# Patient Record
Sex: Female | Born: 1990
Health system: Southern US, Community
[De-identification: ages and names within clinical notes are randomized; demographics above are authoritative.]

## PROBLEM LIST (undated history)

## (undated) DIAGNOSIS — J3089 Other allergic rhinitis: Secondary | ICD-10-CM

## (undated) DIAGNOSIS — G709 Myoneural disorder, unspecified: Secondary | ICD-10-CM

## (undated) DIAGNOSIS — F419 Anxiety disorder, unspecified: Secondary | ICD-10-CM

## (undated) DIAGNOSIS — T7840XA Allergy, unspecified, initial encounter: Secondary | ICD-10-CM

## (undated) DIAGNOSIS — G129 Spinal muscular atrophy, unspecified: Secondary | ICD-10-CM

## (undated) DIAGNOSIS — L219 Seborrheic dermatitis, unspecified: Secondary | ICD-10-CM

## (undated) DIAGNOSIS — J45909 Unspecified asthma, uncomplicated: Secondary | ICD-10-CM

## (undated) DIAGNOSIS — M869 Osteomyelitis, unspecified: Secondary | ICD-10-CM

## (undated) DIAGNOSIS — F321 Major depressive disorder, single episode, moderate: Secondary | ICD-10-CM

## (undated) DIAGNOSIS — K219 Gastro-esophageal reflux disease without esophagitis: Secondary | ICD-10-CM

## (undated) HISTORY — PX: SPINE SURGERY: SHX786

## (undated) HISTORY — DX: Anxiety disorder, unspecified: F41.9

## (undated) HISTORY — DX: Allergy, unspecified, initial encounter: T78.40XA

## (undated) HISTORY — DX: Other allergic rhinitis: J30.89

## (undated) HISTORY — PX: OTHER SURGICAL HISTORY: SHX169

## (undated) HISTORY — DX: Myoneural disorder, unspecified: G70.9

## (undated) HISTORY — DX: Spinal muscular atrophy, unspecified: G12.9

## (undated) HISTORY — DX: Osteomyelitis, unspecified: M86.9

## (undated) HISTORY — PX: BONE BIOPSY: SHX375

## (undated) HISTORY — PX: COSMETIC SURGERY: SHX468

## (undated) HISTORY — DX: Seborrheic dermatitis, unspecified: L21.9

## (undated) HISTORY — DX: Gastro-esophageal reflux disease without esophagitis: K21.9

## (undated) HISTORY — DX: Major depressive disorder, single episode, moderate: F32.1

## (undated) HISTORY — DX: Unspecified asthma, uncomplicated: J45.909

---
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
## 2011-01-14 ENCOUNTER — Encounter: Payer: Self-pay | Admitting: Family Medicine

## 2011-01-23 ENCOUNTER — Other Ambulatory Visit: Payer: Self-pay | Admitting: Family Medicine

## 2011-01-27 ENCOUNTER — Ambulatory Visit: Payer: Medicaid Other | Admitting: Family Medicine

## 2011-02-06 ENCOUNTER — Telehealth: Payer: Self-pay

## 2011-02-06 ENCOUNTER — Telehealth: Payer: Self-pay | Admitting: Family Medicine

## 2011-02-06 NOTE — Telephone Encounter (Signed)
Advised urgent care. She didn't want to take her there but I told her that didn't need to wait for until next week. Either urgent care or ER. Patients mother understands

## 2011-02-09 ENCOUNTER — Ambulatory Visit (INDEPENDENT_AMBULATORY_CARE_PROVIDER_SITE_OTHER): Payer: Medicaid Other | Admitting: Family Medicine

## 2011-02-09 ENCOUNTER — Encounter: Payer: Self-pay | Admitting: Family Medicine

## 2011-02-09 VITALS — BP 110/80 | HR 107 | Temp 99.4°F | Resp 16

## 2011-02-09 DIAGNOSIS — J209 Acute bronchitis, unspecified: Secondary | ICD-10-CM

## 2011-02-09 DIAGNOSIS — J019 Acute sinusitis, unspecified: Secondary | ICD-10-CM

## 2011-02-09 DIAGNOSIS — H6690 Otitis media, unspecified, unspecified ear: Secondary | ICD-10-CM | POA: Insufficient documentation

## 2011-02-09 DIAGNOSIS — J0100 Acute maxillary sinusitis, unspecified: Secondary | ICD-10-CM | POA: Insufficient documentation

## 2011-02-09 DIAGNOSIS — J029 Acute pharyngitis, unspecified: Secondary | ICD-10-CM

## 2011-02-09 DIAGNOSIS — R51 Headache: Secondary | ICD-10-CM

## 2011-02-09 LAB — POCT RAPID STREP A (OFFICE): Rapid Strep A Screen: NEGATIVE

## 2011-02-09 MED ORDER — KETOROLAC TROMETHAMINE 60 MG/2ML IM SOLN
60.0000 mg | Freq: Once | INTRAMUSCULAR | Status: AC
  Administered 2011-02-09: 60 mg via INTRAMUSCULAR

## 2011-02-09 MED ORDER — PENICILLIN V POTASSIUM 500 MG PO TABS: 500.0000 mg | ORAL_TABLET | Freq: Three times a day (TID) | ORAL | Status: AC

## 2011-02-09 MED ORDER — IBUPROFEN 600 MG PO TABS: 600.0000 mg | ORAL_TABLET | Freq: Four times a day (QID) | ORAL | Status: AC | PRN

## 2011-02-09 NOTE — Progress Notes (Signed)
  Subjective:    Patient ID: Janet Acevedo, female    DOB: 01-24-1991, 20 y.o.   MRN: [id number]  HPI 1 week h/o head and chest congestion, strep exposure at home , and multiple other family members have been ill also Right ear pain , greater than left, reports yellow nasal drainage and yellow sputum. Reports generalized body aches, headache , chills and intermittent fever.   Review of Systems Denies chest pains, palpitations,and leg swelling Denies abdominal pain, nausea, vomiting,diarrhea or constipation.  t. Denies dysuria, frequency, hesitancy or incontinence. Generalized  joint pain, sand muscle aches. 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 Denies seizure, numbness, or tingling. Denies depression, anxiety or insomnia. Denies skin break down or rash.        Objective:   Physical Exam    Patient alert and oriented and in no Cardiopulmonary distress.Ill appearing  HEENT: No facial asymmetry, EOMI, frontal and maxillary sinus tenderness, TM's erythematous on right , Oropharynx  Erythematous and moist.  Neck supple , bilateral ant cervical adenopathy.  Chest: decreased air entry, scattered crackles and few wheezes  CVS: S1, S2 no murmurs, no S3.  ABD: Soft non tender. Bowel sounds normal.  Ext: No edema  MS: decreased ROM spine, shoulders, hips and knees.  Skin: Intact, no ulcerations or rash noted.  .     Assessment & Plan:

## 2011-02-09 NOTE — Patient Instructions (Addendum)
F/U   In  6 weeks  You have antibiotics sent in for sinusitis and bronchitis. Please take the entire course, call if you have problems  Take sudafed one twice daily for the next 5 days, also  ibufrofen 600mg  one 3 times daily as needed for headache and ear pain. Toradol injection today

## 2011-02-09 NOTE — Telephone Encounter (Signed)
Work in appt this pm pls before 3:30 if possible

## 2011-02-14 NOTE — Assessment & Plan Note (Signed)
Antibiotics prescribed, and toradol ordered for headache and ear pain

## 2011-02-14 NOTE — Assessment & Plan Note (Signed)
Pt with acute ear infection, toradol for pain and antibiotic course to be taken in it's entirety, decongestants as needed

## 2011-02-14 NOTE — Assessment & Plan Note (Signed)
Antibiotics prescribed 

## 2011-02-16 ENCOUNTER — Other Ambulatory Visit: Payer: Self-pay | Admitting: Family Medicine

## 2011-03-17 ENCOUNTER — Other Ambulatory Visit: Payer: Self-pay | Admitting: Family Medicine

## 2011-03-17 ENCOUNTER — Telehealth: Payer: Self-pay | Admitting: Family Medicine

## 2011-03-17 DIAGNOSIS — M6250 Muscle wasting and atrophy, not elsewhere classified, unspecified site: Secondary | ICD-10-CM

## 2011-03-17 NOTE — Telephone Encounter (Signed)
Called back to get more info but she wasn't home. Asking for rx for AFO braces (ankle foot orthotic) for Janet Acevedo

## 2011-03-17 NOTE — Telephone Encounter (Signed)
This pt needs ortho referral for foot brace to be ordered, I do not order, pls refer to dr Romeo Apple, thanks, let her know I tried to call and tell her this

## 2011-03-20 ENCOUNTER — Telehealth: Payer: Self-pay | Admitting: Family Medicine

## 2011-03-20 NOTE — Telephone Encounter (Signed)
Script written , mother aware  And will collect

## 2011-03-23 ENCOUNTER — Other Ambulatory Visit: Payer: Self-pay | Admitting: Family Medicine

## 2011-03-25 ENCOUNTER — Ambulatory Visit: Payer: Medicaid Other | Admitting: Family Medicine

## 2011-03-27 ENCOUNTER — Encounter: Payer: Self-pay | Admitting: Family Medicine

## 2011-04-02 ENCOUNTER — Encounter: Payer: Self-pay | Admitting: Family Medicine

## 2011-04-03 ENCOUNTER — Ambulatory Visit (INDEPENDENT_AMBULATORY_CARE_PROVIDER_SITE_OTHER): Payer: Medicaid Other | Admitting: Family Medicine

## 2011-04-03 ENCOUNTER — Encounter: Payer: Self-pay | Admitting: Family Medicine

## 2011-04-03 VITALS — BP 120/80 | HR 85 | Resp 16

## 2011-04-03 DIAGNOSIS — G129 Spinal muscular atrophy, unspecified: Secondary | ICD-10-CM

## 2011-04-03 DIAGNOSIS — Z23 Encounter for immunization: Secondary | ICD-10-CM

## 2011-04-03 DIAGNOSIS — E669 Obesity, unspecified: Secondary | ICD-10-CM

## 2011-04-03 DIAGNOSIS — K219 Gastro-esophageal reflux disease without esophagitis: Secondary | ICD-10-CM

## 2011-04-03 DIAGNOSIS — G128 Other spinal muscular atrophies and related syndromes: Secondary | ICD-10-CM

## 2011-04-03 DIAGNOSIS — J45909 Unspecified asthma, uncomplicated: Secondary | ICD-10-CM

## 2011-04-03 MED ORDER — INFLUENZA VAC TYPES A & B PF IM SUSP: 0.5000 mL | Freq: Once | INTRAMUSCULAR | Status: DC

## 2011-04-03 NOTE — Assessment & Plan Note (Signed)
Unchanged, wheelchair dependent.Followed at [facility] in the neurology clinic

## 2011-04-03 NOTE — Assessment & Plan Note (Signed)
Controlled, no change in medication  

## 2011-04-03 NOTE — Progress Notes (Signed)
  Subjective:    Patient ID: Janet Acevedo, female    DOB: 1991/07/04, 20 y.o.   MRN: [id number]  HPI The PT is here for follow up and re-evaluation of chronic medical conditions, medication management and review of any available recent lab and radiology data.  Preventive health is updated, specifically  Cancer screening and Immunization.   Questions or concerns regarding consultations or procedures which the PT has had in the interim are  addressed. The PT denies any adverse reactions to current medications since the last visit.  There are no new concerns.  There are no specific complaints. She has not changed her diet and still is not eating vegetables as she has been advised       Review of Systems Denies recent fever or chills. Denies sinus pressure, nasal congestion, ear pain or sore throat. Denies chest congestion, productive cough or wheezing. Denies chest pains, palpitations and leg swelling Denies abdominal pain, nausea, vomiting,diarrhea or constipation.   Denies dysuria, frequency, hesitancy or incontinence. Denies joint pain,severe  limitation in mobility from genetic disease Denies headaches, seizures, numbness, or tingling. Denies depression, anxiety or insomnia. Denies skin break down or rash.        Objective:   Physical Exam Patient alert and oriented and in no cardiopulmonary distress.  HEENT: No facial asymmetry, EOMI, no sinus tenderness,  oropharynx pink and moist.  Neck supple no adenopathy.  Chest: Clear to auscultation bilaterally.  CVS: S1, S2 no murmurs, no S3.  ABD: Soft non tender. Bowel sounds normal.  Ext: No edema  MS: decreased  ROM spine, shoulders, hips and knees.  Skin: Intact, no ulcerations or rash noted.  Psych: Good eye contact, normal affect. Memory intact not anxious or depressed appearing.  CNS: CN 2-12 intact,sensation normal throughout.Reduced power in all extremities        Assessment & Plan:

## 2011-04-03 NOTE — Patient Instructions (Addendum)
F/U in 3.5 months   Labs as soon as possible  Flu vaccine today.  Pls eat more fruit, vegetable and drink water, remember you need to lose weight for improved health

## 2011-04-03 NOTE — Assessment & Plan Note (Signed)
Unable to weight pt, however counseled again re need to change diet to facilitate weight loss

## 2011-04-16 ENCOUNTER — Other Ambulatory Visit: Payer: Self-pay | Admitting: Family Medicine

## 2011-04-22 ENCOUNTER — Other Ambulatory Visit: Payer: Self-pay | Admitting: Family Medicine

## 2011-04-22 ENCOUNTER — Telehealth: Payer: Self-pay | Admitting: Family Medicine

## 2011-04-22 MED ORDER — AZELASTINE HCL 0.15 % NA SOLN: 2.0000 | Freq: Two times a day (BID) | NASAL | Status: DC

## 2011-04-22 NOTE — Telephone Encounter (Signed)
Her mother aware fexofenadine and astelin sent in

## 2011-04-22 NOTE — Telephone Encounter (Signed)
astelin has been prescribed, pls let pt know

## 2011-04-23 ENCOUNTER — Other Ambulatory Visit: Payer: Self-pay | Admitting: Family Medicine

## 2011-04-23 ENCOUNTER — Telehealth: Payer: Self-pay

## 2011-04-23 MED ORDER — OLOPATADINE HCL 0.6 % NA SOLN: NASAL | Status: DC

## 2011-04-23 NOTE — Telephone Encounter (Signed)
patanase is sent i please let her know. Also let her know for astelin, she needs to hold down her head and spray towrds the ear, or else she will gag

## 2011-04-24 NOTE — Telephone Encounter (Signed)
Patient aware.

## 2011-05-12 ENCOUNTER — Telehealth: Payer: Self-pay | Admitting: Family Medicine

## 2011-05-14 ENCOUNTER — Telehealth: Payer: Self-pay | Admitting: Family Medicine

## 2011-05-14 NOTE — Telephone Encounter (Signed)
Previous message on this patient regarding same thing

## 2011-05-14 NOTE — Telephone Encounter (Signed)
[facility] since she has tried the alternatives and wants to try PA again

## 2011-05-15 NOTE — Telephone Encounter (Signed)
Med approved. Mother aware

## 2011-05-19 ENCOUNTER — Telehealth: Payer: Self-pay | Admitting: Family Medicine

## 2011-05-19 NOTE — Telephone Encounter (Signed)
Please have patient schedule appt.

## 2011-05-20 ENCOUNTER — Telehealth: Payer: Self-pay | Admitting: Family Medicine

## 2011-05-20 NOTE — Telephone Encounter (Signed)
Called pt and she wants to talk with dr. Lodema Hong

## 2011-05-20 NOTE — Telephone Encounter (Signed)
4 day h/o had and chest congestion.  Pls work in pt on 10/25 somewhere, thanks, let Mother know

## 2011-05-21 ENCOUNTER — Encounter: Payer: Self-pay | Admitting: Family Medicine

## 2011-05-21 ENCOUNTER — Ambulatory Visit (INDEPENDENT_AMBULATORY_CARE_PROVIDER_SITE_OTHER): Payer: Medicaid Other | Admitting: Family Medicine

## 2011-05-21 VITALS — BP 110/80 | HR 84 | Temp 99.5°F | Resp 16

## 2011-05-21 DIAGNOSIS — J309 Allergic rhinitis, unspecified: Secondary | ICD-10-CM

## 2011-05-21 DIAGNOSIS — K219 Gastro-esophageal reflux disease without esophagitis: Secondary | ICD-10-CM

## 2011-05-21 DIAGNOSIS — J019 Acute sinusitis, unspecified: Secondary | ICD-10-CM

## 2011-05-21 DIAGNOSIS — J45909 Unspecified asthma, uncomplicated: Secondary | ICD-10-CM

## 2011-05-21 MED ORDER — SULFAMETHOXAZOLE-TRIMETHOPRIM 800-160 MG PO TABS: 1.0000 | ORAL_TABLET | Freq: Two times a day (BID) | ORAL | Status: AC

## 2011-05-21 NOTE — Patient Instructions (Signed)
F/U as before.  You have septra and tessalon perles sent to your pharmacy for sinusitis.  No other med changes

## 2011-05-21 NOTE — Progress Notes (Signed)
  Subjective:    Patient ID: Janet Acevedo, female    DOB: 02-05-1991, 20 y.o.   MRN: [id number]  HPI 4 day h/o increased nasal congestion, chills, yellow drainage, no documented fever. Denies productive cough. Allergies and GERD are currently controlled.  Review of Systems See HPI  Denies chest congestion, productive cough or wheezing. Denies chest pains, palpitations and leg swelling Denies abdominal pain, nausea, vomiting,diarrhea or constipation.   Denies dysuria, frequency, hesitancy or incontinence.  Denies headaches, seizures, numbness, or tingling. Denies depression, anxiety or insomnia. Denies skin break down or rash.        Objective:   Physical Exam Patient alert and oriented and in no cardiopulmonary distress.  HEENT: No facial asymmetry, EOMI, frontal and maxillarly sinus tenderness,  oropharynx pink and moist.  Neck supple no adenopathy.  Chest: Clear to auscultation bilaterally.decreased air entry throughout  CVS: S1, S2 no murmurs, no S3.  ABD: Soft non tender. Bowel sounds normal.  Ext: No edema  MS: decreased  ROM spine, shoulders, hips and knees.  Skin: Intact, no ulcerations or rash noted.  Psych: Good eye contact, normal affect. Memory intact not anxious or depressed appearing.  CNS: CN 2-12 intact, power, tone and sensation normal throughout.        Assessment & Plan:

## 2011-05-21 NOTE — Telephone Encounter (Signed)
Ov today.

## 2011-05-24 NOTE — Assessment & Plan Note (Signed)
Controlled, no change in medication  

## 2011-05-24 NOTE — Assessment & Plan Note (Signed)
Stable  At this time

## 2011-05-24 NOTE — Assessment & Plan Note (Signed)
Antibiotic prscribed

## 2011-07-02 ENCOUNTER — Telehealth: Payer: Self-pay | Admitting: Family Medicine

## 2011-07-03 ENCOUNTER — Other Ambulatory Visit: Payer: Self-pay | Admitting: Family Medicine

## 2011-07-03 MED ORDER — CETIRIZINE HCL 10 MG PO CHEW: 10.0000 mg | CHEWABLE_TABLET | Freq: Every day | ORAL | Status: DC

## 2011-07-03 NOTE — Telephone Encounter (Signed)
Called mother and she stated that is is the Allegra that is not covered and that she has been on both Claritin and Zyrtec in the past and they did not work.  Please advise.

## 2011-07-03 NOTE — Telephone Encounter (Signed)
Send in for singulair one daily if her mother agrees to try this and see if the ins will cover #30- refill 3

## 2011-07-03 NOTE — Telephone Encounter (Signed)
Spoke with mother and she stated that she would just by the Allegra.

## 2011-07-03 NOTE — Telephone Encounter (Signed)
If not cobering singulair , most affordable option is to pay out of pocket for prescribed loratidine (claritin) or zyrtec (certrizine) this is $4 for 30 days , send whichever she choses please each is a 10mg  tablet #30 refill 3

## 2011-07-06 ENCOUNTER — Other Ambulatory Visit: Payer: Self-pay | Admitting: Family Medicine

## 2011-07-27 ENCOUNTER — Encounter: Payer: Self-pay | Admitting: Family Medicine

## 2011-08-06 ENCOUNTER — Encounter: Payer: Self-pay | Admitting: Family Medicine

## 2011-08-10 ENCOUNTER — Encounter: Payer: Self-pay | Admitting: Family Medicine

## 2011-08-10 ENCOUNTER — Ambulatory Visit (INDEPENDENT_AMBULATORY_CARE_PROVIDER_SITE_OTHER): Payer: Medicaid Other | Admitting: Family Medicine

## 2011-08-10 VITALS — BP 112/80 | HR 83 | Resp 16

## 2011-08-10 DIAGNOSIS — E559 Vitamin D deficiency, unspecified: Secondary | ICD-10-CM

## 2011-08-10 DIAGNOSIS — J45909 Unspecified asthma, uncomplicated: Secondary | ICD-10-CM

## 2011-08-10 DIAGNOSIS — J309 Allergic rhinitis, unspecified: Secondary | ICD-10-CM

## 2011-08-10 DIAGNOSIS — R7301 Impaired fasting glucose: Secondary | ICD-10-CM

## 2011-08-10 DIAGNOSIS — K219 Gastro-esophageal reflux disease without esophagitis: Secondary | ICD-10-CM

## 2011-08-10 DIAGNOSIS — E669 Obesity, unspecified: Secondary | ICD-10-CM

## 2011-08-10 DIAGNOSIS — Z13220 Encounter for screening for lipoid disorders: Secondary | ICD-10-CM

## 2011-08-10 DIAGNOSIS — R5381 Other malaise: Secondary | ICD-10-CM

## 2011-08-10 DIAGNOSIS — J302 Other seasonal allergic rhinitis: Secondary | ICD-10-CM

## 2011-08-10 DIAGNOSIS — G128 Other spinal muscular atrophies and related syndromes: Secondary | ICD-10-CM

## 2011-08-10 NOTE — Assessment & Plan Note (Signed)
Controlled, no change in medication  

## 2011-08-10 NOTE — Patient Instructions (Signed)
F/u in 3.5 months.  Call if you need me  Labs today.  Pls work on changing foood choices   More vegetable and fruit.   No changes in your medications.  I am happy that you are doing well and that you are back in school   All the Best!!!

## 2011-08-10 NOTE — Assessment & Plan Note (Signed)
Unchanged, marked limitation in power of all 4 extremities

## 2011-08-10 NOTE — Assessment & Plan Note (Signed)
Stable and controlled , no recent flare 

## 2011-08-10 NOTE — Assessment & Plan Note (Signed)
Unchanged, counseled to reduce fast foods and unhealthy eating to help with weight control and improved health

## 2011-08-10 NOTE — Progress Notes (Signed)
  Subjective:    Patient ID: Janet Acevedo, female    DOB: 01/10/1991, 21 y.o.   MRN: [id number]  HPI The PT is here for follow up and re-evaluation of chronic medical conditions, medication management and review of any available recent lab and radiology data.  Preventive health is updated, specifically  Cancer screening and Immunization.   Questions or concerns regarding consultations or procedures which the PT has had in the interim are  addressed. The PT denies any adverse reactions to current medications since the last visit.  There are no new concerns.  There are no specific complaints .States she is doing well overall. Did not attend school last semester by choice, her aunt was very ill at the time      Review of Systems See HPI Denies recent fever or chills. Denies sinus pressure, nasal congestion, ear pain or sore throat. Denies chest congestion, productive cough or wheezing. Denies chest pains, palpitations and leg swelling Denies abdominal pain, nausea, vomiting,diarrhea or constipation.   Denies dysuria, frequency, hesitancy or incontinence. Denies headaches, seizures, numbness, or tingling. Denies depression, anxiety or insomnia. Denies skin break down or rash.        Objective:   Physical Exam Patient alert and oriented and in no cardiopulmonary distress.Wheelchair restricted  HEENT: No facial asymmetry, EOMI, no sinus tenderness,  oropharynx pink and moist.  Neck decreased ROM, no adenopathy.  Chest: Clear to auscultation bilaterally.  CVS: S1, S2 no murmurs, no S3.  ABD: Soft non tender. Bowel sounds normal.  Ext: No edema  MS: decreased  ROM spine, shoulders, hips and knees.  Skin: Intact, no ulcerations or rash noted.  Psych: Good eye contact, normal affect. Memory intact not anxious or depressed appearing.  CNS: CN 2-12 intact, Reduced power in upper and lower extremities due to muscular atrophy      Assessment & Plan:

## 2011-08-11 LAB — CBC WITH DIFFERENTIAL/PLATELET
Basophils Relative: 0 % (ref 0–1)
Eosinophils Absolute: 0.3 10*3/uL (ref 0.0–0.7)
Eosinophils Relative: 3 % (ref 0–5)
MCH: 24.6 pg — ABNORMAL LOW (ref 26.0–34.0)
MCHC: 29.9 g/dL — ABNORMAL LOW (ref 30.0–36.0)
MCV: 82.2 fL (ref 78.0–100.0)
Monocytes Relative: 6 % (ref 3–12)
Neutrophils Relative %: 60 % (ref 43–77)
Platelets: 306 10*3/uL (ref 150–400)

## 2011-08-11 LAB — BASIC METABOLIC PANEL
BUN: 9 mg/dL (ref 6–23)
Calcium: 9.2 mg/dL (ref 8.4–10.5)
Glucose, Bld: 80 mg/dL (ref 70–99)
Potassium: 4 mEq/L (ref 3.5–5.3)

## 2011-08-11 LAB — LIPID PANEL
HDL: 34 mg/dL — ABNORMAL LOW (ref >39)
LDL Cholesterol: 127 mg/dL — ABNORMAL HIGH (ref 0–99)
VLDL: 12 mg/dL (ref 0–40)

## 2011-08-11 LAB — TSH: TSH: 1.108 u[IU]/mL (ref 0.350–4.500)

## 2011-08-11 LAB — IRON: Iron: 27 ug/dL — ABNORMAL LOW (ref 42–145)

## 2011-08-12 ENCOUNTER — Other Ambulatory Visit: Payer: Self-pay | Admitting: Family Medicine

## 2011-08-12 ENCOUNTER — Telehealth: Payer: Self-pay | Admitting: Family Medicine

## 2011-08-12 NOTE — Telephone Encounter (Signed)
Unsure what this is ask her to have the pharmacy send for a refill , so I can get clarification, I will refill

## 2011-08-12 NOTE — Telephone Encounter (Signed)
Called and spoke with Dorathy Kinsman and she stated that she would have pharmacy to send refill request

## 2011-08-12 NOTE — Telephone Encounter (Signed)
Mother called and wants to know if you will refill Bepreve for Janet Acevedo.  The med was prescribed by Dr. Willa Rough but the mother wants to know if you will refill.

## 2011-08-24 ENCOUNTER — Other Ambulatory Visit: Payer: Self-pay | Admitting: Family Medicine

## 2011-08-26 ENCOUNTER — Telehealth: Payer: Self-pay

## 2011-08-26 ENCOUNTER — Other Ambulatory Visit: Payer: Self-pay | Admitting: Family Medicine

## 2011-08-26 MED ORDER — AZITHROMYCIN 250 MG PO TABS: ORAL_TABLET | ORAL | Status: AC

## 2011-08-26 MED ORDER — BENZONATATE 100 MG PO CAPS: 100.0000 mg | ORAL_CAPSULE | Freq: Four times a day (QID) | ORAL | Status: DC | PRN

## 2011-08-26 NOTE — Telephone Encounter (Signed)
Tessalon perles and z pack sent to pharmacy, Mom aware

## 2011-08-26 NOTE — Telephone Encounter (Signed)
Now Helena is sick. Started feeling bad Saturday, nasal congestion, cough, itchy eyes and throat, no fever, some wheezig at night and some yellow phlegm production. Aemilia Dedrick was just there

## 2011-08-29 ENCOUNTER — Telehealth: Payer: Self-pay | Admitting: Family Medicine

## 2011-08-29 MED ORDER — PREDNISONE 20 MG PO TABS: ORAL_TABLET | ORAL | Status: DC

## 2011-08-29 NOTE — Telephone Encounter (Signed)
I spoke with Ms. Janet Acevedo's mother Seward Grater. Janet Acevedo is being treated for URI and asthma, started on Z pak 2 days ago. She has more wheezing and cough. Her breathing is okay. She asked for prednisone. As she was recently treated, no red flags per mother, she is otherwise stable. Prednisone 40mg  x 5 days called into pharmacy.

## 2011-09-03 ENCOUNTER — Telehealth: Payer: Self-pay | Admitting: Family Medicine

## 2011-09-03 MED ORDER — PREDNISONE (PAK) 5 MG PO TABS: 5.0000 mg | ORAL_TABLET | ORAL | Status: DC

## 2011-09-03 NOTE — Telephone Encounter (Signed)
Still reports a lot of ear pressure and nasal drainage, no color or fever or chills will send in a dose [pack, she is aware

## 2011-09-08 ENCOUNTER — Other Ambulatory Visit: Payer: Self-pay | Admitting: Family Medicine

## 2011-09-25 ENCOUNTER — Ambulatory Visit (INDEPENDENT_AMBULATORY_CARE_PROVIDER_SITE_OTHER): Payer: Medicaid Other | Admitting: Family Medicine

## 2011-09-25 ENCOUNTER — Other Ambulatory Visit: Payer: Self-pay | Admitting: Family Medicine

## 2011-09-25 ENCOUNTER — Encounter: Payer: Self-pay | Admitting: Family Medicine

## 2011-09-25 VITALS — BP 114/80 | HR 113 | Temp 101.2°F | Resp 16

## 2011-09-25 DIAGNOSIS — J45909 Unspecified asthma, uncomplicated: Secondary | ICD-10-CM

## 2011-09-25 DIAGNOSIS — Z3049 Encounter for surveillance of other contraceptives: Secondary | ICD-10-CM

## 2011-09-25 DIAGNOSIS — J209 Acute bronchitis, unspecified: Secondary | ICD-10-CM

## 2011-09-25 DIAGNOSIS — J111 Influenza due to unidentified influenza virus with other respiratory manifestations: Secondary | ICD-10-CM

## 2011-09-25 MED ORDER — CEFTRIAXONE SODIUM 500 MG IJ SOLR
500.0000 mg | Freq: Once | INTRAMUSCULAR | Status: AC
  Administered 2011-09-25: 500 mg via INTRAMUSCULAR

## 2011-09-25 MED ORDER — OSELTAMIVIR PHOSPHATE 75 MG PO CAPS: 75.0000 mg | ORAL_CAPSULE | Freq: Two times a day (BID) | ORAL | Status: AC

## 2011-09-25 MED ORDER — AZITHROMYCIN 250 MG PO TABS: ORAL_TABLET | ORAL | Status: AC

## 2011-09-25 MED ORDER — METHYLPREDNISOLONE ACETATE PF 80 MG/ML IJ SUSP
80.0000 mg | Freq: Once | INTRAMUSCULAR | Status: AC
  Administered 2011-09-25: 80 mg via INTRAMUSCULAR

## 2011-09-25 MED ORDER — PREDNISONE (PAK) 5 MG PO TABS: 5.0000 mg | ORAL_TABLET | ORAL | Status: DC

## 2011-09-25 NOTE — Progress Notes (Signed)
Addended by: Abner Greenspan on: 09/25/2011 01:04 PM   Modules accepted: Orders

## 2011-09-25 NOTE — Progress Notes (Signed)
  Subjective:    Patient ID: Janet Acevedo, female    DOB: June 22, 1991, 21 y.o.   MRN: [id number]  HPI 2 day h/o acute fever, chills, body aches , sore throat and increased  Chest congestion with wheezing and cough.Mother had to do a breathing treatment yesterday twice Increased nasal drainage and sinus pressure, drainage is clear   Review of Systems See HPI Denies chest pains, palpitations and leg swelling Denies abdominal pain, nausea, vomiting,diarrhea or constipation.   Denies dysuria, frequency, hesitancy or incontinence. Denies joint pain, swelling and limitation in mobility. Denies headaches, seizures, numbness, or tingling. Denies depression, anxiety or insomnia. Denies skin break down or rash.        Objective:   Physical Exam Patient alert and oriented and in no cardiopulmonary distress.Ill appearing  HEENT: No facial asymmetry, EOMI, no sinus tenderness,  oropharynx pink and moist.  Neck supple no adenopathy.  Chest: Decreased air entry, scattered crackles and wheezes  CVS: S1, S2 no murmurs, no S3.  ABD: Soft non tender. Bowel sounds normal.  Ext: No edema  MS: decreased  ROM spine, shoulders, hips and knees.  Skin: Intact, no ulcerations or rash noted.  Psych: Good eye contact, normal affect. Memory intact not anxious or depressed appearing.  CNS: CN 2-12 intact,         Assessment & Plan:

## 2011-09-25 NOTE — Patient Instructions (Addendum)
F/u as before.  You are beiong treated for acute bronchitis and influenza.  You will get Rocephin 500mg  and depo medrol 80mg  IM in the office. Tamiflu, a prednisone dose pack and a zpack are sent to your pharmacy.  You will be given handouts on influenza and acute bronchitis.  I hope that you feel better soon, call if not

## 2011-09-25 NOTE — Assessment & Plan Note (Signed)
Rocephin administered and z pack prescribed

## 2011-09-25 NOTE — Assessment & Plan Note (Signed)
Acute influenza, tamiflu presscribed

## 2011-09-25 NOTE — Assessment & Plan Note (Signed)
Increased symptoms, depo medrol in office and prednisone dose pack

## 2011-09-30 ENCOUNTER — Other Ambulatory Visit: Payer: Self-pay | Admitting: Family Medicine

## 2011-09-30 ENCOUNTER — Other Ambulatory Visit: Payer: Self-pay

## 2011-10-01 ENCOUNTER — Other Ambulatory Visit: Payer: Self-pay | Admitting: Family Medicine

## 2011-10-01 ENCOUNTER — Telehealth: Payer: Self-pay | Admitting: Family Medicine

## 2011-10-01 DIAGNOSIS — D72829 Elevated white blood cell count, unspecified: Secondary | ICD-10-CM

## 2011-10-01 DIAGNOSIS — J40 Bronchitis, not specified as acute or chronic: Secondary | ICD-10-CM

## 2011-10-01 DIAGNOSIS — J209 Acute bronchitis, unspecified: Secondary | ICD-10-CM

## 2011-10-01 DIAGNOSIS — R5381 Other malaise: Secondary | ICD-10-CM

## 2011-10-01 MED ORDER — PENICILLIN V POTASSIUM 500 MG PO TABS: 500.0000 mg | ORAL_TABLET | Freq: Three times a day (TID) | ORAL | Status: AC

## 2011-10-01 NOTE — Telephone Encounter (Signed)
Advise penicillin sent in CXR ordered, needs to get today at [facility], also please order cbc and diff and chem 7 , needs this today also if possible and needs to submit sputum for c/s,

## 2011-10-01 NOTE — Telephone Encounter (Signed)
Called patient and left message for them to return call at the office   

## 2011-10-01 NOTE — Telephone Encounter (Signed)
Anything else you want her to do?

## 2011-10-05 NOTE — Telephone Encounter (Signed)
Spoke with mother and Patient on Thursday. Advised of everything. Never came to collect lab or specimen order

## 2011-10-10 ENCOUNTER — Other Ambulatory Visit: Payer: Self-pay | Admitting: Family Medicine

## 2011-10-20 ENCOUNTER — Encounter: Payer: Self-pay | Admitting: Family Medicine

## 2011-10-20 ENCOUNTER — Other Ambulatory Visit: Payer: Self-pay | Admitting: Family Medicine

## 2011-10-20 ENCOUNTER — Ambulatory Visit (HOSPITAL_COMMUNITY)
Admission: RE | Admit: 2011-10-20 | Discharge: 2011-10-20 | Disposition: A | Discharge status: Home / Self Care | Payer: Medicaid Other | Source: Ambulatory Visit | Attending: Family Medicine | Admitting: Family Medicine

## 2011-10-20 ENCOUNTER — Ambulatory Visit (INDEPENDENT_AMBULATORY_CARE_PROVIDER_SITE_OTHER): Payer: Medicaid Other | Admitting: Family Medicine

## 2011-10-20 VITALS — BP 120/82 | HR 126 | Temp 99.6°F | Resp 16

## 2011-10-20 DIAGNOSIS — J40 Bronchitis, not specified as acute or chronic: Secondary | ICD-10-CM

## 2011-10-20 DIAGNOSIS — R0989 Other specified symptoms and signs involving the circulatory and respiratory systems: Secondary | ICD-10-CM | POA: Insufficient documentation

## 2011-10-20 DIAGNOSIS — J45909 Unspecified asthma, uncomplicated: Secondary | ICD-10-CM

## 2011-10-20 DIAGNOSIS — Z981 Arthrodesis status: Secondary | ICD-10-CM | POA: Insufficient documentation

## 2011-10-20 DIAGNOSIS — J209 Acute bronchitis, unspecified: Secondary | ICD-10-CM

## 2011-10-20 DIAGNOSIS — J189 Pneumonia, unspecified organism: Secondary | ICD-10-CM

## 2011-10-20 DIAGNOSIS — M4000 Postural kyphosis, site unspecified: Secondary | ICD-10-CM | POA: Insufficient documentation

## 2011-10-20 MED ORDER — LEVOFLOXACIN 750 MG PO TABS: 750.0000 mg | ORAL_TABLET | Freq: Every day | ORAL | Status: AC

## 2011-10-20 MED ORDER — HYDROCOD POLST-CHLORPHEN POLST 10-8 MG/5ML PO LQCR: 5.0000 mL | Freq: Two times a day (BID) | ORAL | Status: DC

## 2011-10-20 MED ORDER — ONDANSETRON HCL 4 MG PO TABS: 4.0000 mg | ORAL_TABLET | Freq: Three times a day (TID) | ORAL | Status: DC | PRN

## 2011-10-20 NOTE — Patient Instructions (Signed)
I am treating for pneumonia Start the new antibiotics- Levaquin I will send your records to your lung doctor Zofran as needed for nausea Push the fluids Continue your nebs I will call about the chest x-ray

## 2011-10-20 NOTE — Assessment & Plan Note (Addendum)
I am concerned for community-acquired pneumonia with her prolonged symptoms despite antibiotics. She will have blood work obtained. Sputum cultures sent. She had a chest x-ray done earlier today which I will followup on this evening. I will increase her to Levaquin. She has completed course of Tamiflu for influenza. She does not look very toxic-appearing therefore I believe outpatient workup can be done. I will fax a note to her pulmonologist to see if anything needs to be changed.  I did recommend probiotics because of her multiple courses of antibiotics   I spoke with pt mother given x-ray results, I think she should still take the course of antibiotics and we will await the sputum  Culture and labs. She agreed and voiced understanding

## 2011-10-20 NOTE — Assessment & Plan Note (Signed)
She is s/p steroids from March 1st, this does not appear to be asthma exacerbation at this time

## 2011-10-20 NOTE — Progress Notes (Signed)
  Subjective:    Patient ID: Janet Acevedo, female    DOB: Jan 17, 1991, 21 y.o.   MRN: [id number]  HPI Patient presents with cough with production, nasal congestion fever and wheezing. She states that all these symptoms started approximately 3 1/2weeks ago when she was seen by her PCP and prescribe Tamiflu, Rocephin and azithromycin to cover bronchitis and influenza. She did improve however all of her symptoms reoccurred about a week later. She was being given penicillin however this made her nauseous. Her fever was 10 73F this morning. She is able to cough up the sputum however with her muscular atrophy disorder she does have difficulty clearing her secretions. She does have a pulmonologist at [facility] Dr.Carlson. Positive sick contact with her mother who is also had an upper respiratory infection. She has been using her normal pulmonary toilet which is albuterol nebs and Advair along with Singulair.   Review of Systems  GEN- + fatigue, +fever, weight loss,weakness, recent illness HEENT- denies eye drainage, change in vision, nasal discharge, CVS- denies chest pain, palpitations RESP- denies SOB,+ cough, +wheeze ABD- + N/ V, change in stools, abd pain GU- denies dysuria, hematuria, dribbling, incontinence MSK- denies joint pain, +muscle aches, injury Neuro- denies headache, dizziness, syncope, seizure activity       Objective:   Physical Exam GEN- NAD, alert and oriented x3, sitting in wheelchair, short stature, non toxic appearing, afebrile HEENT- PERRL, EOMI, non injected sclera, pink conjunctiva, MMM, oropharynx clear, TM clear bilat , no maxillary sinus pressure Neck- Supple, mild shotty LAD CVS- Tachycardic, no murmur RESP-course Rhonchi in bilat lobes, normal WOB, no wheeze heard, speaking in full sentences, no retractions ABD-NABS,soft, NT,ND EXT- No edema Pulses- Radial, DP- 2+ Neuro-decreased muscular tone, legs in braces with contractures       Assessment &  Plan:

## 2011-10-21 LAB — CBC WITH DIFFERENTIAL/PLATELET
Eosinophils Absolute: 0 10*3/uL (ref 0.0–0.7)
Hemoglobin: 11.6 g/dL — ABNORMAL LOW (ref 12.0–15.0)
Lymphocytes Relative: 2 % — ABNORMAL LOW (ref 12–46)
Lymphs Abs: 0.3 10*3/uL — ABNORMAL LOW (ref 0.7–4.0)
MCH: 25.6 pg — ABNORMAL LOW (ref 26.0–34.0)
MCV: 82.2 fL (ref 78.0–100.0)
Monocytes Relative: 7 % (ref 3–12)
Neutrophils Relative %: 91 % — ABNORMAL HIGH (ref 43–77)
RBC: 4.54 MIL/uL (ref 3.87–5.11)
WBC: 13.9 10*3/uL — ABNORMAL HIGH (ref 4.0–10.5)

## 2011-10-21 LAB — BASIC METABOLIC PANEL
CO2: 18 mEq/L — ABNORMAL LOW (ref 19–32)
Calcium: 9.4 mg/dL (ref 8.4–10.5)
Chloride: 102 mEq/L (ref 96–112)
Glucose, Bld: 96 mg/dL (ref 70–99)
Sodium: 141 mEq/L (ref 135–145)

## 2011-10-24 LAB — RESPIRATORY CULTURE OR RESPIRATORY AND SPUTUM CULTURE

## 2011-10-30 NOTE — Telephone Encounter (Signed)
Addended by: Kandis Fantasia B on: 10/30/2011 03:06 PM   Modules accepted: Orders

## 2011-11-03 ENCOUNTER — Telehealth: Payer: Self-pay | Admitting: Family Medicine

## 2011-11-03 LAB — CBC WITH DIFFERENTIAL/PLATELET
Basophils Relative: 0 % (ref 0–1)
Eosinophils Absolute: 0.6 10*3/uL (ref 0.0–0.7)
HCT: 38.6 % (ref 36.0–46.0)
Hemoglobin: 11.9 g/dL — ABNORMAL LOW (ref 12.0–15.0)
Lymphs Abs: 3.7 10*3/uL (ref 0.7–4.0)
MCH: 25.6 pg — ABNORMAL LOW (ref 26.0–34.0)
MCHC: 30.8 g/dL (ref 30.0–36.0)
Monocytes Absolute: 1.6 10*3/uL — ABNORMAL HIGH (ref 0.1–1.0)
Monocytes Relative: 13 % — ABNORMAL HIGH (ref 3–12)
Neutrophils Relative %: 53 % (ref 43–77)
RBC: 4.65 MIL/uL (ref 3.87–5.11)

## 2011-11-03 NOTE — Telephone Encounter (Signed)
Spoke with mother and she is aware that she should contact the pulmonary specialist for an appointment and that she needs to continue with the followup labs.

## 2011-11-03 NOTE — Telephone Encounter (Signed)
Addended by: Kandis Fantasia B on: 11/03/2011 03:51 PM   Modules accepted: Orders

## 2011-11-05 ENCOUNTER — Telehealth: Payer: Self-pay | Admitting: Family Medicine

## 2011-11-05 MED ORDER — FLUTICASONE-SALMETEROL 115-21 MCG/ACT IN AERO: 2.0000 | INHALATION_SPRAY | Freq: Two times a day (BID) | RESPIRATORY_TRACT | Status: DC

## 2011-11-05 MED ORDER — LEVOFLOXACIN 750 MG PO TABS: 750.0000 mg | ORAL_TABLET | Freq: Every day | ORAL | Status: AC

## 2011-11-05 NOTE — Telephone Encounter (Signed)
I spoke with patient's pulmonologist at [facility] Dr. Lisette Grinder. He advised that we change her inhaled corticosteroid to Advair 115/21 mg MDI with AeroChamber and she's had multiple infections. They do have a followup appointment already scheduled in May  I spoke with patient's mother. She continues to have congestion with production and some sinus drainage. Her fever has now resolved. She does remember when she was this bad before she was on antibiotics for 3 weeks straight for chronic sinusitis. I informedHer of conversation with her pulmonologist. I will switch her to Advair she'll also be given another course of the Levaquin.

## 2011-11-12 ENCOUNTER — Telehealth: Payer: Self-pay | Admitting: Family Medicine

## 2011-11-12 NOTE — Telephone Encounter (Signed)
I spoke with pt Mother, appt rescheduled to April 24th at 11:30am with Dr. Avel Sensor We may need to provide a note for her school for that day

## 2011-11-25 ENCOUNTER — Other Ambulatory Visit: Payer: Self-pay | Admitting: Family Medicine

## 2011-12-03 ENCOUNTER — Telehealth: Payer: Self-pay | Admitting: Family Medicine

## 2011-12-04 NOTE — Telephone Encounter (Signed)
pls find out exactly what she wants therapy for and document, I will refer when I know

## 2011-12-07 ENCOUNTER — Other Ambulatory Visit: Payer: Self-pay | Admitting: Family Medicine

## 2011-12-07 DIAGNOSIS — G129 Spinal muscular atrophy, unspecified: Secondary | ICD-10-CM

## 2011-12-07 MED ORDER — LEVOCETIRIZINE DIHYDROCHLORIDE 5 MG PO TABS: 5.0000 mg | ORAL_TABLET | Freq: Every evening | ORAL | Status: DC

## 2011-12-07 NOTE — Telephone Encounter (Signed)
pls fax xyzal, and f/u with pA

## 2011-12-07 NOTE — Telephone Encounter (Signed)
Faxed in and will do PA when it comes

## 2011-12-07 NOTE — Telephone Encounter (Signed)
She is needing her seating evaluated again by PT Santina Evans ) She is the one who always does this for [place] ALSO- her allergy meds are not helping and she wants to go to xyzal if possible [facility]

## 2011-12-07 NOTE — Telephone Encounter (Signed)
Called patient and left message for them to return call at the office   

## 2011-12-07 NOTE — Telephone Encounter (Signed)
pls refer to PT at [facility], specifically request Janet Acevedo per pt request, thank you

## 2011-12-08 NOTE — Telephone Encounter (Signed)
Form was faxed to [facility] hospital

## 2011-12-09 ENCOUNTER — Ambulatory Visit (INDEPENDENT_AMBULATORY_CARE_PROVIDER_SITE_OTHER): Payer: Medicare Other | Admitting: Family Medicine

## 2011-12-09 VITALS — BP 126/72 | HR 102 | Resp 18

## 2011-12-09 DIAGNOSIS — E669 Obesity, unspecified: Secondary | ICD-10-CM | POA: Diagnosis not present

## 2011-12-09 DIAGNOSIS — G128 Other spinal muscular atrophies and related syndromes: Secondary | ICD-10-CM

## 2011-12-09 DIAGNOSIS — Z13220 Encounter for screening for lipoid disorders: Secondary | ICD-10-CM | POA: Diagnosis not present

## 2011-12-09 DIAGNOSIS — R7301 Impaired fasting glucose: Secondary | ICD-10-CM | POA: Diagnosis not present

## 2011-12-09 DIAGNOSIS — J45909 Unspecified asthma, uncomplicated: Secondary | ICD-10-CM

## 2011-12-09 DIAGNOSIS — B369 Superficial mycosis, unspecified: Secondary | ICD-10-CM

## 2011-12-09 DIAGNOSIS — J309 Allergic rhinitis, unspecified: Secondary | ICD-10-CM

## 2011-12-09 DIAGNOSIS — K219 Gastro-esophageal reflux disease without esophagitis: Secondary | ICD-10-CM

## 2011-12-09 NOTE — Progress Notes (Signed)
  Subjective:    Patient ID: Janet Acevedo, female    DOB: [birth year], 21 y.o.   MRN: [id number]  HPI The PT is here for follow up and re-evaluation of chronic medical conditions, medication management and review of any available recent lab and radiology data.  Preventive health is updated, specifically  Cancer screening and Immunization.   Questions or concerns regarding consultations or procedures which the PT has had in the interim are  addressed. The PT denies any adverse reactions to current medications since the last visit.  There are no new concerns.  There are no specific complaints      Review of Systems See HPI Denies recent fever or chills. Denies sinus pressure, nasal congestion, ear pain or sore throat. Denies chest congestion, productive cough or wheezing. Denies chest pains, palpitations and leg swelling Denies abdominal pain, nausea, vomiting,diarrhea or constipation.   Denies dysuria, frequency, hesitancy or incontinence. Denies joint pain, chronic limitation  in mobility. Denies headaches, seizures, numbness, or tingling. Denies depression, anxiety or insomnia. Denies skin break down or rash.        Objective:   Physical Exam  Patient alert and oriented and in no cardiopulmonary distress.  HEENT: No facial asymmetry, EOMI, no sinus tenderness,  oropharynx pink and moist.  Neck decreased ROM, secondary to muscle weakness no adenopathy.  Chest: Clear to auscultation bilaterally.Decreased breath sounds, though adequate, no crackles  CVS: S1, S2 no murmurs, no S3.  ABD: Soft non tender. Bowel sounds normal.Obese  Ext: No edema  MS: decreased ROM spine, shoulders, hips and knees.  Skin: Intact, no ulcerations or rash noted.  Psych: Good eye contact, normal affect. Memory intact not anxious or depressed appearing.  CNS: CN 2-12 intact reduced  Power throughout, tone reduced  Throughout.Sensation normal       Assessment & Plan:

## 2011-12-09 NOTE — Patient Instructions (Addendum)
F/u in August.  Please call if you need me before.   Fasting lipid , chem 7 ,HBA1c  Please maks Make  changes in eating so you improve your health

## 2011-12-10 ENCOUNTER — Encounter: Payer: Self-pay | Admitting: Family Medicine

## 2011-12-10 NOTE — Assessment & Plan Note (Signed)
No current flare at this time reportedly

## 2011-12-10 NOTE — Assessment & Plan Note (Signed)
Controlled with current med requires xyzal

## 2011-12-10 NOTE — Assessment & Plan Note (Signed)
Deteriorated. Patient re-educated about  the importance of commitment to a  minimum of 150 minutes of exercise per week. The importance of healthy food choices with portion control discussed. Encouraged to start a food diary, count calories and to consider  joining a support group. Sample diet sheets offered. Goals set by the patient for the next several months.    

## 2011-12-10 NOTE — Assessment & Plan Note (Signed)
Unchanged , has upcoming PT eval to adjust seat on her mobility equipment

## 2011-12-10 NOTE — Assessment & Plan Note (Addendum)
Currently stable only using advair at this time, recent pulmonary eval at [facility] reportedly revealed improved lung function

## 2011-12-10 NOTE — Assessment & Plan Note (Signed)
Severe has been on daily prevacid for years, controlled on this med

## 2011-12-15 DIAGNOSIS — G129 Spinal muscular atrophy, unspecified: Secondary | ICD-10-CM | POA: Diagnosis not present

## 2011-12-15 DIAGNOSIS — IMO0001 Reserved for inherently not codable concepts without codable children: Secondary | ICD-10-CM | POA: Diagnosis not present

## 2011-12-22 ENCOUNTER — Other Ambulatory Visit: Payer: Self-pay | Admitting: Family Medicine

## 2012-01-04 ENCOUNTER — Telehealth: Payer: Self-pay | Admitting: Family Medicine

## 2012-01-04 NOTE — Telephone Encounter (Signed)
Pt has tried all alternatives listed per mother so send for appeal form pls, she is aware

## 2012-01-04 NOTE — Telephone Encounter (Signed)
We have already done the PA and it was denied. Said that alternatives were: flonase, nasonex, qnasl and triamcinolone acetonide . Patient has called about this several times. What do you want to do? I also have an appeal form if you want to appeal the decision.

## 2012-01-05 NOTE — Telephone Encounter (Signed)
Appeal form filled out for Dr to sign.

## 2012-01-07 ENCOUNTER — Other Ambulatory Visit: Payer: Self-pay | Admitting: Family Medicine

## 2012-02-04 ENCOUNTER — Other Ambulatory Visit: Payer: Self-pay | Admitting: Family Medicine

## 2012-02-10 ENCOUNTER — Telehealth: Payer: Self-pay | Admitting: Family Medicine

## 2012-02-10 NOTE — Telephone Encounter (Signed)
This is one of the least sedating, I advise use every other day and if extra drippy then take 1 sudafed, as needed, this is OTC, otherwise , she may try claritin, which also is OTC. also she needs to make sure she has good sleep habits, going to bed at a set time and getting enough rest, or she will feel tired. No new med to prescribe at this time

## 2012-02-12 ENCOUNTER — Other Ambulatory Visit: Payer: Self-pay

## 2012-02-12 ENCOUNTER — Telehealth: Payer: Self-pay | Admitting: *Deleted

## 2012-02-12 MED ORDER — FLUTICASONE-SALMETEROL 115-21 MCG/ACT IN AERO: 2.0000 | INHALATION_SPRAY | Freq: Two times a day (BID) | RESPIRATORY_TRACT | Status: DC

## 2012-02-12 NOTE — Telephone Encounter (Signed)
Pt aware of response in previous telephone message.

## 2012-02-12 NOTE — Telephone Encounter (Signed)
Pt aware.

## 2012-02-12 NOTE — Telephone Encounter (Signed)
Janet Acevedo would like for the nurse to give her a call at [phone]. She has a question about her allery medication charged, it makes her sleepy.

## 2012-02-15 ENCOUNTER — Telehealth: Payer: Self-pay | Admitting: Family Medicine

## 2012-02-16 ENCOUNTER — Other Ambulatory Visit: Payer: Self-pay | Admitting: Family Medicine

## 2012-02-17 ENCOUNTER — Other Ambulatory Visit: Payer: Self-pay | Admitting: Family Medicine

## 2012-02-17 MED ORDER — PREDNISONE (PAK) 5 MG PO TABS: ORAL_TABLET | ORAL | Status: DC

## 2012-02-17 MED ORDER — LEVALBUTEROL HCL 1.25 MG/3ML IN NEBU: 1.0000 | INHALATION_SOLUTION | Freq: Four times a day (QID) | RESPIRATORY_TRACT | Status: DC | PRN

## 2012-02-17 NOTE — Telephone Encounter (Signed)
No fever or weakness. Will try prednisone for now

## 2012-02-17 NOTE — Telephone Encounter (Signed)
States she has been sick since the weekend. A little wheezing at night but mainly just heaviness in her chest and cough with colored phlegm production. Still want to use the prednisone or send in something different?

## 2012-02-17 NOTE — Telephone Encounter (Signed)
She can start with the prednisone and needs to schedule appt to be seen with colored phlegm if she also has fever and malaise. Sen the pred entered please

## 2012-02-17 NOTE — Telephone Encounter (Signed)
pls document from patient duration of symptoms if sputum production, I will send in a pred dose pack if she is only wheezing (entered historically)

## 2012-03-02 ENCOUNTER — Other Ambulatory Visit: Payer: Self-pay | Admitting: Family Medicine

## 2012-03-03 ENCOUNTER — Telehealth: Payer: Self-pay | Admitting: Family Medicine

## 2012-03-03 NOTE — Telephone Encounter (Signed)
Pharmacy aware we have not been able to get in touch with medicaid

## 2012-03-07 ENCOUNTER — Encounter: Payer: Self-pay | Admitting: Family Medicine

## 2012-03-07 ENCOUNTER — Other Ambulatory Visit: Payer: Self-pay | Admitting: Family Medicine

## 2012-03-07 ENCOUNTER — Ambulatory Visit (INDEPENDENT_AMBULATORY_CARE_PROVIDER_SITE_OTHER): Payer: Medicare Other | Admitting: Family Medicine

## 2012-03-07 VITALS — BP 124/80 | HR 76 | Resp 16

## 2012-03-07 DIAGNOSIS — R7301 Impaired fasting glucose: Secondary | ICD-10-CM | POA: Diagnosis not present

## 2012-03-07 DIAGNOSIS — K219 Gastro-esophageal reflux disease without esophagitis: Secondary | ICD-10-CM | POA: Diagnosis not present

## 2012-03-07 DIAGNOSIS — D72829 Elevated white blood cell count, unspecified: Secondary | ICD-10-CM | POA: Diagnosis not present

## 2012-03-07 DIAGNOSIS — E669 Obesity, unspecified: Secondary | ICD-10-CM | POA: Diagnosis not present

## 2012-03-07 DIAGNOSIS — Z13220 Encounter for screening for lipoid disorders: Secondary | ICD-10-CM | POA: Diagnosis not present

## 2012-03-07 DIAGNOSIS — G128 Other spinal muscular atrophies and related syndromes: Secondary | ICD-10-CM | POA: Diagnosis not present

## 2012-03-07 DIAGNOSIS — J45909 Unspecified asthma, uncomplicated: Secondary | ICD-10-CM | POA: Diagnosis not present

## 2012-03-07 LAB — BASIC METABOLIC PANEL
BUN: 8 mg/dL (ref 6–23)
CO2: 26 mEq/L (ref 19–32)
Calcium: 9.4 mg/dL (ref 8.4–10.5)
Glucose, Bld: 72 mg/dL (ref 70–99)
Potassium: 4.1 mEq/L (ref 3.5–5.3)
Sodium: 140 mEq/L (ref 135–145)

## 2012-03-07 LAB — LIPID PANEL
Cholesterol: 192 mg/dL (ref 0–200)
Total CHOL/HDL Ratio: 6 Ratio
VLDL: 13 mg/dL (ref 0–40)

## 2012-03-07 LAB — HEMOGLOBIN A1C
Hgb A1c MFr Bld: 5.3 % (ref <5.7)
Mean Plasma Glucose: 105 mg/dL (ref <117)

## 2012-03-07 NOTE — Patient Instructions (Addendum)
F/U in month   Please call if you need me before.] We will contact you in the next 2 days regarding the xopenex    Call for /get the flu vaccine in October please.  Medications, as discussed

## 2012-03-08 LAB — CBC WITH DIFFERENTIAL/PLATELET
Eosinophils Absolute: 0.3 10*3/uL (ref 0.0–0.7)
HCT: 37.9 % (ref 36.0–46.0)
Hemoglobin: 11.8 g/dL — ABNORMAL LOW (ref 12.0–15.0)
Lymphs Abs: 3.1 10*3/uL (ref 0.7–4.0)
MCH: 26.1 pg (ref 26.0–34.0)
Monocytes Absolute: 0.6 10*3/uL (ref 0.1–1.0)
Monocytes Relative: 7 % (ref 3–12)
Neutro Abs: 5 10*3/uL (ref 1.7–7.7)
Neutrophils Relative %: 55 % (ref 43–77)
RBC: 4.52 MIL/uL (ref 3.87–5.11)

## 2012-03-11 NOTE — Progress Notes (Signed)
  Subjective:    Patient ID: Janet Acevedo, female    DOB: November 01, 1990, 21 y.o.   MRN: [id number]  HPI The PT is here for follow up and re-evaluation of chronic medical conditions, medication management and review of any available recent lab and radiology data.  Preventive health is updated, specifically  Cancer screening and Immunization.   Questions or concerns regarding consultations or procedures which the PT has had in the interim are  addressed. The PT denies any adverse reactions to current medications since the last visit.  There are no new concerns.  There are no specific complaints       Review of Systems See HPI Denies recent fever or chills. Denies sinus pressure, nasal congestion, ear pain or sore throat. Denies chest congestion, productive cough or wheezing. Denies chest pains, palpitations and leg swelling Denies abdominal pain, nausea, vomiting,diarrhea or constipation.   Denies dysuria, frequency, hesitancy or incontinence. Denies joint pain, swelling  Denies headaches, seizures, numbness, or tingling. Denies depression, anxiety or insomnia. Denies skin break down or rash.        Objective:   Physical Exam Patient alert and oriented and in no cardiopulmonary distress.whheelchair dependent  HEENT: No facial asymmetry, EOMI, no sinus tenderness,  oropharynx pink and moist.  Neck decreased ROM, no  adenopathy.  Chest: Clear to auscultation bilaterally.  CVS: S1, S2 no murmurs, no S3.  ABD: Soft non tender. Bowel sounds normal.  Ext: No edema  MS: decreased  ROM spine, shoulders, hips and knees.  Skin: Intact, no ulcerations or rash noted.  Psych: Good eye contact, normal affect. Memory intact not anxious or depressed appearing.  CNS: CN 2-12 intact, power,diminished in upper and lower extremities, sensation intact        Assessment & Plan:

## 2012-03-11 NOTE — Assessment & Plan Note (Signed)
Reports making duetary changes to facilitate weight loss

## 2012-03-11 NOTE — Assessment & Plan Note (Signed)
controlled , needs xopenex a s albuterol causes palpitations

## 2012-03-11 NOTE — Assessment & Plan Note (Signed)
Controlled, no change in medication  

## 2012-03-11 NOTE — Assessment & Plan Note (Signed)
Wheelchair dependent , non ambulatory, wih generalized muscle weakness, needs assistance with ADL's

## 2012-03-17 ENCOUNTER — Telehealth: Payer: Self-pay | Admitting: Family Medicine

## 2012-03-17 MED ORDER — PANTOPRAZOLE SODIUM 40 MG PO TBEC: 40.0000 mg | DELAYED_RELEASE_TABLET | Freq: Every day | ORAL | Status: DC

## 2012-03-17 NOTE — Telephone Encounter (Signed)
She can try Protonix 40mg  once a day, please let her know this may have to go through insurance as not on preferred drug list. Please ask her all the medications she has tried for her acid reflux

## 2012-03-17 NOTE — Telephone Encounter (Signed)
Is there something stronger that can be sent in with this patient?

## 2012-03-24 ENCOUNTER — Telehealth: Payer: Self-pay | Admitting: Family Medicine

## 2012-03-25 NOTE — Telephone Encounter (Signed)
Patient aware.

## 2012-03-25 NOTE — Telephone Encounter (Signed)
She is aware she is to try the protonix for her reflux

## 2012-03-29 ENCOUNTER — Telehealth: Payer: Self-pay | Admitting: Family Medicine

## 2012-03-29 NOTE — Telephone Encounter (Signed)
Pt has both prevACID AND PROTONIX ON MED LIST. i HAVE A NOTE FROM [facility] ALSO. sHE NEEDS ONLY one OR THE OTHER. pLEASE VERIFY WHICH OF THEM WORKS BETTER FOR HER/WHICH SHE WOULD PREFER, LET ME KNOW SO I can stop the other and also will need to notify the pharmacy when this is done

## 2012-03-30 NOTE — Telephone Encounter (Signed)
She is aware that she can only use one and she is trying the protonix now

## 2012-03-30 NOTE — Telephone Encounter (Signed)
Please specifically ask that she call back next week to let me know which she prefers, I need to resolve this asap

## 2012-04-04 ENCOUNTER — Telehealth: Payer: Self-pay | Admitting: Family Medicine

## 2012-04-04 NOTE — Telephone Encounter (Signed)
Was just needing to know which acid reducer worked best because one has to be d/c'd

## 2012-04-06 NOTE — Telephone Encounter (Signed)
Needs to schedule flu shot. Luann to schedule

## 2012-04-07 ENCOUNTER — Ambulatory Visit (INDEPENDENT_AMBULATORY_CARE_PROVIDER_SITE_OTHER): Payer: Medicare Other

## 2012-04-07 DIAGNOSIS — Z23 Encounter for immunization: Secondary | ICD-10-CM

## 2012-04-07 MED ORDER — ALBUTEROL SULFATE HFA 108 (90 BASE) MCG/ACT IN AERS: 2.0000 | INHALATION_SPRAY | RESPIRATORY_TRACT | Status: DC | PRN

## 2012-04-08 ENCOUNTER — Other Ambulatory Visit: Payer: Self-pay | Admitting: Family Medicine

## 2012-04-08 NOTE — Telephone Encounter (Signed)
pls send stamped discontinue order for prevacid to the pharmacy for this patient

## 2012-04-08 NOTE — Telephone Encounter (Signed)
States she is going to stay on the protonix

## 2012-04-08 NOTE — Telephone Encounter (Signed)
D/c order sent  

## 2012-04-20 ENCOUNTER — Other Ambulatory Visit: Payer: Self-pay | Admitting: Family Medicine

## 2012-04-27 ENCOUNTER — Other Ambulatory Visit: Payer: Self-pay | Admitting: Family Medicine

## 2012-04-28 ENCOUNTER — Other Ambulatory Visit: Payer: Self-pay

## 2012-05-10 DIAGNOSIS — N926 Irregular menstruation, unspecified: Secondary | ICD-10-CM | POA: Diagnosis not present

## 2012-05-18 ENCOUNTER — Telehealth: Payer: Self-pay | Admitting: Family Medicine

## 2012-05-18 DIAGNOSIS — J45909 Unspecified asthma, uncomplicated: Secondary | ICD-10-CM

## 2012-05-18 DIAGNOSIS — R05 Cough: Secondary | ICD-10-CM

## 2012-05-18 DIAGNOSIS — R059 Cough, unspecified: Secondary | ICD-10-CM

## 2012-05-18 NOTE — Telephone Encounter (Signed)
Feeling some pressure on her chest for the past 4 days. Not sick, no drainage or anything, feels fine but feels like something is sitting on her chest on left side. Describes it as a tight feeling. Mother wants to know if you could order a chest xray or if she would have to come in the office

## 2012-05-19 NOTE — Telephone Encounter (Signed)
Patient aware.

## 2012-05-19 NOTE — Telephone Encounter (Signed)
Chest xray  Ordered. We will call with results before sending antibiotics

## 2012-05-20 ENCOUNTER — Ambulatory Visit (HOSPITAL_COMMUNITY)
Admission: RE | Admit: 2012-05-20 | Discharge: 2012-05-20 | Disposition: A | Discharge status: Home / Self Care | Payer: Medicare Other | Source: Ambulatory Visit | Attending: Family Medicine | Admitting: Family Medicine

## 2012-05-20 DIAGNOSIS — J45909 Unspecified asthma, uncomplicated: Secondary | ICD-10-CM | POA: Diagnosis not present

## 2012-05-20 DIAGNOSIS — G7109 Other specified muscular dystrophies: Secondary | ICD-10-CM | POA: Diagnosis not present

## 2012-05-20 DIAGNOSIS — R05 Cough: Secondary | ICD-10-CM | POA: Diagnosis not present

## 2012-05-20 DIAGNOSIS — R059 Cough, unspecified: Secondary | ICD-10-CM | POA: Insufficient documentation

## 2012-05-20 DIAGNOSIS — R0602 Shortness of breath: Secondary | ICD-10-CM | POA: Diagnosis not present

## 2012-05-27 ENCOUNTER — Other Ambulatory Visit: Payer: Self-pay

## 2012-05-27 ENCOUNTER — Telehealth: Payer: Self-pay | Admitting: Family Medicine

## 2012-05-27 ENCOUNTER — Other Ambulatory Visit: Payer: Self-pay | Admitting: Family Medicine

## 2012-05-27 MED ORDER — CROMOLYN SODIUM 4 % OP SOLN: 1.0000 [drp] | Freq: Four times a day (QID) | OPHTHALMIC | Status: DC

## 2012-05-27 NOTE — Telephone Encounter (Signed)
Sent in

## 2012-05-27 NOTE — Telephone Encounter (Signed)
The cromolyn needs to be eye drops. It was nasal spray that was sent. They have opthalmic and nasal

## 2012-05-27 NOTE — Telephone Encounter (Signed)
Eye drops entered historically pls send

## 2012-05-27 NOTE — Telephone Encounter (Signed)
Please let Janet Acevedo know that there has to BE rECENT , in the past 6 month of 2 failed drugs and all the bad side effects documented in her clinuical record before I can request thedrug she wants. I am sending in cromolyn, she needs to fill this and use this, let me know results before I can send for the bepreve

## 2012-05-28 ENCOUNTER — Other Ambulatory Visit: Payer: Self-pay | Admitting: Family Medicine

## 2012-06-01 DIAGNOSIS — J45909 Unspecified asthma, uncomplicated: Secondary | ICD-10-CM | POA: Diagnosis not present

## 2012-06-01 DIAGNOSIS — R030 Elevated blood-pressure reading, without diagnosis of hypertension: Secondary | ICD-10-CM | POA: Diagnosis not present

## 2012-06-01 DIAGNOSIS — J984 Other disorders of lung: Secondary | ICD-10-CM | POA: Diagnosis not present

## 2012-06-01 DIAGNOSIS — G121 Other inherited spinal muscular atrophy: Secondary | ICD-10-CM | POA: Diagnosis not present

## 2012-06-07 ENCOUNTER — Other Ambulatory Visit: Payer: Self-pay | Admitting: Family Medicine

## 2012-06-09 ENCOUNTER — Other Ambulatory Visit: Payer: Self-pay | Admitting: Family Medicine

## 2012-06-13 ENCOUNTER — Other Ambulatory Visit: Payer: Self-pay | Admitting: Family Medicine

## 2012-06-21 ENCOUNTER — Telehealth: Payer: Self-pay

## 2012-06-21 ENCOUNTER — Other Ambulatory Visit: Payer: Self-pay | Admitting: Family Medicine

## 2012-06-21 MED ORDER — AZITHROMYCIN 250 MG PO TABS: ORAL_TABLET | ORAL | Status: AC

## 2012-06-21 MED ORDER — PREDNISONE 10 MG PO TABS: ORAL_TABLET | ORAL | Status: DC

## 2012-06-21 NOTE — Telephone Encounter (Signed)
Patient aware.

## 2012-06-21 NOTE — Telephone Encounter (Signed)
Called and left message for patient or mother to return call.

## 2012-06-21 NOTE — Telephone Encounter (Signed)
Zpak, prednisone 20mg  daily x 5 days

## 2012-06-27 ENCOUNTER — Telehealth: Payer: Self-pay

## 2012-06-27 ENCOUNTER — Other Ambulatory Visit: Payer: Self-pay

## 2012-06-27 MED ORDER — PREDNISONE (PAK) 5 MG PO TABS: ORAL_TABLET | ORAL | Status: DC

## 2012-06-27 NOTE — Telephone Encounter (Signed)
Sent in and patient aware 

## 2012-06-27 NOTE — Telephone Encounter (Signed)
Please send in prednisone 5mg  dose pack for 6 days and let her know

## 2012-07-05 ENCOUNTER — Other Ambulatory Visit: Payer: Self-pay | Admitting: Family Medicine

## 2012-07-11 ENCOUNTER — Other Ambulatory Visit: Payer: Self-pay

## 2012-07-11 ENCOUNTER — Ambulatory Visit (INDEPENDENT_AMBULATORY_CARE_PROVIDER_SITE_OTHER): Payer: Medicare Other | Admitting: Family Medicine

## 2012-07-11 ENCOUNTER — Encounter: Payer: Self-pay | Admitting: Family Medicine

## 2012-07-11 VITALS — BP 120/78 | HR 85 | Resp 16

## 2012-07-11 DIAGNOSIS — J45909 Unspecified asthma, uncomplicated: Secondary | ICD-10-CM

## 2012-07-11 DIAGNOSIS — K219 Gastro-esophageal reflux disease without esophagitis: Secondary | ICD-10-CM

## 2012-07-11 DIAGNOSIS — J302 Other seasonal allergic rhinitis: Secondary | ICD-10-CM | POA: Insufficient documentation

## 2012-07-11 DIAGNOSIS — R7301 Impaired fasting glucose: Secondary | ICD-10-CM | POA: Diagnosis not present

## 2012-07-11 DIAGNOSIS — D649 Anemia, unspecified: Secondary | ICD-10-CM

## 2012-07-11 DIAGNOSIS — R5381 Other malaise: Secondary | ICD-10-CM

## 2012-07-11 DIAGNOSIS — E669 Obesity, unspecified: Secondary | ICD-10-CM

## 2012-07-11 DIAGNOSIS — G128 Other spinal muscular atrophies and related syndromes: Secondary | ICD-10-CM

## 2012-07-11 DIAGNOSIS — J309 Allergic rhinitis, unspecified: Secondary | ICD-10-CM

## 2012-07-11 DIAGNOSIS — R5383 Other fatigue: Secondary | ICD-10-CM

## 2012-07-11 MED ORDER — OLOPATADINE HCL 0.1 % OP SOLN: 1.0000 [drp] | Freq: Two times a day (BID) | OPHTHALMIC | Status: DC

## 2012-07-11 MED ORDER — LEVALBUTEROL HCL 1.25 MG/3ML IN NEBU: INHALATION_SOLUTION | RESPIRATORY_TRACT | Status: DC

## 2012-07-11 NOTE — Progress Notes (Signed)
  Subjective:    Patient ID: Janet Acevedo, female    DOB: Jul 13, 1991, 21 y.o.   MRN: [id number]  HPI The PT is here for follow up and re-evaluation of chronic medical conditions, medication management and review of any available recent lab and radiology data.  Preventive health is updated, specifically  Cancer screening and Immunization.   Questions or concerns regarding consultations or procedures which the PT has had in the interim are  addressed. The PT denies any adverse reactions to current medications since the last visit.  There are no new concerns.  There are no specific complaints       Review of Systems See HPI Denies recent fever or chills. Denies sinus pressure, nasal congestion, ear pain or sore throat. Denies chest congestion, productive cough or wheezing. Denies chest pains, palpitations and leg swelling Denies abdominal pain, nausea, vomiting,diarrhea or constipation.   Denies dysuria, frequency, hesitancy or incontinence. Quadriplegic, relies on motorized wheelchair Denies headaches, seizures, numbness, or tingling. Denies depression, anxiety or insomnia. Denies skin break down or rash.        Objective:   Physical Exam  Patient alert and oriented and in no cardiopulmonary distress.  HEENT: No facial asymmetry, EOMI, no sinus tenderness,  .  Neck reduced   ROM no adenopathy.  Chest: Clear to auscultation bilaterally.Decreased air entry throughout, no crackles or wheezes  CVS: S1, S2 no murmurs, no S3.  ABD: Soft non tender. Bowel sounds normal.  Ext: No edema  MS: decreased  ROM spine, shoulders, hips and knees.  Skin: Intact, no ulcerations or rash noted.  Psych: Good eye contact, normal affect. Memory intact not anxious or depressed appearing.  CNS: CN 2-12 intact, power and  tone decreased throughout.Sensation normal       Assessment & Plan:

## 2012-07-11 NOTE — Patient Instructions (Addendum)
Annual wellness in 4 month  Fasting lipid, chem 7, HBA1C, cbc, iron, tSH in 4 month   Please continue to work on changing your eating so your health improves  You have a little bit of hard skin ion the l;ittle toe where you hurt

## 2012-07-19 ENCOUNTER — Other Ambulatory Visit: Payer: Self-pay | Admitting: Family Medicine

## 2012-07-19 ENCOUNTER — Telehealth: Payer: Self-pay

## 2012-07-19 MED ORDER — OSELTAMIVIR PHOSPHATE 75 MG PO CAPS: 75.0000 mg | ORAL_CAPSULE | Freq: Two times a day (BID) | ORAL | Status: DC

## 2012-07-19 NOTE — Telephone Encounter (Signed)
mother aware med sent in

## 2012-07-19 NOTE — Telephone Encounter (Signed)
States Bernie started aching during the night and when she woke up she had a fever of 102. Chills and aching all over. No cough, congestion or sore throat. DID receive her flu vaccine. Please advise

## 2012-07-19 NOTE — Telephone Encounter (Signed)
Noted  

## 2012-07-19 NOTE — Telephone Encounter (Signed)
Advise I am sending in tamiflu, and recommend tylenol, fluids and rest please

## 2012-07-25 ENCOUNTER — Other Ambulatory Visit: Payer: Self-pay | Admitting: Family Medicine

## 2012-08-01 ENCOUNTER — Other Ambulatory Visit: Payer: Self-pay | Admitting: Family Medicine

## 2012-08-02 ENCOUNTER — Other Ambulatory Visit: Payer: Self-pay | Admitting: Family Medicine

## 2012-08-02 ENCOUNTER — Telehealth: Payer: Self-pay

## 2012-08-02 MED ORDER — PENICILLIN V POTASSIUM 500 MG PO TABS: 500.0000 mg | ORAL_TABLET | Freq: Three times a day (TID) | ORAL | Status: DC

## 2012-08-02 MED ORDER — BENZONATATE 100 MG PO CAPS: 100.0000 mg | ORAL_CAPSULE | Freq: Four times a day (QID) | ORAL | Status: DC | PRN

## 2012-08-02 NOTE — Telephone Encounter (Signed)
pls see note 

## 2012-08-02 NOTE — Telephone Encounter (Signed)
UPDATE--- Mother states she DOES have a fever now of 100.6 oral

## 2012-08-02 NOTE — Telephone Encounter (Signed)
Check with [facility] and let them know what you are being told pleaase and see if they will fill the xopenex. Also I am sending in 1 week course of antibiotic, let [name] know and if she is not improving she needs to bring her in , in the next 2 days

## 2012-08-02 NOTE — Telephone Encounter (Signed)
Patient aware and also aware that I called medicaid and got the PA needed for the xopenex so it should be able to be filled Approved for 1 year Approval # [id number]

## 2012-08-02 NOTE — Telephone Encounter (Signed)
Mother states Janet Acevedo is coughing up greenish phlegm, some wheezing at night and early am. NO fever chills or bodyaches, just congestion. Still unable to get the xopenex refilled. [facility] is stating insurance is rejecting it but when I called to do PA they said it was covered and no PA was needed

## 2012-08-06 NOTE — Assessment & Plan Note (Signed)
Quadriplegic, however exceeds in classes at [employer], no impairment in mental capacity and doing well. No skin breakdown, no urinary infections. On depo for control of bleeding

## 2012-08-06 NOTE — Assessment & Plan Note (Signed)
Ongoing problem, unable to assess as cannot be weighed in this office. Encouraged pt to focus on healthy food choices and portion control

## 2012-08-06 NOTE — Assessment & Plan Note (Signed)
Controlled, no change in medication  

## 2012-08-11 ENCOUNTER — Other Ambulatory Visit: Payer: Self-pay | Admitting: Family Medicine

## 2012-08-17 ENCOUNTER — Other Ambulatory Visit: Payer: Self-pay | Admitting: Family Medicine

## 2012-09-06 ENCOUNTER — Other Ambulatory Visit: Payer: Self-pay | Admitting: Family Medicine

## 2012-09-19 ENCOUNTER — Other Ambulatory Visit: Payer: Self-pay | Admitting: Family Medicine

## 2012-10-01 ENCOUNTER — Other Ambulatory Visit: Payer: Self-pay | Admitting: Family Medicine

## 2012-10-03 ENCOUNTER — Other Ambulatory Visit: Payer: Self-pay | Admitting: Family Medicine

## 2012-10-14 ENCOUNTER — Other Ambulatory Visit: Payer: Self-pay | Admitting: Family Medicine

## 2012-10-21 ENCOUNTER — Other Ambulatory Visit: Payer: Self-pay | Admitting: Family Medicine

## 2012-10-28 ENCOUNTER — Telehealth: Payer: Self-pay

## 2012-10-28 ENCOUNTER — Other Ambulatory Visit: Payer: Self-pay | Admitting: Family Medicine

## 2012-10-28 MED ORDER — DOXYCYCLINE HYCLATE 100 MG PO CAPS: 100.0000 mg | ORAL_CAPSULE | Freq: Two times a day (BID) | ORAL | Status: AC

## 2012-10-28 MED ORDER — PREDNISONE (PAK) 5 MG PO TABS: 5.0000 mg | ORAL_TABLET | ORAL | Status: DC

## 2012-10-28 NOTE — Telephone Encounter (Signed)
Patient aware.

## 2012-10-28 NOTE — Telephone Encounter (Signed)
pls advise prednisone and doxycycline sent in she needs to be seen in office next week, transfer for appt. Had appt aftyernoon of the 17th, not an option

## 2012-10-28 NOTE — Telephone Encounter (Signed)
pls sched appt nwext week for her, she is calling in for medication which I will strt her on but she needs visit next week, cancel 4/17

## 2012-11-01 NOTE — Telephone Encounter (Signed)
Patient has appointment with Dr. 4.9.2014

## 2012-11-02 ENCOUNTER — Encounter: Payer: Self-pay | Admitting: Family Medicine

## 2012-11-02 ENCOUNTER — Ambulatory Visit (INDEPENDENT_AMBULATORY_CARE_PROVIDER_SITE_OTHER): Payer: Medicare Other | Admitting: Family Medicine

## 2012-11-02 VITALS — BP 118/82 | HR 80 | Resp 16

## 2012-11-02 DIAGNOSIS — E669 Obesity, unspecified: Secondary | ICD-10-CM

## 2012-11-02 DIAGNOSIS — J45909 Unspecified asthma, uncomplicated: Secondary | ICD-10-CM

## 2012-11-02 DIAGNOSIS — K219 Gastro-esophageal reflux disease without esophagitis: Secondary | ICD-10-CM

## 2012-11-02 DIAGNOSIS — J309 Allergic rhinitis, unspecified: Secondary | ICD-10-CM | POA: Diagnosis not present

## 2012-11-02 DIAGNOSIS — J209 Acute bronchitis, unspecified: Secondary | ICD-10-CM | POA: Diagnosis not present

## 2012-11-02 DIAGNOSIS — G128 Other spinal muscular atrophies and related syndromes: Secondary | ICD-10-CM

## 2012-11-02 NOTE — Progress Notes (Signed)
  Subjective:    Patient ID: Janet Acevedo, female    DOB: 11-08-1990, 22 y.o.   MRN: [id number]  HPI Pt in for follow up of 1 week h/o increased chest congestion  And sputum, she was started on medication 6 days ago, and reports marked improvement in symptoms. Denies any fever or chills currently She uses a suction tube on avg 3 times per day, sputum has gone from yellow to clear, she will finish all medication prescribed. Otherwise doing well with no other complaints   Review of Systems See HPI Denies recent fever or chills. Denies sinus pressure, nasal congestion, ear pain or sore throat. Denies chest pains, palpitations and leg swelling Denies abdominal pain, nausea, vomiting,diarrhea or constipation.   Denies dysuria, frequency, hesitancy or incontinence. Chronic  limitation in mobility. Denies headaches, seizures, numbness, or tingling. Denies depression, anxiety or insomnia. Denies skin break down or rash.        Objective:   Physical Exam Patient alert and oriented and in no cardiopulmonary distress.Pleasant   HEENT: No facial asymmetry, EOMI, no sinus tenderness,  oropharynx pink and moist.  Neck supple no adenopathy.  Chest: Diminished air entry throughout no whezes, few bilateral basilar crackles  CVS: S1, S2 no murmurs, no S3.  ABD: Soft non tender. Bowel sounds normal.  Ext: No edema  MS: Decreased  ROM spine, shoulders, hips and knees.  Skin: Intact, no ulcerations or rash noted.  Psych: Good eye contact, normal affect. Memory intact not anxious or depressed appearing.  CNS: CN 2-12 intact, power, diminished in upper and lower extremities        Assessment & Plan:

## 2012-11-02 NOTE — Patient Instructions (Addendum)
F/u 3rd or 4th week in May, call if you need me before  I a happy you are improved, please take all medication as prescribed  Good luck with your exams, be prepared1

## 2012-11-05 DIAGNOSIS — J209 Acute bronchitis, unspecified: Secondary | ICD-10-CM | POA: Insufficient documentation

## 2012-11-05 NOTE — Assessment & Plan Note (Signed)
permanently wheelchair dependent with upper ext weakness also, has adapted well and is a full time college student who does well Followed at a tertiary center also

## 2012-11-05 NOTE — Assessment & Plan Note (Signed)
Controlled, no change in medication  

## 2012-11-05 NOTE — Assessment & Plan Note (Signed)
Unchangd, inconsistent effort on dietary change

## 2012-11-05 NOTE — Assessment & Plan Note (Signed)
Improved since stating treatment , pt to complete course

## 2012-11-10 ENCOUNTER — Encounter: Payer: Medicare Other | Admitting: Family Medicine

## 2012-11-30 ENCOUNTER — Encounter: Payer: Self-pay | Admitting: Family Medicine

## 2012-11-30 ENCOUNTER — Encounter: Payer: Medicare Other | Admitting: Family Medicine

## 2012-11-30 ENCOUNTER — Ambulatory Visit (INDEPENDENT_AMBULATORY_CARE_PROVIDER_SITE_OTHER): Payer: Medicare Other | Admitting: Family Medicine

## 2012-11-30 VITALS — BP 130/84 | HR 107 | Resp 16

## 2012-11-30 DIAGNOSIS — Z0000 Encounter for general adult medical examination without abnormal findings: Secondary | ICD-10-CM | POA: Diagnosis not present

## 2012-11-30 NOTE — Patient Instructions (Addendum)
F/U in early October   Call if you need me before    Please change your eating habits slowly but surely, suction every day as recommended, and do some chair dancing every day  No med change

## 2012-11-30 NOTE — Progress Notes (Signed)
Subjective:    Patient ID: Janet Acevedo, female    DOB: 1991-02-13, 22 y.o.   MRN: [id number]  HPI  Preventive Screening-Counseling & Management   Patient present here today for a Medicare annual wellness visit.   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 No nicotine , alcohol, or street drug use   Risk Factors  Current exercise habits:  None , needs to start upper body exercise as able  Dietary issues discussed:increased fruit and vegetables and more water   Cardiac risk factors:   Depression Screen  (Note: if answer to either of the following is "Yes", a more complete depression screening is indicated)   Over the past two weeks, have you felt down, depressed or hopeless? No  Over the past two weeks, have you felt little interest or pleasure in doing things? No  Have you lost interest or pleasure in daily life? No  Do you often feel hopeless? No  Do you cry easily over simple problems? No   Activities of Daily Living  In your present state of health, do you have any difficulty performing the following activities?  Driving?: unable Managing money?: No Feeding yourself?:No Getting from bed to chair?:yes needs assistance with all transfers Climbing a flight of stairs?:Not able Preparing food and eating?:needs someone to prepare food Bathing or showering?:yes Getting dressed?:yes, needs  Getting to the toilet?:yes Using the toilet?:yes Moving around from place to place?: yes  Fall Risk Assessment In the past year have you fallen or had a near fall?:No Are you currently taking any medications that make you dizzy?:No   Hearing Difficulties: No Do you often ask people to speak up or repeat themselves?:No Do you experience ringing or noises in your ears?:No Do  you have difficulty understanding soft or whispered voices?:No  Cognitive Testing  Alert? Yes Normal Appearance?Yes  Oriented to person? Yes Place? Yes  Time? Yes  Displays appropriate judgment?Yes  Can read the correct time from a watch face? yes Are you having problems remembering things?No  Advanced Directives have been discussed with the patient?Yes , full code, indefinite support at this time   List the Names of Other Physician/Practitioners you currently use: see list   Indicate any recent Medical Services you may have received from other than [facility] providers in the past year (date may be approximate).   Assessment:    Annual Wellness Exam   Plan:    During the course of the visit the patient was educated and counseled about appropriate screening and preventive services including:  A healthy diet is rich in fruit, vegetables and whole grains. Poultry fish, nuts and beans are a healthy choice for protein rather then red meat. A low sodium diet and drinking 64 ounces of water daily is generally recommended. Oils and sweet should be limited. Carbohydrates especially for those who are diabetic or overweight, should be limited to 30-45 gram per meal. It is important to eat on a regular schedule, at least 3 times daily. Snacks should be primarily fruits, vegetables or nuts. It is important that you exercise regularly at least 30 minutes 5 times a week. If you develop chest pain, have severe difficulty breathing, or feel very tired, stop exercising immediately and seek medical attention  Immunization reviewed and updated. Cancer screening reviewed and updated  Patient Instructions (the written plan) was given to the patient.  Medicare Attestation  I have personally reviewed:  The patient's medical and social history  Their use of alcohol, tobacco or illicit drugs  Their current medications and supplements  The patient's functional ability including ADLs,fall risks, home safety  risks, cognitive, and hearing and visual impairment  Diet and physical activities  Evidence for depression or mood disorders  The patient's weight, height, BMI, and visual acuity have been recorded in the chart. I have made referrals, counseling, and provided education to the patient based on review of the above and I have provided the patient with a written personalized care plan for preventive services.    Review of Systems     Objective:   Physical Exam        Assessment & Plan:

## 2012-12-14 ENCOUNTER — Other Ambulatory Visit: Payer: Self-pay

## 2012-12-14 MED ORDER — ALBUTEROL SULFATE HFA 108 (90 BASE) MCG/ACT IN AERS: 2.0000 | INHALATION_SPRAY | RESPIRATORY_TRACT | Status: DC | PRN

## 2012-12-19 DIAGNOSIS — Z0000 Encounter for general adult medical examination without abnormal findings: Secondary | ICD-10-CM | POA: Insufficient documentation

## 2012-12-19 NOTE — Assessment & Plan Note (Signed)
Annual wellness as documented. Mother present during visit per routine. Pt to work on eating ha bits and commitment to physical activity as tolerated to promote weight loss and improved health

## 2012-12-20 ENCOUNTER — Encounter: Payer: Medicare Other | Admitting: Family Medicine

## 2012-12-25 ENCOUNTER — Other Ambulatory Visit: Payer: Self-pay | Admitting: Family Medicine

## 2013-01-05 DIAGNOSIS — S99919A Unspecified injury of unspecified ankle, initial encounter: Secondary | ICD-10-CM | POA: Diagnosis not present

## 2013-01-05 DIAGNOSIS — S93409A Sprain of unspecified ligament of unspecified ankle, initial encounter: Secondary | ICD-10-CM | POA: Diagnosis not present

## 2013-01-05 DIAGNOSIS — S99929A Unspecified injury of unspecified foot, initial encounter: Secondary | ICD-10-CM | POA: Diagnosis not present

## 2013-01-10 ENCOUNTER — Other Ambulatory Visit: Payer: Self-pay

## 2013-01-10 MED ORDER — PROAIR HFA 108 (90 BASE) MCG/ACT IN AERS: 2.0000 | INHALATION_SPRAY | RESPIRATORY_TRACT | Status: DC | PRN

## 2013-01-11 DIAGNOSIS — G121 Other inherited spinal muscular atrophy: Secondary | ICD-10-CM | POA: Diagnosis not present

## 2013-01-11 DIAGNOSIS — Z8709 Personal history of other diseases of the respiratory system: Secondary | ICD-10-CM | POA: Diagnosis not present

## 2013-01-11 DIAGNOSIS — J984 Other disorders of lung: Secondary | ICD-10-CM | POA: Diagnosis not present

## 2013-01-11 DIAGNOSIS — J45909 Unspecified asthma, uncomplicated: Secondary | ICD-10-CM | POA: Diagnosis not present

## 2013-01-11 DIAGNOSIS — Z9109 Other allergy status, other than to drugs and biological substances: Secondary | ICD-10-CM | POA: Diagnosis not present

## 2013-02-06 ENCOUNTER — Other Ambulatory Visit: Payer: Self-pay

## 2013-02-06 MED ORDER — LEVALBUTEROL HCL 1.25 MG/3ML IN NEBU: INHALATION_SOLUTION | RESPIRATORY_TRACT | Status: DC

## 2013-03-08 ENCOUNTER — Other Ambulatory Visit: Payer: Self-pay | Admitting: Family Medicine

## 2013-03-10 NOTE — Telephone Encounter (Signed)
Noted  

## 2013-03-10 NOTE — Telephone Encounter (Signed)
Is it ok to refill? 

## 2013-03-20 ENCOUNTER — Other Ambulatory Visit: Payer: Self-pay | Admitting: Family Medicine

## 2013-03-22 ENCOUNTER — Other Ambulatory Visit: Payer: Self-pay | Admitting: Family Medicine

## 2013-03-24 ENCOUNTER — Other Ambulatory Visit: Payer: Self-pay

## 2013-03-24 MED ORDER — CICLESONIDE 50 MCG/ACT NA SUSP: NASAL | Status: DC

## 2013-03-24 MED ORDER — LEVOCETIRIZINE DIHYDROCHLORIDE 5 MG PO TABS: ORAL_TABLET | ORAL | Status: DC

## 2013-03-24 MED ORDER — PANTOPRAZOLE SODIUM 40 MG PO TBEC: DELAYED_RELEASE_TABLET | ORAL | Status: DC

## 2013-03-24 MED ORDER — CROMOLYN SODIUM 4 % OP SOLN: 1.0000 [drp] | Freq: Four times a day (QID) | OPHTHALMIC | Status: DC

## 2013-03-24 MED ORDER — MONTELUKAST SODIUM 10 MG PO TABS: ORAL_TABLET | ORAL | Status: DC

## 2013-05-09 ENCOUNTER — Telehealth: Payer: Self-pay | Admitting: Family Medicine

## 2013-05-09 DIAGNOSIS — N390 Urinary tract infection, site not specified: Secondary | ICD-10-CM

## 2013-05-09 NOTE — Telephone Encounter (Signed)
Started Sunday night, Janet Acevedo has been having abdominal pain and her urine is dark. No other symptoms as of right now. [name] already has a urine cup and wants to know if she can have an order to take it to the lab. Please advise

## 2013-05-10 ENCOUNTER — Ambulatory Visit (INDEPENDENT_AMBULATORY_CARE_PROVIDER_SITE_OTHER): Payer: Medicare Other

## 2013-05-10 DIAGNOSIS — Z23 Encounter for immunization: Secondary | ICD-10-CM | POA: Diagnosis not present

## 2013-05-10 DIAGNOSIS — N390 Urinary tract infection, site not specified: Secondary | ICD-10-CM | POA: Diagnosis not present

## 2013-05-10 NOTE — Telephone Encounter (Signed)
Order CCUA anmd c/s let her take to lab pls

## 2013-05-10 NOTE — Telephone Encounter (Signed)
Order sent and mother aware

## 2013-05-10 NOTE — Addendum Note (Signed)
Addended by: Abner Greenspan on: 05/10/2013 08:09 AM   Modules accepted: Orders

## 2013-05-11 LAB — URINALYSIS
Glucose, UA: NEGATIVE mg/dL
Hgb urine dipstick: NEGATIVE
Protein, ur: NEGATIVE mg/dL

## 2013-05-12 LAB — URINE CULTURE: Colony Count: 45000

## 2013-05-17 ENCOUNTER — Telehealth: Payer: Self-pay | Admitting: Family Medicine

## 2013-05-17 NOTE — Telephone Encounter (Signed)
Scripts written pls fax and let her know

## 2013-05-18 ENCOUNTER — Ambulatory Visit: Payer: Medicare Other | Admitting: Family Medicine

## 2013-05-18 NOTE — Telephone Encounter (Signed)
Noted.  Mother would like to collect when she comes in for appointment.

## 2013-05-24 ENCOUNTER — Other Ambulatory Visit: Payer: Self-pay | Admitting: Family Medicine

## 2013-06-08 ENCOUNTER — Telehealth: Payer: Self-pay

## 2013-06-08 NOTE — Telephone Encounter (Signed)
Based on paperwork received, she actually does need an OV specifically for that reason, pls schedule

## 2013-06-08 NOTE — Telephone Encounter (Signed)
Mother, Elita Quick called in and are trying to get a new chair for Shayda and states her paperwork said she needed a mobility evaluation by her PCP. She doesn't want to schedule a wasted OV and wants to know if this is needed and if she should schedule

## 2013-06-09 NOTE — Telephone Encounter (Signed)
Left message that we had Janet Acevedo scheduled for Nov 25 at 1:15 and if that didn't work to call back and reschedule

## 2013-06-14 ENCOUNTER — Emergency Department (HOSPITAL_COMMUNITY): Payer: Medicare Other

## 2013-06-14 ENCOUNTER — Emergency Department (HOSPITAL_COMMUNITY)
Admission: EM | Admit: 2013-06-14 | Discharge: 2013-06-14 | Disposition: A | Discharge status: Home / Self Care | Payer: Medicare Other | Attending: Emergency Medicine | Admitting: Emergency Medicine

## 2013-06-14 ENCOUNTER — Encounter (HOSPITAL_COMMUNITY): Payer: Self-pay | Admitting: Emergency Medicine

## 2013-06-14 DIAGNOSIS — IMO0002 Reserved for concepts with insufficient information to code with codable children: Secondary | ICD-10-CM | POA: Insufficient documentation

## 2013-06-14 DIAGNOSIS — Y92410 Unspecified street and highway as the place of occurrence of the external cause: Secondary | ICD-10-CM | POA: Insufficient documentation

## 2013-06-14 DIAGNOSIS — S139XXA Sprain of joints and ligaments of unspecified parts of neck, initial encounter: Secondary | ICD-10-CM | POA: Insufficient documentation

## 2013-06-14 DIAGNOSIS — M542 Cervicalgia: Secondary | ICD-10-CM | POA: Diagnosis not present

## 2013-06-14 DIAGNOSIS — Y9389 Activity, other specified: Secondary | ICD-10-CM | POA: Insufficient documentation

## 2013-06-14 DIAGNOSIS — Z8739 Personal history of other diseases of the musculoskeletal system and connective tissue: Secondary | ICD-10-CM | POA: Insufficient documentation

## 2013-06-14 DIAGNOSIS — J45909 Unspecified asthma, uncomplicated: Secondary | ICD-10-CM | POA: Diagnosis not present

## 2013-06-14 DIAGNOSIS — Z79899 Other long term (current) drug therapy: Secondary | ICD-10-CM | POA: Insufficient documentation

## 2013-06-14 DIAGNOSIS — S0993XA Unspecified injury of face, initial encounter: Secondary | ICD-10-CM | POA: Diagnosis not present

## 2013-06-14 DIAGNOSIS — S161XXA Strain of muscle, fascia and tendon at neck level, initial encounter: Secondary | ICD-10-CM
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
## 2013-06-20 ENCOUNTER — Encounter: Payer: Self-pay | Admitting: Family Medicine

## 2013-06-20 ENCOUNTER — Other Ambulatory Visit: Payer: Self-pay | Admitting: Family Medicine

## 2013-06-20 ENCOUNTER — Ambulatory Visit (INDEPENDENT_AMBULATORY_CARE_PROVIDER_SITE_OTHER): Payer: Medicare Other | Admitting: Family Medicine

## 2013-06-20 VITALS — BP 118/82 | HR 100 | Resp 16

## 2013-06-20 DIAGNOSIS — D649 Anemia, unspecified: Secondary | ICD-10-CM | POA: Diagnosis not present

## 2013-06-20 DIAGNOSIS — R5381 Other malaise: Secondary | ICD-10-CM | POA: Diagnosis not present

## 2013-06-20 DIAGNOSIS — R7301 Impaired fasting glucose: Secondary | ICD-10-CM | POA: Diagnosis not present

## 2013-06-20 DIAGNOSIS — E669 Obesity, unspecified: Secondary | ICD-10-CM | POA: Diagnosis not present

## 2013-06-20 DIAGNOSIS — G128 Other spinal muscular atrophies and related syndromes: Secondary | ICD-10-CM | POA: Diagnosis not present

## 2013-06-20 LAB — CBC WITH DIFFERENTIAL/PLATELET
Basophils Absolute: 0 10*3/uL (ref 0.0–0.1)
Basophils Relative: 0 % (ref 0–1)
Eosinophils Absolute: 0.3 10*3/uL (ref 0.0–0.7)
Eosinophils Relative: 3 % (ref 0–5)
Lymphocytes Relative: 36 % (ref 12–46)
Lymphs Abs: 3.3 10*3/uL (ref 0.7–4.0)
MCHC: 33.8 g/dL (ref 30.0–36.0)
MCV: 85 fL (ref 78.0–100.0)
Neutrophils Relative %: 55 % (ref 43–77)
Platelets: 250 10*3/uL (ref 150–400)
RBC: 4.67 MIL/uL (ref 3.87–5.11)
RDW: 14.4 % (ref 11.5–15.5)
WBC: 9.1 10*3/uL (ref 4.0–10.5)

## 2013-06-20 NOTE — Progress Notes (Signed)
  Subjective:    Patient ID: Janet Acevedo, female    DOB: [birth year], 22 y.o.   MRN: [id number]  HPI  Pt here today to reassess her ongoing need for a power wheelchair, and the fact that her current one needs replacement. Minna has been diagnosed with muscular atrophy since the age of 85 months. In this progressive disease, untreatable disease, she continues to lose muscle mass and strength,both in her upper and lower extremities, as well as the muscles of her trunk. She has a lifetime need for a mobility device which will afford her maximum independence and mobility, she continues to exceed in her accomplishments in school and is currently a part time college student . Her current wheelchair is over 71 years old, and no longer functions safely. Charging now needs to be done daily, in the past every 2 to 3 days was adequate, also at times, the wheelchair just stops, despite being charged, which is clearly a safety hazard. The left foot rest is partially broken, and the upholstery is clearly worn and in parts torn from long use. Mother and patient  Report that the seat elevator can no longer work consistently without someone other than Marilea, manually adjusting wires at the back of the chair, therefore preventing the independence that [place] needs. She continues to need a seat elevator, and will do so for life, duty to weakness in the paraspinal muscles which will progress. Mobility of head and neck is therefore severely compromised , and from being able to work on surfaces at different levels around the home, to being able to see the screen/blackboard in the classroom, she needs to have help adjusting the height of her head  And  body  Review of Systems Pt has no medical complaints at this visit. ROs is negative for infection, chest pain, constipation , or poor appetite or poor sleep.     Objective:   Physical Exam  Patient alert and oriented and in no cardiopulmonary distress.Wheelchair  dependent  HEENT: No facial asymmetry, EOMI, no sinus tenderness,  oropharynx pink and moist.  Neck decreased ROM  no adenopathy.  Chest: Clear to auscultation bilaterally.  CVS: S1, S2 no murmurs, no S3.  ABD: Soft non tender. Bowel sounds normal.  Ext: No edema  MS: decreased  ROM spine, shoulders, hips and knees.  Skin: Intact, no ulcerations or rash noted.  Psych: Good eye contact, normal affect. Memory intact not anxious or depressed appearing.  CNS: CN 2-12 intact, power and tone decreased in upper and lower extremities       Assessment & Plan:

## 2013-06-20 NOTE — Patient Instructions (Signed)
F/u in  4 month, call if you need me before  You will have prepared 2 scripts for vocational rehab, power wheelchair, and for seat elevator as being medically necessary Vocational rehab will also get a letter as to why it is medically necessary for you to have a seat elevator.  National seating and  Mobility will get this face to face visit , which will explain why you continue to need a power wheelchair and the fact that the current one is over 21 years old and is faulty and a safety risk

## 2013-06-21 ENCOUNTER — Other Ambulatory Visit: Payer: Self-pay | Admitting: Family Medicine

## 2013-06-21 LAB — BASIC METABOLIC PANEL
BUN: 11 mg/dL (ref 6–23)
CO2: 28 mEq/L (ref 19–32)
Calcium: 9.7 mg/dL (ref 8.4–10.5)
Chloride: 102 mEq/L (ref 96–112)
Sodium: 139 mEq/L (ref 135–145)

## 2013-06-21 LAB — LIPID PANEL
HDL: 39 mg/dL — ABNORMAL LOW (ref >39)
Total CHOL/HDL Ratio: 4.7 Ratio

## 2013-06-21 LAB — TSH: TSH: 1.229 u[IU]/mL (ref 0.350–4.500)

## 2013-06-23 ENCOUNTER — Encounter: Payer: Self-pay | Admitting: Family Medicine

## 2013-06-24 NOTE — Assessment & Plan Note (Signed)
Pt with spinal muscular atrophy who requires a mobility device lifelong which will afford her maximal independence and level of function. Janet Acevedo has excelled academically, has an excellent attitude to life  Will continue to  do the best that she can to live a full and productive life.  Current wheelchair is old, does not function safely, is therefore a safety hazard and needs replacement. Ongoing need for a seat elevator mandates the provision of this for the rest of her life, as this  Will continue  enhance the quality of her life affording her as much independence  As possible

## 2013-06-30 ENCOUNTER — Telehealth: Payer: Self-pay

## 2013-06-30 MED ORDER — BENZONATATE 100 MG PO CAPS: 100.0000 mg | ORAL_CAPSULE | Freq: Three times a day (TID) | ORAL | Status: AC | PRN

## 2013-06-30 MED ORDER — LEVOFLOXACIN 500 MG PO TABS: 500.0000 mg | ORAL_TABLET | Freq: Every day | ORAL | Status: AC

## 2013-06-30 NOTE — Telephone Encounter (Signed)
levaquin and tessalon perles have been sen to her pharmacy pls let her know

## 2013-07-03 NOTE — Telephone Encounter (Signed)
Patient aware.

## 2013-07-10 ENCOUNTER — Telehealth: Payer: Self-pay | Admitting: Family Medicine

## 2013-07-10 DIAGNOSIS — M79675 Pain in left toe(s): Secondary | ICD-10-CM

## 2013-07-10 NOTE — Addendum Note (Signed)
Addended by: Syliva Overman E on: 07/10/2013 08:10 PM   Modules accepted: Orders

## 2013-07-10 NOTE — Telephone Encounter (Signed)
Has been having pain (throbbing) in her left pinky toe for about a month. Hurts to touch it. No injury to toe and no discoloration seen. Wants to know if you wanted to treat it or refer her somewhere. Please advise

## 2013-07-10 NOTE — Telephone Encounter (Signed)
i recommend podiatry eval. Has recently been on antibiotics for resp infection , still having pain, I am entering a referral she can expec a call from referral staff, pls let her know

## 2013-07-11 NOTE — Telephone Encounter (Signed)
Patient aware.

## 2013-07-12 DIAGNOSIS — G121 Other inherited spinal muscular atrophy: Secondary | ICD-10-CM | POA: Diagnosis not present

## 2013-07-12 DIAGNOSIS — K219 Gastro-esophageal reflux disease without esophagitis: Secondary | ICD-10-CM | POA: Diagnosis not present

## 2013-07-12 DIAGNOSIS — J45909 Unspecified asthma, uncomplicated: Secondary | ICD-10-CM | POA: Diagnosis not present

## 2013-07-12 DIAGNOSIS — J984 Other disorders of lung: Secondary | ICD-10-CM | POA: Diagnosis not present

## 2013-07-15 DIAGNOSIS — J329 Chronic sinusitis, unspecified: Secondary | ICD-10-CM | POA: Diagnosis not present

## 2013-07-15 DIAGNOSIS — Z2089 Contact with and (suspected) exposure to other communicable diseases: Secondary | ICD-10-CM | POA: Diagnosis not present

## 2013-07-18 DIAGNOSIS — M2040 Other hammer toe(s) (acquired), unspecified foot: Secondary | ICD-10-CM | POA: Diagnosis not present

## 2013-07-25 ENCOUNTER — Ambulatory Visit: Payer: Medicare Other | Admitting: Family Medicine

## 2013-08-03 ENCOUNTER — Encounter: Payer: Self-pay | Admitting: Family Medicine

## 2013-08-04 ENCOUNTER — Telehealth: Payer: Self-pay

## 2013-08-04 ENCOUNTER — Other Ambulatory Visit: Payer: Self-pay

## 2013-08-04 MED ORDER — PREDNISONE (PAK) 5 MG PO TABS: ORAL_TABLET | ORAL | Status: DC

## 2013-08-04 MED ORDER — AZITHROMYCIN 250 MG PO TABS: ORAL_TABLET | ORAL | Status: AC

## 2013-08-04 NOTE — Telephone Encounter (Signed)
Janet Acevedo states she was going to take Janet Acevedo to UC today but her foot rest broke and she can't take her. Someone is coming this evening to fix chair but Janet Acevedo isn't feeling well. For 3 days, headache, sinus pain/pressure, clear thick mucus. Bilateral earache, and sore throat, some coughing but only producing thick clear phlegm. Some SOB, using neb now. Uses [facility]

## 2013-08-04 NOTE — Telephone Encounter (Signed)
pls senz in a z pack x 1 and a pred 5mg  dose pack x 6 days  Only #21 tabs and let them know I hope she feels better, if not come in next week for appt call Monday

## 2013-08-04 NOTE — Telephone Encounter (Signed)
Mother aware and meds sent

## 2013-08-14 ENCOUNTER — Other Ambulatory Visit: Payer: Self-pay | Admitting: Family Medicine

## 2013-08-14 MED ORDER — BUDESONIDE-FORMOTEROL FUMARATE 80-4.5 MCG/ACT IN AERO: 2.0000 | INHALATION_SPRAY | Freq: Two times a day (BID) | RESPIRATORY_TRACT | Status: DC

## 2013-08-17 ENCOUNTER — Other Ambulatory Visit: Payer: Self-pay | Admitting: Family Medicine

## 2013-08-21 ENCOUNTER — Encounter: Payer: Self-pay | Admitting: Family Medicine

## 2013-08-21 ENCOUNTER — Ambulatory Visit (HOSPITAL_COMMUNITY)
Admission: RE | Admit: 2013-08-21 | Discharge: 2013-08-21 | Disposition: A | Discharge status: Home / Self Care | Payer: Medicare Other | Source: Ambulatory Visit | Attending: Family Medicine | Admitting: Family Medicine

## 2013-08-21 ENCOUNTER — Ambulatory Visit (INDEPENDENT_AMBULATORY_CARE_PROVIDER_SITE_OTHER): Payer: Medicare Other | Admitting: Family Medicine

## 2013-08-21 ENCOUNTER — Other Ambulatory Visit: Payer: Self-pay | Admitting: Family Medicine

## 2013-08-21 VITALS — BP 118/78 | HR 101 | Temp 99.3°F | Resp 16

## 2013-08-21 DIAGNOSIS — B9689 Other specified bacterial agents as the cause of diseases classified elsewhere: Secondary | ICD-10-CM | POA: Insufficient documentation

## 2013-08-21 DIAGNOSIS — J208 Acute bronchitis due to other specified organisms: Principal | ICD-10-CM

## 2013-08-21 DIAGNOSIS — K219 Gastro-esophageal reflux disease without esophagitis: Secondary | ICD-10-CM

## 2013-08-21 DIAGNOSIS — R05 Cough: Secondary | ICD-10-CM | POA: Insufficient documentation

## 2013-08-21 DIAGNOSIS — R059 Cough, unspecified: Secondary | ICD-10-CM | POA: Insufficient documentation

## 2013-08-21 DIAGNOSIS — R0989 Other specified symptoms and signs involving the circulatory and respiratory systems: Secondary | ICD-10-CM | POA: Diagnosis not present

## 2013-08-21 DIAGNOSIS — J0110 Acute frontal sinusitis, unspecified: Secondary | ICD-10-CM

## 2013-08-21 DIAGNOSIS — J309 Allergic rhinitis, unspecified: Secondary | ICD-10-CM | POA: Diagnosis not present

## 2013-08-21 DIAGNOSIS — J45909 Unspecified asthma, uncomplicated: Secondary | ICD-10-CM

## 2013-08-21 DIAGNOSIS — J209 Acute bronchitis, unspecified: Secondary | ICD-10-CM | POA: Diagnosis not present

## 2013-08-21 DIAGNOSIS — A499 Bacterial infection, unspecified: Secondary | ICD-10-CM | POA: Diagnosis not present

## 2013-08-21 LAB — CBC
HCT: 40 % (ref 36.0–46.0)
Hemoglobin: 13.3 g/dL (ref 12.0–15.0)
MCH: 29 pg (ref 26.0–34.0)
MCHC: 33.3 g/dL (ref 30.0–36.0)
MCV: 87.1 fL (ref 78.0–100.0)
Platelets: 225 10*3/uL (ref 150–400)
RBC: 4.59 MIL/uL (ref 3.87–5.11)
RDW: 14.4 % (ref 11.5–15.5)
WBC: 9.2 10*3/uL (ref 4.0–10.5)

## 2013-08-21 MED ORDER — PREDNISONE 5 MG PO TABS: 5.0000 mg | ORAL_TABLET | Freq: Two times a day (BID) | ORAL | Status: AC

## 2013-08-21 MED ORDER — PENICILLIN V POTASSIUM 500 MG PO TABS: 500.0000 mg | ORAL_TABLET | Freq: Three times a day (TID) | ORAL | Status: DC

## 2013-08-21 MED ORDER — BENZONATATE 100 MG PO CAPS: 100.0000 mg | ORAL_CAPSULE | Freq: Four times a day (QID) | ORAL | Status: DC | PRN

## 2013-08-21 MED ORDER — PROMETHAZINE-DM 6.25-15 MG/5ML PO SYRP: ORAL_SOLUTION | ORAL | Status: DC

## 2013-08-21 NOTE — Patient Instructions (Addendum)
F/U as before  You are being treated for sinusitis and bronchitis.  cXr today  CBC and chem 7 today, and sputum will be sent  Rocephin 500mg  IM today. Tessalon perles , cough suppressant medication and 10 day antibiotic copurse are prescribed  Excuse form school from 1/26 to return 08/22/2013

## 2013-08-22 ENCOUNTER — Encounter: Payer: Self-pay | Admitting: Family Medicine

## 2013-08-22 DIAGNOSIS — A499 Bacterial infection, unspecified: Secondary | ICD-10-CM

## 2013-08-22 DIAGNOSIS — B9689 Other specified bacterial agents as the cause of diseases classified elsewhere: Secondary | ICD-10-CM

## 2013-08-22 DIAGNOSIS — J209 Acute bronchitis, unspecified: Secondary | ICD-10-CM | POA: Diagnosis not present

## 2013-08-22 LAB — BASIC METABOLIC PANEL
BUN: 7 mg/dL (ref 6–23)
CALCIUM: 8.9 mg/dL (ref 8.4–10.5)
CO2: 23 mEq/L (ref 19–32)
Chloride: 103 mEq/L (ref 96–112)
Creat: 0.14 mg/dL — ABNORMAL LOW (ref 0.50–1.10)
Glucose, Bld: 65 mg/dL — ABNORMAL LOW (ref 70–99)
POTASSIUM: 4.3 meq/L (ref 3.5–5.3)
SODIUM: 139 meq/L (ref 135–145)

## 2013-08-22 MED ORDER — CEFTRIAXONE SODIUM 1 G IJ SOLR
500.0000 mg | Freq: Once | INTRAMUSCULAR | Status: AC
Start: 2013-08-22 — End: 2013-08-22
  Administered 2013-08-22: 500 mg via INTRAMUSCULAR

## 2013-08-24 ENCOUNTER — Encounter: Payer: Self-pay | Admitting: Family Medicine

## 2013-08-24 LAB — RESPIRATORY CULTURE OR RESPIRATORY AND SPUTUM CULTURE: Organism ID, Bacteria: NORMAL

## 2013-08-28 DIAGNOSIS — J0110 Acute frontal sinusitis, unspecified: Secondary | ICD-10-CM | POA: Insufficient documentation

## 2013-08-28 NOTE — Progress Notes (Signed)
   Subjective:    Patient ID: Janet Acevedo, female    DOB: [birth year], 23 y.o.   MRN: [id number]  HPI 3 day h/o cough , head congestion, chills, fatigue due to poor sleep from excessive cough, sputum is grey green. Pt has poor appetite due to feeing ill, unable to attend school/college today due to symptoms. Has been on 2 different courses of antibiotic in the past  Weeks for respiratory symptoms,and the household in which she lives has been plagued by  Respiratory illness in many of the members over the past 3 to 5 months. C/o chest wall pain with cough or deep breathing    Review of Systems See HPI  Denies PND  palpitations and leg swelling Denies abdominal pain, nausea, vomiting,diarrhea or constipation.  Poor appetite  Denies dysuria, frequency, hesitancy or incontinence. Generalized body pains  And chronic  limitation in mobility.Wheelchair dependence Denies , seizures, numbness, or tingling. Denies depression, anxiety or insomnia. Denies skin break down or rash.        Objective:   Physical Exam        Assessment & Plan:

## 2013-08-28 NOTE — Assessment & Plan Note (Signed)
Acute infection, antibiotic course prescribed, and pt encouraged to do saline nasal flushes as able

## 2013-08-28 NOTE — Assessment & Plan Note (Signed)
Continue daily maintenance therapy 

## 2013-08-28 NOTE — Assessment & Plan Note (Signed)
Continue daily maintenance therapy

## 2013-08-28 NOTE — Assessment & Plan Note (Signed)
No current flare despite acute infection, continue maintenance therapy

## 2013-08-28 NOTE — Assessment & Plan Note (Signed)
Rocephin in office followed by antibiotic course, decongestant and cough suppressant. CXr and labs today. School excuse

## 2013-10-16 ENCOUNTER — Telehealth: Payer: Self-pay

## 2013-10-16 ENCOUNTER — Ambulatory Visit: Payer: Medicare Other | Admitting: Family Medicine

## 2013-10-16 MED ORDER — OSELTAMIVIR PHOSPHATE 75 MG PO CAPS: 75.0000 mg | ORAL_CAPSULE | Freq: Two times a day (BID) | ORAL | Status: DC

## 2013-10-16 NOTE — Telephone Encounter (Signed)
Acute onset of fever yes Headache yes Body ache yes Fatigue yes Non productive cough yes Sore throat no Nasal discharge no  Onset between 1-4 days of possible exposure yes  Recommendation:  Fluid, rest, Tylenol for control of fever  Expect 5 days before feeling better and not so contagious and generally better in 7-10 days.  Standard treatment Tama flu 75 mg 1 tablet twice daily times 5 days  Please call office if symptoms do not improve or worsen in 5 days after beginning treatment.

## 2013-10-16 NOTE — Telephone Encounter (Signed)
Noted and agree. 

## 2013-10-19 ENCOUNTER — Telehealth: Payer: Self-pay

## 2013-10-19 NOTE — Telephone Encounter (Signed)
Pt will collect letter

## 2013-10-19 NOTE — Telephone Encounter (Signed)
Patient recently had flu. Was called in tamiflu from [place]. Wants school note from March 23-26th. Will go back tomorrow. Please advise

## 2013-10-19 NOTE — Telephone Encounter (Signed)
Ok to write note of excuse

## 2013-11-21 ENCOUNTER — Other Ambulatory Visit: Payer: Self-pay | Admitting: Family Medicine

## 2013-11-22 DIAGNOSIS — J45909 Unspecified asthma, uncomplicated: Secondary | ICD-10-CM | POA: Diagnosis not present

## 2013-11-22 DIAGNOSIS — J984 Other disorders of lung: Secondary | ICD-10-CM | POA: Diagnosis not present

## 2013-11-22 DIAGNOSIS — G121 Other inherited spinal muscular atrophy: Secondary | ICD-10-CM | POA: Diagnosis not present

## 2013-11-22 DIAGNOSIS — Z9109 Other allergy status, other than to drugs and biological substances: Secondary | ICD-10-CM | POA: Diagnosis not present

## 2013-11-22 DIAGNOSIS — G4721 Circadian rhythm sleep disorder, delayed sleep phase type: Secondary | ICD-10-CM | POA: Diagnosis not present

## 2013-12-01 ENCOUNTER — Telehealth: Payer: Self-pay

## 2013-12-01 ENCOUNTER — Other Ambulatory Visit: Payer: Self-pay

## 2013-12-01 ENCOUNTER — Other Ambulatory Visit: Payer: Self-pay | Admitting: Family Medicine

## 2013-12-01 MED ORDER — LANSOPRAZOLE 30 MG PO CPDR: DELAYED_RELEASE_CAPSULE | ORAL | Status: DC

## 2013-12-01 NOTE — Telephone Encounter (Signed)
Med sent in and will wait for PA form to be faxed if needed

## 2013-12-01 NOTE — Telephone Encounter (Signed)
Patient states protonix isn't working anymore. Used to take prevacid but had to switch because insurance wouldn't cover it. Wants it sent in again to see if its covered now. Please advise

## 2013-12-01 NOTE — Telephone Encounter (Signed)
Documented so try to PA the prevacid

## 2013-12-13 ENCOUNTER — Other Ambulatory Visit: Payer: Self-pay | Admitting: Family Medicine

## 2013-12-25 ENCOUNTER — Other Ambulatory Visit: Payer: Self-pay | Admitting: Family Medicine

## 2013-12-27 DIAGNOSIS — L219 Seborrheic dermatitis, unspecified: Secondary | ICD-10-CM | POA: Diagnosis not present

## 2014-01-09 ENCOUNTER — Other Ambulatory Visit: Payer: Self-pay | Admitting: Family Medicine

## 2014-01-23 DIAGNOSIS — L219 Seborrheic dermatitis, unspecified: Secondary | ICD-10-CM | POA: Diagnosis not present

## 2014-02-12 ENCOUNTER — Encounter: Payer: Self-pay | Admitting: Family Medicine

## 2014-02-12 DIAGNOSIS — R3981 Functional urinary incontinence: Secondary | ICD-10-CM | POA: Insufficient documentation

## 2014-02-27 ENCOUNTER — Other Ambulatory Visit: Payer: Self-pay | Admitting: Family Medicine

## 2014-03-01 DIAGNOSIS — G121 Other inherited spinal muscular atrophy: Secondary | ICD-10-CM | POA: Diagnosis not present

## 2014-03-01 DIAGNOSIS — J984 Other disorders of lung: Secondary | ICD-10-CM | POA: Diagnosis not present

## 2014-03-31 ENCOUNTER — Other Ambulatory Visit: Payer: Self-pay | Admitting: Family Medicine

## 2014-04-05 ENCOUNTER — Other Ambulatory Visit: Payer: Self-pay | Admitting: Family Medicine

## 2014-04-09 ENCOUNTER — Telehealth: Payer: Self-pay | Admitting: Family Medicine

## 2014-04-09 NOTE — Telephone Encounter (Signed)
pls type the letter whic I believe you had kept from visit last week. Due to pts health , which causes her to be paraplegic , she would benefit, I will sign

## 2014-04-09 NOTE — Telephone Encounter (Signed)
Mother would like to know if she can receive a statement that patient would benefit from the use of an electric lift due to the physical health of caregiver.   Please advise.

## 2014-04-10 NOTE — Telephone Encounter (Signed)
Letter composed and ready for signature.

## 2014-06-06 ENCOUNTER — Telehealth: Payer: Self-pay

## 2014-06-06 ENCOUNTER — Other Ambulatory Visit: Payer: Self-pay | Admitting: Family Medicine

## 2014-06-06 DIAGNOSIS — J069 Acute upper respiratory infection, unspecified: Secondary | ICD-10-CM | POA: Diagnosis not present

## 2014-06-06 MED ORDER — BENZONATATE 100 MG PO CAPS: 100.0000 mg | ORAL_CAPSULE | Freq: Three times a day (TID) | ORAL | Status: DC | PRN

## 2014-06-06 MED ORDER — PENICILLIN V POTASSIUM 500 MG PO TABS: 500.0000 mg | ORAL_TABLET | Freq: Three times a day (TID) | ORAL | Status: DC

## 2014-06-06 NOTE — Telephone Encounter (Signed)
Called and left message for mother and daughter that meds have been sent.

## 2014-06-06 NOTE — Telephone Encounter (Signed)
pls advise I am sending in penicillln for 10 days. Will likely need a work in appt in am so she can get school excuse , sounds as if she is sick enough, let her get this if she needs it also please  If not needs to call back next week if nop better or go to UC over weekend if needed

## 2014-06-07 NOTE — Telephone Encounter (Signed)
Called and left additional message for mother to return call.

## 2014-06-08 NOTE — Telephone Encounter (Signed)
Unable to reach patient.  Will await return call

## 2014-06-09 DIAGNOSIS — K429 Umbilical hernia without obstruction or gangrene: Secondary | ICD-10-CM | POA: Diagnosis not present

## 2014-06-09 DIAGNOSIS — Z7951 Long term (current) use of inhaled steroids: Secondary | ICD-10-CM | POA: Diagnosis not present

## 2014-06-09 DIAGNOSIS — R319 Hematuria, unspecified: Secondary | ICD-10-CM | POA: Diagnosis not present

## 2014-06-09 DIAGNOSIS — I700 Atherosclerosis of aorta: Secondary | ICD-10-CM | POA: Diagnosis not present

## 2014-06-09 DIAGNOSIS — R110 Nausea: Secondary | ICD-10-CM | POA: Diagnosis not present

## 2014-06-09 DIAGNOSIS — Z975 Presence of (intrauterine) contraceptive device: Secondary | ICD-10-CM | POA: Diagnosis not present

## 2014-06-09 DIAGNOSIS — K760 Fatty (change of) liver, not elsewhere classified: Secondary | ICD-10-CM | POA: Diagnosis not present

## 2014-06-09 DIAGNOSIS — R1031 Right lower quadrant pain: Secondary | ICD-10-CM | POA: Diagnosis not present

## 2014-06-09 DIAGNOSIS — R160 Hepatomegaly, not elsewhere classified: Secondary | ICD-10-CM | POA: Diagnosis not present

## 2014-06-09 DIAGNOSIS — Z79899 Other long term (current) drug therapy: Secondary | ICD-10-CM | POA: Diagnosis not present

## 2014-07-02 ENCOUNTER — Other Ambulatory Visit: Payer: Self-pay | Admitting: Family Medicine

## 2014-07-09 ENCOUNTER — Other Ambulatory Visit: Payer: Self-pay | Admitting: Family Medicine

## 2014-07-09 ENCOUNTER — Ambulatory Visit: Payer: Medicare Other | Admitting: Family Medicine

## 2014-07-12 ENCOUNTER — Encounter: Payer: Self-pay | Admitting: Family Medicine

## 2014-07-12 ENCOUNTER — Ambulatory Visit (INDEPENDENT_AMBULATORY_CARE_PROVIDER_SITE_OTHER): Payer: Medicare Other | Admitting: Family Medicine

## 2014-07-12 ENCOUNTER — Ambulatory Visit (INDEPENDENT_AMBULATORY_CARE_PROVIDER_SITE_OTHER): Payer: Medicare Other

## 2014-07-12 VITALS — BP 106/74 | HR 100 | Resp 18

## 2014-07-12 DIAGNOSIS — E669 Obesity, unspecified: Secondary | ICD-10-CM

## 2014-07-12 DIAGNOSIS — E559 Vitamin D deficiency, unspecified: Secondary | ICD-10-CM

## 2014-07-12 DIAGNOSIS — G129 Spinal muscular atrophy, unspecified: Secondary | ICD-10-CM | POA: Diagnosis not present

## 2014-07-12 DIAGNOSIS — J302 Other seasonal allergic rhinitis: Secondary | ICD-10-CM | POA: Diagnosis not present

## 2014-07-12 DIAGNOSIS — R310 Gross hematuria: Secondary | ICD-10-CM | POA: Diagnosis not present

## 2014-07-12 DIAGNOSIS — R7302 Impaired glucose tolerance (oral): Secondary | ICD-10-CM | POA: Diagnosis not present

## 2014-07-12 DIAGNOSIS — Z13220 Encounter for screening for lipoid disorders: Secondary | ICD-10-CM | POA: Diagnosis not present

## 2014-07-12 DIAGNOSIS — M6281 Muscle weakness (generalized): Secondary | ICD-10-CM | POA: Diagnosis not present

## 2014-07-12 DIAGNOSIS — J4520 Mild intermittent asthma, uncomplicated: Secondary | ICD-10-CM

## 2014-07-12 DIAGNOSIS — Z23 Encounter for immunization: Secondary | ICD-10-CM

## 2014-07-12 NOTE — Assessment & Plan Note (Signed)
Vaccine administered at visit.  

## 2014-07-12 NOTE — Patient Instructions (Addendum)
Annual wellness in 4 month, call if you need me before  Flu vaccine today  You are referred to urology at  [facility] for further eval;uation from recent ED  Visit   Fasting lipid, TSH, CBC,  , HBa1C and vit D and chem 7 as soon as possible  We will try to get access to a scale so that you can be weighed, nurse will contact you about this

## 2014-07-12 NOTE — Assessment & Plan Note (Signed)
Episode of acute abdominal pain rated at a 10 plus, took her to [facility] ED, question of UPJ obstruction mild on scan and hematuria, pt has no menses, will refer to urology at [facility] for furhter eval

## 2014-07-15 NOTE — Progress Notes (Signed)
   Subjective:    Patient ID: Janet Acevedo, female    DOB: [birth year], 23 y.o.   MRN: [id number]  HPI Pt in for follow up of recent ED visit , when she presented wir th severe RLQ  Abdominal pain relieved only with morphine in the ED. Studies done in the Ed are reviewed, pertinent positives, noted are hematuria and  questionable ureteral dilation on right side, she had CT scan of abdomen and pelvis as well as RUQ ultrasound Pt has no menses, hence hematuria was somewhat alarming. She had no UTI Climmie had been seen in the urgent care a few days prior to this and treated for URI, on antibiotics Prior to this she has been doing well, and will finish [employer] in 2 semesters Needs flu vaccine and fasting lipid and hBa1c   Review of Systems See HPI  Denies chest pains, palpitations and leg swelling Denies  nausea, vomiting,diarrhea or constipation.   Denies dysuria, frequency, hesitancy or incontinence. Paraplegic due to muscular atrophy Denies headaches, seizures, numbness, or tingling. Denies depression, anxiety or insomnia. Denies skin break down or rash.        Objective:   Physical Exam BP 106/74 mmHg  Pulse 100  Resp 18  SpO2 100% Patient alert and oriented and in no cardiopulmonary distress.Wheelchair dependent due to spinal muscular atrophy  HEENT: No facial asymmetry, EOMI,   oropharynx pink and moist.  Neck decreased ROM no JVD, no mass.Sinuses non tender, TM clear  Chest: Clear to auscultation bilaterally.  CVS: S1, S2 no murmurs, no S3.Regular rate.  ABD: Soft non tender.   Ext: No edema  MS: decreased  ROM spine, shoulders, hips and knees.  Skin: Intact, no ulcerations or rash noted.  Psych: Good eye contact, normal affect. Memory intact not anxious or depressed appearing.  CNS: CN 2-12 intact, normal UE power, grade 1 to2 power in lower extremities        Assessment & Plan:  Hematuria, gross Episode of acute abdominal pain rated at a 10 plus, took her  to [facility] ED, question of UPJ obstruction mild on scan and hematuria, pt has no menses, will refer to urology at [facility] for furhter eval  Need for prophylactic vaccination and inoculation against influenza Vaccine administered at visit.   Obesity Unchnaged. Patient re-educated about   The importance of healthy food choices with portion control discussed. Need to obtain pt's weight , will check into access  Abdominal girth measured and is 44 in, she is aware of need for dietary change needed  Seasonal allergies Controlled, no change in medication   Asthma, chronic Controlled, no change in medication   Muscular atrophy, proximal spinal, chronic Wheelchair dependent , however, is functioning extremely well , with her mother's dedication Will complete community college next year and intends to continue he education

## 2014-07-15 NOTE — Assessment & Plan Note (Signed)
Wheelchair dependent , however, is functioning extremely well , with her mother's dedication Will complete community college next year and intends to continue he education

## 2014-07-15 NOTE — Assessment & Plan Note (Signed)
Controlled, no change in medication  

## 2014-07-15 NOTE — Assessment & Plan Note (Signed)
Unchnaged. Patient re-educated about   The importance of healthy food choices with portion control discussed. Need to obtain pt's weight , will check into access  Abdominal girth measured and is 44 in, she is aware of need for dietary change needed

## 2014-07-17 DIAGNOSIS — R310 Gross hematuria: Secondary | ICD-10-CM | POA: Diagnosis not present

## 2014-07-17 DIAGNOSIS — N135 Crossing vessel and stricture of ureter without hydronephrosis: Secondary | ICD-10-CM | POA: Diagnosis not present

## 2014-08-01 DIAGNOSIS — R310 Gross hematuria: Secondary | ICD-10-CM | POA: Diagnosis not present

## 2014-08-02 DIAGNOSIS — N135 Crossing vessel and stricture of ureter without hydronephrosis: Secondary | ICD-10-CM | POA: Diagnosis not present

## 2014-08-04 ENCOUNTER — Other Ambulatory Visit: Payer: Self-pay | Admitting: Family Medicine

## 2014-08-06 ENCOUNTER — Other Ambulatory Visit: Payer: Self-pay | Admitting: Physician Assistant

## 2014-08-06 ENCOUNTER — Other Ambulatory Visit: Payer: Self-pay | Admitting: Family Medicine

## 2014-09-10 ENCOUNTER — Other Ambulatory Visit: Payer: Self-pay | Admitting: Family Medicine

## 2014-09-12 ENCOUNTER — Telehealth: Payer: Self-pay | Admitting: Family Medicine

## 2014-09-12 NOTE — Telephone Encounter (Signed)
Will fax for info from pharmacy

## 2014-09-13 NOTE — Telephone Encounter (Signed)
Vel[facility] Hatchet [facility] from [facility] before

## 2014-09-14 ENCOUNTER — Telehealth: Payer: Self-pay | Admitting: Family Medicine

## 2014-09-14 NOTE — Telephone Encounter (Signed)
PA's in process for Omnaris and Prevacid

## 2014-09-14 NOTE — Telephone Encounter (Signed)
PAs have been faxed back

## 2014-09-14 NOTE — Telephone Encounter (Signed)
Pa's have been faxed back

## 2014-09-19 ENCOUNTER — Other Ambulatory Visit: Payer: Self-pay

## 2014-09-19 MED ORDER — ALBUTEROL SULFATE HFA 108 (90 BASE) MCG/ACT IN AERS: 2.0000 | INHALATION_SPRAY | RESPIRATORY_TRACT | Status: DC | PRN

## 2014-09-27 ENCOUNTER — Other Ambulatory Visit: Payer: Self-pay

## 2014-09-27 MED ORDER — MOMETASONE FUROATE 50 MCG/ACT NA SUSP: 2.0000 | Freq: Every day | NASAL | Status: DC

## 2014-10-01 ENCOUNTER — Other Ambulatory Visit: Payer: Self-pay | Admitting: Physician Assistant

## 2014-10-02 ENCOUNTER — Other Ambulatory Visit: Payer: Self-pay | Admitting: Family Medicine

## 2014-10-03 ENCOUNTER — Telehealth: Payer: Self-pay

## 2014-10-03 MED ORDER — PREDNISONE (PAK) 5 MG PO TABS: 5.0000 mg | ORAL_TABLET | ORAL | Status: DC

## 2014-10-03 NOTE — Telephone Encounter (Signed)
States for the past week she has been suffering with sinuses and a cough with clear white phlegm and she has started getting wheezy. No fever yet but wants something called in for it. Please advise

## 2014-10-03 NOTE — Telephone Encounter (Signed)
Prednisone dose pack has been sent in, Mom is aware

## 2014-10-22 DIAGNOSIS — G4721 Circadian rhythm sleep disorder, delayed sleep phase type: Secondary | ICD-10-CM | POA: Diagnosis not present

## 2014-10-22 DIAGNOSIS — G121 Other inherited spinal muscular atrophy: Secondary | ICD-10-CM | POA: Diagnosis not present

## 2014-10-22 DIAGNOSIS — J4540 Moderate persistent asthma, uncomplicated: Secondary | ICD-10-CM | POA: Diagnosis not present

## 2014-10-22 DIAGNOSIS — J984 Other disorders of lung: Secondary | ICD-10-CM | POA: Diagnosis not present

## 2014-10-24 ENCOUNTER — Other Ambulatory Visit: Payer: Self-pay | Admitting: Family Medicine

## 2014-11-05 ENCOUNTER — Encounter: Payer: Medicare Other | Admitting: Family Medicine

## 2014-12-18 ENCOUNTER — Other Ambulatory Visit: Payer: Self-pay | Admitting: Family Medicine

## 2015-02-01 ENCOUNTER — Other Ambulatory Visit: Payer: Self-pay | Admitting: Family Medicine

## 2015-02-07 ENCOUNTER — Ambulatory Visit (INDEPENDENT_AMBULATORY_CARE_PROVIDER_SITE_OTHER): Payer: Medicare Other | Admitting: Family Medicine

## 2015-02-07 ENCOUNTER — Encounter: Payer: Self-pay | Admitting: Family Medicine

## 2015-02-07 VITALS — BP 126/72 | HR 99 | Resp 18

## 2015-02-07 DIAGNOSIS — G129 Spinal muscular atrophy, unspecified: Secondary | ICD-10-CM

## 2015-02-07 DIAGNOSIS — L218 Other seborrheic dermatitis: Secondary | ICD-10-CM | POA: Diagnosis not present

## 2015-02-07 DIAGNOSIS — J302 Other seasonal allergic rhinitis: Secondary | ICD-10-CM | POA: Diagnosis not present

## 2015-02-07 DIAGNOSIS — K219 Gastro-esophageal reflux disease without esophagitis: Secondary | ICD-10-CM

## 2015-02-07 DIAGNOSIS — J45909 Unspecified asthma, uncomplicated: Secondary | ICD-10-CM

## 2015-02-07 DIAGNOSIS — J069 Acute upper respiratory infection, unspecified: Secondary | ICD-10-CM

## 2015-02-07 DIAGNOSIS — L219 Seborrheic dermatitis, unspecified: Secondary | ICD-10-CM | POA: Insufficient documentation

## 2015-02-07 MED ORDER — AZELASTINE HCL 0.1 % NA SOLN: 2.0000 | Freq: Two times a day (BID) | NASAL | Status: DC

## 2015-02-07 MED ORDER — FLUOCINOLONE ACETONIDE 0.01 % OT OIL: TOPICAL_OIL | OTIC | Status: DC

## 2015-02-07 NOTE — Patient Instructions (Addendum)
Annual wellness in 3 month, call if you nee me before  Pls get labs within the week  Instead of nasonex ,astellin  has been sent in for allergies  Thanks for choosing [facility], we consider it a privelige to serve you.

## 2015-02-10 NOTE — Assessment & Plan Note (Signed)
Untreated with excess flaking, medication prescribed

## 2015-02-10 NOTE — Assessment & Plan Note (Signed)
Deteriorated. Patient re-educated about  the importance of commitment to a  minimum of 150 minutes of exercise per week.  The importance of healthy food choices with portion control discussed. Encouraged to start a food diary, count calories and to consider  joining a support group. Sample diet sheets offered. Goals set by the patient for the next several months.   Weight /BMI 06/14/2013  WEIGHT 155 lb  HEIGHT 5\' 6"   BMI 25.03 kg/m2    Current exercise per week 0 minutes.wheelchair dependent due to muscular atrophy

## 2015-02-10 NOTE — Progress Notes (Signed)
   Janet Acevedo     MRN: [id number]      DOB: 09-Mar-1991   HPI Janet Acevedo is here for follow up and re-evaluation of chronic medical conditions, medication management and review of any available recent lab and radiology data.  Preventive health is updated, specifically  Cancer screening and Immunization.   Questions or concerns regarding consultations or procedures which the PT has had in the interim are  addressed. The PT requests a change from nasonex as she finds it liquid , dripping down her throat No real effort to change diet with apparent weight gain, unable to exercise , quadriplegic. C/o excess flaking of scalp, unable to get her treatment as of this time  ROS Denies recent fever or chills. Denies sinus pressure, nasal congestion, ear pain or sore throat. Denies chest congestion, productive cough or wheezing. Denies chest pains, palpitations and leg swelling Denies abdominal pain, nausea, vomiting,diarrhea or constipation.   Denies dysuria, frequency, hesitancy or incontinence. Denies joint pain, swelling and has chronic  limitation in mobility. Denies headaches, seizures, numbness, or tingling. Denies depression, anxiety or insomnia.   PE  BP 126/72 mmHg  Pulse 99  Resp 18  SpO2 94%  Patient alert and oriented and in no cardiopulmonary distress.  HEENT: No facial asymmetry, EOMI,   oropharynx pink and moist.  Neck supple, decreased ROM no JVD, no mass.  Chest: Clear to auscultation bilaterally.  CVS: S1, S2 no murmurs, no S3.Regular rate.  ABD: Soft non tender. Obese  Ext: No edema  MS: decreased  ROM spine, shoulders, hips and knees.  Skin: Intact, no ulcerations or rash noted.  Psych: Good eye contact, normal affect. Memory intact not anxious or depressed appearing.  CNS: CN 2-12 intact, power,  Decreased in both upper extremities, absent in lower extremities  Assessment & Plan   Seasonal allergies Uncontrolled, c/o excess drainsge with nasonex, wants  to change to different med, will try astellin  Morbid obesity Deteriorated. Patient re-educated about  the importance of commitment to a  minimum of 150 minutes of exercise per week.  The importance of healthy food choices with portion control discussed. Encouraged to start a food diary, count calories and to consider  joining a support group. Sample diet sheets offered. Goals set by the patient for the next several months.   Weight /BMI 06/14/2013  WEIGHT 155 lb  HEIGHT 5\' 6"   BMI 25.03 kg/m2    Current exercise per week 0 minutes.wheelchair dependent due to muscular atrophy   Seborrheic dermatitis of scalp Untreated with excess flaking, medication prescribed  GERD Controlled, no change in medication   Muscular atrophy, proximal spinal, chronic Chronic debility, incapable of of ADL independently, however, mental health is excellent, no loss of brain function, and will graduate from community college  In December  Asthma occurring only with upper respiratory infection Due to muscular atrophy pt at risk of wheezing excessively with respiratory  Infection espescialy, uses inhalers prophylacticaly to help with airway patency, stable on current reginme

## 2015-02-10 NOTE — Assessment & Plan Note (Signed)
Chronic debility, incapable of of ADL independently, however, mental health is excellent, no loss of brain function, and will graduate from community college  In December

## 2015-02-10 NOTE — Assessment & Plan Note (Signed)
Due to muscular atrophy pt at risk of wheezing excessively with respiratory  Infection espescialy, uses inhalers prophylacticaly to help with airway patency, stable on current reginme

## 2015-02-10 NOTE — Assessment & Plan Note (Addendum)
Uncontrolled, c/o excess drainsge with nasonex, wants to change to different med, will try astellin

## 2015-02-10 NOTE — Assessment & Plan Note (Signed)
Controlled, no change in medication  

## 2015-02-20 DIAGNOSIS — G129 Spinal muscular atrophy, unspecified: Secondary | ICD-10-CM | POA: Diagnosis not present

## 2015-02-20 DIAGNOSIS — R7302 Impaired glucose tolerance (oral): Secondary | ICD-10-CM | POA: Diagnosis not present

## 2015-02-20 DIAGNOSIS — M6281 Muscle weakness (generalized): Secondary | ICD-10-CM | POA: Diagnosis not present

## 2015-02-20 DIAGNOSIS — Z13220 Encounter for screening for lipoid disorders: Secondary | ICD-10-CM | POA: Diagnosis not present

## 2015-02-20 DIAGNOSIS — R310 Gross hematuria: Secondary | ICD-10-CM | POA: Diagnosis not present

## 2015-02-20 DIAGNOSIS — E559 Vitamin D deficiency, unspecified: Secondary | ICD-10-CM | POA: Diagnosis not present

## 2015-02-20 DIAGNOSIS — E669 Obesity, unspecified: Secondary | ICD-10-CM | POA: Diagnosis not present

## 2015-03-06 ENCOUNTER — Telehealth: Payer: Self-pay | Admitting: Family Medicine

## 2015-03-06 NOTE — Telephone Encounter (Signed)
Patient is calling asking about the medication sluocinolone aceponide topical scalp oil, she states the insurance is saying she needs authorization on this medication please advise?

## 2015-03-13 ENCOUNTER — Telehealth: Payer: Self-pay | Admitting: Family Medicine

## 2015-03-13 MED ORDER — DERMA-SMOOTHE/FS SCALP 0.01 % EX OIL: TOPICAL_OIL | CUTANEOUS | Status: DC

## 2015-03-13 NOTE — Telephone Encounter (Signed)
The scalp oil was denied.  Do you want to send her to dermatology or is there something that it can be substituted with?   Formulary in box

## 2015-03-13 NOTE — Telephone Encounter (Signed)
I printed "derma smoothe" which is preferred on the [facility] list and both are in your area

## 2015-03-13 NOTE — Telephone Encounter (Signed)
This is a topical steroid in oil form, request alterntive list from her insurance she has sebborheic dermatitis , should be able top PA

## 2015-03-14 NOTE — Telephone Encounter (Signed)
Called and left message for patient notifying of rx.

## 2015-03-14 NOTE — Telephone Encounter (Signed)
See next telephone message. 

## 2015-03-15 NOTE — Telephone Encounter (Signed)
Scri[pt prepared to be faxed for pt

## 2015-04-09 ENCOUNTER — Encounter: Payer: Self-pay | Admitting: Family Medicine

## 2015-04-09 ENCOUNTER — Ambulatory Visit (INDEPENDENT_AMBULATORY_CARE_PROVIDER_SITE_OTHER): Payer: Medicare Other | Admitting: Family Medicine

## 2015-04-09 ENCOUNTER — Other Ambulatory Visit: Payer: Self-pay | Admitting: Family Medicine

## 2015-04-09 VITALS — BP 122/78 | HR 86 | Resp 16 | Ht 66.0 in

## 2015-04-09 DIAGNOSIS — Z23 Encounter for immunization: Secondary | ICD-10-CM

## 2015-04-09 DIAGNOSIS — Z0000 Encounter for general adult medical examination without abnormal findings: Secondary | ICD-10-CM | POA: Diagnosis not present

## 2015-04-09 NOTE — Progress Notes (Signed)
Subjective:    Patient ID: Janet Acevedo, female    DOB: 07/19/1991, 24 y.o.   MRN: [id number]  HPI Preventive Screening-Counseling & Management   Patient present here today for a Medicare annual wellness visit.   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 Has muscular atrophy since birth, never smoker, or drug use   Risk Factors  Current exercise habits: Encouraged to exercise upper body as able   Dietary issues discussed: Low fat, low carb, heart healthy    Cardiac risk factors: None   Depression Screen  (Note: if answer to either of the following is "Yes", a more complete depression screening is indicated)   Over the past two weeks, have you felt down, depressed or hopeless? No  Over the past two weeks, have you felt little interest or pleasure in doing things? No  Have you lost interest or pleasure in daily life? No  Do you often feel hopeless? No  Do you cry easily over simple problems? No   Activities of Daily Living  In your present state of health, do you have any difficulty performing the following activities?  Driving?: doesn't drive Managing money?: No Feeding yourself?:No Getting from bed to chair?: needs assistance with transfers Climbing a flight of stairs?: unable Preparing food and eating?: needs some help with food preparation Bathing or showering?: yes Getting dressed?: needs assistance Getting to the toilet?: yes Using the toilet?: yes Moving around from place to place?: yes  Fall Risk Assessment In the past year have you fallen or had a near fall?:No Are you currently taking any medications that make you dizzy?:No   Hearing Difficulties: No Do you often ask people to speak up or repeat themselves?:No Do you experience ringing or noises in your ears?:No Do you have difficulty  understanding soft or whispered voices?:No  Cognitive Testing  Alert? Yes Normal Appearance?Yes  Oriented to person? Yes Place? Yes  Time? Yes  Displays appropriate judgment?Yes  Can read the correct time from a watch face? yes Are you having problems remembering things? No   Advanced Directives have been discussed with the patient? Yes, no living will but will give brochure    List the Names of Other Physician/Practitioners you currently use:  Pulmonary (Dr Aretha Parrot) Janet Acevedo any recent Medical Services you may have received from other than [facility] providers in the past year (date may be approximate).   Assessment:    Annual Wellness Exam   Plan:    Medicare Attestation  I have personally reviewed:  The patient's medical and social history  Their use of alcohol, tobacco or illicit drugs  Their current medications and supplements  The patient's functional ability including ADLs,fall risks, home safety risks, cognitive, and hearing and visual impairment  Diet and physical activities  Evidence for depression or mood disorders  The patient's weight, height, BMI, and visual acuity have been recorded in the chart. I have made referrals, counseling, and provided education to the patient based on review of the above and I have provided the patient with a written personalized care plan for preventive services.      Review of Systems     Objective:   Physical Exam  BP 122/78 mmHg  Pulse 86  Resp 16  SpO2 98%       Assessment & Plan:  Medicare annual wellness visit, subsequent Annual exam as documented. Counseling done  re healthy lifestyle involving commitment to 150 minutes exercise per week, heart healthy diet, and attaining healthy weight.The importance of adequate sleep also discussed. Regular seat belt use and home safety, is also discussed. Changes in health habits are decided on by the patient with goals and time frames  set for achieving them. Immunization  and cancer screening needs are specifically addressed at this visit.   Need for prophylactic vaccination and inoculation against influenza After obtaining informed consent, the vaccine is  administered by LPN.

## 2015-04-09 NOTE — Patient Instructions (Signed)
F/u in 5.5 months, call if you need me before  Change eating and drinking habits please   Flu vaccine today  Labs will be called on later today hopefully   Call dermatology re scalp oil pls as we are unable to obtain   Thanks for choosing [facility] we consider it a privelige to serve you.

## 2015-04-09 NOTE — Assessment & Plan Note (Signed)
After obtaining informed consent, the vaccine is  administered by LPN.  

## 2015-04-09 NOTE — Assessment & Plan Note (Signed)

## 2015-04-19 ENCOUNTER — Encounter: Payer: Self-pay | Admitting: Family Medicine

## 2015-04-24 ENCOUNTER — Telehealth: Payer: Self-pay | Admitting: *Deleted

## 2015-04-24 NOTE — Telephone Encounter (Signed)
Mother aware

## 2015-04-24 NOTE — Telephone Encounter (Signed)
pls asdvised sudafed (OTC) ine daily for 3

## 2015-04-24 NOTE — Telephone Encounter (Signed)
Tried to call listed number back and it is not accepting messages.  Will try again.

## 2015-04-24 NOTE — Telephone Encounter (Signed)
Patient;s mother Janet Acevedo called stating patient is having headaches and pressure behind her ears Please advise

## 2015-04-26 ENCOUNTER — Other Ambulatory Visit: Payer: Self-pay | Admitting: Family Medicine

## 2015-05-13 ENCOUNTER — Telehealth: Payer: Self-pay

## 2015-05-13 MED ORDER — AZITHROMYCIN 250 MG PO TABS: ORAL_TABLET | ORAL | Status: DC

## 2015-05-13 MED ORDER — PROMETHAZINE-DM 6.25-15 MG/5ML PO SYRP: 5.0000 mL | ORAL_SOLUTION | Freq: Every evening | ORAL | Status: DC | PRN

## 2015-05-13 MED ORDER — PREDNISONE 5 MG PO TABS: 5.0000 mg | ORAL_TABLET | Freq: Two times a day (BID) | ORAL | Status: DC

## 2015-05-13 NOTE — Addendum Note (Signed)
Addended by: Eual Fines on: 05/13/2015 01:30 PM   Modules accepted: Orders

## 2015-05-13 NOTE — Telephone Encounter (Signed)
Send z pack and pred 5 mg twice daily # 10 and phenergan dm one tsp at bedtime as needed x 120 cc no refill, call if  No better, for appt

## 2015-05-13 NOTE — Telephone Encounter (Signed)
Patient aware and med sent  

## 2015-05-16 ENCOUNTER — Telehealth: Payer: Self-pay | Admitting: *Deleted

## 2015-05-16 MED ORDER — FLUCONAZOLE 150 MG PO TABS: 150.0000 mg | ORAL_TABLET | Freq: Once | ORAL | Status: DC

## 2015-05-16 NOTE — Telephone Encounter (Signed)
Med sent.

## 2015-05-16 NOTE — Telephone Encounter (Signed)
Patient called stating she is on a antibiotic and she has a yeast infection patient would like a diflucan called in

## 2015-05-20 ENCOUNTER — Telehealth (INDEPENDENT_AMBULATORY_CARE_PROVIDER_SITE_OTHER): Payer: Medicare Other

## 2015-05-20 ENCOUNTER — Ambulatory Visit: Payer: Medicare Other

## 2015-05-20 DIAGNOSIS — N3000 Acute cystitis without hematuria: Secondary | ICD-10-CM | POA: Diagnosis not present

## 2015-05-20 NOTE — Telephone Encounter (Signed)
UA and c/s to be done, UA can be done from office, I will treat symptomatically and f/u / C/s, history highly suggestive of UTI

## 2015-05-20 NOTE — Telephone Encounter (Signed)
Patient will bring in specimen.

## 2015-05-22 LAB — POCT URINALYSIS DIPSTICK
Bilirubin, UA: NEGATIVE
Blood, UA: NEGATIVE
Glucose, UA: NEGATIVE
KETONES UA: 40
LEUKOCYTES UA: NEGATIVE
Nitrite, UA: NEGATIVE
PH UA: 7
PROTEIN UA: NEGATIVE
SPEC GRAV UA: 1.025
UROBILINOGEN UA: 1

## 2015-05-22 MED ORDER — CIPROFLOXACIN HCL 500 MG PO TABS: 500.0000 mg | ORAL_TABLET | Freq: Two times a day (BID) | ORAL | Status: DC

## 2015-05-22 NOTE — Addendum Note (Signed)
Addended by: Denman George B on: 05/22/2015 02:58 PM   Modules accepted: Orders

## 2015-05-22 NOTE — Telephone Encounter (Signed)
Patient c/o right lower back pain, pressure with urination, flank pain, chills but no fever.   Will consult with MD.

## 2015-05-22 NOTE — Telephone Encounter (Signed)
Medication sent.

## 2015-05-22 NOTE — Addendum Note (Signed)
Addended by: Denman George B on: 05/22/2015 04:37 PM   Modules accepted: Orders

## 2015-05-22 NOTE — Telephone Encounter (Signed)
[  pls send urine for c/s  Send cipro for 3 days only Pt to call back if symptoms persist or worsen

## 2015-05-29 ENCOUNTER — Encounter: Payer: Self-pay | Admitting: Family Medicine

## 2015-05-29 ENCOUNTER — Other Ambulatory Visit: Payer: Self-pay | Admitting: Family Medicine

## 2015-05-29 ENCOUNTER — Ambulatory Visit (HOSPITAL_COMMUNITY)
Admission: RE | Admit: 2015-05-29 | Discharge: 2015-05-29 | Disposition: A | Discharge status: Home / Self Care | Payer: Medicare Other | Source: Ambulatory Visit | Attending: Family Medicine | Admitting: Family Medicine

## 2015-05-29 ENCOUNTER — Ambulatory Visit (INDEPENDENT_AMBULATORY_CARE_PROVIDER_SITE_OTHER): Payer: Medicare Other | Admitting: Family Medicine

## 2015-05-29 VITALS — BP 110/60 | HR 100 | Temp 99.2°F | Resp 18

## 2015-05-29 DIAGNOSIS — G129 Spinal muscular atrophy, unspecified: Secondary | ICD-10-CM

## 2015-05-29 DIAGNOSIS — K219 Gastro-esophageal reflux disease without esophagitis: Secondary | ICD-10-CM | POA: Diagnosis not present

## 2015-05-29 DIAGNOSIS — R630 Anorexia: Secondary | ICD-10-CM

## 2015-05-29 DIAGNOSIS — K769 Liver disease, unspecified: Secondary | ICD-10-CM | POA: Diagnosis not present

## 2015-05-29 DIAGNOSIS — R140 Abdominal distension (gaseous): Secondary | ICD-10-CM | POA: Diagnosis not present

## 2015-05-29 DIAGNOSIS — R110 Nausea: Secondary | ICD-10-CM

## 2015-05-29 DIAGNOSIS — E559 Vitamin D deficiency, unspecified: Secondary | ICD-10-CM

## 2015-05-29 DIAGNOSIS — R1011 Right upper quadrant pain: Secondary | ICD-10-CM | POA: Diagnosis not present

## 2015-05-29 DIAGNOSIS — R1010 Upper abdominal pain, unspecified: Secondary | ICD-10-CM | POA: Diagnosis not present

## 2015-05-29 DIAGNOSIS — R7302 Impaired glucose tolerance (oral): Secondary | ICD-10-CM | POA: Diagnosis not present

## 2015-05-29 DIAGNOSIS — K760 Fatty (change of) liver, not elsewhere classified: Secondary | ICD-10-CM | POA: Insufficient documentation

## 2015-05-29 DIAGNOSIS — Z13220 Encounter for screening for lipoid disorders: Secondary | ICD-10-CM | POA: Diagnosis not present

## 2015-05-29 DIAGNOSIS — J302 Other seasonal allergic rhinitis: Secondary | ICD-10-CM

## 2015-05-29 DIAGNOSIS — G8929 Other chronic pain: Secondary | ICD-10-CM

## 2015-05-29 DIAGNOSIS — K7689 Other specified diseases of liver: Secondary | ICD-10-CM

## 2015-05-29 LAB — CBC
HCT: 37.8 % (ref 36.0–46.0)
Hemoglobin: 12.9 g/dL (ref 12.0–15.0)
MCH: 29 pg (ref 26.0–34.0)
MCHC: 34.1 g/dL (ref 30.0–36.0)
MCV: 84.9 fL (ref 78.0–100.0)
MPV: 11.7 fL (ref 8.6–12.4)
PLATELETS: 258 10*3/uL (ref 150–400)
RBC: 4.45 MIL/uL (ref 3.87–5.11)
RDW: 14 % (ref 11.5–15.5)
WBC: 12.6 10*3/uL — AB (ref 4.0–10.5)

## 2015-05-29 NOTE — Progress Notes (Signed)
   Subjective:    Patient ID: Janet Acevedo, female    DOB: Jan 14, 1991, 24 y.o.   MRN: [id number]  HPI 2 week h/o epigastric  RUQ pain with nausea and bloating and poor appetite, has also had intermittent chills ,no documented fever, had reported urinary symptoms , however urine is clear of infection. First episode of this kind.No loose stool,bowel movements are less frequent with poor intake , but no constipation She denies productive cough or excessive nasal drainage or facial pressure C/o fatigue   Review of Systems See HPI  Denies chest pains, palpitations and leg swelling Denies joint pain, has chronic  limitation in mobility due to muscular atrophy Denies headaches, seizures, numbness, or tingling. Denies depression, anxiety or insomnia. Denies skin break down or rash.        Objective:   Physical Exam BP 110/60 mmHg  Pulse 100  Temp(Src) 99.2 F (37.3 C)  Resp 18  SpO2 100%  Pt alert and oriented, not as bright and cheerful as she generally is, as she has had a "bad 2 weeks" and continues to not feel well. She is in no C/P distress.    HEENT: EOMI, no sinus tenderness, TM clear, neck : no cervical adenopathy  Chest: CTA bilaterally  CVS: S1, S2, no S3 no murmur  Abdomen: Soft, mild epigastric and RUQ tenderness, no guarding or rebound Normal BS, no palpable mass or organomegaly  EXT: no edema  Skin: intact, no ulceration or rash noted    Assessment & Plan:  Abdominal pain, chronic, right upper quadrant 2 week h/o nausea , abdominal pain and , bloating with poor apetitie, primarily epigastric and RUQ, [place] and abd xray asap, [facility] referral to GI for furhter eval asap. Complete lab panel and RUQ ultrasound obtained on an  Urgent basis. Labs show elevated WBC marginally with a shift, no abn hepatic or renal function, no pancreatitis. [place] is negative for gallstones, but reports liver lesion 7 cm size, reported as "new"  Muscular atrophy, proximal spinal,  chronic Paraplegic with compromised respiratory reserve , stable and unchanged from this standpoint, however, any surgical procedure should beat [facility] where she has been cared for since infancy,   Seasonal allergies Controlled, no change in medication   Nausea 2 week h/o nausea and poor appetite, unclear etiology, zofran prescribed for symptom control, has upcoming GI appt

## 2015-05-29 NOTE — Patient Instructions (Addendum)
F/u as before  You need labs and and an abdominal X ray today  [place] of gall bladder is ordered asap to evaluate you for gallstones  You will be referred to [facility] if you need any procedure or to see a GI specialist   Hope you feel better soon

## 2015-05-29 NOTE — Assessment & Plan Note (Addendum)
2 week h/o nausea , abdominal pain and , bloating with poor apetitie, primarily epigastric and RUQ, [place] and abd xray asap, [facility] referral to GI for furhter eval asap. Complete lab panel and RUQ ultrasound obtained on an  Urgent basis. Labs show elevated WBC marginally with a shift, no abn hepatic or renal function, no pancreatitis. [place] is negative for gallstones, but reports liver lesion 7 cm size, reported as "new"

## 2015-05-30 DIAGNOSIS — R110 Nausea: Secondary | ICD-10-CM | POA: Insufficient documentation

## 2015-05-30 LAB — DIFFERENTIAL
BASOS ABS: 0 10*3/uL (ref 0.0–0.1)
Basophils Relative: 0 % (ref 0–1)
EOS ABS: 0.3 10*3/uL (ref 0.0–0.7)
Eosinophils Relative: 3 % (ref 0–5)
LYMPHS ABS: 3 10*3/uL (ref 0.7–4.0)
Lymphocytes Relative: 24 % (ref 12–46)
Monocytes Absolute: 0.6 10*3/uL (ref 0.1–1.0)
Monocytes Relative: 5 % (ref 3–12)
NEUTROS ABS: 8.6 10*3/uL — AB (ref 1.7–7.7)
Neutrophils Relative %: 68 % (ref 43–77)

## 2015-05-30 LAB — LIPID PANEL
Cholesterol: 181 mg/dL (ref 125–200)
HDL: 32 mg/dL — ABNORMAL LOW (ref >=46)
LDL Cholesterol: 132 mg/dL — ABNORMAL HIGH (ref <130)
Total CHOL/HDL Ratio: 5.7 Ratio — ABNORMAL HIGH (ref <=5.0)
Triglycerides: 85 mg/dL (ref <150)
VLDL: 17 mg/dL (ref <30)

## 2015-05-30 LAB — COMPREHENSIVE METABOLIC PANEL
ALBUMIN: 4 g/dL (ref 3.6–5.1)
ALT: 25 U/L (ref 6–29)
AST: 16 U/L (ref 10–30)
Alkaline Phosphatase: 79 U/L (ref 33–115)
BILIRUBIN TOTAL: 0.3 mg/dL (ref 0.2–1.2)
BUN: 12 mg/dL (ref 7–25)
CALCIUM: 9.6 mg/dL (ref 8.6–10.2)
CO2: 29 mmol/L (ref 20–31)
Chloride: 101 mmol/L (ref 98–110)
Creat: 0.27 mg/dL — ABNORMAL LOW (ref 0.50–1.10)
Glucose, Bld: 81 mg/dL (ref 65–99)
Potassium: 3.6 mmol/L (ref 3.5–5.3)
Sodium: 141 mmol/L (ref 135–146)
Total Protein: 7 g/dL (ref 6.1–8.1)

## 2015-05-30 LAB — LIPASE: LIPASE: 8 U/L (ref 7–60)

## 2015-05-30 LAB — VITAMIN D 25 HYDROXY (VIT D DEFICIENCY, FRACTURES): Vit D, 25-Hydroxy: 31 ng/mL (ref 30–100)

## 2015-05-30 LAB — HEMOGLOBIN A1C
HEMOGLOBIN A1C: 5.3 % (ref <5.7)
MEAN PLASMA GLUCOSE: 105 mg/dL (ref <117)

## 2015-05-30 LAB — AMYLASE: AMYLASE: 40 U/L (ref 0–105)

## 2015-05-30 LAB — TSH: TSH: 0.697 u[IU]/mL (ref 0.350–4.500)

## 2015-05-30 MED ORDER — ONDANSETRON 4 MG PO TBDP: 4.0000 mg | ORAL_TABLET | Freq: Three times a day (TID) | ORAL | Status: DC | PRN

## 2015-05-31 NOTE — Assessment & Plan Note (Signed)
Paraplegic with compromised respiratory reserve , stable and unchanged from this standpoint, however, any surgical procedure should beat [facility] where she has been cared for since infancy,

## 2015-05-31 NOTE — Assessment & Plan Note (Signed)
2 week h/o nausea and poor appetite, unclear etiology, zofran prescribed for symptom control, has upcoming GI appt

## 2015-05-31 NOTE — Assessment & Plan Note (Signed)
Controlled, no change in medication  

## 2015-06-03 DIAGNOSIS — R932 Abnormal findings on diagnostic imaging of liver and biliary tract: Secondary | ICD-10-CM | POA: Diagnosis not present

## 2015-06-03 DIAGNOSIS — R1013 Epigastric pain: Secondary | ICD-10-CM | POA: Diagnosis not present

## 2015-06-12 ENCOUNTER — Other Ambulatory Visit: Payer: Self-pay | Admitting: Family Medicine

## 2015-06-17 ENCOUNTER — Other Ambulatory Visit: Payer: Self-pay | Admitting: Family Medicine

## 2015-07-18 ENCOUNTER — Other Ambulatory Visit: Payer: Self-pay | Admitting: Family Medicine

## 2015-07-31 ENCOUNTER — Other Ambulatory Visit: Payer: Self-pay | Admitting: Family Medicine

## 2015-08-07 ENCOUNTER — Telehealth: Payer: Self-pay | Admitting: Family Medicine

## 2015-08-07 NOTE — Telephone Encounter (Signed)
Janet Acevedo needs a letter for college [place] verifying her disability, please contact [name] with any questions and also when letter is ready

## 2015-08-08 NOTE — Telephone Encounter (Signed)
Please advise 

## 2015-08-09 NOTE — Telephone Encounter (Signed)
Noted.  Will type letter and patient called and gave fax # and contact for it to be sent to.

## 2015-08-09 NOTE — Telephone Encounter (Signed)
Pls  Type letter documenting wheelchair dependent status due to chronic  Illness from bnirth. Pls ascertain with Mother that this is all that she requires documented, if additional, document what else she want and send me that info as an FYI   I will sign letter once completed  State Name and DOB on letter and the fact thatIi have been her healthcare Provider since the age of 61 ( past 5 years) please  Thanks

## 2015-08-12 NOTE — Telephone Encounter (Signed)
Letter composed and awaiting signature.  

## 2015-09-02 ENCOUNTER — Telehealth: Payer: Self-pay | Admitting: Family Medicine

## 2015-09-02 ENCOUNTER — Ambulatory Visit: Payer: Self-pay | Admitting: Family Medicine

## 2015-09-02 ENCOUNTER — Other Ambulatory Visit: Payer: Self-pay | Admitting: Family Medicine

## 2015-09-02 NOTE — Telephone Encounter (Signed)
Patient is asking for a refill on lansoprazole (PREVACID) 30 MG capsule please

## 2015-09-02 NOTE — Telephone Encounter (Signed)
Patient is asking for a refill on lansoprazole (PREVACID) 30 MG capsule please advise

## 2015-09-03 NOTE — Telephone Encounter (Signed)
Patient is calling back checking on this med refill, please advise?

## 2015-09-23 ENCOUNTER — Other Ambulatory Visit: Payer: Self-pay

## 2015-09-23 MED ORDER — LEVOCETIRIZINE DIHYDROCHLORIDE 5 MG PO TABS: 5.0000 mg | ORAL_TABLET | Freq: Every evening | ORAL | Status: DC

## 2015-09-25 ENCOUNTER — Telehealth: Payer: Self-pay | Admitting: Family Medicine

## 2015-09-25 NOTE — Telephone Encounter (Signed)
Patient is needing letter of necessity for school housing.  Stating that she is in need of a wheelchair accessible 1 room. Letter needs to contain ID which is [id number].  Will be done by Monday.

## 2015-09-25 NOTE — Telephone Encounter (Signed)
Courtney please call [name] regarding Janet Acevedo, she is needing some medical documentation

## 2015-09-26 NOTE — Telephone Encounter (Signed)
Letter composed.

## 2015-09-30 DIAGNOSIS — G129 Spinal muscular atrophy, unspecified: Secondary | ICD-10-CM | POA: Diagnosis not present

## 2015-09-30 DIAGNOSIS — Z01419 Encounter for gynecological examination (general) (routine) without abnormal findings: Secondary | ICD-10-CM | POA: Diagnosis not present

## 2015-09-30 DIAGNOSIS — Z30433 Encounter for removal and reinsertion of intrauterine contraceptive device: Secondary | ICD-10-CM | POA: Diagnosis not present

## 2015-10-04 ENCOUNTER — Telehealth: Payer: Self-pay | Admitting: Family Medicine

## 2015-10-04 MED ORDER — AEROCHAMBER PLUS MISC: Status: AC

## 2015-10-04 NOTE — Telephone Encounter (Signed)
Called into the pharmacy.

## 2015-10-04 NOTE — Telephone Encounter (Signed)
The chamber that she uses for her inhaler is no longer working and she needs a new one, please advise?

## 2015-10-08 ENCOUNTER — Ambulatory Visit: Payer: Medicare Other | Admitting: Family Medicine

## 2015-10-22 ENCOUNTER — Ambulatory Visit (INDEPENDENT_AMBULATORY_CARE_PROVIDER_SITE_OTHER): Payer: Medicare Other | Admitting: Family Medicine

## 2015-10-22 ENCOUNTER — Encounter: Payer: Self-pay | Admitting: Family Medicine

## 2015-10-22 VITALS — BP 118/78 | HR 85 | Resp 16 | Ht 66.0 in

## 2015-10-22 DIAGNOSIS — L219 Seborrheic dermatitis, unspecified: Secondary | ICD-10-CM

## 2015-10-22 DIAGNOSIS — L309 Dermatitis, unspecified: Secondary | ICD-10-CM

## 2015-10-22 DIAGNOSIS — J302 Other seasonal allergic rhinitis: Secondary | ICD-10-CM

## 2015-10-22 DIAGNOSIS — Z79899 Other long term (current) drug therapy: Secondary | ICD-10-CM

## 2015-10-22 DIAGNOSIS — J069 Acute upper respiratory infection, unspecified: Secondary | ICD-10-CM

## 2015-10-22 DIAGNOSIS — J45909 Unspecified asthma, uncomplicated: Secondary | ICD-10-CM

## 2015-10-22 DIAGNOSIS — Z13220 Encounter for screening for lipoid disorders: Secondary | ICD-10-CM | POA: Diagnosis not present

## 2015-10-22 DIAGNOSIS — K219 Gastro-esophageal reflux disease without esophagitis: Secondary | ICD-10-CM

## 2015-10-22 DIAGNOSIS — G129 Spinal muscular atrophy, unspecified: Secondary | ICD-10-CM

## 2015-10-22 DIAGNOSIS — L218 Other seborrheic dermatitis: Secondary | ICD-10-CM

## 2015-10-22 MED ORDER — LEVALBUTEROL HCL 1.25 MG/3ML IN NEBU: 1.2500 mg | INHALATION_SOLUTION | Freq: Four times a day (QID) | RESPIRATORY_TRACT | Status: DC | PRN

## 2015-10-22 MED ORDER — MONTELUKAST SODIUM 10 MG PO TABS: 10.0000 mg | ORAL_TABLET | Freq: Every day | ORAL | Status: DC

## 2015-10-22 MED ORDER — BETAMETHASONE DIPROPIONATE 0.05 % EX CREA: TOPICAL_CREAM | Freq: Two times a day (BID) | CUTANEOUS | Status: DC

## 2015-10-22 NOTE — Progress Notes (Signed)
   Subjective:    Patient ID: Janet Acevedo, female    DOB: June 07, 1991, 25 y.o.   MRN: [id number]  HPI   Janet Acevedo     MRN: [id number]      DOB: 10/02/1990   HPI Janet Acevedo is here for follow up and re-evaluation of chronic medical conditions, medication management and review of any available recent lab and radiology data.  Preventive health is updated, specifically  Cancer screening and Immunization.   Questions or concerns regarding consultations or procedures which the PT has had in the interim are  addressed. The PT denies any adverse reactions to current medications since the last visit.  C/o rash and rough area  On right elbow , where her hand  Rests on the arm of her wheelchair for prolonged periods  ROS Denies recent fever or chills. Denies sinus pressure, nasal congestion, ear pain or sore throat. Denies chest congestion, productive cough or wheezing. Denies chest pains, palpitations and leg swelling Denies abdominal pain, nausea, vomiting,diarrhea or constipation.   Denies dysuria, frequency, hesitancy or incontinence.  Denies headaches, seizures, numbness, or tingling. Denies depression, anxiety or insomnia.    PE  BP 118/78 mmHg  Pulse 85  Resp 16  Ht 5\' 6"  (1.676 m)  SpO2 98%  Patient alert and oriented and in no cardiopulmonary distress.  HEENT: No facial asymmetry, EOMI,   oropharynx pink and moist.  Neck decreased ROM no JVD, no mass.  Chest: Clear to auscultation bilaterally.  CVS: S1, S2 no murmurs, no S3.Regular rate.  ABD: Soft non tender.   Ext: No edema  MS: decreased ROM spine, shoulders, hips and knees.  Skin: Intact, no ulcerations  Noted.Hyperpigmented rash on right elbow  Psych: Good eye contact, normal affect. Memory intact not anxious or depressed appearing.  CNS: CN 2-12 intact, qudriplegic.   Assessment & Plan   Seasonal allergies Controlled, no change in medication   GERD Controlled, no change in  medication   Dermatitis Traumatic rash on right elbow where it rests on her chair, hyperpigmented and rough, topical steroid and recommend padding the chair  Seborrheic dermatitis of scalp Controlled, no change in medication   Asthma occurring only with upper respiratory infection No recent flare, continue current medications  Muscular atrophy, proximal spinal, chronic Quadriplegic and wheelchair dependent, however , excellent brain function, now in college to complete degree, graduated from community college       Review of Systems     Objective:   Physical Exam        Assessment & Plan:

## 2015-10-22 NOTE — Patient Instructions (Addendum)
Annual wellness early October, call if you need me sooner  CONGRATS on graaduating from [place], and moving on to [employer], [place]!  Labs today, lipid and CBC  Please change eating as we discussed , it will be great for your health  Cream sent for rash on right elbow

## 2015-10-23 ENCOUNTER — Encounter: Payer: Self-pay | Admitting: Family Medicine

## 2015-10-23 LAB — CBC
HEMATOCRIT: 40.1 % (ref 36.0–46.0)
Hemoglobin: 13.1 g/dL (ref 12.0–15.0)
MCH: 28.2 pg (ref 26.0–34.0)
MCHC: 32.7 g/dL (ref 30.0–36.0)
MCV: 86.4 fL (ref 78.0–100.0)
MPV: 11.1 fL (ref 8.6–12.4)
PLATELETS: 251 10*3/uL (ref 150–400)
RBC: 4.64 MIL/uL (ref 3.87–5.11)
RDW: 13.4 % (ref 11.5–15.5)
WBC: 11.8 10*3/uL — ABNORMAL HIGH (ref 4.0–10.5)

## 2015-10-23 LAB — LIPID PANEL
CHOL/HDL RATIO: 5.3 ratio — AB (ref <=5.0)
CHOLESTEROL: 184 mg/dL (ref 125–200)
HDL: 35 mg/dL — AB (ref >=46)
LDL Cholesterol: 136 mg/dL — ABNORMAL HIGH (ref <130)
Triglycerides: 65 mg/dL (ref <150)
VLDL: 13 mg/dL (ref <30)

## 2015-10-24 ENCOUNTER — Other Ambulatory Visit: Payer: Self-pay

## 2015-10-24 MED ORDER — LEVALBUTEROL HCL 1.25 MG/3ML IN NEBU: 1.2500 mg | INHALATION_SOLUTION | Freq: Four times a day (QID) | RESPIRATORY_TRACT | Status: DC | PRN

## 2015-10-26 NOTE — Assessment & Plan Note (Signed)
Traumatic rash on right elbow where it rests on her chair, hyperpigmented and rough, topical steroid and recommend padding the chair

## 2015-10-26 NOTE — Assessment & Plan Note (Signed)
Controlled, no change in medication  

## 2015-10-26 NOTE — Assessment & Plan Note (Signed)
Quadriplegic and wheelchair dependent, however , excellent brain function, now in college to complete degree, graduated from [employer]

## 2015-10-26 NOTE — Assessment & Plan Note (Signed)
No recent flare, continue current medications

## 2015-10-31 DIAGNOSIS — R42 Dizziness and giddiness: Secondary | ICD-10-CM | POA: Diagnosis not present

## 2015-10-31 DIAGNOSIS — R030 Elevated blood-pressure reading, without diagnosis of hypertension: Secondary | ICD-10-CM | POA: Diagnosis not present

## 2015-11-01 ENCOUNTER — Other Ambulatory Visit: Payer: Self-pay | Admitting: Family Medicine

## 2015-11-04 ENCOUNTER — Telehealth: Payer: Self-pay | Admitting: Family Medicine

## 2015-11-04 MED ORDER — OMEPRAZOLE 20 MG PO CPDR: 20.0000 mg | DELAYED_RELEASE_CAPSULE | Freq: Every day | ORAL | Status: DC

## 2015-11-04 NOTE — Telephone Encounter (Signed)
Patient is stating that her insurance no longer pays for her lansoprazole (PREVACID) 30 MG capsule she needs something different and she is completely out of medication

## 2015-11-04 NOTE — Telephone Encounter (Signed)
Medication changed to omeprazole per formulary request

## 2015-11-07 ENCOUNTER — Other Ambulatory Visit: Payer: Self-pay | Admitting: Family Medicine

## 2015-11-28 ENCOUNTER — Other Ambulatory Visit: Payer: Self-pay | Admitting: Family Medicine

## 2015-12-09 ENCOUNTER — Other Ambulatory Visit: Payer: Self-pay | Admitting: Family Medicine

## 2015-12-09 DIAGNOSIS — J309 Allergic rhinitis, unspecified: Secondary | ICD-10-CM | POA: Diagnosis not present

## 2015-12-09 DIAGNOSIS — J4540 Moderate persistent asthma, uncomplicated: Secondary | ICD-10-CM | POA: Diagnosis not present

## 2015-12-09 DIAGNOSIS — G121 Other inherited spinal muscular atrophy: Secondary | ICD-10-CM | POA: Diagnosis not present

## 2015-12-09 DIAGNOSIS — J45909 Unspecified asthma, uncomplicated: Secondary | ICD-10-CM | POA: Diagnosis not present

## 2015-12-09 DIAGNOSIS — J984 Other disorders of lung: Secondary | ICD-10-CM | POA: Diagnosis not present

## 2015-12-27 ENCOUNTER — Other Ambulatory Visit: Payer: Self-pay | Admitting: Family Medicine

## 2015-12-31 ENCOUNTER — Other Ambulatory Visit: Payer: Self-pay | Admitting: Family Medicine

## 2015-12-31 ENCOUNTER — Telehealth: Payer: Self-pay | Admitting: Family Medicine

## 2015-12-31 NOTE — Telephone Encounter (Signed)
[name] skin is irritated and [name] is asking for something other than the powder to help, please advise?

## 2015-12-31 NOTE — Telephone Encounter (Signed)
Patient will try using betamethasone cream.  She will call back if she continues to have any problems.

## 2016-01-10 ENCOUNTER — Other Ambulatory Visit: Payer: Self-pay | Admitting: Family Medicine

## 2016-01-14 ENCOUNTER — Other Ambulatory Visit: Payer: Self-pay

## 2016-01-14 MED ORDER — CROMOLYN SODIUM 4 % OP SOLN: OPHTHALMIC | Status: DC

## 2016-01-24 DIAGNOSIS — N882 Stricture and stenosis of cervix uteri: Secondary | ICD-10-CM | POA: Diagnosis not present

## 2016-01-24 DIAGNOSIS — Z30433 Encounter for removal and reinsertion of intrauterine contraceptive device: Secondary | ICD-10-CM | POA: Diagnosis not present

## 2016-01-24 DIAGNOSIS — N921 Excessive and frequent menstruation with irregular cycle: Secondary | ICD-10-CM | POA: Diagnosis not present

## 2016-01-31 ENCOUNTER — Other Ambulatory Visit: Payer: Self-pay | Admitting: Family Medicine

## 2016-02-19 ENCOUNTER — Telehealth: Payer: Self-pay | Admitting: Family Medicine

## 2016-02-19 NOTE — Telephone Encounter (Signed)
Called pt about form - she asked me to fax it to [employer] and mail herself one

## 2016-02-21 DIAGNOSIS — K219 Gastro-esophageal reflux disease without esophagitis: Secondary | ICD-10-CM | POA: Diagnosis not present

## 2016-02-21 DIAGNOSIS — Z993 Dependence on wheelchair: Secondary | ICD-10-CM | POA: Diagnosis not present

## 2016-02-21 DIAGNOSIS — Z8249 Family history of ischemic heart disease and other diseases of the circulatory system: Secondary | ICD-10-CM | POA: Diagnosis not present

## 2016-02-21 DIAGNOSIS — Z79899 Other long term (current) drug therapy: Secondary | ICD-10-CM | POA: Diagnosis not present

## 2016-02-21 DIAGNOSIS — Z30433 Encounter for removal and reinsertion of intrauterine contraceptive device: Secondary | ICD-10-CM | POA: Diagnosis not present

## 2016-02-21 DIAGNOSIS — G129 Spinal muscular atrophy, unspecified: Secondary | ICD-10-CM | POA: Diagnosis not present

## 2016-02-21 DIAGNOSIS — Z7951 Long term (current) use of inhaled steroids: Secondary | ICD-10-CM | POA: Diagnosis not present

## 2016-02-21 DIAGNOSIS — T8339XD Other mechanical complication of intrauterine contraceptive device, subsequent encounter: Secondary | ICD-10-CM | POA: Diagnosis not present

## 2016-02-21 DIAGNOSIS — J45909 Unspecified asthma, uncomplicated: Secondary | ICD-10-CM | POA: Diagnosis not present

## 2016-02-24 ENCOUNTER — Other Ambulatory Visit: Payer: Self-pay | Admitting: Family Medicine

## 2016-02-24 DIAGNOSIS — J302 Other seasonal allergic rhinitis: Secondary | ICD-10-CM

## 2016-03-16 ENCOUNTER — Other Ambulatory Visit: Payer: Self-pay | Admitting: Family Medicine

## 2016-03-30 ENCOUNTER — Other Ambulatory Visit: Payer: Self-pay | Admitting: Family Medicine

## 2016-04-27 ENCOUNTER — Other Ambulatory Visit: Payer: Self-pay | Admitting: Family Medicine

## 2016-04-29 ENCOUNTER — Ambulatory Visit (INDEPENDENT_AMBULATORY_CARE_PROVIDER_SITE_OTHER): Payer: Medicare Other

## 2016-04-29 ENCOUNTER — Telehealth: Payer: Self-pay

## 2016-04-29 VITALS — BP 102/70 | HR 92 | Resp 18

## 2016-04-29 DIAGNOSIS — Z0000 Encounter for general adult medical examination without abnormal findings: Secondary | ICD-10-CM

## 2016-04-29 DIAGNOSIS — J302 Other seasonal allergic rhinitis: Secondary | ICD-10-CM | POA: Diagnosis not present

## 2016-04-29 MED ORDER — METHYLPREDNISOLONE ACETATE 80 MG/ML IJ SUSP
80.0000 mg | Freq: Once | INTRAMUSCULAR | Status: AC
Start: 2016-04-29 — End: 2016-04-29
  Administered 2016-04-29: 80 mg via INTRAMUSCULAR

## 2016-04-29 NOTE — Patient Instructions (Addendum)
Thank you for choosing [facility] for your health care needs  The Annual Wellness Visit is designed to allow [place] the chance to assist you in preserving and improving you health.   Dr. Moshe Cipro will see you back in 4 months for your next office visit  Any labs needed will be mailed with the date to have them done  If you have any concerns please don't hesitate to call.  The new # is [phone]  Good luck with classes!

## 2016-04-29 NOTE — Telephone Encounter (Signed)
Authorize injection 80 mg of depomedrol.  Flu shot next week.  Also advise Prevnar.

## 2016-04-29 NOTE — Progress Notes (Signed)
Subjective:    Janet Acevedo is a 25 y.o. female who presents for Medicare Annual/Subsequent preventive examination.  Preventive Screening-Counseling & Management  Tobacco History  Smoking Status  . Never Smoker  Smokeless Tobacco  . Never Used     Current Problems (verified) Patient Active Problem List   Diagnosis Date Noted  . Dermatitis 10/22/2015  . Nausea 05/30/2015  . Asthma occurring only with upper respiratory infection 02/10/2015  . Seborrheic dermatitis of scalp 02/07/2015  . Urinary incontinence due to severe physical disability 02/12/2014  . Morbid obesity ([place]) 04/03/2011  . DERMATOMYCOSIS 06/29/2010  . GERD 06/29/2010  . Muscular atrophy, proximal spinal, chronic ([place]) 02/24/2010  . Seasonal allergies 02/24/2010    Medications Prior to Visit Current Outpatient Prescriptions on File Prior to Visit  Medication Sig Dispense Refill  . azelastine (ASTELIN) 0.1 % nasal spray PLACE 2 SPRAYS INTO BOTH NOSTRILS 2 (TWO) TIMES DAILY. USE IN EACH NOSTRIL AS DIRECTED 30 mL 6  . betamethasone dipropionate (DIPROLENE) 0.05 % cream APPLY TOPICALLY 2 (TWO) TIMES DAILY. 45 g 1  . Biotin (BIOTIN MAXIMUM STRENGTH) 10 MG TABS Take 1 tablet by mouth daily.    . Calcium Citrate (CITRACAL PO) Take 1 capsule by mouth daily.     . cromolyn (OPTICROM) 4 % ophthalmic solution PLACE 1 DROP INTO BOTH EYES FOUR TIMES DAILY 10 mL 3  . levalbuterol (XOPENEX) 1.25 MG/3ML nebulizer solution Take 1.25 mg by nebulization every 6 (six) hours as needed for wheezing. 72 mL 3  . levocetirizine (XYZAL) 5 MG tablet TAKE 1 TABLET (5 MG TOTAL) BY MOUTH EVERY EVENING. 90 tablet 0  . montelukast (SINGULAIR) 10 MG tablet TAKE 1 TABLET BY MOUTH ONCE A DAY 90 tablet 0  . Multiple Vitamin (MULTIVITAMIN) tablet Take 1 tablet by mouth daily.      . NYSTATIN powder APPLY TWICE DAILY TO AFFECTED AREA AS NEEDED 60 g 1  . omeprazole (PRILOSEC) 20 MG capsule TAKE 1 CAPSULE EVERY DAY 30 capsule 3  .  Spacer/Aero-Holding Chambers (AEROCHAMBER PLUS) inhaler Use as instructed 1 each 2  . SYMBICORT 160-4.5 MCG/ACT inhaler INHALE 2 PUFFS INTO THE LUNGS 2 TIMES DAILY. 10.2 Inhaler 3  . VENTOLIN HFA 108 (90 Base) MCG/ACT inhaler INHALE 2 PUFFS INTO THE LUNGS EVERY 4 (FOUR) HOURS AS NEEDED FOR WHEEZING OR SHORTNESS OF BREATH. 18 Inhaler 3   No current facility-administered medications on file prior to visit.     Current Medications (verified) Current Outpatient Prescriptions  Medication Sig Dispense Refill  . azelastine (ASTELIN) 0.1 % nasal spray PLACE 2 SPRAYS INTO BOTH NOSTRILS 2 (TWO) TIMES DAILY. USE IN EACH NOSTRIL AS DIRECTED 30 mL 6  . betamethasone dipropionate (DIPROLENE) 0.05 % cream APPLY TOPICALLY 2 (TWO) TIMES DAILY. 45 g 1  . Biotin (BIOTIN MAXIMUM STRENGTH) 10 MG TABS Take 1 tablet by mouth daily.    . Calcium Citrate (CITRACAL PO) Take 1 capsule by mouth daily.     . cromolyn (OPTICROM) 4 % ophthalmic solution PLACE 1 DROP INTO BOTH EYES FOUR TIMES DAILY 10 mL 3  . levalbuterol (XOPENEX) 1.25 MG/3ML nebulizer solution Take 1.25 mg by nebulization every 6 (six) hours as needed for wheezing. 72 mL 3  . levocetirizine (XYZAL) 5 MG tablet TAKE 1 TABLET (5 MG TOTAL) BY MOUTH EVERY EVENING. 90 tablet 0  . montelukast (SINGULAIR) 10 MG tablet TAKE 1 TABLET BY MOUTH ONCE A DAY 90 tablet 0  . Multiple Vitamin (MULTIVITAMIN) tablet Take 1 tablet by  mouth daily.      . NYSTATIN powder APPLY TWICE DAILY TO AFFECTED AREA AS NEEDED 60 g 1  . omeprazole (PRILOSEC) 20 MG capsule TAKE 1 CAPSULE EVERY DAY 30 capsule 3  . Spacer/Aero-Holding Chambers (AEROCHAMBER PLUS) inhaler Use as instructed 1 each 2  . SYMBICORT 160-4.5 MCG/ACT inhaler INHALE 2 PUFFS INTO THE LUNGS 2 TIMES DAILY. 10.2 Inhaler 3  . VENTOLIN HFA 108 (90 Base) MCG/ACT inhaler INHALE 2 PUFFS INTO THE LUNGS EVERY 4 (FOUR) HOURS AS NEEDED FOR WHEEZING OR SHORTNESS OF BREATH. 18 Inhaler 3   No current facility-administered  medications for this visit.      Allergies (verified) Ciprofloxacin   PAST HISTORY  Family History Family History  Problem Relation Age of Onset  . Hyperlipidemia Mother   . Hypertension Mother   . Hypertension Brother   . Diabetes Brother     borderline     Social History Social History  Substance Use Topics  . Smoking status: Never Smoker  . Smokeless tobacco: Never Used  . Alcohol use No     Are there smokers in your home (other than you)? No  Risk Factors Current exercise habits: Exercise limited by muscular atrophy    Dietary issues discussed: Discussed variety in diet and portion sizes   Cardiac risk factors: obesity (BMI >= 30 kg/m2) and sedentary lifestyle.  Depression Screen (Note: if answer to either of the following is "Yes", a more complete depression screening is indicated)   Over the past two weeks, have you felt down, depressed or hopeless? No  Over the past two weeks, have you felt little interest or pleasure in doing things? No  Have you lost interest or pleasure in daily life? No  Do you often feel hopeless? No  Do you cry easily over simple problems? 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   In your present state of health, do you have any difficulty performing the following activities?:  Driving? Yes Managing money?  No Feeding yourself? No Getting from bed to chair? YesNo exam performed today, annual wellness without physical exam. Climbing a flight of stairs? Yes Preparing food and eating?: No Bathing or showering? Yes Getting dressed: Yes Getting to the toilet? Yes Using the toilet:No Moving around from place to place: Yes In the past year have you fallen or had a near fall?:No   Are you sexually active?  No  Do you have more than one partner?  N/a   Hearing Difficulties: No Do you often ask people to speak up or repeat themselves? No Do you experience  ringing or noises in your ears? No Do you have difficulty understanding soft or whispered voices? No   Do you feel that you have a problem with memory? No  Do you often misplace items? No  Do you feel safe at home?  Yes  Cognitive Testing  Alert? Yes  Normal Appearance?Yes  Oriented to person? Yes  Place? Yes   Time? Yes  Recall of three objects?  Yes  Can perform simple calculations? Yes  Displays appropriate judgment?Yes  Can read the correct time from a watch face?Yes   Advanced Directives have been discussed with the patient? No  List the Names of Other Physician/Practitioners you currently use: 1.  Dr. Aretha Parrot (pulmonolgy-[facility]) 2.  Dr. Adah Perl (gynecology)   Indicate any recent Medical Services you may have received from other than [facility] providers in the past year (  date may be approximate).  Immunization History  Administered Date(s) Administered  . Influenza Split 04/07/2012  . Influenza Whole 04/07/2010, 04/03/2011  . Influenza,inj,Quad PF,36+ Mos 05/10/2013, 07/12/2014, 04/09/2015    Screening Tests Health Maintenance  Topic Date Due  . INFLUENZA VACCINE  06/25/2016 (Originally 02/25/2016)  . HIV Screening  03/11/2017 (Originally 12/20/2006)  . TETANUS/TDAP  03/18/2017 (Originally 12/20/2010)  . PAP SMEAR  05/13/2017 (Originally 12/19/2012)    All answers were reviewed with the patient and necessary referrals were made:  Vanetta Mulders, LPN   [id number]   History reviewed: allergies, current medications, past family history, past medical history, past social history, past surgical history and problem list  Review of Systems A comprehensive review of systems was negative.    Objective:     Vision by Snellen chart: right eye:20/20, left eye:20/20  There is no height or weight on file to calculate BMI. BP 102/70   Pulse 92   Resp 18   SpO2 98%   No exam performed today, annual wellness without physical exam.     Assessment:      Plan:      During the course of the visit the patient was educated and counseled about appropriate screening and preventive services including:    Influenza vaccine  Nutrition counseling   Diet review for nutrition referral? Yes ____  Not Indicated ___x_   Patient Instructions (the written plan) was given to the patient.  Medicare Attestation I have personally reviewed: The patient's medical and social history Their use of alcohol, tobacco or illicit drugs Their current medications and supplements The patient's functional ability including ADLs,fall risks, home safety risks, cognitive, and hearing and visual impairment Diet and physical activities Evidence for depression or mood disorders  The patient's weight, height, BMI, and visual acuity have been recorded in the chart.  I have made referrals, counseling, and provided education to the patient based on review of the above and I have provided the patient with a written personalized care plan for preventive services.     Denman George [place], [place]   [id number]

## 2016-04-29 NOTE — Telephone Encounter (Signed)
Noted  

## 2016-04-29 NOTE — Progress Notes (Signed)
Patient c/o sinus problems. Symptoms include fatigue, sinus drainage, cough and congestion x 48 hours. Order received for Depo Medrol 80 mg IM.  Injection given as ordered.  No voiced complaints.  Patient will return on next week to receive flu shot.

## 2016-05-01 ENCOUNTER — Telehealth: Payer: Self-pay | Admitting: Family Medicine

## 2016-05-01 MED ORDER — AZITHROMYCIN 250 MG PO TABS: ORAL_TABLET | ORAL | 0 refills | Status: DC

## 2016-05-01 NOTE — Telephone Encounter (Signed)
Patient aware and medication sent to pharmacy  

## 2016-05-01 NOTE — Telephone Encounter (Signed)
Chart is reviewed. Make sure she is using her inhalers as directed. Push fluids. Call in a Z-Pak, take as directed. Sometimes she gets prednisone with her upper respiratory infection, but this is not indicated today since she just go a Depo-Medrol shot. Call if not improved by Monday. Go to ER if significantly worse over weekend

## 2016-05-01 NOTE — Telephone Encounter (Signed)
C/o of increased fatigue.  Productive cough.  Post nasal drip.  Intermittent chills.  Please advise.

## 2016-05-01 NOTE — Telephone Encounter (Signed)
Janet Acevedo is now coughing and wheezing and Olin Hauser is asking if she could get something else called in, please advise?

## 2016-05-04 ENCOUNTER — Telehealth: Payer: Self-pay

## 2016-05-04 NOTE — Telephone Encounter (Signed)
If only has uncontrolled allergies , no need for z pack so d/c samre , I see she got depomedrol, if no fever , chills , gresn drainage etc stop anitibiotic, z pack completed anyhow as prescribed on 10/04.  Recommend immodium if needed short term for loose stool, appt if needed

## 2016-05-04 NOTE — Telephone Encounter (Signed)
Mother aware

## 2016-05-05 ENCOUNTER — Ambulatory Visit (INDEPENDENT_AMBULATORY_CARE_PROVIDER_SITE_OTHER): Payer: Medicare Other

## 2016-05-05 DIAGNOSIS — Z23 Encounter for immunization: Secondary | ICD-10-CM

## 2016-05-25 ENCOUNTER — Other Ambulatory Visit: Payer: Self-pay | Admitting: Family Medicine

## 2016-06-06 ENCOUNTER — Other Ambulatory Visit: Payer: Self-pay | Admitting: Family Medicine

## 2016-06-10 ENCOUNTER — Telehealth: Payer: Self-pay

## 2016-06-10 MED ORDER — PREDNISONE 5 MG PO TABS: 5.0000 mg | ORAL_TABLET | Freq: Two times a day (BID) | ORAL | 0 refills | Status: DC

## 2016-06-10 NOTE — Telephone Encounter (Signed)
Medication sent to pharmacy and patient aware.  

## 2016-06-10 NOTE — Telephone Encounter (Signed)
pls send pred 5 mg one twice daily for 5 days only and let her know

## 2016-06-12 ENCOUNTER — Encounter: Payer: Self-pay | Admitting: Family Medicine

## 2016-06-12 ENCOUNTER — Telehealth: Payer: Self-pay

## 2016-06-12 NOTE — Telephone Encounter (Signed)
School excuse composed

## 2016-06-23 ENCOUNTER — Other Ambulatory Visit: Payer: Self-pay | Admitting: Family Medicine

## 2016-07-16 ENCOUNTER — Other Ambulatory Visit: Payer: Self-pay | Admitting: Family Medicine

## 2016-07-31 ENCOUNTER — Other Ambulatory Visit: Payer: Self-pay | Admitting: Family Medicine

## 2016-08-14 ENCOUNTER — Other Ambulatory Visit: Payer: Self-pay | Admitting: Family Medicine

## 2016-08-14 ENCOUNTER — Other Ambulatory Visit: Payer: Self-pay

## 2016-08-14 ENCOUNTER — Telehealth: Payer: Self-pay

## 2016-08-14 MED ORDER — PREDNISONE 5 MG (21) PO TBPK: 5.0000 mg | ORAL_TABLET | ORAL | 0 refills | Status: DC

## 2016-08-14 MED ORDER — BENZONATATE 100 MG PO CAPS: 100.0000 mg | ORAL_CAPSULE | Freq: Two times a day (BID) | ORAL | 0 refills | Status: DC | PRN

## 2016-08-14 MED ORDER — PENICILLIN V POTASSIUM 500 MG PO TABS: 500.0000 mg | ORAL_TABLET | Freq: Three times a day (TID) | ORAL | 0 refills | Status: DC

## 2016-08-14 MED ORDER — FLUCONAZOLE 150 MG PO TABS: 150.0000 mg | ORAL_TABLET | Freq: Once | ORAL | 0 refills | Status: AC

## 2016-08-14 NOTE — Progress Notes (Unsigned)
Medications sent and mother aware.

## 2016-08-14 NOTE — Telephone Encounter (Signed)
Medications sent to pharmacy and mother aware.

## 2016-08-14 NOTE — Telephone Encounter (Signed)
Tessalon perle, prednisone dose pack and penicillin are sent in, pls let her know Pls send fluconazole 150 mg #2 if needed

## 2016-08-15 ENCOUNTER — Other Ambulatory Visit: Payer: Self-pay | Admitting: Family Medicine

## 2016-08-18 ENCOUNTER — Other Ambulatory Visit: Payer: Self-pay

## 2016-08-18 MED ORDER — LEVALBUTEROL HCL 1.25 MG/3ML IN NEBU: 1.2500 mg | INHALATION_SOLUTION | Freq: Four times a day (QID) | RESPIRATORY_TRACT | 3 refills | Status: DC | PRN

## 2016-08-22 ENCOUNTER — Other Ambulatory Visit: Payer: Self-pay | Admitting: Family Medicine

## 2016-08-24 ENCOUNTER — Other Ambulatory Visit: Payer: Self-pay | Admitting: Family Medicine

## 2016-09-14 ENCOUNTER — Telehealth: Payer: Self-pay

## 2016-09-14 ENCOUNTER — Other Ambulatory Visit: Payer: Self-pay

## 2016-09-14 MED ORDER — PREDNISONE 20 MG PO TABS: 20.0000 mg | ORAL_TABLET | Freq: Two times a day (BID) | ORAL | 0 refills | Status: DC

## 2016-09-14 NOTE — Telephone Encounter (Signed)
States since having the flu she has been wheezing. She is wheelchair bound due to spinal muscle atrophy and has asthma and is requesting prednisone, Please advise

## 2016-09-14 NOTE — Telephone Encounter (Signed)
Give her prednisone 20 mg twice a day for 5 days

## 2016-09-14 NOTE — Telephone Encounter (Signed)
Med sent and mother aware

## 2016-09-17 ENCOUNTER — Ambulatory Visit: Payer: Medicare Other | Admitting: Family Medicine

## 2016-09-18 ENCOUNTER — Encounter: Payer: Self-pay | Admitting: Family Medicine

## 2016-09-18 ENCOUNTER — Telehealth: Payer: Self-pay

## 2016-09-18 NOTE — Telephone Encounter (Signed)
Aware that patient missed last appointment.  Will seek care at urgent care over the weekend if needed.

## 2016-09-19 ENCOUNTER — Ambulatory Visit (HOSPITAL_COMMUNITY)
Admission: RE | Admit: 2016-09-19 | Discharge: 2016-09-19 | Disposition: A | Discharge status: Home / Self Care | Payer: Medicare Other | Source: Ambulatory Visit | Attending: Family Medicine | Admitting: Family Medicine

## 2016-09-19 ENCOUNTER — Other Ambulatory Visit: Payer: Self-pay | Admitting: Family Medicine

## 2016-09-19 DIAGNOSIS — J45909 Unspecified asthma, uncomplicated: Secondary | ICD-10-CM | POA: Diagnosis present

## 2016-09-19 DIAGNOSIS — A4189 Other specified sepsis: Secondary | ICD-10-CM | POA: Diagnosis not present

## 2016-09-19 DIAGNOSIS — R05 Cough: Secondary | ICD-10-CM | POA: Insufficient documentation

## 2016-09-19 DIAGNOSIS — K219 Gastro-esophageal reflux disease without esophagitis: Secondary | ICD-10-CM | POA: Diagnosis present

## 2016-09-19 DIAGNOSIS — J189 Pneumonia, unspecified organism: Secondary | ICD-10-CM | POA: Diagnosis not present

## 2016-09-19 DIAGNOSIS — G121 Other inherited spinal muscular atrophy: Secondary | ICD-10-CM | POA: Diagnosis not present

## 2016-09-19 DIAGNOSIS — J159 Unspecified bacterial pneumonia: Secondary | ICD-10-CM | POA: Diagnosis present

## 2016-09-19 DIAGNOSIS — E876 Hypokalemia: Secondary | ICD-10-CM | POA: Diagnosis present

## 2016-09-19 DIAGNOSIS — M419 Scoliosis, unspecified: Secondary | ICD-10-CM | POA: Diagnosis present

## 2016-09-19 DIAGNOSIS — R058 Other specified cough: Secondary | ICD-10-CM

## 2016-09-19 DIAGNOSIS — J984 Other disorders of lung: Secondary | ICD-10-CM | POA: Diagnosis present

## 2016-09-19 DIAGNOSIS — A419 Sepsis, unspecified organism: Secondary | ICD-10-CM | POA: Diagnosis present

## 2016-09-19 DIAGNOSIS — R918 Other nonspecific abnormal finding of lung field: Secondary | ICD-10-CM | POA: Diagnosis not present

## 2016-09-19 DIAGNOSIS — R51 Headache: Secondary | ICD-10-CM | POA: Diagnosis not present

## 2016-09-19 DIAGNOSIS — J069 Acute upper respiratory infection, unspecified: Secondary | ICD-10-CM | POA: Diagnosis present

## 2016-09-19 DIAGNOSIS — Z981 Arthrodesis status: Secondary | ICD-10-CM | POA: Diagnosis not present

## 2016-09-19 DIAGNOSIS — Z993 Dependence on wheelchair: Secondary | ICD-10-CM | POA: Diagnosis not present

## 2016-09-19 DIAGNOSIS — Z7951 Long term (current) use of inhaled steroids: Secondary | ICD-10-CM | POA: Diagnosis not present

## 2016-09-19 DIAGNOSIS — R000 Tachycardia, unspecified: Secondary | ICD-10-CM | POA: Diagnosis not present

## 2016-09-19 DIAGNOSIS — G129 Spinal muscular atrophy, unspecified: Secondary | ICD-10-CM | POA: Diagnosis not present

## 2016-09-19 MED ORDER — AZITHROMYCIN 250 MG PO TABS: ORAL_TABLET | ORAL | 0 refills | Status: DC

## 2016-09-24 ENCOUNTER — Telehealth: Payer: Self-pay

## 2016-09-24 NOTE — Telephone Encounter (Signed)
Transition Care Management Follow-up Telephone Call   Date discharged? 09/23/2016   How have you been since you were released from the hospital? Feeling  Much better. Patient states she has developed vaginal itching and burning and is requesting a rx for diflucan. She denies any abnormal vaginal discharge at this time.   Do you understand why you were in the hospital? yes   Do you understand the discharge instructions? yes  Where were you discharged to? Home  Items Reviewed:  Medications reviewed: yes  Allergies reviewed: yes  Dietary changes reviewed: yes  Referrals reviewed: yes   Any transportation issues/concerns?: no   Any patient concerns? Yes, concerned about possible vaginal yeast infection   Confirmed importance and date/time of follow-up visits scheduled yes  Provider Appointment booked with Dr. Moshe Cipro on 09/28/2016 at 10:00 am  Confirmed with patient if condition begins to worsen call PCP or go to the ER.  Patient was given the office number and encouraged to call back with question or concerns.  : yes

## 2016-09-25 ENCOUNTER — Encounter: Payer: Self-pay | Admitting: Family Medicine

## 2016-09-25 ENCOUNTER — Other Ambulatory Visit: Payer: Self-pay | Admitting: Family Medicine

## 2016-09-25 MED ORDER — FLUCONAZOLE 150 MG PO TABS: ORAL_TABLET | ORAL | 0 refills | Status: DC

## 2016-09-25 NOTE — Telephone Encounter (Signed)
Message left

## 2016-09-25 NOTE — Telephone Encounter (Signed)
pls let pt know that fluconazole has been sent in for vaginal itch, thanks

## 2016-09-26 ENCOUNTER — Other Ambulatory Visit: Payer: Self-pay | Admitting: Family Medicine

## 2016-09-26 DIAGNOSIS — J302 Other seasonal allergic rhinitis: Secondary | ICD-10-CM

## 2016-09-28 ENCOUNTER — Ambulatory Visit (INDEPENDENT_AMBULATORY_CARE_PROVIDER_SITE_OTHER): Payer: Medicare Other | Admitting: Family Medicine

## 2016-09-28 ENCOUNTER — Encounter: Payer: Self-pay | Admitting: Family Medicine

## 2016-09-28 VITALS — BP 114/74 | HR 98 | Resp 16 | Ht 67.0 in | Wt 169.0 lb

## 2016-09-28 DIAGNOSIS — Z09 Encounter for follow-up examination after completed treatment for conditions other than malignant neoplasm: Secondary | ICD-10-CM | POA: Diagnosis not present

## 2016-09-28 DIAGNOSIS — E785 Hyperlipidemia, unspecified: Secondary | ICD-10-CM | POA: Diagnosis not present

## 2016-09-28 NOTE — Assessment & Plan Note (Signed)
Patient improved significantly following hospitalization for pneumonia. She has completed her antibiotic course , is asymptomatic and exam at visit is normal She has a f/u with pulmonary clinic in [place] next week, which she will keep. No imaging studies or rept labs are ordered today

## 2016-09-28 NOTE — Patient Instructions (Addendum)
F/u in 4  Months, call if you need me sooner  Thankful you are 100% better  School excuse from Feb 15 to return on October 05, 2016  Please always listen to your body and be very quick to get help esp with your lungs  Eat mainly fruit and vegetable beans and nuts, less processed and fried foods and sweets this is HEALTHY  Fasting lipid, cbc 1 week before follow up  Thank you  for choosing [facility]. We consider it a privelige to serve you.  Delivering excellent health care in a caring and  compassionate way is our goal.  Partnering with you,  so that together we can achieve this goal is our strategy.

## 2016-09-28 NOTE — Assessment & Plan Note (Signed)
Hyperlipidemia:Low fat diet discussed and encouraged.   Lipid Panel  Lab Results  Component Value Date   CHOL 184 10/22/2015   HDL 35 (L) 10/22/2015   LDLCALC 136 (H) 10/22/2015   TRIG 65 10/22/2015   CHOLHDL 5.3 (H) 10/22/2015   Updated lab needed at/ before next visit.

## 2016-09-28 NOTE — Progress Notes (Signed)
   Janet Acevedo     MRN: [id number]      DOB: 09/06/1990   HPI [name]  Patient in for follow up of recent hospitalization.for pneumonia, was initially treated for influenza  Then she developed pneumonia. Home for past 4 days, denies p[roductive cough, fever or chills , reports good appetite and increasing strength. Home on spring break this week, needs school excuse from 2/15 to return next Monday, currently has make up work that she is doing at home Discharge summary, and laboratory and radiology data are reviewed, and any questions or concerns about recent hospitalization are discussed. Specific issues requiring follow up are specifically addressed.    ROS See HPI  . Denies chest pains, palpitations and leg swelling Denies abdominal pain, nausea, vomiting,diarrhea or constipation.   Denies dysuria, frequency, hesitancy or incontinence.  Denies headaches, seizures, numbness, or tingling. Denies depression, anxiety or insomnia. Denies skin break down or rash.   PE  BP 114/74   Pulse 98   Resp 16   Ht 5\' 7"  (1.702 m)   Wt 169 lb (76.7 kg)   SpO2 98%   BMI 26.47 kg/m   Patient alert and oriented and in no cardiopulmonary distress.  HEENT: No facial asymmetry, EOMI,   oropharynx pink and moist.  Neck decreased ROM,  no JVD, no mass.  Chest: Clear to auscultation bilaterally.  CVS: S1, S2 no murmurs, no S3.Regular rate.  ABD: Soft non tender.   Ext: No edema  MS: decreased  ROM spine, shoulders, hips and knees.  Skin: Intact, no ulcerations or rash noted.  Psych: Good eye contact, normal affect. Memory intact not anxious or depressed appearing.  CNS: CN 2-12 intact, power,  normal throughout.no focal deficits noted.   [facility] discharge follow-up Patient improved significantly following hospitalization for pneumonia. She has completed her antibiotic course , is asymptomatic and exam at visit is normal She has a f/u with pulmonary clinic  in [place] next week, which she will keep. No imaging studies or rept labs are ordered today  Dyslipidemia Hyperlipidemia:Low fat diet discussed and encouraged.   Lipid Panel  Lab Results  Component Value Date   CHOL 184 10/22/2015   HDL 35 (L) 10/22/2015   LDLCALC 136 (H) 10/22/2015   TRIG 65 10/22/2015   CHOLHDL 5.3 (H) 10/22/2015   Updated lab needed at/ before next visit.

## 2016-10-02 ENCOUNTER — Other Ambulatory Visit: Payer: Self-pay | Admitting: Family Medicine

## 2016-10-06 ENCOUNTER — Other Ambulatory Visit: Payer: Self-pay | Admitting: Family Medicine

## 2016-10-08 ENCOUNTER — Other Ambulatory Visit: Payer: Self-pay | Admitting: Family Medicine

## 2016-10-08 ENCOUNTER — Telehealth: Payer: Self-pay

## 2016-10-08 DIAGNOSIS — N3000 Acute cystitis without hematuria: Secondary | ICD-10-CM

## 2016-10-08 DIAGNOSIS — J189 Pneumonia, unspecified organism: Secondary | ICD-10-CM

## 2016-10-08 DIAGNOSIS — R1031 Right lower quadrant pain: Secondary | ICD-10-CM

## 2016-10-08 NOTE — Telephone Encounter (Signed)
Intermittent abd pain x 2.5 weeks primarily RLQ on avg every 2 to 3 days, tylenol relieves, pain currently a 5 not responding to tylenol, also note s dysuria and frequency Recommend abd x ray and UA and c/s, will get these tomorrow pls order labs and have labeled cupo at the desk for her, I will order x ray ,thanks Denies N/v/ D, advised if worsens to go to the eD or come for clinical eval

## 2016-10-08 NOTE — Telephone Encounter (Signed)
Pt called stating she is having abd pain that goes to her back, she thinks she may have kidney stones.inhaled corticosteroids asking if you will order an ultrasound.

## 2016-10-09 ENCOUNTER — Other Ambulatory Visit: Payer: Self-pay

## 2016-10-09 DIAGNOSIS — N3000 Acute cystitis without hematuria: Secondary | ICD-10-CM | POA: Diagnosis not present

## 2016-10-09 NOTE — Telephone Encounter (Signed)
Tests ordered and will wait for patient to come in

## 2016-10-09 NOTE — Addendum Note (Signed)
Addended by: Eual Fines on: 10/09/2016 10:05 AM   Modules accepted: Orders

## 2016-10-10 LAB — URINALYSIS W MICROSCOPIC + REFLEX CULTURE
Bilirubin Urine: NEGATIVE
CRYSTALS: NONE SEEN [HPF]
Glucose, UA: NEGATIVE
KETONES UR: NEGATIVE
Leukocytes, UA: NEGATIVE
Nitrite: NEGATIVE
Specific Gravity, Urine: 1.022 (ref 1.001–1.035)
Yeast: NONE SEEN [HPF]
pH: 6 (ref 5.0–8.0)

## 2016-10-12 ENCOUNTER — Encounter: Payer: Self-pay | Admitting: Family Medicine

## 2016-10-28 ENCOUNTER — Ambulatory Visit: Payer: Medicare Other | Admitting: Family Medicine

## 2016-11-04 DIAGNOSIS — R102 Pelvic and perineal pain: Secondary | ICD-10-CM | POA: Diagnosis not present

## 2016-11-04 DIAGNOSIS — Z30431 Encounter for routine checking of intrauterine contraceptive device: Secondary | ICD-10-CM | POA: Diagnosis not present

## 2016-11-05 ENCOUNTER — Ambulatory Visit (HOSPITAL_COMMUNITY)
Admission: RE | Admit: 2016-11-05 | Discharge: 2016-11-05 | Disposition: A | Discharge status: Home / Self Care | Payer: Medicare Other | Source: Ambulatory Visit | Attending: Family Medicine | Admitting: Family Medicine

## 2016-11-05 DIAGNOSIS — J189 Pneumonia, unspecified organism: Secondary | ICD-10-CM | POA: Diagnosis not present

## 2016-11-05 DIAGNOSIS — R1031 Right lower quadrant pain: Secondary | ICD-10-CM | POA: Diagnosis not present

## 2016-11-06 ENCOUNTER — Other Ambulatory Visit: Payer: Self-pay | Admitting: Family Medicine

## 2016-11-06 ENCOUNTER — Encounter: Payer: Self-pay | Admitting: Family Medicine

## 2016-11-06 DIAGNOSIS — R102 Pelvic and perineal pain: Secondary | ICD-10-CM | POA: Diagnosis not present

## 2016-11-06 DIAGNOSIS — I517 Cardiomegaly: Secondary | ICD-10-CM

## 2016-11-06 DIAGNOSIS — Z30431 Encounter for routine checking of intrauterine contraceptive device: Secondary | ICD-10-CM | POA: Diagnosis not present

## 2016-11-06 DIAGNOSIS — G129 Spinal muscular atrophy, unspecified: Secondary | ICD-10-CM

## 2016-11-09 ENCOUNTER — Other Ambulatory Visit: Payer: Self-pay

## 2016-11-09 DIAGNOSIS — R319 Hematuria, unspecified: Secondary | ICD-10-CM

## 2016-11-20 DIAGNOSIS — G121 Other inherited spinal muscular atrophy: Secondary | ICD-10-CM | POA: Diagnosis not present

## 2016-11-20 DIAGNOSIS — I517 Cardiomegaly: Secondary | ICD-10-CM | POA: Diagnosis not present

## 2016-11-20 DIAGNOSIS — J984 Other disorders of lung: Secondary | ICD-10-CM | POA: Diagnosis not present

## 2016-11-20 DIAGNOSIS — I10 Essential (primary) hypertension: Secondary | ICD-10-CM | POA: Diagnosis not present

## 2016-11-20 DIAGNOSIS — R030 Elevated blood-pressure reading, without diagnosis of hypertension: Secondary | ICD-10-CM | POA: Diagnosis not present

## 2016-11-23 DIAGNOSIS — I517 Cardiomegaly: Secondary | ICD-10-CM | POA: Diagnosis not present

## 2016-11-23 DIAGNOSIS — G121 Other inherited spinal muscular atrophy: Secondary | ICD-10-CM | POA: Diagnosis not present

## 2016-11-23 DIAGNOSIS — J984 Other disorders of lung: Secondary | ICD-10-CM | POA: Diagnosis not present

## 2016-11-25 ENCOUNTER — Encounter: Payer: Self-pay | Admitting: Cardiology

## 2016-11-25 DIAGNOSIS — G121 Other inherited spinal muscular atrophy: Secondary | ICD-10-CM | POA: Diagnosis not present

## 2016-11-25 DIAGNOSIS — I517 Cardiomegaly: Secondary | ICD-10-CM | POA: Diagnosis not present

## 2016-11-25 DIAGNOSIS — J984 Other disorders of lung: Secondary | ICD-10-CM | POA: Diagnosis not present

## 2016-11-30 ENCOUNTER — Other Ambulatory Visit: Payer: Self-pay | Admitting: Family Medicine

## 2016-12-04 ENCOUNTER — Other Ambulatory Visit: Payer: Self-pay | Admitting: Family Medicine

## 2016-12-07 DIAGNOSIS — J449 Chronic obstructive pulmonary disease, unspecified: Secondary | ICD-10-CM | POA: Diagnosis not present

## 2016-12-07 DIAGNOSIS — J4540 Moderate persistent asthma, uncomplicated: Secondary | ICD-10-CM | POA: Diagnosis not present

## 2016-12-07 DIAGNOSIS — G121 Other inherited spinal muscular atrophy: Secondary | ICD-10-CM | POA: Diagnosis not present

## 2016-12-07 DIAGNOSIS — J984 Other disorders of lung: Secondary | ICD-10-CM | POA: Diagnosis not present

## 2016-12-17 ENCOUNTER — Telehealth: Payer: Self-pay | Admitting: Family Medicine

## 2016-12-17 ENCOUNTER — Other Ambulatory Visit: Payer: Self-pay

## 2016-12-17 MED ORDER — FLUOCINOLONE ACETONIDE SCALP 0.01 % EX OIL: 1.0000 "application " | TOPICAL_OIL | CUTANEOUS | 2 refills | Status: DC | PRN

## 2016-12-17 NOTE — Telephone Encounter (Signed)
States its covered by [facility] on the formulary. Sent in

## 2016-12-17 NOTE — Telephone Encounter (Signed)
Patient calling in ref to scalp oil Rx .  She has medicare and medicaid and they used to pay for it, but they no longer cover it now. Is there another that is on the APPROVED list?  She has run out completely . [facility]

## 2016-12-27 ENCOUNTER — Other Ambulatory Visit: Payer: Self-pay | Admitting: Family Medicine

## 2017-01-12 ENCOUNTER — Encounter (HOSPITAL_COMMUNITY): Payer: Self-pay | Admitting: *Deleted

## 2017-01-12 ENCOUNTER — Emergency Department (HOSPITAL_COMMUNITY)
Admission: EM | Admit: 2017-01-12 | Discharge: 2017-01-13 | Disposition: A | Discharge status: Home / Self Care | Payer: Medicare Other | Attending: Emergency Medicine | Admitting: Emergency Medicine

## 2017-01-12 DIAGNOSIS — J45909 Unspecified asthma, uncomplicated: Secondary | ICD-10-CM | POA: Insufficient documentation

## 2017-01-12 DIAGNOSIS — G4489 Other headache syndrome: Secondary | ICD-10-CM | POA: Insufficient documentation

## 2017-01-12 DIAGNOSIS — R42 Dizziness and giddiness: Secondary | ICD-10-CM | POA: Diagnosis present

## 2017-01-12 DIAGNOSIS — Z79899 Other long term (current) drug therapy: Secondary | ICD-10-CM | POA: Insufficient documentation

## 2017-01-12 DIAGNOSIS — R0602 Shortness of breath: Secondary | ICD-10-CM | POA: Diagnosis not present

## 2017-01-12 DIAGNOSIS — R030 Elevated blood-pressure reading, without diagnosis of hypertension: Secondary | ICD-10-CM

## 2017-01-12 DIAGNOSIS — R51 Headache: Secondary | ICD-10-CM | POA: Diagnosis not present

## 2017-01-12 NOTE — ED Triage Notes (Signed)
Pt c/o ringing in her right ear and headache that started Saturday after being outside in the head for "awhile"

## 2017-01-13 ENCOUNTER — Emergency Department (HOSPITAL_COMMUNITY): Payer: Medicare Other

## 2017-01-13 ENCOUNTER — Encounter: Payer: Self-pay | Admitting: Family Medicine

## 2017-01-13 DIAGNOSIS — R0602 Shortness of breath: Secondary | ICD-10-CM | POA: Diagnosis not present

## 2017-01-13 DIAGNOSIS — G4489 Other headache syndrome: Secondary | ICD-10-CM | POA: Diagnosis not present

## 2017-01-13 LAB — CBC WITH DIFFERENTIAL/PLATELET
BASOS ABS: 0.1 10*3/uL (ref 0.0–0.1)
Basophils Relative: 0 %
Eosinophils Absolute: 0.2 10*3/uL (ref 0.0–0.7)
Eosinophils Relative: 1 %
HEMATOCRIT: 42.6 % (ref 36.0–46.0)
Hemoglobin: 14.1 g/dL (ref 12.0–15.0)
LYMPHS ABS: 5.6 10*3/uL — AB (ref 0.7–4.0)
LYMPHS PCT: 38 %
MCH: 28.3 pg (ref 26.0–34.0)
MCHC: 33.1 g/dL (ref 30.0–36.0)
MCV: 85.5 fL (ref 78.0–100.0)
Monocytes Absolute: 0.7 10*3/uL (ref 0.1–1.0)
Monocytes Relative: 5 %
Neutro Abs: 8.3 10*3/uL — ABNORMAL HIGH (ref 1.7–7.7)
Neutrophils Relative %: 56 %
Platelets: 265 10*3/uL (ref 150–400)
RBC: 4.98 MIL/uL (ref 3.87–5.11)
RDW: 13.9 % (ref 11.5–15.5)
WBC: 14.8 10*3/uL — AB (ref 4.0–10.5)

## 2017-01-13 LAB — BASIC METABOLIC PANEL
ANION GAP: 10 (ref 5–15)
BUN: 11 mg/dL (ref 6–20)
CHLORIDE: 101 mmol/L (ref 101–111)
CO2: 25 mmol/L (ref 22–32)
Calcium: 9.3 mg/dL (ref 8.9–10.3)
Creatinine, Ser: <0.3 mg/dL — ABNORMAL LOW (ref 0.44–1.00)
Glucose, Bld: 92 mg/dL (ref 65–99)
POTASSIUM: 4.1 mmol/L (ref 3.5–5.1)
SODIUM: 136 mmol/L (ref 135–145)

## 2017-01-13 MED ORDER — DIPHENHYDRAMINE HCL 50 MG/ML IJ SOLN
12.5000 mg | Freq: Once | INTRAMUSCULAR | Status: AC
  Administered 2017-01-13: 12.5 mg via INTRAVENOUS
  Filled 2017-01-13: qty 1

## 2017-01-13 MED ORDER — METOCLOPRAMIDE HCL 5 MG/ML IJ SOLN
5.0000 mg | Freq: Once | INTRAMUSCULAR | Status: AC
  Administered 2017-01-13: 5 mg via INTRAVENOUS
  Filled 2017-01-13: qty 2

## 2017-01-13 MED ORDER — SODIUM CHLORIDE 0.9 % IV BOLUS (SEPSIS)
500.0000 mL | Freq: Once | INTRAVENOUS | Status: AC
  Administered 2017-01-13: 500 mL via INTRAVENOUS

## 2017-01-13 NOTE — Discharge Instructions (Signed)

## 2017-01-13 NOTE — ED Provider Notes (Signed)
[facility] DEPT Provider Note   CSN: [id number] Arrival date & time: 01/12/17  2305     History   Chief Complaint Chief Complaint  Patient presents with  . Dizziness  . Headache    HPI Janet Acevedo is a 26 y.o. female.  The history is provided by the patient.  Dizziness  Quality:  Lightheadedness Severity:  Moderate Onset quality:  Gradual Duration:  4 days Timing:  Intermittent Progression:  Unchanged Chronicity:  New Relieved by:  Nothing Worsened by:  Nothing Associated symptoms: headaches, shortness of breath and tinnitus   Associated symptoms: no chest pain, no syncope and no vomiting   Headache   Associated symptoms include shortness of breath. Pertinent negatives include no fever, no syncope and no vomiting.  patient with h/o spinal muscle atrophy, asthma who presents with headache, lightheadedness, shortness of breath and tinnitus for 4 days She reports this occurred while she was outside in heat for several hrs She has tried to rehydrate but no improvement No fever/vomiting No CP No syncope No new LE edema She reports blurred vision but no visual loss No focal weakness No h/o stroke or cardiac illnesses No rash/tick bites reported  Past Medical History:  Diagnosis Date  . Osteomyelitis [facility]) June 2011  . Perennial allergic rhinitis   . Seborrheic dermatitis of scalp 2006  . Spinal muscle atrophy ([place])    type 2/3 18 months    Patient Active Problem List   Diagnosis Date Noted  . Hospital discharge follow-up 09/28/2016  . Dyslipidemia 09/28/2016  . Asthma occurring only with upper respiratory infection 02/10/2015  . Seborrheic dermatitis of scalp 02/07/2015  . Urinary incontinence due to severe physical disability 02/12/2014  . Morbid obesity ([place]) 04/03/2011  . DERMATOMYCOSIS 06/29/2010  . GERD 06/29/2010  . Muscular atrophy, proximal spinal, chronic ([place]) 02/24/2010  . Seasonal allergies 02/24/2010    Past Surgical History:    Procedure Laterality Date  . bilateral hip reinsertion  under age 66   hips out of socket, pt was only able to cruise hold on and move short distances   . BONE BIOPSY     spinal bone   . MIRENA PLACED 10/27/10    . SPINAL FUSION  1999   rods placed in thoracolumbar spine to excessive scoliosis primarily     OB History    No data available       Home Medications    Prior to Admission medications   Medication Sig Start Date End Date Taking? Authorizing Provider  albuterol (ACCUNEB) 1.25 MG/3ML nebulizer solution Take 1.25 mg by nebulization every 4 (four) hours as needed.    [provider]  azelastine (ASTELIN) 0.1 % nasal spray PLACE 2 SPRAYS INTO BOTH NOSTRILS 2 (TWO) TIMES DAILY. USE IN EACH NOSTRIL AS DIRECTED 09/30/16   Fayrene Helper, MD  betamethasone dipropionate (DIPROLENE) 0.05 % cream APPLY TOPICALLY 2 (TWO) TIMES DAILY. 12/04/16   Fayrene Helper, MD  Biotin (BIOTIN MAXIMUM STRENGTH) 10 MG TABS Take 1 tablet by mouth daily.    [provider]  Calcium Citrate (CITRACAL PO) Take 1 capsule by mouth daily.     [provider]  cromolyn (OPTICROM) 4 % ophthalmic solution PLACE 1 DROP INTO BOTH EYES FOUR TIMES DAILY 12/28/16   Fayrene Helper, MD  Fluocinolone Acetonide Scalp 0.01 % OIL Apply 1 application topically as needed. 12/17/16   Fayrene Helper, MD  ketoconazole (NIZORAL) 2 % cream Apply 1 application topically as needed.  dermatitis 07/02/14   [provider]  levalbuterol Penne Lash) 1.25 MG/3ML nebulizer solution Take 1.25 mg by nebulization every 6 (six) hours as needed for wheezing. 08/18/16   Fayrene Helper, MD  levocetirizine (XYZAL) 5 MG tablet TAKE 1 TABLET (5 MG TOTAL) BY MOUTH EVERY EVENING. 08/24/16   Fayrene Helper, MD  montelukast (SINGULAIR) 10 MG tablet TAKE 1 TABLET BY MOUTH ONCE A DAY 10/05/16   Fayrene Helper, MD  Multiple Vitamin (MULTIVITAMIN) tablet Take 1 tablet by mouth daily.      [provider]  nystatin (MYCOSTATIN/NYSTOP) powder APPLY TWICE DAILY TO AFFECTED AREA AS NEEDED 10/07/16   Fayrene Helper, MD  omeprazole (PRILOSEC) 20 MG capsule TAKE 1 CAPSULE EVERY DAY 08/24/16   Fayrene Helper, MD  Spacer/Aero-Holding Chambers (AEROCHAMBER PLUS) inhaler Use as instructed 10/04/15   Fayrene Helper, MD  SYMBICORT 160-4.5 MCG/ACT inhaler INHALE 2 PUFFS INTO THE LUNGS 2 TIMES DAILY. 12/04/16   Fayrene Helper, MD  VENTOLIN HFA 108 (90 Base) MCG/ACT inhaler INHALE 2 PUFFS INTO THE LUNGS EVERY 4 (FOUR) HOURS AS NEEDED FOR WHEEZING OR SHORTNESS OF BREATH. 03/31/16   Fayrene Helper, MD    Family History Family History  Problem Relation Age of Onset  . Hyperlipidemia Mother   . Hypertension Mother   . Hypertension Brother   . Diabetes Brother        borderline     Social History Social History  Substance Use Topics  . Smoking status: Never Smoker  . Smokeless tobacco: Never Used  . Alcohol use No     Allergies   Ciprofloxacin   Review of Systems Review of Systems  Constitutional: Negative for fever.  HENT: Positive for tinnitus.   Eyes:       Blurred vision   Respiratory: Positive for shortness of breath.   Cardiovascular: Negative for chest pain and syncope.  Gastrointestinal: Negative for vomiting.  Skin: Negative for rash.  Neurological: Positive for dizziness and headaches. Negative for syncope.  All other systems reviewed and are negative.    Physical Exam Updated Vital Signs BP (!) 153/104   Pulse 96   Temp 98.8 F (37.1 C) (Oral)   Resp 20   Ht 1.702 m (5\' 7" )   Wt 76.7 kg (169 lb)   SpO2 100%   BMI 26.47 kg/m   Physical Exam CONSTITUTIONAL: no distress noted, pt wheelchair bound HEAD: Normocephalic/atraumatic EYES: EOMI/PERRL ENMT: Mucous membranes moist, bilateral TM clear/intact NECK: supple no meningeal signs CV: S1/S2 noted, no murmurs/rubs/gallops noted LUNGS: Lungs are clear to auscultation bilaterally, no  apparent distress ABDOMEN: soft, nontender NEURO: Pt is awake/alert/appropriate, no facial droop.  No arm drift EXTREMITIES: LE in braces SKIN: warm, color normal PSYCH: no abnormalities of mood noted, alert and oriented to situation   ED Treatments / Results  Labs (all labs ordered are listed, but only abnormal results are displayed) Labs Reviewed  CBC WITH DIFFERENTIAL/PLATELET - Abnormal; Notable for the following:       Result Value   WBC 14.8 (*)    Neutro Abs 8.3 (*)    Lymphs Abs 5.6 (*)    All other components within normal limits  BASIC METABOLIC PANEL    EKG  EKG Interpretation None       Radiology No results found.  Procedures Procedures (including critical care time)  Medications Ordered in ED Medications  metoCLOPramide (REGLAN) injection 5 mg (5 mg Intravenous Given 01/13/17 0139)  diphenhydrAMINE (BENADRYL)  injection 12.5 mg (12.5 mg Intravenous Given 01/13/17 0138)  sodium chloride 0.9 % bolus 500 mL (500 mLs Intravenous New Bag/Given 01/13/17 0140)     Initial Impression / Assessment and Plan / ED Course  I have reviewed the triage vital signs and the nursing notes.  Pertinent labs & imaging results that were available during my care of the patient were reviewed by me and considered in my medical decision making (see chart for details).     1:15 AM Pt is h/o muscle atrophy/wheelchair bound has had HA/lightheadedness for past 4 days No fever/vomiting No focal weakness Noted to have elevated BP.  Per records, her BP has been trending up.  Recent echo due to cardiomegaly - normal EF She reports all symptoms started after heat exposure She is well appearing/no distress symptoms could be related to untreated HTN Will start with labs/meds/fluids and CXR due to SOB Pt agreeable 2:30 AM Pt reports HA and dizziness improved She feels mildly anxious from reglan but overall improved Labs pending at this time 3:11 AM BP 129/90   Pulse 85   Temp 98.8  F (37.1 C) (Oral)   Resp 20   Ht 1.702 m (5\' 7" )   Wt 76.7 kg (169 lb)   SpO2 100%   BMI 26.47 kg/m  Pt improved BP improved Labs reassuring CXR negative Suspect symptoms were related to elevated blood pressure She did not appear to have acute stroke No signs of meningitis.  Due to improvement of symptoms I doubt pseudotumor I feel she is appropriate for discharge  Due to improvement in BP, will defer meds I have asked her to check BP everyday, record this number and report to her PCP.  She should see her PCP next week  Final Clinical Impressions(s) / ED Diagnoses   Final diagnoses:  Elevated blood pressure, situational  Other headache syndrome  Lightheadedness  Shortness of breath    New Prescriptions New Prescriptions   No medications on file     Ripley Fraise, MD 01/13/17 [phone]

## 2017-01-13 NOTE — ED Notes (Signed)
Pt c/o increased dizziness since meds were given, edp notified.

## 2017-01-14 ENCOUNTER — Ambulatory Visit (INDEPENDENT_AMBULATORY_CARE_PROVIDER_SITE_OTHER): Payer: Medicare Other | Admitting: Family Medicine

## 2017-01-14 ENCOUNTER — Encounter: Payer: Self-pay | Admitting: Family Medicine

## 2017-01-14 VITALS — HR 98 | Temp 98.1°F | Resp 17 | Ht 67.0 in

## 2017-01-14 DIAGNOSIS — Z981 Arthrodesis status: Secondary | ICD-10-CM | POA: Insufficient documentation

## 2017-01-14 DIAGNOSIS — F419 Anxiety disorder, unspecified: Secondary | ICD-10-CM | POA: Diagnosis not present

## 2017-01-14 DIAGNOSIS — I10 Essential (primary) hypertension: Secondary | ICD-10-CM

## 2017-01-14 DIAGNOSIS — J4540 Moderate persistent asthma, uncomplicated: Secondary | ICD-10-CM | POA: Insufficient documentation

## 2017-01-14 DIAGNOSIS — M8580 Other specified disorders of bone density and structure, unspecified site: Secondary | ICD-10-CM | POA: Insufficient documentation

## 2017-01-14 MED ORDER — ALPRAZOLAM 0.25 MG PO TABS: 0.2500 mg | ORAL_TABLET | Freq: Three times a day (TID) | ORAL | 0 refills | Status: DC | PRN

## 2017-01-14 NOTE — Patient Instructions (Signed)
Try to drink more water Take the alprazolam as directed Take one tab, may take 2 if one not effective See Dr Moshe Cipro next week Call me if not improving before then Bring your BP cuff next time

## 2017-01-14 NOTE — Progress Notes (Signed)
Chief Complaint  Patient presents with  . Dizziness    has been having lightheadedness and dizziness since saturday. Also some pressure behind mainly her right ear  . Shortness of Breath    Some sob and rapid heartrate sometimes with anxiousness    Seen in ER on 01/12/17 for elevated BP, dizziness and elevated BP She started having symptoms at a baseball tournament over the weekend.  Sitting outside , felt lightheaded.  "might have been dehydrated".  Continued to feel bad, mother has longstanding hypertension so they checked BP at home.  Her BP was high enough to concern her so she went to the ER, it measured 153/104 there. EKG and labs and evaluation unremarkable, she was thought to be anxious, and after calming her BP returned to normal. I got a call at home last night that she was still anxious, dizzy, and had elevated BP.  I asked her to come in today. She has no specific complaint,  Vague lightheadedness, "out of sorts", no appetite, anxious, rapid heartbeat, tremulous.  No change in bowels or bladder. In general is sensitive and worries a lot, but has never had treatment for depression or anxiety No changes in life to warrant anxiety reaction - she did miss a semester of school due to illness. Is recovered physically. No thoughts of harming self.  Not sad or blue.  Says she is worried about her health and her BP, but her worry seems excessive     Patient Active Problem List   Diagnosis Date Noted  . Moderate persistent asthma 01/14/2017  . H/O spinal fusion 01/14/2017  . Osteopenia determined by x-ray 01/14/2017  . Hospital discharge follow-up 09/28/2016  . Dyslipidemia 09/28/2016  . Seborrheic dermatitis of scalp 02/07/2015  . Urinary incontinence due to severe physical disability 02/12/2014  . Morbid obesity ([place]) 04/03/2011  . DERMATOMYCOSIS 06/29/2010  . GERD 06/29/2010  . Type II spinal muscular atrophy ([place]) 02/24/2010  . Seasonal allergies 02/24/2010    Outpatient  Encounter Prescriptions as of 01/14/2017  Medication Sig  . albuterol (ACCUNEB) 1.25 MG/3ML nebulizer solution Take 1.25 mg by nebulization every 4 (four) hours as needed.  [name] azelastine (ASTELIN) 0.1 % nasal spray PLACE 2 SPRAYS INTO BOTH NOSTRILS 2 (TWO) TIMES DAILY. USE IN EACH NOSTRIL AS DIRECTED  . betamethasone dipropionate (DIPROLENE) 0.05 % cream APPLY TOPICALLY 2 (TWO) TIMES DAILY.  [name] Biotin (BIOTIN MAXIMUM STRENGTH) 10 MG TABS Take 1 tablet by mouth daily.  . cromolyn (OPTICROM) 4 % ophthalmic solution PLACE 1 DROP INTO BOTH EYES FOUR TIMES DAILY  . Fluocinolone Acetonide Scalp 0.01 % OIL Apply 1 application topically as needed.  [name] ketoconazole (NIZORAL) 2 % cream Apply 1 application topically as needed. dermatitis  . levocetirizine (XYZAL) 5 MG tablet TAKE 1 TABLET (5 MG TOTAL) BY MOUTH EVERY EVENING.  . montelukast (SINGULAIR) 10 MG tablet TAKE 1 TABLET BY MOUTH ONCE A DAY  . Multiple Vitamin (MULTIVITAMIN) tablet Take 1 tablet by mouth daily.    [name] nystatin (MYCOSTATIN/NYSTOP) powder APPLY TWICE DAILY TO AFFECTED AREA AS NEEDED  . omeprazole (PRILOSEC) 20 MG capsule TAKE 1 CAPSULE EVERY DAY  . Spacer/Aero-Holding Chambers (AEROCHAMBER PLUS) inhaler Use as instructed  . SYMBICORT 160-4.5 MCG/ACT inhaler INHALE 2 PUFFS INTO THE LUNGS 2 TIMES DAILY.  . VENTOLIN HFA 108 (90 Base) MCG/ACT inhaler INHALE 2 PUFFS INTO THE LUNGS EVERY 4 (FOUR) HOURS AS NEEDED FOR WHEEZING OR SHORTNESS OF BREATH.  . [DISCONTINUED] Calcium Citrate (CITRACAL PO) Take  1 capsule by mouth daily.   [name] ALPRAZolam (XANAX) 0.25 MG tablet Take 1 tablet (0.25 mg total) by mouth 3 (three) times daily as needed for anxiety. MAY REPEAT DOSE IN ONE HOUR IF ONE PILL NOT EFFECTIVE  . levalbuterol (XOPENEX) 1.25 MG/3ML nebulizer solution Take 1.25 mg by nebulization every 6 (six) hours as needed for wheezing. (Patient not taking: Reported on 01/14/2017)   No facility-administered encounter medications on file as of 01/14/2017.      Allergies  Allergen Reactions  . Ciprofloxacin Nausea And Vomiting    Review of Systems  Constitutional: Positive for activity change, appetite change and fatigue. Negative for unexpected weight change.  HENT: Negative for congestion, dental problem and trouble swallowing.   Eyes: Negative for photophobia and visual disturbance.  Respiratory: Positive for shortness of breath. Negative for cough.        Chronic  Cardiovascular: Negative for chest pain, palpitations and leg swelling.  Gastrointestinal: Negative for abdominal pain, constipation and diarrhea.  Genitourinary: Negative for difficulty urinating and frequency.  Musculoskeletal: Negative for arthralgias and back pain.  Skin: Negative.  Negative for rash.  Neurological: Positive for weakness and light-headedness. Negative for dizziness.       Chronic muscle weakness Vague lightheadedness.  NOT balance or spinning  Psychiatric/Behavioral: Positive for sleep disturbance. Negative for dysphoric mood and suicidal ideas. The patient is nervous/anxious.     Pulse 98   Temp 98.1 F (36.7 C) (Temporal)   Resp 17   Ht 5\' 7"  (1.702 m)   SpO2 99%   Physical Exam  Constitutional: She is oriented to person, place, and time. She appears well-nourished.  Large abdominal girth with extremity wasting.  In wheelchair.   HENT:  Head: Normocephalic and atraumatic.  Right Ear: External ear normal.  Left Ear: External ear normal.  Mouth/Throat: Oropharynx is clear and moist.  Cannot see post pharynx.  Mouth dry  Eyes: Conjunctivae are normal. Pupils are equal, round, and reactive to light.  Discs flat  Neck: Normal range of motion. No thyromegaly present.  Cardiovascular: Normal rate, regular rhythm and normal heart sounds.   Pulmonary/Chest: Effort normal and breath sounds normal. She has no wheezes.  Abdominal: Soft. Bowel sounds are normal.  Musculoskeletal: She exhibits deformity.  Flexion contractures both UE, weakness all 4  extremities.  No focal findings  Lymphadenopathy:    She has no cervical adenopathy.  Neurological: She is alert and oriented to person, place, and time. She displays abnormal reflex. She exhibits abnormal muscle tone.  Psychiatric: Her behavior is normal. Thought content normal.  Emotional lability.  Tremulous.    ASSESSMENT/PLAN:   1. Anxiety  2. Elevated blood pressure reading in office with diagnosis of hypertension  Discussed with Blossom and her mother my impressions.  I believe the primary problem is anxiety.  Poss depression.  Is feeling overwhelmed and stressed and this makes her BP go up.  Then is overly concerned about her pressure which is not dangerous.  Discussed drug of choice is a SSRI.  Unfortunately takes 4-6 weeks to kick in and Rosabel seems reluctant to take a long term medicine for a perceived temporary problem.  Does not need a BP medicine today. Discussed diagnosis of hypertension requires 3 elevated readings Discussed pitfalls of benzodiazepine use and dependence and respiratory depression.  She will try a LIMITED number of low dose xanax to get started and see if her anxiety is indeed the problem.  Return to see her usual PCP to discuss  further treatment. Greater than 50% of this visit was spent in counseling and coordinating care.  Total face to face time:   40 min   Patient Instructions  Try to drink more water Take the alprazolam as directed Take one tab, may take 2 if one not effective See Dr Moshe Cipro next week Call me if not improving before then Bring your BP cuff next time   Raylene Everts, MD

## 2017-01-20 ENCOUNTER — Encounter: Payer: Self-pay | Admitting: Family Medicine

## 2017-01-20 ENCOUNTER — Ambulatory Visit (INDEPENDENT_AMBULATORY_CARE_PROVIDER_SITE_OTHER): Payer: Medicare Other | Admitting: Family Medicine

## 2017-01-20 VITALS — BP 130/88 | HR 92 | Resp 16 | Ht 67.0 in

## 2017-01-20 DIAGNOSIS — Z23 Encounter for immunization: Secondary | ICD-10-CM | POA: Diagnosis not present

## 2017-01-20 DIAGNOSIS — G129 Spinal muscular atrophy, unspecified: Secondary | ICD-10-CM

## 2017-01-20 DIAGNOSIS — K219 Gastro-esophageal reflux disease without esophagitis: Secondary | ICD-10-CM | POA: Diagnosis not present

## 2017-01-20 DIAGNOSIS — E559 Vitamin D deficiency, unspecified: Secondary | ICD-10-CM

## 2017-01-20 DIAGNOSIS — Z79899 Other long term (current) drug therapy: Secondary | ICD-10-CM | POA: Diagnosis not present

## 2017-01-20 DIAGNOSIS — R030 Elevated blood-pressure reading, without diagnosis of hypertension: Secondary | ICD-10-CM | POA: Diagnosis not present

## 2017-01-20 DIAGNOSIS — E785 Hyperlipidemia, unspecified: Secondary | ICD-10-CM

## 2017-01-20 DIAGNOSIS — R7301 Impaired fasting glucose: Secondary | ICD-10-CM | POA: Diagnosis not present

## 2017-01-20 NOTE — Patient Instructions (Addendum)
F/u in July as before, call if you need me sooner  Fasting labs 3 to 5 days before visit  Test and record blood pressure twice daily  PLEASE cut back on salt intake and increase fresh fruit and vegetable intake to be healthy and lower your blood pressure   Prevnar in office today  Thank you  for choosing [facility]. We consider it a privelige to serve you.  Delivering excellent health care in a caring and  compassionate way is our goal.  Partnering with you,  so that together we can achieve this goal is our strategy.   No excessive exposure to heat and sun, and adequate fluid intake please!

## 2017-01-25 DIAGNOSIS — R030 Elevated blood-pressure reading, without diagnosis of hypertension: Secondary | ICD-10-CM | POA: Insufficient documentation

## 2017-01-25 DIAGNOSIS — Z23 Encounter for immunization: Secondary | ICD-10-CM | POA: Insufficient documentation

## 2017-01-25 NOTE — Assessment & Plan Note (Signed)
Single marked elevated reading in ED when extremely anxious following excessive heat exposure, corrected on its own . Of note , diastolic blood pressure is elevated , pt educate re Dash diet and need to limit salt and lose weight  F/u in 4 to 5 weeks with fasting labs

## 2017-01-25 NOTE — Assessment & Plan Note (Signed)
After obtaining informed consent, the vaccine is  administered by LPN.  

## 2017-01-25 NOTE — Assessment & Plan Note (Signed)
Controlled, no change in medication  

## 2017-01-25 NOTE — Assessment & Plan Note (Signed)
    The importance of healthy food choices with portion control discussed. DASH diet and limiting salt intake is discussed and stressed. Needs to lose weight for improved health Goals set by the patient for the next several months.   Weight /BMI 01/20/2017 01/14/2017 01/12/2017  WEIGHT - - 169 lb  HEIGHT 5\' 7"  5\' 7"  5\' 7"   BMI - - 26.47 kg/m2

## 2017-01-25 NOTE — Progress Notes (Signed)
   Janet Acevedo     MRN: [id number]      DOB: 29-Jun-1991   HPI Janet Acevedo is here for follow up and re-evaluation of chronic medical conditions, medication management and review of any available recent lab and radiology data.  Preventive health is updated, specifically  Cancer screening and Immunization.   This is here 2nd f/u from and ED visit precipitated by anxiety following excessive heat exposure which resulted in elevated blood pressure. She was evaluated by dr Meda Coffee, who prescribed a limited number of xanax, Janet Acevedo has taken one only Record review reveals pre hyperetension, esp with new lower normal blood pressure, and she is counseled re the need to change diet , esp salt intake, as well as focus on limiting portions to facilitate weight loss, and push for the DASH diet for improved health She will do home blood pressure monitoring and call in with any concerns. Both herself and mother are satisfied. She was in the 90 plus degree temperature watching a game for approx 4 hrs with little to no water intake prior to the incident   ROS Denies recent fever or chills. Denies sinus pressure, nasal congestion, ear pain or sore throat. Denies chest congestion, productive cough or wheezing. Denies chest pains, palpitations and leg swelling Denies abdominal pain, nausea, vomiting,diarrhea or constipation.   Denies dysuria, frequency, hesitancy or incontinence. y. Denies headaches, seizures, numbness, or tingling. Denies depression, anxiety or insomnia. Denies skin break down or rash.   PE  BP 130/88   Pulse 92   Resp 16   Ht 5\' 7"  (1.702 m)   SpO2 96%   Patient alert and oriented and in no cardiopulmonary distress.  HEENT: No facial asymmetry, EOMI,   oropharynx pink and moist.  Neck supple no JVD, no mass.  Chest: Clear to auscultation bilaterally.decreased air entry throughout  CVS: S1, S2 no murmurs, no S3.Regular rate.  ABD: Soft non tender.   Ext: No  edema  OE:[id number]  ROM spine, shoulders, hips and knees.  Skin: Intact, no ulcerations or rash noted.  Psych: Good eye contact, normal affect. Memory intact not anxious or depressed appearing.  CNS: CN 2-12 intact, quadriplegic  Assessment & Plan  Elevated blood pressure reading without diagnosis of hypertension Single marked elevated reading in ED when extremely anxious following excessive heat exposure, corrected on its own . Of note , diastolic blood pressure is elevated , pt educate re Dash diet and need to limit salt and lose weight  F/u in 4 to 5 weeks with fasting labs  Need for vaccination with 13-polyvalent pneumococcal conjugate vaccine After obtaining informed consent, the vaccine is  administered by LPN.   GERD Controlled, no change in medication   Morbid obesity    The importance of healthy food choices with portion control discussed. DASH diet and limiting salt intake is discussed and stressed. Needs to lose weight for improved health Goals set by the patient for the next several months.   Weight /BMI 01/20/2017 01/14/2017 01/12/2017  WEIGHT - - 169 lb  HEIGHT 5\' 7"  5\' 7"  5\' 7"   BMI - - 26.47 kg/m2

## 2017-01-26 ENCOUNTER — Other Ambulatory Visit: Payer: Self-pay | Admitting: Family Medicine

## 2017-01-26 NOTE — Telephone Encounter (Signed)
Seen [phone] 

## 2017-02-08 ENCOUNTER — Encounter: Payer: Self-pay | Admitting: Family Medicine

## 2017-02-08 ENCOUNTER — Other Ambulatory Visit: Payer: Self-pay | Admitting: Family Medicine

## 2017-02-08 ENCOUNTER — Ambulatory Visit (INDEPENDENT_AMBULATORY_CARE_PROVIDER_SITE_OTHER): Payer: Medicare Other | Admitting: Family Medicine

## 2017-02-08 ENCOUNTER — Other Ambulatory Visit (HOSPITAL_COMMUNITY)
Admission: RE | Admit: 2017-02-08 | Discharge: 2017-02-08 | Disposition: A | Discharge status: Home / Self Care | Payer: Medicare Other | Source: Ambulatory Visit | Attending: Family Medicine | Admitting: Family Medicine

## 2017-02-08 VITALS — BP 124/84 | HR 100 | Resp 16

## 2017-02-08 DIAGNOSIS — R7301 Impaired fasting glucose: Secondary | ICD-10-CM | POA: Diagnosis not present

## 2017-02-08 DIAGNOSIS — F411 Generalized anxiety disorder: Secondary | ICD-10-CM | POA: Insufficient documentation

## 2017-02-08 DIAGNOSIS — E785 Hyperlipidemia, unspecified: Secondary | ICD-10-CM

## 2017-02-08 DIAGNOSIS — E559 Vitamin D deficiency, unspecified: Secondary | ICD-10-CM

## 2017-02-08 DIAGNOSIS — G121 Other inherited spinal muscular atrophy: Secondary | ICD-10-CM

## 2017-02-08 DIAGNOSIS — F419 Anxiety disorder, unspecified: Secondary | ICD-10-CM | POA: Insufficient documentation

## 2017-02-08 DIAGNOSIS — R030 Elevated blood-pressure reading, without diagnosis of hypertension: Secondary | ICD-10-CM | POA: Diagnosis not present

## 2017-02-08 LAB — CBC
HCT: 40.4 % (ref 36.0–46.0)
Hemoglobin: 13.3 g/dL (ref 12.0–15.0)
MCH: 28.1 pg (ref 26.0–34.0)
MCHC: 32.9 g/dL (ref 30.0–36.0)
MCV: 85.4 fL (ref 78.0–100.0)
Platelets: 270 10*3/uL (ref 150–400)
RBC: 4.73 MIL/uL (ref 3.87–5.11)
RDW: 14.2 % (ref 11.5–15.5)
WBC: 12 10*3/uL — ABNORMAL HIGH (ref 4.0–10.5)

## 2017-02-08 LAB — LIPID PANEL
CHOLESTEROL: 172 mg/dL (ref 0–200)
HDL: 33 mg/dL — AB (ref >40)
LDL CALC: 127 mg/dL — AB (ref 0–99)
TRIGLYCERIDES: 62 mg/dL (ref <150)
Total CHOL/HDL Ratio: 5.2 RATIO
VLDL: 12 mg/dL (ref 0–40)

## 2017-02-08 LAB — TSH: TSH: 1.393 u[IU]/mL (ref 0.350–4.500)

## 2017-02-08 MED ORDER — PAROXETINE HCL 10 MG PO TABS: 10.0000 mg | ORAL_TABLET | Freq: Every day | ORAL | 3 refills | Status: DC

## 2017-02-08 NOTE — Patient Instructions (Signed)
F/u in 8 weeks call if you need me before   New for anxiety is Paxil one  daily   You are referred to therapy, the office will call   Lab order wil be given for labs to be drawn at Hospital today due to difficult stick   I hope that you start feeling better soon  Thank you  for choosing [facility]. We consider it a privelige to serve you.  Delivering excellent health care in a caring and  compassionate way is our goal.  Partnering with you,  so that together we can achieve this goal is our strategy.

## 2017-02-09 ENCOUNTER — Encounter: Payer: Self-pay | Admitting: Family Medicine

## 2017-02-09 LAB — HEMOGLOBIN A1C
HEMOGLOBIN A1C: 5.1 % (ref 4.8–5.6)
MEAN PLASMA GLUCOSE: 100 mg/dL

## 2017-02-12 ENCOUNTER — Telehealth: Payer: Self-pay | Admitting: Family Medicine

## 2017-02-12 NOTE — Telephone Encounter (Signed)
Received call from [facility] lab stating that when patient came in for her labs, they had forgotten to order vitamin d lab. States that they were going to use blood left over from TSH draw, but could not find tube. She called [place] to see if we would call patient to have her come back for vit d lab. Patient called and notified.

## 2017-03-01 ENCOUNTER — Other Ambulatory Visit: Payer: Self-pay | Admitting: Family Medicine

## 2017-03-01 ENCOUNTER — Encounter: Payer: Self-pay | Admitting: Family Medicine

## 2017-03-02 ENCOUNTER — Other Ambulatory Visit: Payer: Self-pay | Admitting: Family Medicine

## 2017-03-02 MED ORDER — BUSPIRONE HCL 5 MG PO TABS: 5.0000 mg | ORAL_TABLET | Freq: Two times a day (BID) | ORAL | 2 refills | Status: DC

## 2017-03-02 NOTE — Progress Notes (Signed)
   Janet Acevedo     MRN: [id number]      DOB: Dec 03, 1990   HPI Janet Acevedo is here for follow up and re-evaluation of chronic medical conditions, medication management and review of any available recent lab and radiology data.  Preventive health is updated, specifically  Cancer screening and Immunization.   C/o increased anxiety and stress, esp in light of the recent deterioration of her only Aunt's health. She has become tearful and fearful, requests mental health help,interested in therapy and also medication ROS Denies recent fever or chills. Denies sinus pressure, nasal congestion, ear pain or sore throat. Denies chest congestion, productive cough or wheezing. Denies chest pains, palpitations and leg swelling Denies abdominal pain, nausea, vomiting,diarrhea or constipation.   Denies dysuria, frequency, hesitancy .  Denies skin break down or rash.   PE  BP 124/84   Pulse 100   Resp 16   SpO2 97%   Patient alert and oriented and in no cardiopulmonary distress.  HEENT: No facial asymmetry, EOMI,   oropharynx pink and moist.  Neck decreased ROM no JVD, no mass.  Chest: Clear to auscultation bilaterally.Decreased air entry  CVS: S1, S2 no murmurs, no S3.Regular rate.  ABD: Soft non tender.   Ext: No edema  MS: Decreased  ROM spine, shoulders, hips and knees.  Skin: Intact, no ulcerations or rash noted.  Psych: Good eye contact, tearful anxious and mildly  depressed appearing.  CNS: CN 2-12 intact, paraplegic.   Assessment & Plan  GAD (generalized anxiety disorder) Significant anxiety, overwhelmed with the deterioration in the health of her aunt, and she has her personal health challenges that she has been handling very well. Inerested in therapy and will benefit, also will start medication  Dyslipidemia Hyperlipidemia:Low fat diet discussed and encouraged.   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

## 2017-03-02 NOTE — Assessment & Plan Note (Signed)
Encouraged to monitor caloric intake and change food choice to plant based , healthy diet

## 2017-03-02 NOTE — Assessment & Plan Note (Signed)
Hyperlipidemia:Low fat diet discussed and encouraged.   Lipid Panel  Lab Results  Component Value Date   CHOL 172 02/08/2017   HDL 33 (L) 02/08/2017   LDLCALC 127 (H) 02/08/2017   TRIG 62 02/08/2017   CHOLHDL 5.2 02/08/2017     Needs to lower fat intake

## 2017-03-02 NOTE — Assessment & Plan Note (Signed)
Controlled, no change in medication  

## 2017-03-02 NOTE — Assessment & Plan Note (Signed)
Significant anxiety, overwhelmed with the deterioration in the health of her aunt, and she has her personal health challenges that she has been handling very well. Inerested in therapy and will benefit, also will start medication

## 2017-03-02 NOTE — Assessment & Plan Note (Signed)
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

## 2017-03-03 ENCOUNTER — Other Ambulatory Visit: Payer: Self-pay

## 2017-03-03 MED ORDER — UNABLE TO FIND: 99 refills | Status: DC

## 2017-03-06 ENCOUNTER — Other Ambulatory Visit: Payer: Self-pay | Admitting: Family Medicine

## 2017-03-26 ENCOUNTER — Encounter: Payer: Self-pay | Admitting: Family Medicine

## 2017-04-03 ENCOUNTER — Other Ambulatory Visit: Payer: Self-pay | Admitting: Family Medicine

## 2017-04-05 NOTE — Telephone Encounter (Signed)
Seen 02/08/17

## 2017-04-08 ENCOUNTER — Ambulatory Visit (INDEPENDENT_AMBULATORY_CARE_PROVIDER_SITE_OTHER): Payer: Medicare Other | Admitting: Family Medicine

## 2017-04-08 ENCOUNTER — Encounter: Payer: Self-pay | Admitting: Family Medicine

## 2017-04-08 VITALS — BP 128/82 | HR 94 | Resp 16

## 2017-04-08 DIAGNOSIS — E785 Hyperlipidemia, unspecified: Secondary | ICD-10-CM

## 2017-04-08 DIAGNOSIS — J3089 Other allergic rhinitis: Secondary | ICD-10-CM | POA: Diagnosis not present

## 2017-04-08 DIAGNOSIS — F411 Generalized anxiety disorder: Secondary | ICD-10-CM

## 2017-04-08 DIAGNOSIS — G121 Other inherited spinal muscular atrophy: Secondary | ICD-10-CM

## 2017-04-08 DIAGNOSIS — J4540 Moderate persistent asthma, uncomplicated: Secondary | ICD-10-CM

## 2017-04-08 NOTE — Patient Instructions (Addendum)
F/u in mid December , call if youn need me sooner  Need to return for flu vaccine, none available   Thankful you are doing better  Enjoy your cousin and school  A;ll the best  Keep in touch!

## 2017-04-11 NOTE — Assessment & Plan Note (Signed)
  Patient re-educated about  the importance of commitment to a  minimum of 150 minutes of exercise per week.  The importance of healthy food choices with portion control discussed. Encouraged to start a food diary, count calories and to consider  joining a support group. Sample diet sheets offered. Goals set by the patient for the next several months.   Weight /BMI 04/08/2017 01/20/2017 01/14/2017  WEIGHT - - -  HEIGHT - 5\' 7"  5\' 7"   BMI - - -

## 2017-04-11 NOTE — Progress Notes (Signed)
   Janet Acevedo     MRN: [id number]      DOB: [birth year]   HPI Janet Acevedo is here for follow up and re-evaluation of chronic medical conditions, medication management and review of any available recent lab and radiology data.  Preventive health is updated, specifically  Immunization.  She will need to return for her flu vaccine. States that her anxiety has improved  Since her Aunt's health is better, however she is aware that she is ill , she does plan to go to a counselor at some time. Is not taking any medication and does not wish to    ROS Denies recent fever or chills. Denies sinus pressure, nasal congestion, ear pain or sore throat. Denies chest congestion, productive cough or wheezing. Denies chest pains, palpitations and leg swelling Denies abdominal pain, nausea, vomiting,diarrhea or constipation.   Denies dysuria, frequency, hesitancy or incontinence. Denies joint pain, swelling and limitation in mobility. Denies headaches, seizures, numbness, or tingling. Denies depression, uncontrolled  anxiety or insomnia. Denies skin break down or rash.   PE  BP 128/82   Pulse 94   Resp 16   SpO2 99%   Patient alert and oriented and in no cardiopulmonary distress.  HEENT: No facial asymmetry, EOMI,   oropharynx pink and moist.  Neck decreased ROMe no JVD, no mass.  Chest: Clear to auscultation bilaterally.Decreased air entry  CVS: S1, S2 no murmurs, no S3.Regular rate.  ABD: Soft non tender.   Ext: No edema  VO:[id number]  ROM spine, shoulders, hips and knees.  Skin: Intact, no ulcerations or rash noted.  Psych: Good eye contact, normal affect. Memory intact not anxious or depressed appearing.  CNS: CN 2-12 intact, quadreplegic  Assessment & Plan GAD (generalized anxiety disorder) Improved, not on  Medication, will get counseling when able  Seasonal allergies Controlled, no change in medication   Type II spinal muscular atrophy ([facility]) Uncharged, dependent on  mobility equipment and assistance with ADL  Moderate persistent asthma Controlled, no change in medication   Dyslipidemia Hyperlipidemia:Low fat diet discussed and encouraged.   Lipid Panel  Lab Results  Component Value Date   CHOL 172 02/08/2017   HDL 33 (L) 02/08/2017   LDLCALC 127 (H) 02/08/2017   TRIG 62 02/08/2017   CHOLHDL 5.2 02/08/2017       Morbid obesity  Patient re-educated about  the importance of commitment to a  minimum of 150 minutes of exercise per week.  The importance of healthy food choices with portion control discussed. Encouraged to start a food diary, count calories and to consider  joining a support group. Sample diet sheets offered. Goals set by the patient for the next several months.   Weight /BMI 04/08/2017 01/20/2017 01/14/2017  WEIGHT - - -  HEIGHT - 5\' 7"  5\' 7"   BMI - - -

## 2017-04-11 NOTE — Assessment & Plan Note (Signed)
Uncharged, dependent on mobility equipment and assistance with ADL

## 2017-04-11 NOTE — Assessment & Plan Note (Signed)
Hyperlipidemia:Low fat diet discussed and encouraged.   Lipid Panel  Lab Results  Component Value Date   CHOL 172 02/08/2017   HDL 33 (L) 02/08/2017   LDLCALC 127 (H) 02/08/2017   TRIG 62 02/08/2017   CHOLHDL 5.2 02/08/2017

## 2017-04-11 NOTE — Assessment & Plan Note (Signed)
Controlled, no change in medication  

## 2017-04-11 NOTE — Assessment & Plan Note (Signed)
Improved, not on  Medication, will get counseling when able

## 2017-04-22 ENCOUNTER — Other Ambulatory Visit: Payer: Self-pay | Admitting: Family Medicine

## 2017-05-03 ENCOUNTER — Ambulatory Visit (INDEPENDENT_AMBULATORY_CARE_PROVIDER_SITE_OTHER): Payer: Medicare Other

## 2017-05-03 DIAGNOSIS — Z23 Encounter for immunization: Secondary | ICD-10-CM | POA: Diagnosis not present

## 2017-05-28 ENCOUNTER — Other Ambulatory Visit: Payer: Self-pay | Admitting: Family Medicine

## 2017-06-05 ENCOUNTER — Other Ambulatory Visit: Payer: Self-pay | Admitting: Family Medicine

## 2017-06-08 ENCOUNTER — Other Ambulatory Visit: Payer: Self-pay

## 2017-06-08 MED ORDER — CROMOLYN SODIUM 4 % OP SOLN: OPHTHALMIC | 3 refills | Status: DC

## 2017-06-22 ENCOUNTER — Encounter: Payer: Self-pay | Admitting: Family Medicine

## 2017-06-22 ENCOUNTER — Other Ambulatory Visit: Payer: Self-pay | Admitting: Family Medicine

## 2017-06-22 MED ORDER — CEPHALEXIN 500 MG PO CAPS: 500.0000 mg | ORAL_CAPSULE | Freq: Three times a day (TID) | ORAL | 0 refills | Status: DC

## 2017-06-22 MED ORDER — FLUCONAZOLE 150 MG PO TABS: 150.0000 mg | ORAL_TABLET | Freq: Once | ORAL | 0 refills | Status: AC

## 2017-06-28 ENCOUNTER — Other Ambulatory Visit: Payer: Self-pay

## 2017-06-28 MED ORDER — MONTELUKAST SODIUM 10 MG PO TABS: 10.0000 mg | ORAL_TABLET | Freq: Every day | ORAL | 1 refills | Status: DC

## 2017-07-12 ENCOUNTER — Ambulatory Visit: Payer: Medicare Other

## 2017-07-12 VITALS — BP 122/78 | HR 80 | Temp 97.7°F | Resp 16

## 2017-07-12 DIAGNOSIS — Z0000 Encounter for general adult medical examination without abnormal findings: Secondary | ICD-10-CM

## 2017-07-12 NOTE — Patient Instructions (Addendum)
Preventive Care for Adults  A healthy lifestyle and preventive care can promote health and wellness. Preventive health guidelines for adults include the following key practices.  . A routine yearly physical is a good way to check with your health care provider about your health and preventive screening. It is a chance to share any concerns and updates on your health and to receive a thorough exam.  . Visit your dentist for a routine exam and preventive care every 6 months. Brush your teeth twice a day and floss once a day. Good oral hygiene prevents tooth decay and gum disease.  . The frequency of eye exams is based on your age, health, family medical history, use  of contact lenses, and other factors. Follow your health care provider's ecommendations for frequency of eye exams.  . Eat a healthy diet. Foods like vegetables, fruits, whole grains, low-fat dairy products, and lean protein foods contain the nutrients you need without too many calories. Decrease your intake of foods high in solid fats, added sugars, and salt. Eat the right amount of calories for you. Get information about a proper diet from your health care provider, if necessary.  . Regular physical exercise is one of the most important things you can do for your health. Most adults should get at least 150 minutes of moderate-intensity exercise (any activity that increases your heart rate and causes you to sweat) each week. In addition, most adults need muscle-strengthening exercises on 2 or more days a week.  Silver Sneakers may be a benefit available to you. To determine eligibility, you may visit the website: www.silversneakers.com or contact program at (539)675-7648 Mon-Fri between 8AM-8PM.   . Maintain a healthy weight. The body mass index (BMI) is a screening tool to identify possible weight problems. It provides an estimate of body fat based on height and weight. Your health care provider can find your BMI and can help you  achieve or maintain a healthy weight.   For adults 20 years and older:

## 2017-07-12 NOTE — Progress Notes (Signed)
Subjective:   Janet Acevedo is a 26 y.o. female who presents for Medicare Annual (Subsequent) preventive examination.  Review of Systems:         Objective:     Vitals: BP 122/78 (BP Location: Left Arm, Patient Position: Sitting, Cuff Size: Normal)   Pulse 80   Temp 97.7 F (36.5 C) (Temporal)   Resp 16   SpO2 100%   There is no height or weight on file to calculate BMI.  Advanced Directives 01/12/2017  Does Patient Have a Medical Advance Directive? No    Tobacco Social History   Tobacco Use  Smoking Status Never Smoker  Smokeless Tobacco Never Used     Counseling given: Not Answered   Clinical Intake:     Pain : No/denies pain Pain Score: 0-No pain     Diabetes: No  How often do you need to have someone help you when you read instructions, pamphlets, or other written materials from your doctor or pharmacy?: 1 - Never What is the last grade level you completed in school?: associates  Interpreter Needed?: No     Past Medical History:  Diagnosis Date  . Allergy   . Anxiety   . Asthma   . GERD (gastroesophageal reflux disease)   . Neuromuscular disorder ([place])   . Osteomyelitis [facility]) June 2011  . Perennial allergic rhinitis   . Seborrheic dermatitis of scalp 2006  . Spinal muscle atrophy ([place])    type 2/3 18 months   Past Surgical History:  Procedure Laterality Date  . bilateral hip reinsertion  under age 67   hips out of socket, pt was only able to cruise hold on and move short distances   . BONE BIOPSY     spinal bone   . MIRENA PLACED 10/27/10    . SPINAL FUSION  1999   rods placed in thoracolumbar spine to excessive scoliosis primarily    Family History  Problem Relation Age of Onset  . Hyperlipidemia Mother   . Hypertension Mother   . Hypertension Brother   . Diabetes Brother        borderline    Social History   Socioeconomic History  . Marital status: Single    Spouse name: None  . Number of children: None  . Years of  education: None  . Highest education level: Associate degree: academic program  Social Needs  . Financial resource strain: Not hard at all  . Food insecurity - worry: Never true  . Food insecurity - inability: Never true  . Transportation needs - medical: No  . Transportation needs - non-medical: No  Occupational History  . Occupation: Ship broker  Tobacco Use  . Smoking status: Never Smoker  . Smokeless tobacco: Never Used  Substance and Sexual Activity  . Alcohol use: No  . Drug use: No  . Sexual activity: No  Other Topics Concern  . None  Social History Narrative  . None    Outpatient Encounter Medications as of 07/12/2017  Medication Sig  . albuterol (ACCUNEB) 1.25 MG/3ML nebulizer solution Take 1.25 mg by nebulization every 4 (four) hours as needed.  . betamethasone dipropionate (DIPROLENE) 0.05 % cream APPLY TOPICALLY 2 (TWO) TIMES DAILY.  [name] Biotin (BIOTIN MAXIMUM STRENGTH) 10 MG TABS Take 1 tablet by mouth daily.  . cromolyn (OPTICROM) 4 % ophthalmic solution PLACE 1 DROP INTO BOTH EYES FOUR TIMES DAILY  . Fluocinolone Acetonide Scalp 0.01 % OIL Apply 1 application topically as needed.  [name] ketoconazole (  NIZORAL) 2 % cream Apply 1 application topically as needed. dermatitis  . levalbuterol (XOPENEX) 1.25 MG/3ML nebulizer solution Take 1.25 mg by nebulization every 6 (six) hours as needed for wheezing.  [name] levocetirizine (XYZAL) 5 MG tablet TAKE 1 TABLET (5 MG TOTAL) BY MOUTH EVERY EVENING.  . montelukast (SINGULAIR) 10 MG tablet Take 1 tablet (10 mg total) by mouth daily.  . Multiple Vitamin (MULTIVITAMIN) tablet Take 1 tablet by mouth daily.    [name] nystatin (MYCOSTATIN/NYSTOP) powder APPLY TWICE DAILY TO AFFECTED AREA AS NEEDED  . omeprazole (PRILOSEC) 20 MG capsule TAKE 1 CAPSULE EVERY DAY  . omeprazole (PRILOSEC) 20 MG capsule TAKE 1 CAPSULE EVERY DAY  . Spacer/Aero-Holding Chambers (AEROCHAMBER PLUS) inhaler Use as instructed  . SYMBICORT 160-4.5 MCG/ACT inhaler TAKE 2 PUFFS  BY MOUTH TWICE A DAY  . UNABLE TO FIND Gloves- 1 box per month as needed  . UNABLE TO FIND Under pads- for use daily as needed  . VENTOLIN HFA 108 (90 Base) MCG/ACT inhaler INHALE 2 PUFFS INTO THE LUNGS EVERY 4 (FOUR) HOURS AS NEEDED FOR WHEEZING OR SHORTNESS OF BREATH.  . [DISCONTINUED] azelastine (ASTELIN) 0.1 % nasal spray PLACE 2 SPRAYS INTO BOTH NOSTRILS 2 (TWO) TIMES DAILY. USE IN EACH NOSTRIL AS DIRECTED (Patient not taking: Reported on 07/12/2017)  . [DISCONTINUED] cephALEXin (KEFLEX) 500 MG capsule Take 1 capsule (500 mg total) by mouth 3 (three) times daily. (Patient not taking: Reported on 07/12/2017)   No facility-administered encounter medications on file as of 07/12/2017.     Activities of Daily Living In your present state of health, do you have any difficulty performing the following activities: 07/12/2017  Hearing? N  Vision? N  Difficulty concentrating or making decisions? N  Walking or climbing stairs? Y  Dressing or bathing? Y  Preparing Food and eating ? Y  Using the Toilet? Y  In the past six months, have you accidently leaked urine? Y  Do you have problems with loss of bowel control? Y  Managing your Medications? Y  Managing your Finances? N  Housekeeping or managing your Housekeeping? Y  Some recent data might be hidden    Patient Care Team: Fayrene Helper, MD as PCP - General (Family Medicine) Aretha Parrot Donnella Sham, MD as Referring Physician (Pulmonary Disease)    Assessment:   This is a routine wellness examination for Janet Acevedo.  Exercise Activities and Dietary recommendations Exercise limited by: orthopedic condition(s)  Goals    None      Fall Risk Fall Risk  07/12/2017 04/08/2017 04/09/2015  Falls in the past year? No No No   Is the patient's home free of loose throw rugs in walkways, pet beds, electrical cords, etc?   yes      Grab bars in the bathroom? no      Handrails on the stairs?   no      Adequate lighting?   yes  Timed Get Up  and Go performed: n/a  Depression Screen PHQ 2/9 Scores 07/12/2017 04/08/2017  PHQ - 2 Score 0 0     Cognitive Function     6CIT Screen 07/12/2017  What Year? 0 points  What month? 0 points  What time? 0 points  Count back from 20 0 points  Months in reverse 0 points  Repeat phrase 0 points  Total Score 0    Immunization History  Administered Date(s) Administered  . Influenza Split 04/07/2012  . Influenza Whole 04/07/2010, 04/03/2011  . Influenza,inj,Quad PF,6+ Mos 05/10/2013, 07/12/2014,  04/09/2015, 05/05/2016, 05/03/2017  . Pneumococcal Conjugate-13 01/20/2017    Qualifies for Shingles Vaccine?n/a  Screening Tests Health Maintenance  Topic Date Due  . PAP SMEAR  12/19/2012  . HIV Screening  07/12/2018 (Originally 12/20/2006)  . TETANUS/TDAP  07/27/2025  . INFLUENZA VACCINE  Completed    Cancer Screenings: Lung: Low Dose CT Chest recommended if Age 27-80 years, 30 pack-year currently smoking OR have quit w/in 15years. Patient does not qualify. Breast:  Up to date on Mammogram? n/a Up to date of Bone Density/Dexa? n/a Colorectal: n/a  Additional Screenings:  Hepatitis B/HIV/Syphillis: Hepatitis C Screening:      Plan:      I have personally reviewed and noted the following in the patient's chart:   . Medical and social history . Use of alcohol, tobacco or illicit drugs  . Current medications and supplements . Functional ability and status . Nutritional status . Physical activity . Advanced directives . List of other physicians . Hospitalizations, surgeries, and ER visits in previous 12 months . Vitals . Screenings to include cognitive, depression, and falls . Referrals and appointments  In addition, I have reviewed and discussed with patient certain preventive protocols, quality metrics, and best practice recommendations. A written personalized care plan for preventive services as well as general preventive health recommendations were provided to  patient.     Ova Freshwater, LPN  [id number]

## 2017-07-15 ENCOUNTER — Ambulatory Visit: Payer: Medicare Other | Admitting: Family Medicine

## 2017-07-30 DIAGNOSIS — R070 Pain in throat: Secondary | ICD-10-CM | POA: Diagnosis not present

## 2017-07-30 DIAGNOSIS — M542 Cervicalgia: Secondary | ICD-10-CM | POA: Diagnosis not present

## 2017-07-30 DIAGNOSIS — R1310 Dysphagia, unspecified: Secondary | ICD-10-CM | POA: Diagnosis not present

## 2017-07-30 DIAGNOSIS — R630 Anorexia: Secondary | ICD-10-CM | POA: Diagnosis not present

## 2017-07-30 DIAGNOSIS — J029 Acute pharyngitis, unspecified: Secondary | ICD-10-CM | POA: Diagnosis not present

## 2017-07-31 DIAGNOSIS — R1310 Dysphagia, unspecified: Secondary | ICD-10-CM | POA: Diagnosis not present

## 2017-07-31 DIAGNOSIS — R070 Pain in throat: Secondary | ICD-10-CM | POA: Diagnosis not present

## 2017-08-06 DIAGNOSIS — K219 Gastro-esophageal reflux disease without esophagitis: Secondary | ICD-10-CM | POA: Diagnosis not present

## 2017-08-06 DIAGNOSIS — R1310 Dysphagia, unspecified: Secondary | ICD-10-CM | POA: Diagnosis not present

## 2017-08-17 ENCOUNTER — Encounter: Payer: Self-pay | Admitting: Family Medicine

## 2017-08-17 ENCOUNTER — Ambulatory Visit (INDEPENDENT_AMBULATORY_CARE_PROVIDER_SITE_OTHER): Payer: Medicare Other | Admitting: Family Medicine

## 2017-08-17 VITALS — BP 114/80 | HR 86 | Temp 99.0°F | Resp 16

## 2017-08-17 DIAGNOSIS — J302 Other seasonal allergic rhinitis: Secondary | ICD-10-CM | POA: Diagnosis not present

## 2017-08-17 DIAGNOSIS — K219 Gastro-esophageal reflux disease without esophagitis: Secondary | ICD-10-CM | POA: Diagnosis not present

## 2017-08-17 DIAGNOSIS — J0100 Acute maxillary sinusitis, unspecified: Secondary | ICD-10-CM

## 2017-08-17 DIAGNOSIS — J4540 Moderate persistent asthma, uncomplicated: Secondary | ICD-10-CM | POA: Diagnosis not present

## 2017-08-17 MED ORDER — ALBUTEROL SULFATE HFA 108 (90 BASE) MCG/ACT IN AERS: INHALATION_SPRAY | RESPIRATORY_TRACT | 5 refills | Status: DC

## 2017-08-17 MED ORDER — FLUCONAZOLE 150 MG PO TABS: ORAL_TABLET | ORAL | 0 refills | Status: DC

## 2017-08-17 MED ORDER — PENICILLIN V POTASSIUM 250 MG/5ML PO SOLR: ORAL | 0 refills | Status: DC

## 2017-08-17 MED ORDER — ALBUTEROL SULFATE 1.25 MG/3ML IN NEBU: 1.2500 mg | INHALATION_SOLUTION | RESPIRATORY_TRACT | 5 refills | Status: DC | PRN

## 2017-08-17 NOTE — Assessment & Plan Note (Signed)
Pen V liquid 500 mg 3 times daily for 10 days

## 2017-08-17 NOTE — Patient Instructions (Signed)
F/u as before, call if you need me sooner  You are treated for sinus infection  Ear exam shows no infection   Pressure in ears is referred from sinus pain  Penicillin liquid is prescribed for 10 days  Take entire course, also fluconazole is prescribed  School excuse to return next Monday

## 2017-08-17 NOTE — Assessment & Plan Note (Signed)
Significant dysphagia in recent times, scheduled for swallowing study in am at [facility] which has been cancelled because of acute illness

## 2017-08-17 NOTE — Assessment & Plan Note (Signed)
No current flare 

## 2017-08-17 NOTE — Assessment & Plan Note (Signed)
Current flare needs to continue use of medication daily

## 2017-08-17 NOTE — Progress Notes (Signed)
   Janet Acevedo     MRN: [id number]      DOB: 07-Nov-1990   HPI Janet Acevedo is here with a 4 day h/o bilateral ear pressure, right greater than left , sore throat , body aches , facial pressure , esp over the eyes and over left cheek and increased head pressure and congestion with clear nasal drainage. She has been coughing more than usual, she  has had no documented fever  ROS . Denies chest pains, palpitations and leg swelling Denies abdominal pain, nausea, vomiting,diarrhea or constipation.   Denies dysuria, frequency, hesitancy or incontinence.  Denies headaches, seizures, numbness, or tingling. Denies depression, anxiety or insomnia. Denies skin break down or rash.   PE  BP 114/80   Pulse 86   Temp 99 F (37.2 C) (Oral)   Resp 16   SpO2 98%   Patient alert and oriented and in no cardiopulmonary distress.Ill appearing   HEENT: No facial asymmetry, EOMI,   oropharynx pink and moist.  Neck decreased ROM no JVD, no mass.TM clear. Anterior cervical adenitis, left maxillary sinus tenderness Chest: Clear to auscultation bilaterally.Decreased air entry bilaterlly  CVS: S1, S2 no murmurs, no S3.Regular rate.  ABD: Soft non tender.   Ext: No edema  MS: decreased  ROM spine, shoulders, hips and knees.  Skin: Intact, no ulcerations or rash noted.  Psych: Good eye contact, normal affect. Memory intact not anxious or depressed appearing.  CNS: CN 2-12 intact,   Assessment & Plan  Maxillary sinusitis, acute Pen V liquid 500 mg 3 times daily for 10 days  Moderate persistent asthma No current flare  GERD Significant dysphagia in recent times, scheduled for swallowing study in am at [facility] which has been cancelled because of acute illness  Seasonal allergies Current flare needs to continue use of medication daily

## 2017-08-20 ENCOUNTER — Encounter: Payer: Self-pay | Admitting: Family Medicine

## 2017-08-20 ENCOUNTER — Telehealth: Payer: Self-pay | Admitting: Family Medicine

## 2017-08-20 MED ORDER — OMEPRAZOLE 2 MG/ML ORAL SUSPENSION: 20.0000 mg | Freq: Every day | ORAL | 0 refills | Status: DC

## 2017-08-20 MED ORDER — AMOXICILLIN-POT CLAVULANATE 250-62.5 MG/5ML PO SUSR: 500.0000 mg | Freq: Three times a day (TID) | ORAL | 0 refills | Status: AC

## 2017-08-20 NOTE — Telephone Encounter (Signed)
This encounter was open in response to my chart message from patient reporting that she was unable to swallow the penicillin for sinus infection due to the terrible taste.  I have sent in a medication for Augmentin to be taken as directed 3 times a day.  I have put a note for pharmacy to flavor this medication per patient request.  Please call patient and advise.  He would not let me send this medication electronically due to the length of the directions.  I believe this  prescription printed.  Please send to pharmacy.

## 2017-08-20 NOTE — Telephone Encounter (Signed)
I was not able to reach patient by phone, so a MyChart message was sent, and medication was faxed.

## 2017-08-20 NOTE — Telephone Encounter (Signed)
Please write a RX for Prilosec  In Liquid form

## 2017-08-20 NOTE — Telephone Encounter (Signed)
Patient is having difficulty swallowing the pills. She requests liquid be called to [facility]

## 2017-08-20 NOTE — Telephone Encounter (Signed)
Have pended a prescription for the Prilosec syrup.  It would not let me send this in electronically.  I have ordered his phone and.  Please advise patient that this is been called in.  Follow-up with PCP.

## 2017-08-20 NOTE — Telephone Encounter (Signed)
Could not reach patient. Sent MyChart message.

## 2017-08-20 NOTE — Telephone Encounter (Signed)
Please call in a RX for Liquid Prilosec

## 2017-08-21 ENCOUNTER — Telehealth: Payer: Self-pay | Admitting: Family Medicine

## 2017-08-21 DIAGNOSIS — R638 Other symptoms and signs concerning food and fluid intake: Secondary | ICD-10-CM | POA: Diagnosis not present

## 2017-08-21 DIAGNOSIS — R110 Nausea: Secondary | ICD-10-CM | POA: Diagnosis not present

## 2017-08-21 DIAGNOSIS — G121 Other inherited spinal muscular atrophy: Secondary | ICD-10-CM | POA: Diagnosis not present

## 2017-08-21 DIAGNOSIS — R1310 Dysphagia, unspecified: Secondary | ICD-10-CM | POA: Diagnosis not present

## 2017-08-21 DIAGNOSIS — K219 Gastro-esophageal reflux disease without esophagitis: Secondary | ICD-10-CM | POA: Diagnosis not present

## 2017-08-21 DIAGNOSIS — R251 Tremor, unspecified: Secondary | ICD-10-CM | POA: Diagnosis not present

## 2017-08-21 DIAGNOSIS — E876 Hypokalemia: Secondary | ICD-10-CM | POA: Diagnosis not present

## 2017-08-21 DIAGNOSIS — K9049 Malabsorption due to intolerance, not elsewhere classified: Secondary | ICD-10-CM | POA: Diagnosis not present

## 2017-08-21 DIAGNOSIS — G129 Spinal muscular atrophy, unspecified: Secondary | ICD-10-CM | POA: Diagnosis not present

## 2017-08-21 DIAGNOSIS — R5383 Other fatigue: Secondary | ICD-10-CM | POA: Diagnosis not present

## 2017-08-21 DIAGNOSIS — J329 Chronic sinusitis, unspecified: Secondary | ICD-10-CM | POA: Diagnosis not present

## 2017-08-21 DIAGNOSIS — R918 Other nonspecific abnormal finding of lung field: Secondary | ICD-10-CM | POA: Diagnosis not present

## 2017-08-21 DIAGNOSIS — J984 Other disorders of lung: Secondary | ICD-10-CM | POA: Diagnosis not present

## 2017-08-21 DIAGNOSIS — R0602 Shortness of breath: Secondary | ICD-10-CM | POA: Diagnosis not present

## 2017-08-21 DIAGNOSIS — E860 Dehydration: Secondary | ICD-10-CM | POA: Diagnosis not present

## 2017-08-21 DIAGNOSIS — R531 Weakness: Secondary | ICD-10-CM | POA: Diagnosis not present

## 2017-08-21 MED ORDER — LANSOPRAZOLE 30 MG PO TBDP: 30.0000 mg | ORAL_TABLET | Freq: Every day | ORAL | 0 refills | Status: DC

## 2017-08-21 MED ORDER — OMEPRAZOLE 2 MG/ML ORAL SUSPENSION: 20.0000 mg | Freq: Every day | ORAL | 0 refills | Status: DC

## 2017-08-21 NOTE — Telephone Encounter (Signed)
Patient called last night while I was on call. She reports that her medication was not at the pharmacy. Requesting it to be called in.    Called pharmacy. No liquid available, has to be done at compounding pharmacy. Will order solutabs. Gwen Her. Mannie Stabile, MD

## 2017-08-22 DIAGNOSIS — K317 Polyp of stomach and duodenum: Secondary | ICD-10-CM | POA: Diagnosis not present

## 2017-08-22 DIAGNOSIS — F419 Anxiety disorder, unspecified: Secondary | ICD-10-CM | POA: Diagnosis not present

## 2017-08-22 DIAGNOSIS — K200 Eosinophilic esophagitis: Secondary | ICD-10-CM | POA: Diagnosis not present

## 2017-08-22 DIAGNOSIS — E876 Hypokalemia: Secondary | ICD-10-CM | POA: Diagnosis not present

## 2017-08-22 DIAGNOSIS — E860 Dehydration: Secondary | ICD-10-CM | POA: Diagnosis not present

## 2017-08-22 DIAGNOSIS — R9431 Abnormal electrocardiogram [ECG] [EKG]: Secondary | ICD-10-CM | POA: Diagnosis not present

## 2017-08-22 DIAGNOSIS — R634 Abnormal weight loss: Secondary | ICD-10-CM | POA: Diagnosis not present

## 2017-08-22 DIAGNOSIS — R1310 Dysphagia, unspecified: Secondary | ICD-10-CM | POA: Diagnosis present

## 2017-08-22 DIAGNOSIS — J329 Chronic sinusitis, unspecified: Secondary | ICD-10-CM | POA: Diagnosis not present

## 2017-08-22 DIAGNOSIS — I517 Cardiomegaly: Secondary | ICD-10-CM | POA: Diagnosis present

## 2017-08-22 DIAGNOSIS — J45909 Unspecified asthma, uncomplicated: Secondary | ICD-10-CM | POA: Diagnosis present

## 2017-08-22 DIAGNOSIS — K219 Gastro-esophageal reflux disease without esophagitis: Secondary | ICD-10-CM | POA: Diagnosis present

## 2017-08-22 DIAGNOSIS — R638 Other symptoms and signs concerning food and fluid intake: Secondary | ICD-10-CM | POA: Diagnosis not present

## 2017-08-22 DIAGNOSIS — R112 Nausea with vomiting, unspecified: Secondary | ICD-10-CM | POA: Diagnosis not present

## 2017-08-22 DIAGNOSIS — G129 Spinal muscular atrophy, unspecified: Secondary | ICD-10-CM | POA: Diagnosis not present

## 2017-08-22 DIAGNOSIS — R633 Feeding difficulties: Secondary | ICD-10-CM | POA: Diagnosis not present

## 2017-08-22 DIAGNOSIS — K9049 Malabsorption due to intolerance, not elsewhere classified: Secondary | ICD-10-CM | POA: Diagnosis present

## 2017-08-22 DIAGNOSIS — G121 Other inherited spinal muscular atrophy: Secondary | ICD-10-CM | POA: Diagnosis not present

## 2017-08-22 DIAGNOSIS — M419 Scoliosis, unspecified: Secondary | ICD-10-CM | POA: Diagnosis not present

## 2017-08-22 DIAGNOSIS — J984 Other disorders of lung: Secondary | ICD-10-CM | POA: Diagnosis not present

## 2017-08-24 DIAGNOSIS — R1310 Dysphagia, unspecified: Secondary | ICD-10-CM | POA: Insufficient documentation

## 2017-08-30 ENCOUNTER — Other Ambulatory Visit: Payer: Self-pay

## 2017-08-30 MED ORDER — LEVOCETIRIZINE DIHYDROCHLORIDE 5 MG PO TABS: ORAL_TABLET | ORAL | 3 refills | Status: DC

## 2017-08-30 MED ORDER — CROMOLYN SODIUM 4 % OP SOLN: OPHTHALMIC | 11 refills | Status: DC

## 2017-09-02 ENCOUNTER — Encounter: Payer: Self-pay | Admitting: Family Medicine

## 2017-09-02 ENCOUNTER — Ambulatory Visit (INDEPENDENT_AMBULATORY_CARE_PROVIDER_SITE_OTHER): Payer: Medicare Other | Admitting: Family Medicine

## 2017-09-02 ENCOUNTER — Ambulatory Visit: Payer: Medicare Other | Admitting: Family Medicine

## 2017-09-02 VITALS — BP 118/84 | HR 91 | Resp 16 | Ht 66.0 in | Wt 152.0 lb

## 2017-09-02 DIAGNOSIS — F411 Generalized anxiety disorder: Secondary | ICD-10-CM

## 2017-09-02 DIAGNOSIS — Z09 Encounter for follow-up examination after completed treatment for conditions other than malignant neoplasm: Secondary | ICD-10-CM | POA: Diagnosis not present

## 2017-09-02 DIAGNOSIS — K219 Gastro-esophageal reflux disease without esophagitis: Secondary | ICD-10-CM | POA: Diagnosis not present

## 2017-09-02 DIAGNOSIS — G121 Other inherited spinal muscular atrophy: Secondary | ICD-10-CM

## 2017-09-02 DIAGNOSIS — F321 Major depressive disorder, single episode, moderate: Secondary | ICD-10-CM | POA: Diagnosis not present

## 2017-09-02 MED ORDER — ALPRAZOLAM 0.25 MG PO TABS
0.2500 mg | ORAL_TABLET | Freq: Two times a day (BID) | ORAL | 1 refills | Status: DC | PRN
Start: 2017-09-02 — End: 2018-10-06

## 2017-09-02 NOTE — Patient Instructions (Addendum)
F/U in 4 months, call if you need me before  You are being referred to psychiatry and psychology for anxiety and depression  YOU HAVE BEN SENT IN LOW DOSE OF XANAX TO USE SPARINGLY SHORT TERM TO HELP WITH YOUR ANXIETY UNTIL YOU GE TO PSYCHIATRY

## 2017-09-03 DIAGNOSIS — K219 Gastro-esophageal reflux disease without esophagitis: Secondary | ICD-10-CM | POA: Diagnosis not present

## 2017-09-03 DIAGNOSIS — R1310 Dysphagia, unspecified: Secondary | ICD-10-CM | POA: Diagnosis not present

## 2017-09-05 ENCOUNTER — Telehealth: Payer: Self-pay | Admitting: Family Medicine

## 2017-09-05 ENCOUNTER — Encounter: Payer: Self-pay | Admitting: Family Medicine

## 2017-09-05 DIAGNOSIS — F321 Major depressive disorder, single episode, moderate: Secondary | ICD-10-CM

## 2017-09-05 HISTORY — DX: Major depressive disorder, single episode, moderate: F32.1

## 2017-09-05 NOTE — Assessment & Plan Note (Signed)
Quadriparesis and incapable of independent living

## 2017-09-05 NOTE — Assessment & Plan Note (Signed)
Reports recent npatioent stay for dysphagia which was emotionally traumatic and has left her with increased severe anxiety which is disabling to the extent that she is withdrawing from school this semester. She is also c/o depression. Of note she was referred to psychiatry in Summer 2018 with severe anxiety but never received  Call She has multiple personal and psychosocial stresses in her life which she has been overcoming over the years, ot the least of which is her physical debility from muscular atrophy which makes her wheelchair dependent Her hospitalization was fo dysphagia which she states has improved with higher dose PPI, still has upper endo scheduled

## 2017-09-05 NOTE — Telephone Encounter (Signed)
Pls send urgent message for appt for this pt to psychiatry./ try and follow through as best able  She was referred in Summer 2018, reports never receiving a call. 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   THANKS

## 2017-09-05 NOTE — Progress Notes (Signed)
   Janet Acevedo     MRN: [id number]      DOB: Aug 08, 1990   HPI Ms. Hollinger is here for follow up from [facility] where she as from 002/04 to 02/05, because of dysphagia. She had a procedure scheduled later for the problem but due to increased difficulty she went to the hospital sooner States she o is not currently having difficulty or pain swallowing Mother and Maniya  state that sinc this recent hospitaization and all of the difficulty with swallowing, her anxiety has worsened, Gwendola says that her hands shake all the time, and her mother says that now Shamarie wants her with her constantly. She feels as though Leahanna is unable to return to school this semester and needs health issues addressed especially mental health, Shawndrea also agees ROS Denies recent fever or chills. Denies sinus pressure, nasal congestion, ear pain or sore throat. Denies chest congestion, productive cough or wheezing. Denies chest pains, palpitations and leg swelling Denies abdominal pain, nausea, vomiting,diarrhea or constipation.   Denies dysuria, frequency, hesitancy or incontinence. Denies skin break down or rash.   PE  BP 118/84   Pulse 91   Resp 16   Ht 5\' 6"  (1.676 m)   Wt 152 lb (68.9 kg)   SpO2 97%   BMI 24.53 kg/m   Patient alert and oriented and in no cardiopulmonary distress.  HEENT: No facial asymmetry, EOMI,   oropharynx pink and moist.  Neck supple no JVD, no mass.  Chest: Clear to auscultation bilaterally.  CVS: S1, S2 no murmurs, no S3.Regular rate.  ABD: Soft non tender.   Ext: No edema  MS: decreased  ROM spine, shoulders, hips and knees.  Skin: Intact, no ulcerations or rash noted.  Psych: Good eye contact, flat affect. Memory intact  anxious and mildly  depressed appearing.  CNS: CN 2-12 intact, power,  normal throughout.no focal deficits noted.   Assessment & Plan Depression, major, single episode, moderate ([place]) Refer to psych asap. Excuse from school. Hold on SSRI,  has tried in the past and not tolerated well. Plans to withdraw from school this semester  GAD (generalized anxiety disorder) Start low dose xanax and refer to mental health for asap appt . School excuse and pt to withdraw from senmmester. Symptoms have worsened significantly following recent hospitalization  Type II spinal muscular atrophy ([place]) Quadriparesis and incapable of independent living   GERD Denies dysphagia on increased PPI dose, still has upper endo with dilation planned  Hospital discharge follow-up Reports recent npatioent stay for dysphagia which was emotionally traumatic and has left her with increased severe anxiety which is disabling to the extent that she is withdrawing from school this semester. She is also c/o depression. Of note she was referred to psychiatry in Summer 2018 with severe anxiety but never received  Call She has multiple personal and psychosocial stresses in her life which she has been overcoming over the years, ot the least of which is her physical debility from muscular atrophy which makes her wheelchair dependent Her hospitalization was fo dysphagia which she states has improved with higher dose PPI, still has upper endo scheduled

## 2017-09-05 NOTE — Assessment & Plan Note (Signed)
Start low dose xanax and refer to mental health for asap appt . School excuse and pt to withdraw from senmmester. Symptoms have worsened significantly following recent hospitalization

## 2017-09-05 NOTE — Assessment & Plan Note (Signed)
Refer to psych asap. Excuse from school. Hold on SSRI, has tried in the past and not tolerated well. Plans to withdraw from school this semester

## 2017-09-05 NOTE — Assessment & Plan Note (Signed)
Denies dysphagia on increased PPI dose, still has upper endo with dilation planned

## 2017-09-06 ENCOUNTER — Encounter: Payer: Self-pay | Admitting: Family Medicine

## 2017-09-06 ENCOUNTER — Telehealth (HOSPITAL_COMMUNITY): Payer: Self-pay

## 2017-09-06 NOTE — Telephone Encounter (Signed)
Please see note from [place]

## 2017-09-06 NOTE — Telephone Encounter (Signed)
appt set on 10-11-2016 at 2pm.  Informed patient to come at 1pm in order to complete intake paperwork.

## 2017-09-06 NOTE — Telephone Encounter (Signed)
noted 

## 2017-09-16 ENCOUNTER — Encounter: Payer: Self-pay | Admitting: Family Medicine

## 2017-09-16 NOTE — Telephone Encounter (Signed)
Can you call this patient back regarding her appt. She sent a mychart message concerned that her appt time was 2:00 instead of 1:00. She said she needed a 1:00 appt. Thanks

## 2017-09-20 ENCOUNTER — Encounter: Payer: Self-pay | Admitting: Family Medicine

## 2017-09-21 ENCOUNTER — Encounter: Payer: Self-pay | Admitting: Family Medicine

## 2017-09-21 ENCOUNTER — Other Ambulatory Visit: Payer: Self-pay | Admitting: Family Medicine

## 2017-09-21 DIAGNOSIS — J302 Other seasonal allergic rhinitis: Secondary | ICD-10-CM

## 2017-09-21 MED ORDER — PREDNISONE 5 MG (21) PO TBPK: 5.0000 mg | ORAL_TABLET | ORAL | 0 refills | Status: DC

## 2017-09-21 MED ORDER — AZITHROMYCIN 250 MG PO TABS: ORAL_TABLET | ORAL | 0 refills | Status: DC

## 2017-09-21 MED ORDER — FLUCONAZOLE 150 MG PO TABS: ORAL_TABLET | ORAL | 0 refills | Status: DC

## 2017-09-27 DIAGNOSIS — J4550 Severe persistent asthma, uncomplicated: Secondary | ICD-10-CM | POA: Diagnosis not present

## 2017-09-27 DIAGNOSIS — J45909 Unspecified asthma, uncomplicated: Secondary | ICD-10-CM | POA: Diagnosis not present

## 2017-09-27 DIAGNOSIS — Z79899 Other long term (current) drug therapy: Secondary | ICD-10-CM | POA: Diagnosis not present

## 2017-09-27 DIAGNOSIS — G121 Other inherited spinal muscular atrophy: Secondary | ICD-10-CM | POA: Diagnosis not present

## 2017-09-27 DIAGNOSIS — Z7951 Long term (current) use of inhaled steroids: Secondary | ICD-10-CM | POA: Diagnosis not present

## 2017-09-27 DIAGNOSIS — Z881 Allergy status to other antibiotic agents status: Secondary | ICD-10-CM | POA: Diagnosis not present

## 2017-09-29 ENCOUNTER — Telehealth: Payer: Self-pay | Admitting: Family Medicine

## 2017-09-29 NOTE — Telephone Encounter (Signed)
Spoke with [name] and gave her the number to inquire about the appt time discrepancy. Also advised her letter was ready to collect

## 2017-09-29 NOTE — Telephone Encounter (Signed)
Please call patient as a follow up on h the e mail she sent to thwe rMS pool and follow through , thanks, I have documented whati am asking you to do and sent her a message

## 2017-10-06 ENCOUNTER — Telehealth: Payer: Self-pay | Admitting: Family Medicine

## 2017-10-06 NOTE — Telephone Encounter (Signed)
Patients cousin Chrisandra Netters picked up patients letter, I obtained permission from patient by phone.
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
## 2017-10-07 ENCOUNTER — Telehealth: Payer: Self-pay | Admitting: Family Medicine

## 2017-10-07 ENCOUNTER — Other Ambulatory Visit: Payer: Self-pay

## 2017-10-07 MED ORDER — KETOCONAZOLE 2 % EX CREA: 1.0000 "application " | TOPICAL_CREAM | CUTANEOUS | 3 refills | Status: DC | PRN

## 2017-10-07 NOTE — Telephone Encounter (Signed)
Med called in

## 2017-10-07 NOTE — Telephone Encounter (Signed)
Patient called in to request ketoconazole cream 2% for redness/rash around ears, face and forehead. Cb#: [phone] [facility] [place]

## 2017-10-11 ENCOUNTER — Ambulatory Visit (INDEPENDENT_AMBULATORY_CARE_PROVIDER_SITE_OTHER): Payer: Medicare Other | Admitting: Psychiatry

## 2017-10-11 ENCOUNTER — Encounter (HOSPITAL_COMMUNITY): Payer: Self-pay | Admitting: Psychiatry

## 2017-10-11 VITALS — BP 112/86 | HR 96

## 2017-10-11 DIAGNOSIS — Z813 Family history of other psychoactive substance abuse and dependence: Secondary | ICD-10-CM | POA: Diagnosis not present

## 2017-10-11 DIAGNOSIS — F419 Anxiety disorder, unspecified: Secondary | ICD-10-CM

## 2017-10-11 DIAGNOSIS — Z91411 Personal history of adult psychological abuse: Secondary | ICD-10-CM

## 2017-10-11 DIAGNOSIS — G4700 Insomnia, unspecified: Secondary | ICD-10-CM

## 2017-10-11 DIAGNOSIS — R450 Nervousness: Secondary | ICD-10-CM | POA: Diagnosis not present

## 2017-10-11 DIAGNOSIS — Z818 Family history of other mental and behavioral disorders: Secondary | ICD-10-CM | POA: Diagnosis not present

## 2017-10-11 DIAGNOSIS — Z630 Problems in relationship with spouse or partner: Secondary | ICD-10-CM | POA: Diagnosis not present

## 2017-10-11 MED ORDER — SERTRALINE HCL 50 MG PO TABS: ORAL_TABLET | ORAL | 1 refills | Status: DC

## 2017-10-11 NOTE — Patient Instructions (Signed)
1. Start sertraline 25 mg daily for two week, then 50 mg daily  2. Continue Xanax 0.25 mg as needed for anxiety  3. Return to clinic in six weeks for 30 mins 4. Referral to therapy

## 2017-10-18 ENCOUNTER — Other Ambulatory Visit: Payer: Self-pay | Admitting: Family Medicine

## 2017-11-03 ENCOUNTER — Other Ambulatory Visit (HOSPITAL_COMMUNITY): Payer: Self-pay | Admitting: Psychiatry

## 2017-11-03 MED ORDER — SERTRALINE HCL 50 MG PO TABS: 50.0000 mg | ORAL_TABLET | Freq: Every day | ORAL | 0 refills | Status: DC

## 2017-11-04 ENCOUNTER — Telehealth: Payer: Self-pay | Admitting: Family Medicine

## 2017-11-04 NOTE — Telephone Encounter (Signed)
Please call Horris Latino [phone] regarding repairs for scooter

## 2017-11-05 NOTE — Telephone Encounter (Signed)
I received the request for scooter repairs and dr simpson will be back next week to sign.

## 2017-11-15 DIAGNOSIS — J4540 Moderate persistent asthma, uncomplicated: Secondary | ICD-10-CM | POA: Diagnosis not present

## 2017-11-18 ENCOUNTER — Other Ambulatory Visit: Payer: Self-pay | Admitting: Family Medicine
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
## 2017-11-24 ENCOUNTER — Telehealth: Payer: Self-pay | Admitting: Family Medicine

## 2017-11-24 NOTE — Telephone Encounter (Signed)
Needs a new wheelchair. Will contact [employer] (contact Darliss Ridgel) [phone]

## 2017-11-24 NOTE — Telephone Encounter (Signed)
Pt is calling in -as she needs a new wheelchair.

## 2017-11-25 ENCOUNTER — Ambulatory Visit (INDEPENDENT_AMBULATORY_CARE_PROVIDER_SITE_OTHER): Payer: Medicare Other | Admitting: Psychiatry

## 2017-11-25 ENCOUNTER — Encounter (HOSPITAL_COMMUNITY): Payer: Self-pay | Admitting: Psychiatry

## 2017-11-25 VITALS — HR 90 | Ht 66.0 in | Wt 152.0 lb

## 2017-11-25 DIAGNOSIS — F419 Anxiety disorder, unspecified: Secondary | ICD-10-CM

## 2017-11-25 DIAGNOSIS — R450 Nervousness: Secondary | ICD-10-CM | POA: Diagnosis not present

## 2017-11-25 DIAGNOSIS — Z818 Family history of other mental and behavioral disorders: Secondary | ICD-10-CM

## 2017-11-25 DIAGNOSIS — Z813 Family history of other psychoactive substance abuse and dependence: Secondary | ICD-10-CM | POA: Diagnosis not present

## 2017-11-25 NOTE — Patient Instructions (Signed)
1. Start sertraline 25 mg daily for two week, then 50 mg daily  2. Continue Xanax 0.25 mg as needed for anxiety  3. Return to clinic in one month for 30 mins 4. Referred to therapy

## 2017-11-26 DIAGNOSIS — I10 Essential (primary) hypertension: Secondary | ICD-10-CM | POA: Diagnosis not present

## 2017-11-26 DIAGNOSIS — R030 Elevated blood-pressure reading, without diagnosis of hypertension: Secondary | ICD-10-CM | POA: Diagnosis not present

## 2017-12-01 ENCOUNTER — Telehealth (HOSPITAL_COMMUNITY): Payer: Self-pay | Admitting: *Deleted

## 2017-12-01 ENCOUNTER — Ambulatory Visit (INDEPENDENT_AMBULATORY_CARE_PROVIDER_SITE_OTHER): Payer: Medicare Other | Admitting: Psychiatry

## 2017-12-01 DIAGNOSIS — F419 Anxiety disorder, unspecified: Secondary | ICD-10-CM

## 2017-12-01 NOTE — Telephone Encounter (Signed)
Dr Modesta Messing  Patient stopped by after therapy & stated that the Zoloft after taking the next day she had lost of appetite & that she felt sad. And with her anxiety it scared her a little. She wonders if she should continue or stop   all together.
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
## 2017-12-03 NOTE — Telephone Encounter (Signed)
Message left with travis to fax over needed form for completion for her to be able to get a new chair

## 2017-12-28 ENCOUNTER — Ambulatory Visit (HOSPITAL_COMMUNITY): Payer: Self-pay | Admitting: Psychiatry

## 2018-01-01 ENCOUNTER — Other Ambulatory Visit: Payer: Self-pay | Admitting: Family Medicine

## 2018-01-04 NOTE — Progress Notes (Signed)
[facility] MD/PA/NP OP Progress Note  01/06/2018 2:03 PM Janet Acevedo  MRN:  [id number]  Chief Complaint:  Chief Complaint    Follow-up; Anxiety     HPI:  The patient presents for follow-up appointment for depression.  She states that she has been feeling somewhat better over the past week.  She has been trying to focus on creating space for herself. She hopes to "co exist." She feels anxious when she has argument with her girlfriend as she likes peace.  She also thinks that her girlfriend it sometimes immature. She enjoys reading. She has decreased appetite and discontinued sertraline after tried once. She is willing to try it again and is also open to try other antidepressant if it does not work. She denies insomnia. She feels less fatigue. She has good motivation. She denies SI. She feels less anxious, tense. She denies panic attacks. She denies irritability.   Wt Readings from Last 3 Encounters:  11/25/17 152 lb (68.9 kg)  09/02/17 152 lb (68.9 kg)  01/12/17 169 lb (76.7 kg)    Visit Diagnosis:    ICD-10-CM   1. Anxiety disorder, unspecified type F41.9     Past Psychiatric History: Please see initial evaluation for full details. I have reviewed the history. No updates at this time.     Past Medical History:  Past Medical History:  Diagnosis Date  . Allergy   . Anxiety   . Asthma   . Depression, major, single episode, moderate ([place]) 09/05/2017  . GERD (gastroesophageal reflux disease)   . Neuromuscular disorder ([place])   . Osteomyelitis [facility]) June 2011  . Perennial allergic rhinitis   . Seborrheic dermatitis of scalp 2006  . Spinal muscle atrophy ([place])    type 2/3 18 months    Past Surgical History:  Procedure Laterality Date  . bilateral hip reinsertion  under age 89   hips out of socket, pt was only able to cruise hold on and move short distances   . BONE BIOPSY     spinal bone   . MIRENA PLACED 10/27/10    . SPINAL FUSION  1999   rods placed in thoracolumbar spine to  excessive scoliosis primarily     Family Psychiatric History: Please see initial evaluation for full details. I have reviewed the history. No updates at this time.     Family History:  Family History  Problem Relation Age of Onset  . Hyperlipidemia Mother   . Hypertension Mother   . Hypertension Brother   . Diabetes Brother        borderline   . Anxiety disorder Brother   . Depression Brother   . Drug abuse Brother     Social History:  Social History   Socioeconomic History  . Marital status: Single    Spouse name: Not on file  . Number of children: Not on file  . Years of education: Not on file  . Highest education level: Associate degree: academic program  Occupational History  . Occupation: Ship broker  Social Needs  . Financial resource strain: Not hard at all  . Food insecurity:    Worry: Never true    Inability: Never true  . Transportation needs:    Medical: No    Non-medical: No  Tobacco Use  . Smoking status: Never Smoker  . Smokeless tobacco: Never Used  Substance and Sexual Activity  . Alcohol use: No  . Drug use: No  . Sexual activity: Never  Lifestyle  . Physical activity:  Days per week: 0 days    Minutes per session: 0 min  . Stress: Not at all  Relationships  . Social connections:    Talks on phone: More than three times a week    Gets together: More than three times a week    Attends religious service: More than 4 times per year    Active member of club or organization: No    Attends meetings of clubs or organizations: Never    Relationship status: Not on file  Other Topics Concern  . Not on file  Social History Narrative  . Not on file    Allergies:  Allergies  Allergen Reactions  . Ciprofloxacin Nausea And Vomiting    Metabolic Disorder Labs: Lab Results  Component Value Date   HGBA1C 5.1 02/08/2017   MPG 100 02/08/2017   MPG 105 05/29/2015   No results found for: PROLACTIN Lab Results  Component Value Date   CHOL 172  02/08/2017   TRIG 62 02/08/2017   HDL 33 (L) 02/08/2017   CHOLHDL 5.2 02/08/2017   VLDL 12 02/08/2017   LDLCALC 127 (H) 02/08/2017   LDLCALC 136 (H) 10/22/2015   Lab Results  Component Value Date   TSH 1.393 02/08/2017   TSH 0.697 05/29/2015    Therapeutic Level Labs: No results found for: LITHIUM No results found for: VALPROATE No components found for:  CBMZ  Current Medications: Current Outpatient Medications  Medication Sig Dispense Refill  . albuterol (ACCUNEB) 1.25 MG/3ML nebulizer solution TAKE 3 MLS (1.25 MG TOTAL) BY NEBULIZATION EVERY 4 (FOUR) HOURS AS NEEDED. DX J45.40 75 mL 1  . albuterol (VENTOLIN HFA) 108 (90 Base) MCG/ACT inhaler INHALE 2 PUFFS INTO THE LUNGS EVERY 4 (FOUR) HOURS AS NEEDED FOR WHEEZING OR SHORTNESS OF BREATH. 18 Inhaler 0  . ALPRAZolam (XANAX) 0.25 MG tablet Take 1 tablet (0.25 mg total) by mouth 2 (two) times daily as needed for anxiety. 30 tablet 1  . betamethasone dipropionate (DIPROLENE) 0.05 % cream APPLY TOPICALLY 2 (TWO) TIMES DAILY. 45 g 3  . Biotin (BIOTIN MAXIMUM STRENGTH) 10 MG TABS Take 1 tablet by mouth daily.    . cromolyn (OPTICROM) 4 % ophthalmic solution PLACE 1 DROP INTO BOTH EYES FOUR TIMES DAILY 30 mL 11  . Fluocinolone Acetonide Scalp 0.01 % OIL Apply 1 application topically as needed. 118 mL 2  . ketoconazole (NIZORAL) 2 % cream Apply 1 application topically as needed. dermatitis 15 g 3  . levocetirizine (XYZAL) 5 MG tablet TAKE 1 TABLET (5 MG TOTAL) BY MOUTH EVERY EVENING. 90 tablet 3  . montelukast (SINGULAIR) 10 MG tablet TAKE 1 TABLET BY MOUTH EVERY DAY 90 tablet 1  . nystatin (MYCOSTATIN/NYSTOP) powder APPLY TWICE DAILY TO AFFECTED AREA AS NEEDED 60 g 3  . omeprazole (PRILOSEC) 40 MG capsule One capsule twice daily effective 07/2017  Per GI at [facility] 30 capsule 3  . Spacer/Aero-Holding Chambers (AEROCHAMBER PLUS) inhaler Use as instructed 1 each 2  . SYMBICORT 160-4.5 MCG/ACT inhaler TAKE 2 PUFFS BY MOUTH TWICE A DAY 30.6  Inhaler 0  . UNABLE TO FIND Gloves- 1 box per month as needed 200 each prn  . UNABLE TO FIND Under pads- for use daily as needed 100 each prn   No current facility-administered medications for this visit.      Musculoskeletal: Strength & Muscle Tone: within normal limits Gait & Station: on wheelchair Patient leans: N/A  Psychiatric Specialty Exam: ROS 12 point ROS negative. Denies SI, HI,  AH. VH. No memory loss. No dizziness. No palpitation, shortness of breath or headache.   Blood pressure 125/70, pulse 91, height 5\' 6"  (1.676 m), SpO2 100 %.Body mass index is 24.53 kg/m.  General Appearance: Fairly Groomed  Eye Contact:  Good  Speech:  Clear and Coherent  Volume:  Normal  Mood:  Depressed  Affect:  Appropriate, Congruent and less down  Thought Process:  Coherent  Orientation:  Full (Time, Place, and Person)  Thought Content: Logical   Suicidal Thoughts:  No  Homicidal Thoughts:  No  Memory:  Immediate;   Good  Judgement:  Good  Insight:  Good  Psychomotor Activity:  Normal  Concentration:  Concentration: Good and Attention Span: Good  Recall:  Good  Fund of Knowledge: Good  Language: Good  Akathisia:  No  Handed:  Right  AIMS (if indicated): not done  Assets:  Communication Skills Desire for Improvement  ADL's:  Intact  Cognition: WNL  Sleep:  Good   Screenings: GAD-7     Office Visit from 09/02/2017 in [facility] Office Visit from 02/08/2017 in [facility]  Total GAD-7 Score  20  18    PHQ2-9     Office Visit from 01/05/2018 in [facility] Office Visit from 09/02/2017 in [facility] from 07/12/2017 in [facility] Visit from 04/08/2017 in [facility]  PHQ-2 Total Score  0  4  0  0  PHQ-9 Total Score  -  13  -  -       Assessment and Plan:  Janet Acevedo is a 27 y.o. year old female with a history of anxiety, Type II spinal muscular atrophy (wheel chair dependent),  micrognasia , who presents for follow up appointment for Anxiety disorder, unspecified type  # Unspecified anxiety disorder Patient reports slight improvement in anxiety and neurovegetative symptoms, which coincided with the patient trying to have some boundary with her girlfriend. She could not continue sertraline due to appetite loss. Will reinitiate sertraline to see if the side effect subsides as she continues to take  Medication.  She is advised to contact the office if no improvement in side effect.  Will consider Lexapro if we were to switch her medication.  She has not taken any Xanax since the last appointment.  Discussed self compassion.  Discussed healthy boundary.  She is advised to continue to see a therapist.   Plan I have reviewed and updated plans as below 1. Start sertraline 25 mg daily for two week, then 50 mg daily  2. Continue Xanax 0.25 mg as needed for anxiety (prescribed by PCP) 3.Return to clinic in one month for 30 mins  The patient demonstrates the following risk factors for suicide: Chronic risk factors for suicide include:psychiatric disorder ofanxiety. Acute risk factorsfor suicide include: loss (financial, interpersonal, professional). Protective factorsfor this patient include: positive social support, coping skills and hope for the future. Considering these factors, the overall suicide risk at this point appears to below. Patientisappropriate for outpatient follow up.  The duration of this appointment visit was 30 minutes of face-to-face time with the patient.  Greater than 50% of this time was spent in counseling, explanation of  diagnosis, planning of further management, and coordination of care.  Norman Clay, MD 01/06/2018, 2:03 PM

## 2018-01-05 ENCOUNTER — Encounter: Payer: Self-pay | Admitting: Family Medicine

## 2018-01-05 ENCOUNTER — Ambulatory Visit (INDEPENDENT_AMBULATORY_CARE_PROVIDER_SITE_OTHER): Payer: Medicare Other | Admitting: Family Medicine

## 2018-01-05 VITALS — BP 124/82 | HR 107 | Resp 16

## 2018-01-05 DIAGNOSIS — F321 Major depressive disorder, single episode, moderate: Secondary | ICD-10-CM

## 2018-01-05 DIAGNOSIS — K219 Gastro-esophageal reflux disease without esophagitis: Secondary | ICD-10-CM | POA: Diagnosis not present

## 2018-01-05 DIAGNOSIS — J302 Other seasonal allergic rhinitis: Secondary | ICD-10-CM | POA: Diagnosis not present

## 2018-01-05 DIAGNOSIS — J4540 Moderate persistent asthma, uncomplicated: Secondary | ICD-10-CM

## 2018-01-05 DIAGNOSIS — G121 Other inherited spinal muscular atrophy: Secondary | ICD-10-CM | POA: Diagnosis not present

## 2018-01-05 NOTE — Assessment & Plan Note (Signed)
Controlled, no change in medication  

## 2018-01-05 NOTE — Assessment & Plan Note (Signed)
Marked improvement with psychiatric services and break from college

## 2018-01-05 NOTE — Assessment & Plan Note (Signed)
  Patient re-educated about  The importance of healthy food choices with portion control discussed. .   Weight /BMI 09/02/2017 04/08/2017 01/20/2017  WEIGHT 152 lb - -  HEIGHT 5\' 6"  - 5\' 7"   BMI 24.53 kg/m2 - -  Some encounter information is confidential and restricted. Go to Review Flowsheets activity to see all data.

## 2018-01-05 NOTE — Assessment & Plan Note (Signed)
Patient with spinal muscular atrophy from birth who has been wheelchair dependent from 27 years old. She is incapable of  weight bearing or independent ambulation, and her upper extremity strength is  Limited to the extent that she is unable to self propel in a manual wheelchair.  She has  used her current  power wheelchair  For just over 4 years, however , it is not only a safety hazard  , as , despite attempts at repair, the charger for the chair has not worked for over 6 months. with  [place] in the chair , the estimated weight of the chair is over 400 pounds There is also limitation in how beneficial it is, in that the despite attempts to repair , for the past 4 months, the seat cannot be elevated, which limits her independence and function. She is limitd even in her ability to feed herself, and hr distant vision is reduced. Teshara does needs a new power wheelchair now, as the one she currently has cannot be repaired for her to continue to use safely and is not allowing her an optimal level of function inside and outside of her home

## 2018-01-05 NOTE — Progress Notes (Signed)
Janet Acevedo     MRN: [id number]      DOB: 04-21-1991   HPI Janet Acevedo is here for evaluation for wheelchair renewal.  Has required motorized wheelchair since  Age 27 due to her genetic illness. Current wheelchair is over 4 years  Old, next actually due in March 2020. However, current chair has problems with seat elevation and charging. Has not been able to elevate her seat for 4 months, equipment has been replaced to try to get this fixed and no success.  Janet Acevedo is therefore unable to reach for things in her home , limited in basic activities of daily living even the ability to feed herself. Distance  Vision, is affected and if she was still currently in class  This would would pose a significant problem. She does  hope is to return to class in August of this year The charger for the chair has not worked since 2018 and this makes moving the chair which weighs over 400 pounds with Janet Acevedo in it a risky endevour  Far too often. She has started seeing behavioral health and has found this extremely beneficial. Though not taking the medication prescribed by the psychiatrist as she said it made her lose her appetite, she found that therapy has made a huge difference in a positive way     ROS Denies recent fever or chills. Denies sinus pressure, nasal congestion, ear pain or sore throat. Denies chest congestion, productive cough or wheezing. Denies chest pains, palpitations and leg swelling Denies abdominal pain, nausea, vomiting,diarrhea or constipation.   Denies dysuria, frequency, hesitancy Denies headaches, seizures, numbness, or tingling. Denies uncontrolled depression, anxiety or insomnia. Denies skin break down or rash.   PE  BP 124/82   Pulse (!) 107   Resp 16   SpO2 98%   Patient alert and oriented and in no cardiopulmonary distress.  HEENT: No facial asymmetry, EOMI,   oropharynx pink and moist.  Neck decreased ROM no JVD, no mass.  Chest: Clear to auscultation  bilaterally.  CVS: S1, S2 no murmurs, no S3.Regular rate.  ABD: Soft non tender.   Ext: No edema  MS: Decreased seat cannot be elevated , despite repair attempts, fore the past 4 months, this has been the case ROM spine, shoulders, hips and knees.  Skin: Intact, no ulcerations or rash noted.  Psych: Good eye contact, normal affect. Memory intact not anxious or depressed appearing.  CNS: CN 2-12 intact, power,  normal throughout.no focal deficits noted.   Assessment & Plan  Type II spinal muscular atrophy ([place]) Patient with spinal muscular atrophy from birth who has been wheelchair dependent from 27 years old. She is incapable of  weight bearing or independent ambulation, and her upper extremity strength is  Limited to the extent that she is unable to self propel in a manual wheelchair.  She has  used her current  power wheelchair  For just over 4 years, however , it is not only a safety hazard  , as , despite attempts at repair, the charger for the chair has not worked for over 6 months. with  [place] in the chair , the estimated weight of the chair is over 400 pounds There is also limitation in how beneficial it is, in that the despite attempts to repair , for the past 4 months, the seat cannot be elevated, which limits her independence and function. She is limitd even in her ability to feed herself, and hr distant vision is reduced.  Janet Acevedo does needs a new power wheelchair now, as the one she currently has cannot be repaired for her to continue to use safely and is not allowing her an optimal level of function inside and outside of her home  Seasonal allergies Controlled, no change in medication   Morbid obesity  Patient re-educated about  The importance of healthy food choices with portion control discussed. .   Weight /BMI 09/02/2017 04/08/2017 01/20/2017  WEIGHT 152 lb - -  HEIGHT 5\' 6"  - 5\' 7"   BMI 24.53 kg/m2 - -  Some encounter information is confidential and restricted. Go to  Review Flowsheets activity to see all data.      GERD Controlled, no change in medication   Moderate persistent asthma Controlled, no change in medication   Depression, major, single episode, moderate ([facility]) Marked improvement with psychiatric services and break from college

## 2018-01-05 NOTE — Patient Instructions (Addendum)
F/U in 4 months, call if you need me before   You do qualify for new wheelchair , I will try to get this   All the best , glad t you feel much better  Happy 27!  Fasting CBC, lipid, chem 7 , vit D

## 2018-01-06 ENCOUNTER — Ambulatory Visit (INDEPENDENT_AMBULATORY_CARE_PROVIDER_SITE_OTHER): Payer: Medicare Other | Admitting: Psychiatry

## 2018-01-06 ENCOUNTER — Encounter (HOSPITAL_COMMUNITY): Payer: Self-pay | Admitting: Psychiatry

## 2018-01-06 VITALS — BP 125/70 | HR 91 | Ht 66.0 in

## 2018-01-06 DIAGNOSIS — Z818 Family history of other mental and behavioral disorders: Secondary | ICD-10-CM | POA: Diagnosis not present

## 2018-01-06 DIAGNOSIS — R7301 Impaired fasting glucose: Secondary | ICD-10-CM | POA: Diagnosis not present

## 2018-01-06 DIAGNOSIS — F419 Anxiety disorder, unspecified: Secondary | ICD-10-CM

## 2018-01-06 DIAGNOSIS — E559 Vitamin D deficiency, unspecified: Secondary | ICD-10-CM | POA: Diagnosis not present

## 2018-01-06 DIAGNOSIS — Z79899 Other long term (current) drug therapy: Secondary | ICD-10-CM | POA: Diagnosis not present

## 2018-01-06 DIAGNOSIS — G129 Spinal muscular atrophy, unspecified: Secondary | ICD-10-CM | POA: Diagnosis not present

## 2018-01-06 DIAGNOSIS — Z813 Family history of other psychoactive substance abuse and dependence: Secondary | ICD-10-CM | POA: Diagnosis not present

## 2018-01-06 NOTE — Patient Instructions (Signed)
1. Start sertraline 25 mg daily for two week, then 50 mg daily  2. Return to clinic in one month for 30 mins

## 2018-01-07 ENCOUNTER — Encounter: Payer: Self-pay | Admitting: Family Medicine

## 2018-01-07 ENCOUNTER — Other Ambulatory Visit: Payer: Self-pay | Admitting: Family Medicine

## 2018-01-07 LAB — LIPID PANEL
CHOL/HDL RATIO: 5 (calc) — AB (ref <5.0)
Cholesterol: 184 mg/dL (ref <200)
HDL: 37 mg/dL — ABNORMAL LOW (ref >50)
LDL CHOLESTEROL (CALC): 131 mg/dL — AB
NON-HDL CHOLESTEROL (CALC): 147 mg/dL — AB (ref <130)
Triglycerides: 68 mg/dL (ref <150)

## 2018-01-07 LAB — VITAMIN D 25 HYDROXY (VIT D DEFICIENCY, FRACTURES): VIT D 25 HYDROXY: 28 ng/mL — AB (ref 30–100)

## 2018-01-07 LAB — CBC
HEMATOCRIT: 38.3 % (ref 35.0–45.0)
Hemoglobin: 12.9 g/dL (ref 11.7–15.5)
MCH: 28 pg (ref 27.0–33.0)
MCHC: 33.7 g/dL (ref 32.0–36.0)
MCV: 83.3 fL (ref 80.0–100.0)
MPV: 11.4 fL (ref 7.5–12.5)
PLATELETS: 239 10*3/uL (ref 140–400)
RBC: 4.6 10*6/uL (ref 3.80–5.10)
RDW: 12 % (ref 11.0–15.0)
WBC: 9.2 10*3/uL (ref 3.8–10.8)

## 2018-01-07 LAB — HEMOGLOBIN A1C
EAG (MMOL/L): 4.9 (calc)
Hgb A1c MFr Bld: 4.7 % of total Hgb (ref <5.7)
MEAN PLASMA GLUCOSE: 88 (calc)

## 2018-01-07 LAB — TSH: TSH: 1.06 mIU/L

## 2018-01-07 MED ORDER — ERGOCALCIFEROL 1.25 MG (50000 UT) PO CAPS: 50000.0000 [IU] | ORAL_CAPSULE | ORAL | 3 refills | Status: DC

## 2018-02-01 ENCOUNTER — Ambulatory Visit (INDEPENDENT_AMBULATORY_CARE_PROVIDER_SITE_OTHER): Payer: Medicare Other | Admitting: Psychiatry

## 2018-02-01 ENCOUNTER — Other Ambulatory Visit: Payer: Self-pay | Admitting: Family Medicine

## 2018-02-01 DIAGNOSIS — F411 Generalized anxiety disorder: Secondary | ICD-10-CM

## 2018-02-01 NOTE — Progress Notes (Signed)
   THERAPIST PROGRESS NOTE  Session Time: Tuesday 02/01/2018 1:16 PM -2:00 PM  Participation Level: Active  Behavioral Response: CasualAlertAnxious  Type of Therapy: Individual Therapy  Treatment Goals addressed: Establish rapport, learn and implement cognitive and behavioral strategies to cope with anxiety  Interventions: CBT and Supportive  Summary: Janet Acevedo is a 27 y.o. female who is referred for services by psychiatrist Dr. Modesta Messing due to patient experiencing symptoms of anxiety. She denies any psychiatric hospitalizations and no previous involvement in psychotherapy.  patient reports having uncontrollable anxiety and finding herself in an anxious state every day. She reports having anxiety since childhood but has worsened in the last 1-2 years. She reports stress regarding an aunt who is a quadriplegic as her health has fluctuated. Patient reports being very close with his aunt. She also reports dealing with her sexuality and says she has had a girlfriend secretly for the past 8 years. She says she used to be okay with this but now being tired of hiding it. She also worries about her brother who is in prison and openly gay. Patient's reported symptoms of anxiety include "sweaty palms, hands tremble alot, voice is shakey, emotional outbursts of crying, increased heart rate".  Patient was last seen about 2 months ago for assessment. She reports continued symptoms of anxiety. She reports stress regarding the relationship with her mother and the relationship with her partner. Per patient's report, both of them are" snappy" with her and make hurtful comments. Patient reports difficulty expressing her concerns and feelings to her mother and her partner. Patient reports partner can be verbally aggressive. Patient states wanting to be more independent but also expresses anxiety and fear about being away from her mother and her partner.  Suicidal/Homicidal: Nowithout intent/plan  Therapist  Response: Establish rapport, reviewed symptoms, administered GAD-7, discussed stressors, facilitated expression of thoughts and feelings, validated feelings, examined patient's pattern of interaction with her mother and partner, oriented patient to CBT, assisted patient to begin to identify targets(gain a sense of independence) and goals(go places without mother and partner)  Plan: Return again in 2 weeks.  Diagnosis: Axis I: Generalized Anxiety Disorder    Axis II: Deferred    Janet Springsteen, LCSW 02/01/2018

## 2018-02-03 NOTE — Progress Notes (Signed)
[place] MD/PA/NP OP Progress Note  02/08/2018 1:39 PM Janet Acevedo  MRN:  [id number]  Chief Complaint:  Chief Complaint    Follow-up; Anxiety     HPI:  Patient presents for follow-up appointment for anxiety.  She states that she has been feeling more anxious lately.  She states that she notices that she has some sweating in her palm. Did cognitive analysis. She states that her thought of hypertension on today's visit makes her feel anxious as she does have a family history of hypertension. Discussed cognitive defusion. She agrees that she tends to ruminate on future, which makes her feel more anxious. She has not started sertraline yet. She talked with her brother in prison on the phone; he talked about people who is on psychiatry medication, and who look like "zombie." She does not want to be like a zombie. She agrees to contact the office if she experiences any side effect. She states that she needs to take medication as there has been no progress in her anxiety lately. She has not taken xanax. She "co-exist" with her partner, and she likes it as it brings her more "peace." She started to write pray journal and it helps her as well. She has insomnia at times. She denies feeling depressed. She has had intense anxiety with shortness of breath. She denies irritability. She denies SI.   Visit Diagnosis:    ICD-10-CM   1. Generalized anxiety disorder F41.1     Past Psychiatric History: Please see initial evaluation for full details. I have reviewed the history. No updates at this time.     Past Medical History:  Past Medical History:  Diagnosis Date  . Allergy   . Anxiety   . Asthma   . Depression, major, single episode, moderate ([place]) 09/05/2017  . GERD (gastroesophageal reflux disease)   . Neuromuscular disorder ([place])   . Osteomyelitis [facility]) June 2011  . Perennial allergic rhinitis   . Seborrheic dermatitis of scalp 2006  . Spinal muscle atrophy ([place])    type 2/3 18 months    Past  Surgical History:  Procedure Laterality Date  . bilateral hip reinsertion  under age 46   hips out of socket, pt was only able to cruise hold on and move short distances   . BONE BIOPSY     spinal bone   . MIRENA PLACED 10/27/10    . SPINAL FUSION  1999   rods placed in thoracolumbar spine to excessive scoliosis primarily     Family Psychiatric History: Please see initial evaluation for full details. I have reviewed the history. No updates at this time.     Family History:  Family History  Problem Relation Age of Onset  . Hyperlipidemia Mother   . Hypertension Mother   . Hypertension Brother   . Diabetes Brother        borderline   . Anxiety disorder Brother   . Depression Brother   . Drug abuse Brother     Social History:  Social History   Socioeconomic History  . Marital status: Single    Spouse name: Not on file  . Number of children: Not on file  . Years of education: Not on file  . Highest education level: Associate degree: academic program  Occupational History  . Occupation: Ship broker  Social Needs  . Financial resource strain: Not hard at all  . Food insecurity:    Worry: Never true    Inability: Never true  . Transportation needs:  Medical: No    Non-medical: No  Tobacco Use  . Smoking status: Never Smoker  . Smokeless tobacco: Never Used  Substance and Sexual Activity  . Alcohol use: No  . Drug use: No  . Sexual activity: Never  Lifestyle  . Physical activity:    Days per week: 0 days    Minutes per session: 0 min  . Stress: Not at all  Relationships  . Social connections:    Talks on phone: More than three times a week    Gets together: More than three times a week    Attends religious service: More than 4 times per year    Active member of club or organization: No    Attends meetings of clubs or organizations: Never    Relationship status: Not on file  Other Topics Concern  . Not on file  Social History Narrative  . Not on file     Allergies:  Allergies  Allergen Reactions  . Ciprofloxacin Nausea And Vomiting    Metabolic Disorder Labs: Lab Results  Component Value Date   HGBA1C 4.7 01/06/2018   MPG 88 01/06/2018   MPG 100 02/08/2017   No results found for: PROLACTIN Lab Results  Component Value Date   CHOL 184 01/06/2018   TRIG 68 01/06/2018   HDL 37 (L) 01/06/2018   CHOLHDL 5.0 (H) 01/06/2018   VLDL 12 02/08/2017   LDLCALC 131 (H) 01/06/2018   LDLCALC 127 (H) 02/08/2017   Lab Results  Component Value Date   TSH 1.06 01/06/2018   TSH 1.393 02/08/2017    Therapeutic Level Labs: No results found for: LITHIUM No results found for: VALPROATE No components found for:  CBMZ  Current Medications: Current Outpatient Medications  Medication Sig Dispense Refill  . albuterol (ACCUNEB) 1.25 MG/3ML nebulizer solution TAKE 3 MLS (1.25 MG TOTAL) BY NEBULIZATION EVERY 4 (FOUR) HOURS AS NEEDED. DX J45.40 75 mL 1  . albuterol (VENTOLIN HFA) 108 (90 Base) MCG/ACT inhaler INHALE 2 PUFFS INTO THE LUNGS EVERY 4 (FOUR) HOURS AS NEEDED FOR WHEEZING OR SHORTNESS OF BREATH. 18 Inhaler 0  . ALPRAZolam (XANAX) 0.25 MG tablet Take 1 tablet (0.25 mg total) by mouth 2 (two) times daily as needed for anxiety. 30 tablet 1  . betamethasone dipropionate (DIPROLENE) 0.05 % cream APPLY TOPICALLY 2 (TWO) TIMES DAILY. 45 g 3  . Biotin (BIOTIN MAXIMUM STRENGTH) 10 MG TABS Take 1 tablet by mouth daily.    . cromolyn (OPTICROM) 4 % ophthalmic solution PLACE 1 DROP INTO BOTH EYES FOUR TIMES DAILY 30 mL 11  . ergocalciferol (VITAMIN D2) 50000 units capsule Take 1 capsule (50,000 Units total) by mouth once a week. One capsule once weekly 12 capsule 3  . Fluocinolone Acetonide Scalp 0.01 % OIL Apply 1 application topically as needed. 118 mL 2  . ketoconazole (NIZORAL) 2 % cream APPLY 1 APPLICATION TOPICALLY AS NEEDED FOR DERMATITIS 15 g 3  . levocetirizine (XYZAL) 5 MG tablet TAKE 1 TABLET (5 MG TOTAL) BY MOUTH EVERY EVENING. 90 tablet  3  . montelukast (SINGULAIR) 10 MG tablet TAKE 1 TABLET BY MOUTH EVERY DAY 90 tablet 1  . nystatin (MYCOSTATIN/NYSTOP) powder APPLY TWICE DAILY TO AFFECTED AREA AS NEEDED 60 g 3  . omeprazole (PRILOSEC) 40 MG capsule One capsule twice daily effective 07/2017  Per GI at [facility] 30 capsule 3  . Spacer/Aero-Holding Chambers (AEROCHAMBER PLUS) inhaler Use as instructed 1 each 2  . SYMBICORT 160-4.5 MCG/ACT inhaler TAKE 2 PUFFS BY  MOUTH TWICE A DAY 30.6 Inhaler 0  . UNABLE TO FIND Gloves- 1 box per month as needed 200 each prn  . UNABLE TO FIND Under pads- for use daily as needed 100 each prn   No current facility-administered medications for this visit.      Musculoskeletal: Strength & Muscle Tone: decreased Gait & Station: on wheelchair Patient leans: N/A  Psychiatric Specialty Exam: Review of Systems  Psychiatric/Behavioral: Negative for depression, hallucinations, substance abuse and suicidal ideas. The patient is nervous/anxious. The patient does not have insomnia.   All other systems reviewed and are negative.   Blood pressure (!) 136/109, pulse (!) 102, SpO2 100 %.There is no height or weight on file to calculate BMI.  General Appearance: Fairly Groomed  Eye Contact:  Good  Speech:  Clear and Coherent  Volume:  Normal  Mood:  Anxious  Affect:  Appropriate, Congruent and slightly tense  Thought Process:  Coherent  Orientation:  Full (Time, Place, and Person)  Thought Content: Logical   Suicidal Thoughts:  No  Homicidal Thoughts:  No  Memory:  Immediate;   Good  Judgement:  Good  Insight:  Fair  Psychomotor Activity:  Normal  Concentration:  Concentration: Good and Attention Span: Good  Recall:  Good  Fund of Knowledge: Good  Language: Good  Akathisia:  No  Handed:  Right  AIMS (if indicated): not done  Assets:  Communication Skills Desire for Improvement  ADL's:  Intact  Cognition: WNL  Sleep:  Fair   Screenings: GAD-7     Counselor from 02/01/2018 in [place] Office Visit from 09/02/2017 in [facility] Office Visit from 02/08/2017 in [facility]  Total GAD-7 Score  12  20  18     PHQ2-9     Office Visit from 01/05/2018 in [facility] Office Visit from 09/02/2017 in [place] from 07/12/2017 in [facility] Visit from 04/08/2017 in [facility]  PHQ-2 Total Score  0  4  0  0  PHQ-9 Total Score  -  13  -  -       Assessment and Plan:  Janet Acevedo is a 27 y.o. year old female with a history of anxiety,Type II spinal muscular atrophy (wheel chair dependent), micrognasia , who presents for follow up appointment for Generalized anxiety disorder  # Generalized anxiety disorder Patient reports slight  worsening in her anxiety since the last appointment, although she is unable to pinpoint any triggers. Psychosocial stressors including relationship issues and she also talks about her brother who is in prison. She has not started sertraline yet; provided psycho education of medication. Will start sertraline to target anxiety. Will consider lexapro in the future if she were to have any side effect from sertraline. Discussed self compassion and healthy boundary. She will continue to see Ms. Bynum for therapy.   # Hypertension It is likely white coat hypertension. She is advised to be followed by PCP.   Plan I have reviewed and updated plans as below 1. Start sertraline 25 mg daily for two week, then 50 mg daily  2. Continue Xanax 0.25 mg as needed for anxiety(prescribed by PCP) 3.Return to clinic in one month for 30 mins  The patient demonstrates the following risk factors for suicide: Chronic risk factors for suicide include:psychiatric disorder ofanxiety. Acute risk factorsfor suicide include: loss (financial, interpersonal, professional). Protective factorsfor this patient include: positive social support, coping  skills  and hope for the future. Considering these factors, the overall suicide risk at this point appears to below. Patientisappropriate for outpatient follow up.  The duration of this appointment visit was 30 minutes of face-to-face time with the patient.  Greater than 50% of this time was spent in counseling, explanation of  diagnosis, planning of further management, and coordination of care.  Norman Clay, MD 02/08/2018, 1:39 PM

## 2018-02-08 ENCOUNTER — Ambulatory Visit (INDEPENDENT_AMBULATORY_CARE_PROVIDER_SITE_OTHER): Payer: Medicare Other | Admitting: Psychiatry

## 2018-02-08 ENCOUNTER — Encounter (HOSPITAL_COMMUNITY): Payer: Self-pay | Admitting: Psychiatry

## 2018-02-08 VITALS — BP 136/109 | HR 102

## 2018-02-08 DIAGNOSIS — F411 Generalized anxiety disorder: Secondary | ICD-10-CM | POA: Diagnosis not present

## 2018-02-08 NOTE — Patient Instructions (Signed)
1. Start sertraline 25 mg daily for two week, then 50 mg daily  2. Continue Xanax 0.25 mg as needed for anxiety 3.Return to clinic in one month for 30 mins

## 2018-02-15 ENCOUNTER — Ambulatory Visit (HOSPITAL_COMMUNITY): Payer: Medicaid Other | Admitting: Psychiatry

## 2018-02-24 ENCOUNTER — Other Ambulatory Visit: Payer: Self-pay | Admitting: Family Medicine

## 2018-03-01 ENCOUNTER — Ambulatory Visit (INDEPENDENT_AMBULATORY_CARE_PROVIDER_SITE_OTHER): Payer: Medicare Other | Admitting: Psychiatry

## 2018-03-01 ENCOUNTER — Other Ambulatory Visit: Payer: Self-pay

## 2018-03-01 DIAGNOSIS — F411 Generalized anxiety disorder: Secondary | ICD-10-CM | POA: Diagnosis not present

## 2018-03-01 MED ORDER — UNABLE TO FIND: 99 refills | Status: DC

## 2018-03-01 NOTE — Progress Notes (Signed)
   THERAPIST PROGRESS NOTE  Session Time: Tuesday 03/01/2018 1:15 PM - 2:05 PM   Participation Level: Active  Behavioral Response: CasualAlertAnxious  Type of Therapy: Individual Therapy     Treatment Goals addressed:  learn and implement cognitive and behavioral strategies to cope with anxiety  Interventions: CBT and Supportive  Summary: Janet Acevedo is a 26 y.o. female who is referred for services by psychiatrist Dr. Modesta Messing due to patient experiencing symptoms of anxiety. She denies any psychiatric hospitalizations and no previous involvement in psychotherapy.  patient reports having uncontrollable anxiety and finding herself in an anxious state every day. She reports having anxiety since childhood but has worsened in the last 1-2 years. She reports stress regarding an aunt who is a quadriplegic as her health has fluctuated. Patient reports being very close with his aunt. She also reports dealing with her sexuality and says she has had a girlfriend secretly for the past 8 years. She says she used to be okay with this but now being tired of hiding it. She also worries about her brother who is in prison and openly gay. Patient's reported symptoms of anxiety include "sweaty palms, hands tremble alot, voice is shakey, emotional outbursts of crying, increased heart rate".  Patient was last seen about a month ago.She reports continued symptoms of anxiety. She reports continued stress regarding the relationship with her mother and the relationship with her partner but being more bothered by the relationship with her partner. Per patient's report, partner often ignores patient's request to have space. She reports partner seems to intentionally push her buttons.  She expresses frustration regarding partner but also feeling guilty for wanting space as she states she does not want to hurt partner. Patient states feeling responsible for partner's happiness. She also reports being dependent on partner to help  her physically due to her condition. Patient reports she still has not begun taking medication due to fears of adverse effects.   Suicidal/Homicidal: Nowithout intent/plan  Therapist Response: reviewed symptoms, administered GAD-7, discussed stressors, facilitated expression of thoughts and feelings, validated feelings, discussed connection between thoughts/feelings/behavior using examples from patient's life and her relationships with partner and mother, discussed next steps for treatment and interventions including relaxation techniques, cognitive restructuring, exposure exercises and assertiveness training,  began to provide psychoeducation on generalized anxiety/treatment  and provided patient with handout to review by next session, began to assist patient identify the way she experiences anxiety, discussed the stress response and how to use deep breathing to trigger relaxation response, assigned patient to practice deep breathing 5-10 minutes 2 x per day   Plan: Return again in 2 weeks.  Diagnosis: Axis I: Generalized Anxiety Disorder    Axis II: Deferred    Ching Rabideau, LCSW 03/01/2018

## 2018-03-15 ENCOUNTER — Ambulatory Visit (INDEPENDENT_AMBULATORY_CARE_PROVIDER_SITE_OTHER): Payer: Medicare Other | Admitting: Psychiatry

## 2018-03-15 DIAGNOSIS — F411 Generalized anxiety disorder: Secondary | ICD-10-CM | POA: Diagnosis not present

## 2018-03-15 NOTE — Progress Notes (Signed)
   THERAPIST PROGRESS NOTE  Session Time: Tuesday 03/15/2018 1:13 PM - 2:00 PM   Participation Level: Active  Behavioral Response: CasualAlertAnxious  Type of Therapy: Individual Therapy     Treatment Goals addressed:  learn and implement cognitive and behavioral strategies to cope with anxiety  Interventions: CBT and Supportive  Summary: Janet Acevedo is a 27 y.o. female who is referred for services by psychiatrist Dr. Modesta Messing due to patient experiencing symptoms of anxiety. She denies any psychiatric hospitalizations and no previous involvement in psychotherapy.  patient reports having uncontrollable anxiety and finding herself in an anxious state every day. She reports having anxiety since childhood but has worsened in the last 1-2 years. She reports stress regarding an aunt who is a quadriplegic as her health has fluctuated. Patient reports being very close with his aunt. She also reports dealing with her sexuality and says she has had a girlfriend secretly for the past 8 years. She says she used to be okay with this but now being tired of hiding it. She also worries about her brother who is in prison and openly gay. Patient's reported symptoms of anxiety include "sweaty palms, hands tremble alot, voice is shakey, emotional outbursts of crying, increased heart rate".  Patient was last seen about 2 weeks ago.She reports continued symptoms of anxiety. She expresses worry about beginning college classes tomorrow as she fears accommodations will not be in place when she arrives in class. Per patient's report, personnel from the disability office at the college frequently have not followed through on providing accommodations after patient has contacted office for assistance. Patient reports she still has not begun taking medication due to fears of adverse effects.   Suicidal/Homicidal: Nowithout intent/plan  Therapist Response: reviewed symptoms, discussed stressors, facilitated expression of  thoughts and feelings, validated feelings, discussed connection between thoughts/feelings/behavior using examples from patient's life, introduced daily thought record and assisted patient identify/challenge/and replace unhelpful thoughts about preparation for class with helpful thought, also used cognitive restructuring to replace unhelpful thought about taking medication with helpful thought, assigned patient to complete daily thought record and bring to next session   Plan: Return again in 2 weeks.  Diagnosis: Axis I: Generalized Anxiety Disorder    Axis II: Deferred    BYNUM,PEGGY, LCSW 03/15/2018

## 2018-03-15 NOTE — Progress Notes (Signed)
BH MD/PA/NP OP Progress Note  03/17/2018 3:44 PM Janet Acevedo  MRN:  [id number]  Chief Complaint:  Chief Complaint    Follow-up; Anxiety     HPI:  Patient presents for follow-up appointment for anxiety.  She has not started sertraline yet; she is still concerned about potential adverse reaction.  However, she feels ready to start this weekend.  She states that she has started to go to school.  She tends to feel very anxious when she is in crowds.  She hopes to get a masters degree; she hopes to finish in a few years.  She may consider going to law school in the future.  She tries to have some distance with her girlfriend as her girlfriend tends to be manipulative.  She also wants to take care of herself rather than focusing on relationship.  She occasionally feels that she receives "silent treatment " from her mother. She takes it as "punishment" when her mother does not share things with the patient, referring that they used to communicate with each others more. Kare tends to write things when she has something in private rather than sharing it with her mother. She understands that we cannot speculate other people's motive behind their action. She occasionally feels sad when she is unable to physically help her God children, stating that she always needs to ask help from her mother or her girlfriend. She has fair sleep. She has good appetite. She has good energy. She denies SI. She has intense anxiety at times. She has not taken xanax since the last appointment.    Wt Readings from Last 3 Encounters:  11/25/17 152 lb (68.9 kg)  09/02/17 152 lb (68.9 kg)  01/12/17 169 lb (76.7 kg)    Visit Diagnosis:    ICD-10-CM   1. GAD (generalized anxiety disorder) F41.1     Past Psychiatric History: Please see initial evaluation for full details. I have reviewed the history. No updates at this time.     Past Medical History:  Past Medical History:  Diagnosis Date  . Allergy   . Anxiety   .  Asthma   . Depression, major, single episode, moderate ([place]) 09/05/2017  . GERD (gastroesophageal reflux disease)   . Neuromuscular disorder ([place])   . Osteomyelitis [facility]) June 2011  . Perennial allergic rhinitis   . Seborrheic dermatitis of scalp 2006  . Spinal muscle atrophy ([place])    type 2/3 18 months    Past Surgical History:  Procedure Laterality Date  . bilateral hip reinsertion  under age 69   hips out of socket, pt was only able to cruise hold on and move short distances   . BONE BIOPSY     spinal bone   . MIRENA PLACED 10/27/10    . SPINAL FUSION  1999   rods placed in thoracolumbar spine to excessive scoliosis primarily     Family Psychiatric History: Please see initial evaluation for full details. I have reviewed the history. No updates at this time.     Family History:  Family History  Problem Relation Age of Onset  . Hyperlipidemia Mother   . Hypertension Mother   . Hypertension Brother   . Diabetes Brother        borderline   . Anxiety disorder Brother   . Depression Brother   . Drug abuse Brother     Social History:  Social History   Socioeconomic History  . Marital status: Single    Spouse name: Not  on file  . Number of children: Not on file  . Years of education: Not on file  . Highest education level: Associate degree: academic program  Occupational History  . Occupation: Ship broker  Social Needs  . Financial resource strain: Not hard at all  . Food insecurity:    Worry: Never true    Inability: Never true  . Transportation needs:    Medical: No    Non-medical: No  Tobacco Use  . Smoking status: Never Smoker  . Smokeless tobacco: Never Used  Substance and Sexual Activity  . Alcohol use: No  . Drug use: No  . Sexual activity: Never  Lifestyle  . Physical activity:    Days per week: 0 days    Minutes per session: 0 min  . Stress: Not at all  Relationships  . Social connections:    Talks on phone: More than three times a week    Gets  together: More than three times a week    Attends religious service: More than 4 times per year    Active member of club or organization: No    Attends meetings of clubs or organizations: Never    Relationship status: Not on file  Other Topics Concern  . Not on file  Social History Narrative  . Not on file    Allergies:  Allergies  Allergen Reactions  . Ciprofloxacin Nausea And Vomiting    Metabolic Disorder Labs: Lab Results  Component Value Date   HGBA1C 4.7 01/06/2018   MPG 88 01/06/2018   MPG 100 02/08/2017   No results found for: PROLACTIN Lab Results  Component Value Date   CHOL 184 01/06/2018   TRIG 68 01/06/2018   HDL 37 (L) 01/06/2018   CHOLHDL 5.0 (H) 01/06/2018   VLDL 12 02/08/2017   LDLCALC 131 (H) 01/06/2018   LDLCALC 127 (H) 02/08/2017   Lab Results  Component Value Date   TSH 1.06 01/06/2018   TSH 1.393 02/08/2017    Therapeutic Level Labs: No results found for: LITHIUM No results found for: VALPROATE No components found for:  CBMZ  Current Medications: Current Outpatient Medications  Medication Sig Dispense Refill  . albuterol (ACCUNEB) 1.25 MG/3ML nebulizer solution TAKE 3 MLS (1.25 MG TOTAL) BY NEBULIZATION EVERY 4 (FOUR) HOURS AS NEEDED. DX J45.40 75 mL 1  . albuterol (VENTOLIN HFA) 108 (90 Base) MCG/ACT inhaler INHALE 2 PUFFS INTO THE LUNGS EVERY 4 (FOUR) HOURS AS NEEDED FOR WHEEZING OR SHORTNESS OF BREATH. 18 Inhaler 0  . ALPRAZolam (XANAX) 0.25 MG tablet Take 1 tablet (0.25 mg total) by mouth 2 (two) times daily as needed for anxiety. 30 tablet 1  . betamethasone dipropionate (DIPROLENE) 0.05 % cream APPLY TOPICALLY 2 (TWO) TIMES DAILY. 45 g 3  . Biotin (BIOTIN MAXIMUM STRENGTH) 10 MG TABS Take 1 tablet by mouth daily.    . cromolyn (OPTICROM) 4 % ophthalmic solution PLACE 1 DROP INTO BOTH EYES FOUR TIMES DAILY 30 mL 11  . ergocalciferol (VITAMIN D2) 50000 units capsule Take 1 capsule (50,000 Units total) by mouth once a week. One capsule  once weekly 12 capsule 3  . fluconazole (DIFLUCAN) 150 MG tablet ONE TABLET ONCE DAILY, AS NEEDED, FOR VAGINAL ITCH ASSOCIATED WITH ANTIBIOTIC USE 2 tablet 0  . Fluocinolone Acetonide Scalp 0.01 % OIL Apply 1 application topically as needed. 118 mL 2  . ketoconazole (NIZORAL) 2 % cream APPLY 1 APPLICATION TOPICALLY AS NEEDED FOR DERMATITIS 15 g 3  . levocetirizine (XYZAL) 5  MG tablet TAKE 1 TABLET (5 MG TOTAL) BY MOUTH EVERY EVENING. 90 tablet 3  . montelukast (SINGULAIR) 10 MG tablet TAKE 1 TABLET BY MOUTH EVERY DAY 90 tablet 1  . nystatin (MYCOSTATIN/NYSTOP) powder APPLY TWICE DAILY TO AFFECTED AREA AS NEEDED 60 g 3  . omeprazole (PRILOSEC) 40 MG capsule One capsule twice daily effective 07/2017  Per GI at [facility] 30 capsule 3  . Spacer/Aero-Holding Chambers (AEROCHAMBER PLUS) inhaler Use as instructed 1 each 2  . SYMBICORT 160-4.5 MCG/ACT inhaler TAKE 2 PUFFS BY MOUTH TWICE A DAY 30.6 Inhaler 0  . UNABLE TO FIND Gloves- 1 box per month as needed 200 each prn  . UNABLE TO FIND Under pads- for use daily as needed 100 each prn   No current facility-administered medications for this visit.      Musculoskeletal: Strength & Muscle Tone: decreased Gait & Station: on wheelchair Patient leans: N/A  Psychiatric Specialty Exam: Review of Systems  Psychiatric/Behavioral: Positive for depression. Negative for hallucinations, memory loss, substance abuse and suicidal ideas. The patient is nervous/anxious. The patient does not have insomnia.   All other systems reviewed and are negative.   Pulse (!) 102, SpO2 99 %.There is no height or weight on file to calculate BMI. error in BP machine. She is on wheel chair and unable to measure hight, weight.  General Appearance: Fairly Groomed  Eye Contact:  Good  Speech:  Clear and Coherent  Volume:  Normal  Mood:  Anxious  Affect:  Appropriate, Congruent and down at times, but reactive  Thought Process:  Coherent  Orientation:  Full (Time, Place, and  Person)  Thought Content: Logical   Suicidal Thoughts:  No  Homicidal Thoughts:  No  Memory:  Immediate;   Good  Judgement:  Good  Insight:  Good  Psychomotor Activity:  Normal  Concentration:  Concentration: Good and Attention Span: Good  Recall:  Good  Fund of Knowledge: Good  Language: Good  Akathisia:  No  Handed:  Right  AIMS (if indicated): not done  Assets:  Communication Skills Desire for Improvement  ADL's:  Intact  Cognition: WNL  Sleep:  Fair   Screenings: GAD-7     Counselor from 03/01/2018 in [place] Counselor from 02/01/2018 in [place] Office Visit from 09/02/2017 in [facility] Office Visit from 02/08/2017 in [facility]  Total GAD-7 Score  12  (Pended)   12  20  18     PHQ2-9     Office Visit from 01/05/2018 in [facility] Office Visit from 09/02/2017 in [place] from 07/12/2017 in [facility] Office Visit from 04/08/2017 in [facility]  PHQ-2 Total Score  0  4  0  0  PHQ-9 Total Score  -  13  -  -       Assessment and Plan:  SHARLOT STURKEY is a 27 y.o. year old female with a history of anxiety, Type II spinal muscular atrophy (wheel chair dependent), micrognasia, who presents for follow up appointment for GAD (generalized anxiety disorder)  # Generalized anxiety disorder # Social phobia Patient continues to report anxiety and occasional neurovegetative symptoms.  Psychosocial stressors including relationship issues, and she also talks about her brother who is in prison.  She feels ambivalent about starting sertraline with concern for potential side effect, although she is willing to try this weekend.  Provided psycho education about medication.  Discussed effective communication.  Discussed self compassion.  She will continue to see Ms. Bynum for therapy.   Plan I have reviewed and  updated plans as below 1. Start sertraline 25 mg daily for two week, then 50 mg daily  2. Continue Xanax 0.25 mg as needed for anxiety(prescribed by PCP) 3.Return to clinic in one month for 30 mins    The patient demonstrates the following risk factors for suicide: Chronic risk factors for suicide include:psychiatric disorder ofanxiety. Acute risk factorsfor suicide include: loss (financial, interpersonal, professional). Protective factorsfor this patient include: positive social support, coping skills and hope for the future. Considering these factors, the overall suicide risk at this point appears to below. Patientisappropriate for outpatient follow up.  The duration of this appointment visit was 30 minutes of face-to-face time with the patient.  Greater than 50% of this time was spent in counseling, explanation of  diagnosis, planning of further management, and coordination of care.  Norman Clay, MD 03/17/2018, 3:44 PM

## 2018-03-17 ENCOUNTER — Encounter (HOSPITAL_COMMUNITY): Payer: Self-pay | Admitting: Psychiatry

## 2018-03-17 ENCOUNTER — Ambulatory Visit (INDEPENDENT_AMBULATORY_CARE_PROVIDER_SITE_OTHER): Payer: Medicare Other | Admitting: Psychiatry

## 2018-03-17 VITALS — HR 102

## 2018-03-17 DIAGNOSIS — F411 Generalized anxiety disorder: Secondary | ICD-10-CM | POA: Diagnosis not present

## 2018-03-17 NOTE — Patient Instructions (Addendum)
1. Start sertraline 25 mg daily for two week, then 50 mg daily  2. Continue Xanax 0.25 mg as needed for anxiety 3.Return to clinic in one month for 30 mins

## 2018-03-22 ENCOUNTER — Ambulatory Visit (HOSPITAL_COMMUNITY): Payer: Medicaid Other | Admitting: Psychiatry

## 2018-03-22 ENCOUNTER — Other Ambulatory Visit: Payer: Self-pay | Admitting: Family Medicine

## 2018-04-05 ENCOUNTER — Ambulatory Visit (INDEPENDENT_AMBULATORY_CARE_PROVIDER_SITE_OTHER): Payer: Medicare Other | Admitting: Psychiatry

## 2018-04-05 DIAGNOSIS — F411 Generalized anxiety disorder: Secondary | ICD-10-CM

## 2018-04-05 NOTE — Progress Notes (Signed)
   THERAPIST PROGRESS NOTE  Session Time: Tuesday 04/05/2018 1:30 PM -  2:12 PM     Participation Level: Active  Behavioral Response: CasualAlertAnxious  Type of Therapy: Individual Therapy     Treatment Goals addressed:  learn and implement cognitive and behavioral strategies to cope with anxiety  Interventions: CBT and Supportive  Summary: Janet Acevedo is a 27 y.o. female who is referred for services by psychiatrist Dr. Modesta Messing due to patient experiencing symptoms of anxiety. She denies any psychiatric hospitalizations and no previous involvement in psychotherapy.  patient reports having uncontrollable anxiety and finding herself in an anxious state every day. She reports having anxiety since childhood but has worsened in the last 1-2 years. She reports stress regarding an aunt who is a quadriplegic as her health has fluctuated. Patient reports being very close with his aunt. She also reports dealing with her sexuality and says she has had a girlfriend secretly for the past 8 years. She says she used to be okay with this but now being tired of hiding it. She also worries about her brother who is in prison and openly gay. Patient's reported symptoms of anxiety include "sweaty palms, hands tremble alot, voice is shakey, emotional outbursts of crying, increased heart rate".  Patient was last seen about 2 weeks ago.She reports increased stress and anxiety regarding school since last session. She had problems regarding accommodations as she had anticipated. She reports initially having panic like symptoms but being able to problem solve quickly and completing her day successfully. She also reports using assertiveness skills to express her concerns to disability office. She reports she felt overwhelmed with her course load and has dropped a course. This has alleviated some stress. Now patient is worried that the professor in the course she dropped may have negative thoughts about her resulting in  patient feeling anxious when she is near his class. She states being emotional and sensitive.  Patient reports she still has not begun taking medication due to fears of adverse effects although she has addressed concerns with Dr. Modesta Messing. Patient did not complete thought log as she reports she was overwhelmed with class.    Suicidal/Homicidal: Nowithout intent/plan  Therapist Response:  Praised and reinforced patient's problem solving skills regarding accommodations, praised and reinforced her use of assertiveness skills, discussed effects on her mood and behavior, discussed stressors, discussed stressors,  facilitated expression of thoughts and feelings, validated feelings, reviewed connection between thoughts/feelings/behavior using examples from patient's life, discussed thoughts and processes that interfered with completion of daily thought record, assisted patient practice completing daily thought record about recent stressors,  and assisted patient identify/challenge/and replace unhelpful thoughts with helpful thoughts, also used cognitive restructuring to replace unhelpful thought about taking medication with helpful thought, assigned patient to complete daily thought record and bring to next session   Plan: Return again in 2 weeks.  Diagnosis: Axis I: Generalized Anxiety Disorder    Axis II: Deferred    Merritt Kibby, LCSW 04/05/2018

## 2018-04-19 ENCOUNTER — Ambulatory Visit (INDEPENDENT_AMBULATORY_CARE_PROVIDER_SITE_OTHER): Payer: Medicare Other | Admitting: Psychiatry

## 2018-04-19 DIAGNOSIS — F411 Generalized anxiety disorder: Secondary | ICD-10-CM

## 2018-04-19 NOTE — Progress Notes (Signed)
   THERAPIST PROGRESS NOTE  Session Time: Tuesday 04/18/2018 1:10 PM - 2:05 PM     Participation Level: Active  Behavioral Response: CasualAlertAnxious  Type of Therapy: Individual Therapy     Treatment Goals addressed:  learn and implement cognitive and behavioral strategies to cope with anxiety  Interventions: CBT and Supportive  Summary: Janet Acevedo is a 27 y.o. female who is referred for services by psychiatrist Dr. Modesta Messing due to patient experiencing symptoms of anxiety. She denies any psychiatric hospitalizations and no previous involvement in psychotherapy.  patient reports having uncontrollable anxiety and finding herself in an anxious state every day. She reports having anxiety since childhood but has worsened in the last 1-2 years. She reports stress regarding an aunt who is a quadriplegic as her health has fluctuated. Patient reports being very close with his aunt. She also reports dealing with her sexuality and says she has had a girlfriend secretly for the past 8 years. She says she used to be okay with this but now being tired of hiding it. She also worries about her brother who is in prison and openly gay. Patient's reported symptoms of anxiety include "sweaty palms, hands tremble alot, voice is shakey, emotional outbursts of crying, increased heart rate".  Patient was last seen about 2 weeks ago.She reports anxiety level has been stable. She reports moments of anxiety along with heart racing and initially has difficulty identifying triggers. She eventually identifies this occurred when she was taking a break from doing homework for college. Patient reports having thoughts of always needing to do homework and feeling guilty for taking breaks. She reports she did use deep breathing to try to calm down and says this was helpful. She reports completing homework from session but forgetting to bring thought log to session. She is able to share some information from memory and cites  example of identifying, challenging, and replacing anxiety provoking thoughts about asking a classmate to be her partner on a project. Patient reports using thought log helped her reevaluate her thoughts and resulted in patient asking classmate about being partners. She is very pleased with her efforts.    Suicidal/Homicidal: Nowithout intent/plan  Therapist Response:  Reviewed symptoms, administered PHQ-9, reviewed treatment plan, praised and reinforced patient's use of thought log, discussed effects on mood and behavior, praised patient's behavior of asking classmate to be partners, reviewed cognitive model using handout and examples from patient's life, provided psychoeducation and discussed cognitive distortions, assisted patient identify her experiences with cognitive distortions, assigned patient to review handout provided in session, complete thought log once daily, also reviewed rationale for practicing deep breathing regularly, assigned patietn to practice daily   Plan: Return again in 2 weeks.  Diagnosis: Axis I: Generalized Anxiety Disorder    Axis II: Deferred    Jola Critzer, LCSW 04/19/2018

## 2018-04-21 ENCOUNTER — Ambulatory Visit (HOSPITAL_COMMUNITY): Payer: Self-pay | Admitting: Psychiatry

## 2018-05-03 ENCOUNTER — Ambulatory Visit (INDEPENDENT_AMBULATORY_CARE_PROVIDER_SITE_OTHER): Payer: Medicare Other | Admitting: Psychiatry

## 2018-05-03 DIAGNOSIS — F411 Generalized anxiety disorder: Secondary | ICD-10-CM

## 2018-05-03 NOTE — Progress Notes (Signed)
   THERAPIST PROGRESS NOTE  Session Time: Tuesday 05/03/2018 1:17 PM -  2:05 PM     Participation Level: Active  Behavioral Response: CasualAlertAnxious  Type of Therapy: Individual Therapy      Treatment Goals addressed:  learn and implement cognitive and behavioral strategies to cope with anxiety  Interventions: CBT and Supportive  Summary: Janet Acevedo is a 27 y.o. female who is referred for services by psychiatrist Dr. Modesta Messing due to patient experiencing symptoms of anxiety. She denies any psychiatric hospitalizations and no previous involvement in psychotherapy.  patient reports having uncontrollable anxiety and finding herself in an anxious state every day. She reports having anxiety since childhood but has worsened in the last 1-2 years. She reports stress regarding an aunt who is a quadriplegic as her health has fluctuated. Patient reports being very close with his aunt. She also reports dealing with her sexuality and says she has had a girlfriend secretly for the past 8 years. She says she used to be okay with this but now being tired of hiding it. She also worries about her brother who is in prison and openly gay. Patient's reported symptoms of anxiety include "sweaty palms, hands tremble alot, voice is shakey, emotional outbursts of crying, increased heart rate".  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 She reports not really having a schedule to balance her responsibilities in college with positive self-care/relaxation/and time with family. She states diving into work as she reports thoughts of "If I am not studying, I am not being productive". She reports she did not complete homework for session as she has not had time. Patient reports poor self-care regarding eating and sleeping patterns.    Suicidal/Homicidal: Nowithout intent/plan  Therapist Response:  Reviewed symptoms, discussed and addressed  thoughts and processes that inhibited completion of homework, discussed prevention and intervention as ways to manage anxiety, discussed using a schedule to balance responsibilities and self-care, provided patient with blank calendars and assisted patient begin to develop schedule to balance time, assigned patient to complete and implement schedule, assisted patient identify/challenge/and replace negative thoughts about not studying with helpful rational thought, assigned patient to read statement daily  Plan: Return again in 2 weeks.  Diagnosis: Axis I: Generalized Anxiety Disorder    Axis II: Deferred    BYNUM,PEGGY, LCSW 05/03/2018

## 2018-05-10 ENCOUNTER — Ambulatory Visit (INDEPENDENT_AMBULATORY_CARE_PROVIDER_SITE_OTHER): Payer: Medicare Other | Admitting: Family Medicine

## 2018-05-10 ENCOUNTER — Encounter: Payer: Self-pay | Admitting: Family Medicine

## 2018-05-10 VITALS — BP 120/80 | HR 62 | Resp 16 | Ht 66.0 in

## 2018-05-10 DIAGNOSIS — E785 Hyperlipidemia, unspecified: Secondary | ICD-10-CM | POA: Diagnosis not present

## 2018-05-10 DIAGNOSIS — Z23 Encounter for immunization: Secondary | ICD-10-CM | POA: Diagnosis not present

## 2018-05-10 DIAGNOSIS — E559 Vitamin D deficiency, unspecified: Secondary | ICD-10-CM

## 2018-05-10 DIAGNOSIS — F411 Generalized anxiety disorder: Secondary | ICD-10-CM | POA: Diagnosis not present

## 2018-05-10 DIAGNOSIS — J4540 Moderate persistent asthma, uncomplicated: Secondary | ICD-10-CM | POA: Diagnosis not present

## 2018-05-10 DIAGNOSIS — E6609 Other obesity due to excess calories: Secondary | ICD-10-CM | POA: Diagnosis not present

## 2018-05-10 DIAGNOSIS — G121 Other inherited spinal muscular atrophy: Secondary | ICD-10-CM | POA: Diagnosis not present

## 2018-05-10 NOTE — Patient Instructions (Signed)
Wellness with nurse mid December  MD follow up in March, call if you need me before  Fasting lipid, and vit D  Flu vaccine today  Thankful that therapy is being such a Higher education careers adviser

## 2018-05-16 NOTE — Progress Notes (Deleted)
[place] MD/PA/NP OP Progress Note  05/16/2018 12:35 PM SENITA CORREDOR  MRN:  [id number]  Chief Complaint:  HPI: *** Visit Diagnosis: No diagnosis found.  Past Psychiatric History: Please see initial evaluation for full details. I have reviewed the history. No updates at this time.     Past Medical History:  Past Medical History:  Diagnosis Date  . Allergy   . Anxiety   . Asthma   . Depression, major, single episode, moderate ([place]) 09/05/2017  . GERD (gastroesophageal reflux disease)   . Neuromuscular disorder ([place])   . Osteomyelitis [facility]) June 2011  . Perennial allergic rhinitis   . Seborrheic dermatitis of scalp 2006  . Spinal muscle atrophy ([place])    type 2/3 18 months    Past Surgical History:  Procedure Laterality Date  . bilateral hip reinsertion  under age 23   hips out of socket, pt was only able to cruise hold on and move short distances   . BONE BIOPSY     spinal bone   . MIRENA PLACED 10/27/10    . SPINAL FUSION  1999   rods placed in thoracolumbar spine to excessive scoliosis primarily     Family Psychiatric History: Please see initial evaluation for full details. I have reviewed the history. No updates at this time.     Family History:  Family History  Problem Relation Age of Onset  . Hyperlipidemia Mother   . Hypertension Mother   . Hypertension Brother   . Diabetes Brother        borderline   . Anxiety disorder Brother   . Depression Brother   . Drug abuse Brother     Social History:  Social History   Socioeconomic History  . Marital status: Single    Spouse name: Not on file  . Number of children: Not on file  . Years of education: Not on file  . Highest education level: Associate degree: academic program  Occupational History  . Occupation: Ship broker  Social Needs  . Financial resource strain: Not hard at all  . Food insecurity:    Worry: Never true    Inability: Never true  . Transportation needs:    Medical: No    Non-medical: No   Tobacco Use  . Smoking status: Never Smoker  . Smokeless tobacco: Never Used  Substance and Sexual Activity  . Alcohol use: No  . Drug use: No  . Sexual activity: Never  Lifestyle  . Physical activity:    Days per week: 0 days    Minutes per session: 0 min  . Stress: Not at all  Relationships  . Social connections:    Talks on phone: More than three times a week    Gets together: More than three times a week    Attends religious service: More than 4 times per year    Active member of club or organization: No    Attends meetings of clubs or organizations: Never    Relationship status: Not on file  Other Topics Concern  . Not on file  Social History Narrative  . Not on file    Allergies:  Allergies  Allergen Reactions  . Ciprofloxacin Nausea And Vomiting    Metabolic Disorder Labs: Lab Results  Component Value Date   HGBA1C 4.7 01/06/2018   MPG 88 01/06/2018   MPG 100 02/08/2017   No results found for: PROLACTIN Lab Results  Component Value Date   CHOL 184 01/06/2018   TRIG 68 01/06/2018  HDL 37 (L) 01/06/2018   CHOLHDL 5.0 (H) 01/06/2018   VLDL 12 02/08/2017   LDLCALC 131 (H) 01/06/2018   LDLCALC 127 (H) 02/08/2017   Lab Results  Component Value Date   TSH 1.06 01/06/2018   TSH 1.393 02/08/2017    Therapeutic Level Labs: No results found for: LITHIUM No results found for: VALPROATE No components found for:  CBMZ  Current Medications: Current Outpatient Medications  Medication Sig Dispense Refill  . albuterol (ACCUNEB) 1.25 MG/3ML nebulizer solution TAKE 3 MLS (1.25 MG TOTAL) BY NEBULIZATION EVERY 4 (FOUR) HOURS AS NEEDED. DX J45.40 75 mL 1  . albuterol (VENTOLIN HFA) 108 (90 Base) MCG/ACT inhaler INHALE 2 PUFFS INTO THE LUNGS EVERY 4 (FOUR) HOURS AS NEEDED FOR WHEEZING OR SHORTNESS OF BREATH. 18 Inhaler 0  . ALPRAZolam (XANAX) 0.25 MG tablet Take 1 tablet (0.25 mg total) by mouth 2 (two) times daily as needed for anxiety. 30 tablet 1  .  betamethasone dipropionate (DIPROLENE) 0.05 % cream APPLY TOPICALLY 2 (TWO) TIMES DAILY. 45 g 3  . Biotin (BIOTIN MAXIMUM STRENGTH) 10 MG TABS Take 1 tablet by mouth daily.    . cromolyn (OPTICROM) 4 % ophthalmic solution PLACE 1 DROP INTO BOTH EYES FOUR TIMES DAILY 30 mL 11  . ergocalciferol (VITAMIN D2) 50000 units capsule Take 1 capsule (50,000 Units total) by mouth once a week. One capsule once weekly 12 capsule 3  . fluconazole (DIFLUCAN) 150 MG tablet ONE TABLET ONCE DAILY, AS NEEDED, FOR VAGINAL ITCH ASSOCIATED WITH ANTIBIOTIC USE 2 tablet 0  . Fluocinolone Acetonide Scalp 0.01 % OIL Apply 1 application topically as needed. 118 mL 2  . ketoconazole (NIZORAL) 2 % cream APPLY 1 APPLICATION TOPICALLY AS NEEDED FOR DERMATITIS 15 g 3  . levocetirizine (XYZAL) 5 MG tablet TAKE 1 TABLET (5 MG TOTAL) BY MOUTH EVERY EVENING. 90 tablet 3  . montelukast (SINGULAIR) 10 MG tablet TAKE 1 TABLET BY MOUTH EVERY DAY 90 tablet 1  . nystatin (MYCOSTATIN/NYSTOP) powder APPLY TWICE DAILY TO AFFECTED AREA AS NEEDED 60 g 3  . omeprazole (PRILOSEC) 40 MG capsule One capsule twice daily effective 07/2017  Per GI at [facility] 30 capsule 3  . Spacer/Aero-Holding Chambers (AEROCHAMBER PLUS) inhaler Use as instructed 1 each 2  . SYMBICORT 160-4.5 MCG/ACT inhaler TAKE 2 PUFFS BY MOUTH TWICE A DAY 30.6 Inhaler 0  . UNABLE TO FIND Gloves- 1 box per month as needed 200 each prn  . UNABLE TO FIND Under pads- for use daily as needed 100 each prn   No current facility-administered medications for this visit.      Musculoskeletal: Strength & Muscle Tone: within normal limits Gait & Station: normal Patient leans: N/A  Psychiatric Specialty Exam: ROS  There were no vitals taken for this visit.There is no height or weight on file to calculate BMI.  General Appearance: Fairly Groomed  Eye Contact:  Good  Speech:  Clear and Coherent  Volume:  Normal  Mood:  {BHH MOOD:22306}  Affect:  {Affect (PAA):22687}  Thought  Process:  Coherent  Orientation:  Full (Time, Place, and Person)  Thought Content: Logical   Suicidal Thoughts:  {ST/HT (PAA):22692}  Homicidal Thoughts:  {ST/HT (PAA):22692}  Memory:  Immediate;   Good  Judgement:  {Judgement (PAA):22694}  Insight:  {Insight (PAA):22695}  Psychomotor Activity:  Normal  Concentration:  Concentration: Good and Attention Span: Good  Recall:  Good  Fund of Knowledge: Good  Language: Good  Akathisia:  No  Handed:  Right  AIMS (if indicated): not done  Assets:  Communication Skills Desire for Improvement  ADL's:  Intact  Cognition: WNL  Sleep:  {BHH GOOD/FAIR/POOR:22877}   Screenings: GAD-7     Counselor from 04/19/2018 in [facility] Counselor from 03/01/2018 in [facility] Counselor from 02/01/2018 in [place] Office Visit from 09/02/2017 in [facility] Office Visit from 02/08/2017 in [facility]  Total GAD-7 Score  10  (Pended)   12  (Pended)   12  20  18     PHQ2-9     Office Visit from 05/10/2018 in [facility] Office Visit from 01/05/2018 in [facility] Office Visit from 09/02/2017 in [facility] from 07/12/2017 in [facility] Visit from 04/08/2017 in [facility]  PHQ-2 Total Score  0  0  4  0  0  PHQ-9 Total Score  -  -  13  -  -       Assessment and Plan:  JAE BRUCK is a 27 y.o. 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  # Generalized anxiety disorder # Social phobia   Patient continues to report anxiety and occasional neurovegetative symptoms.  Psychosocial stressors including relationship issues, and she also talks about her brother who is in prison.  She feels ambivalent  about starting sertraline with concern for potential side effect, although she is willing to try this weekend.  Provided psycho education about medication.  Discussed effective communication.  Discussed self compassion.  She will continue to see Ms. Bynum for therapy.   Plan  1. Start sertraline 25 mg daily for two week, then 50 mg daily  2. Continue Xanax 0.25 mg as needed for anxiety(prescribed by PCP) 3.Return to clinic in one month for 30 mins    The patient demonstrates the following risk factors for suicide: Chronic risk factors for suicide include:psychiatric disorder ofanxiety. Acute risk factorsfor suicide include: loss (financial, interpersonal, professional). Protective factorsfor this patient include: positive social support, coping skills and hope for the future. Considering these factors, the overall suicide risk at this point appears to below. Patientisappropriate for outpatient follow up.  Norman Clay, MD 05/16/2018, 12:35 PM

## 2018-05-17 ENCOUNTER — Ambulatory Visit (HOSPITAL_COMMUNITY): Payer: Medicaid Other | Admitting: Psychiatry

## 2018-05-26 ENCOUNTER — Encounter (HOSPITAL_COMMUNITY): Payer: Self-pay | Admitting: Psychiatry

## 2018-05-26 ENCOUNTER — Ambulatory Visit (INDEPENDENT_AMBULATORY_CARE_PROVIDER_SITE_OTHER): Payer: Medicare Other | Admitting: Psychiatry

## 2018-05-26 DIAGNOSIS — F411 Generalized anxiety disorder: Secondary | ICD-10-CM

## 2018-05-26 NOTE — Progress Notes (Signed)
   THERAPIST PROGRESS NOTE  Session Time: Thursday 05/26/2018 1:22 PM - 2:05 PM    Participation Level: Active  Behavioral Resp             onse: CasualAlertAnxious  Type of Therapy: Individual Therapy      Treatment Goals addressed:  learn and implement cognitive and behavioral strategies to cope with anxiety  Interventions: CBT and Supportive  Summary: Janet Acevedo is a 27 y.o. female who is referred for services by psychiatrist Dr. Modesta Messing due to patient experiencing symptoms of anxiety. She denies any psychiatric hospitalizations and no previous involvement in psychotherapy.  patient reports having uncontrollable anxiety and finding herself in an anxious state every day. She reports having anxiety since childhood but has worsened in the last 1-2 years. She reports stress regarding an aunt who is a quadriplegic as her health has fluctuated. Patient reports being very close with his aunt. She also reports dealing with her sexuality and says she has had a girlfriend secretly for the past 8 years. She says she used to be okay with this but now being tired of hiding it. She also worries about her brother who is in prison and openly gay. Patient's reported symptoms of anxiety include "sweaty palms, hands tremble alot, voice is shakey, emotional outbursts of crying, increased heart rate".  Patient was last seen about 2 weeks ago.She reports implementing schedule to create balance in her life and reports experiencing decreased anxiety and more enjoyment as a result. She reprots feeling much less guilt about having time for self and now having more rational thought about taking breaks from studying. She has continued to use thought log and reports it has been helopful in helping her identify and challenge her thoughts. 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    Suicidal/Homicidal: Nowithout intent/plan  Therapist Response:  Reviewed symptoms, praised in reinforced patient's implementation of schedule and creating more balance in her life, discussed effects on her mood/thoughts/and behavior, praise and reinforced patient's use of thought log, assisted patient identify/challenge/and replace negative thoughts about changing majors with helpful rational thoughts, discussed and provided patient with list of socratic questions to help patient identify/challenge/and replace unhelpful thoughts, assigned patient to continue using thought log daily  Plan: Return again in 2 weeks.  Diagnosis: Axis I: Generalized Anxiety Disorder    Axis II: Deferred    Ladelle Teodoro, LCSW 05/26/2018

## 2018-05-31 ENCOUNTER — Other Ambulatory Visit: Payer: Self-pay | Admitting: Family Medicine

## 2018-06-04 ENCOUNTER — Encounter: Payer: Self-pay | Admitting: Family Medicine

## 2018-06-04 NOTE — Progress Notes (Addendum)
   Janet Acevedo     MRN: [id number]      DOB: [birth year]   HPI Janet Acevedo is here for follow up and re-evaluation of chronic medical conditions, medication management and review of any available recent lab and radiology data.  Preventive health is updated, specifically  Cancer screening and Immunization.   Benefiting greatly from therapy and intends to continue The PT denies any adverse reactions to current medications since the last visit.  There are no new concerns. Currently enrolled in school and doing well There are no specific complaints   ROS Denies recent fever or chills. Denies sinus pressure, nasal congestion, ear pain or sore throat. Denies chest congestion, productive cough or wheezing. Denies chest pains, palpitations and leg swelling Denies abdominal pain, nausea, vomiting,diarrhea or constipation.   Denies dysuria, frequency, hesitancy or incontinence. Chronic  limitation in mobility. Denies headaches, seizures, numbness, or tingling. Denies depression, anxiety or insomnia. Denies skin break down or rash.   PE BP 120/80   Pulse 62   Resp 16   Ht 5\' 6"  (1.676 m)   SpO2 100%   BMI 24.53 kg/m    Patient alert and oriented and in no cardiopulmonary distress.  HEENT: No facial asymmetry, EOMI,   oropharynx pink and moist.  Neck decreased ROM, no JVD, no mass.  Chest: Clear to auscultation bilaterally.  CVS: S1, S2 no murmurs, no S3.Regular rate.  ABD: Soft non tender.   Ext: No edema  MS: decreased ROM spine, shoulders, hips and knees.  Skin: Intact, no ulcerations or rash noted.  Psych: Good eye contact, normal affect. Memory intact not anxious or depressed appearing.  CNS: CN 2-12 intact, power,  normal throughout.no focal deficits noted.   Assessment & Plan  Type II spinal muscular atrophy (HCC) Chronic quadriplegia unchanged.   Obesity  Patient re-educated about  the importance of commitment to a  minimum of 150 minutes of exercise per week.   The importance of healthy food choices with portion control discussed. Encouraged to start a food diary, count calories and to consider  joining a support group. Sample diet sheets offered. Goals set by the patient for the next several months.   Weight /BMI 09/02/2017 04/08/2017 01/20/2017  WEIGHT 152 lb - -  HEIGHT 5\' 6"  - 5\' 7"   BMI 24.53 kg/m2 - -  Some encounter information is confidential and restricted. Go to Review Flowsheets activity to see all data.      Moderate persistent asthma No recent flare, continue chronic meds  Dyslipidemia Hyperlipidemia:Low fat diet discussed and encouraged.   Lipid Panel  Lab Results  Component Value Date   CHOL 184 01/06/2018   HDL 37 (L) 01/06/2018   LDLCALC 131 (H) 01/06/2018   TRIG 68 01/06/2018   CHOLHDL 5.0 (H) 01/06/2018     Uncontrol, needs to re due fat intake Updated lab needed at/ before next visit.   Anxiety disorder Improved, doing very well with therapy

## 2018-06-04 NOTE — Assessment & Plan Note (Signed)
Hyperlipidemia:Low fat diet discussed and encouraged.   Lipid Panel  Lab Results  Component Value Date   CHOL 184 01/06/2018   HDL 37 (L) 01/06/2018   LDLCALC 131 (H) 01/06/2018   TRIG 68 01/06/2018   CHOLHDL 5.0 (H) 01/06/2018     Uncontrol, needs to re due fat intake Updated lab needed at/ before next visit.

## 2018-06-04 NOTE — Assessment & Plan Note (Signed)
Chronic quadriplegia unchanged ?

## 2018-06-04 NOTE — Assessment & Plan Note (Addendum)
  Patient re-educated about  the importance of commitment to a  minimum of 150 minutes of exercise per week.  The importance of healthy food choices with portion control discussed. Encouraged to start a food diary, count calories and to consider  joining a support group. Sample diet sheets offered. Goals set by the patient for the next several months.   Weight /BMI 09/02/2017 04/08/2017 01/20/2017  WEIGHT 152 lb - -  HEIGHT 5\' 6"  - 5\' 7"   BMI 24.53 kg/m2 - -  Some encounter information is confidential and restricted. Go to Review Flowsheets activity to see all data.

## 2018-06-04 NOTE — Assessment & Plan Note (Signed)
No recent flare, continue chronic meds

## 2018-06-04 NOTE — Assessment & Plan Note (Signed)
Improved, doing very well with therapy

## 2018-06-07 ENCOUNTER — Ambulatory Visit (HOSPITAL_COMMUNITY): Payer: Self-pay | Admitting: Psychiatry

## 2018-06-11 ENCOUNTER — Other Ambulatory Visit: Payer: Self-pay | Admitting: Family Medicine

## 2018-06-21 ENCOUNTER — Encounter: Payer: Self-pay | Admitting: Family Medicine

## 2018-06-21 ENCOUNTER — Ambulatory Visit (HOSPITAL_COMMUNITY): Payer: Self-pay | Admitting: Psychiatry

## 2018-06-26 ENCOUNTER — Other Ambulatory Visit: Payer: Self-pay | Admitting: Family Medicine

## 2018-06-26 MED ORDER — OSELTAMIVIR PHOSPHATE 75 MG PO CAPS: 75.0000 mg | ORAL_CAPSULE | Freq: Two times a day (BID) | ORAL | 0 refills | Status: AC

## 2018-07-05 ENCOUNTER — Ambulatory Visit (HOSPITAL_COMMUNITY): Payer: Self-pay | Admitting: Psychiatry

## 2018-07-12 ENCOUNTER — Ambulatory Visit: Payer: Self-pay

## 2018-07-14 ENCOUNTER — Ambulatory Visit: Payer: Self-pay

## 2018-07-21 ENCOUNTER — Encounter: Payer: Self-pay | Admitting: Family Medicine

## 2018-07-25 ENCOUNTER — Telehealth: Payer: Self-pay

## 2018-07-25 ENCOUNTER — Telehealth: Payer: Self-pay | Admitting: *Deleted

## 2018-07-25 DIAGNOSIS — J4540 Moderate persistent asthma, uncomplicated: Secondary | ICD-10-CM

## 2018-07-25 MED ORDER — NEBULIZER/TUBING/MOUTHPIECE KIT: PACK | 0 refills | Status: DC

## 2018-07-25 MED ORDER — NEBULIZER/ADULT MASK KIT: PACK | 0 refills | Status: DC

## 2018-07-25 NOTE — Telephone Encounter (Signed)
Supplies sent in  

## 2018-07-25 NOTE — Telephone Encounter (Signed)
Refills for nebulizer supplies sent to [facility] sent in

## 2018-07-25 NOTE — Telephone Encounter (Signed)
Pt needs prescription sent to [name] for a [employer]. Hers is no longer working.

## 2018-07-28 ENCOUNTER — Encounter: Payer: Self-pay | Admitting: Family Medicine

## 2018-07-29 ENCOUNTER — Other Ambulatory Visit: Payer: Self-pay | Admitting: Family Medicine

## 2018-07-29 IMAGING — DX DG CHEST 2V
2 series · 2 of 2 positions shown · non-contrast
Comparison: 08/21/2013

CLINICAL DATA: Productive cough with purulent sputum, pt stated she
had the flu and has worsened, cough, sputum, chills x 1 week- pt is
in specialized wheelchair, muscular atrophy

EXAM:
CHEST  2 VIEW

[chest lat]
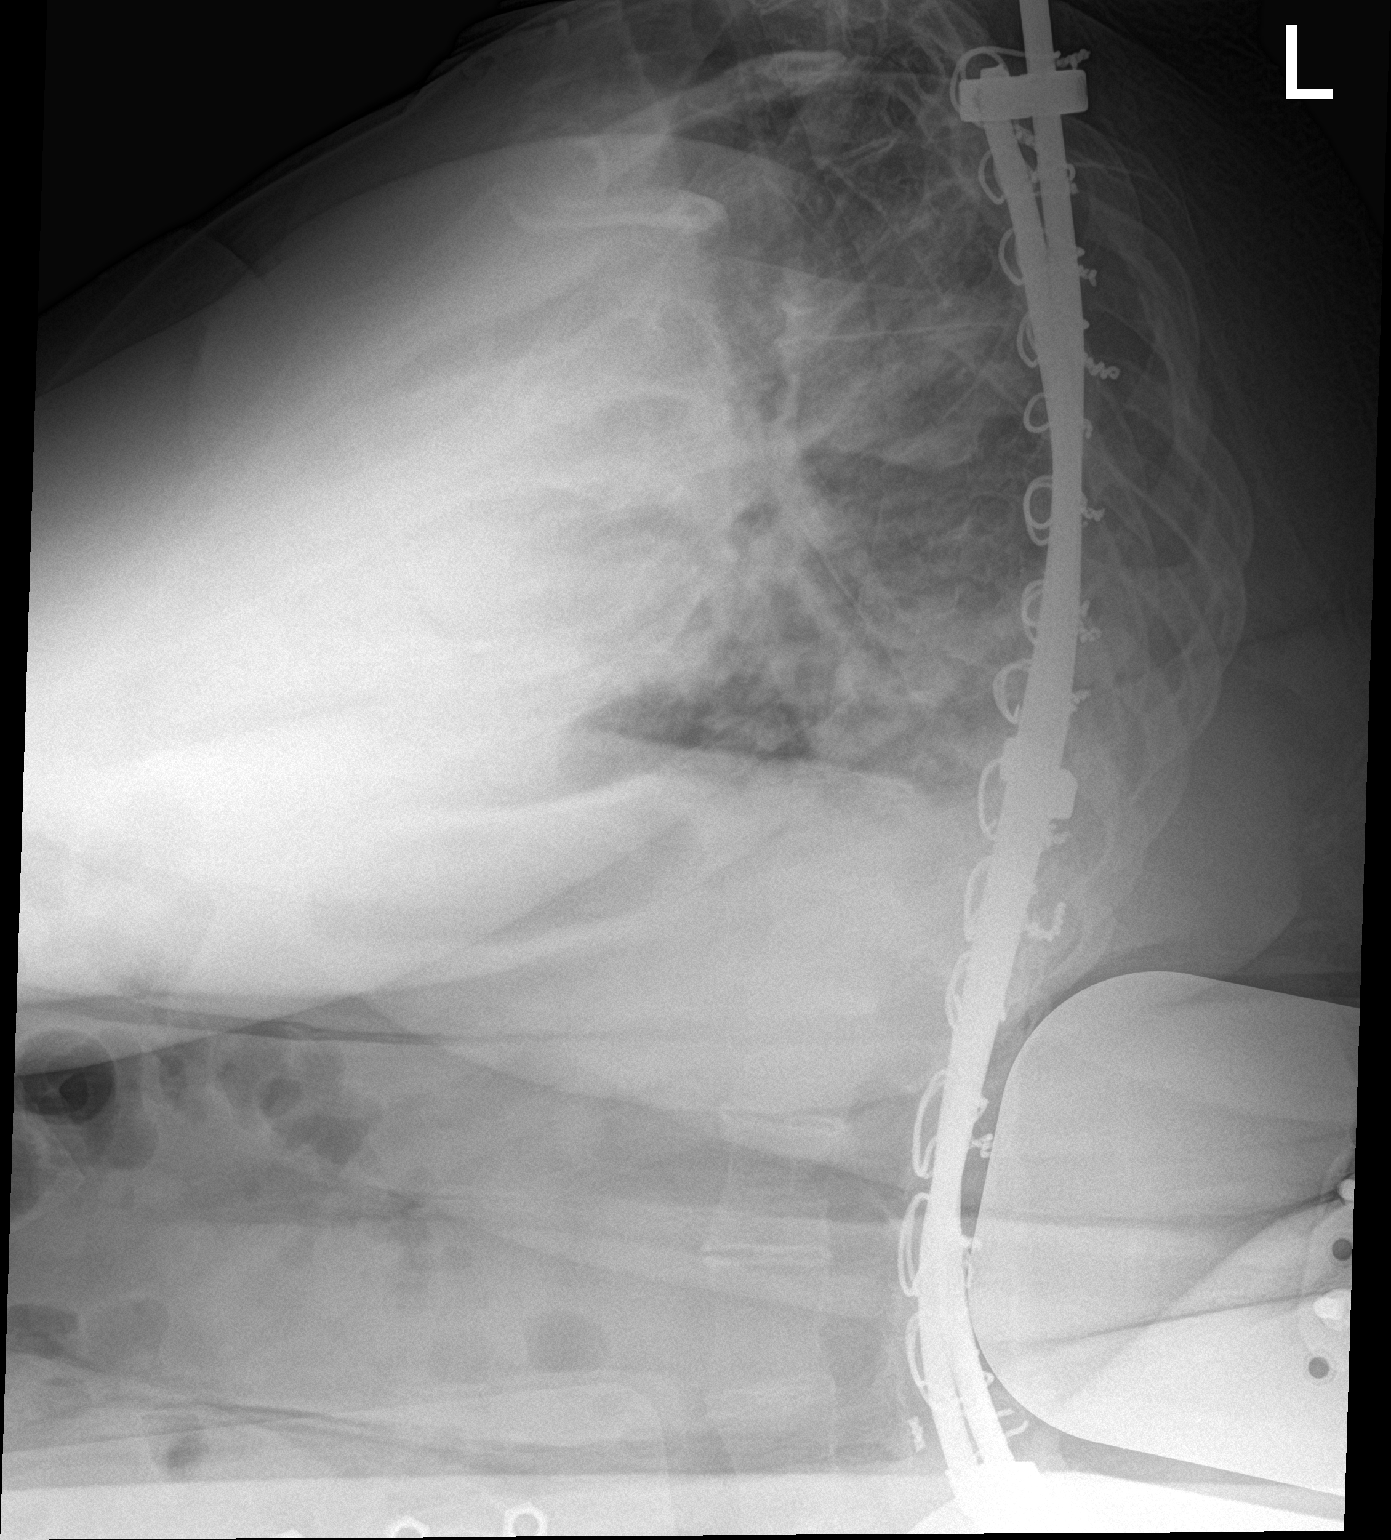

[chest ap]
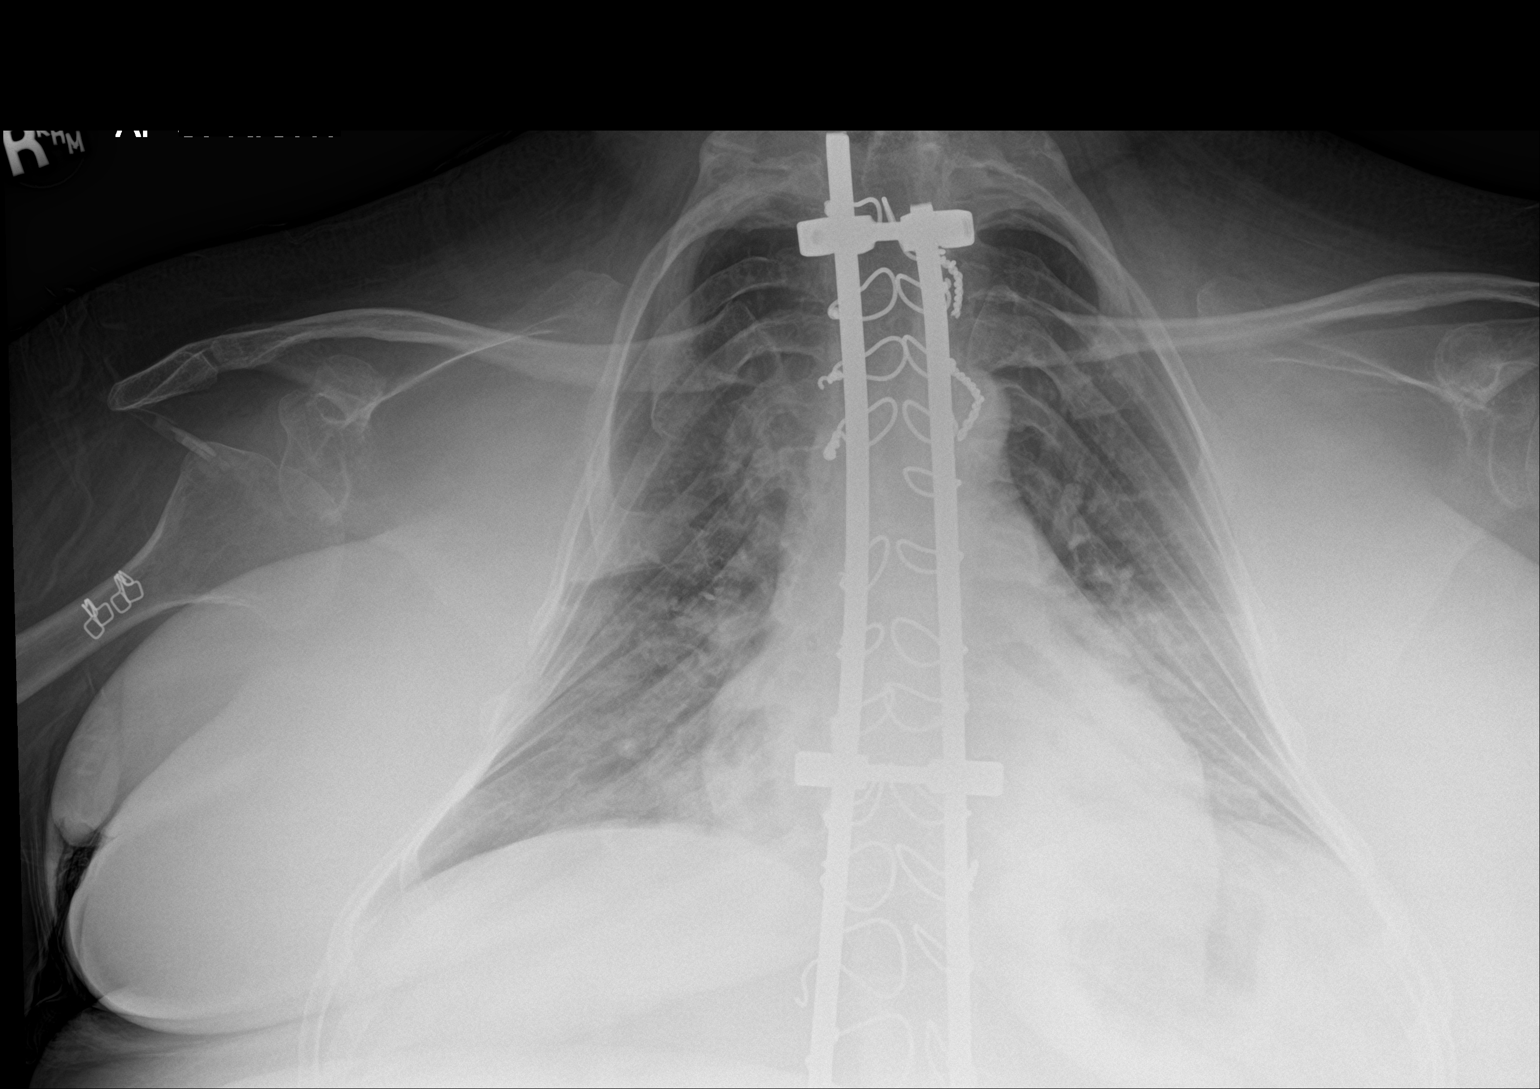

[2 of 2 positions shown; findings below may reference images not displayed]

FINDINGS: Cardiac silhouette is enlarged but stable. No mediastinal or hilar
masses. No convincing adenopathy.

There is hazy airspace opacity in both lower lungs projecting in the
lower lobes on the lateral view, left greater than right. Remainder
of the lungs is clear. No convincing pleural effusion. No
pneumothorax.

There is Weber-Doffing Kinderreglih thorax. Spine stabilization rods extend from
the upper thoracic spine to the lumbar spine, below the included
field of view.
IMPRESSION: 1. Hazy, bilateral lower lobe, airspace opacities consistent with
pneumonia.

## 2018-07-29 MED ORDER — AZITHROMYCIN 250 MG PO TABS: ORAL_TABLET | ORAL | 0 refills | Status: DC

## 2018-07-29 MED ORDER — BENZONATATE 100 MG PO CAPS: 100.0000 mg | ORAL_CAPSULE | Freq: Two times a day (BID) | ORAL | 0 refills | Status: DC | PRN

## 2018-07-29 MED ORDER — FLUCONAZOLE 150 MG PO TABS: ORAL_TABLET | ORAL | 0 refills | Status: DC

## 2018-08-11 ENCOUNTER — Telehealth: Payer: Self-pay | Admitting: Family Medicine

## 2018-08-11 ENCOUNTER — Ambulatory Visit (INDEPENDENT_AMBULATORY_CARE_PROVIDER_SITE_OTHER): Payer: Medicare Other | Admitting: Psychiatry

## 2018-08-11 ENCOUNTER — Encounter (HOSPITAL_COMMUNITY): Payer: Self-pay | Admitting: Psychiatry

## 2018-08-11 DIAGNOSIS — F411 Generalized anxiety disorder: Secondary | ICD-10-CM

## 2018-08-11 NOTE — Progress Notes (Signed)
   THERAPIST PROGRESS NOTE  Session Time: Thursday 08/11/2018 1:20 PM -2:10 PM      Participation Level: Active  Behavioral Response: CasualAlertAnxious  Type of Therapy: Individual Therapy      Treatment Goals addressed:  learn and implement cognitive and behavioral strategies to cope with anxiety  Interventions: CBT and Supportive  Summary: Janet Acevedo is a 27 y.o. female who is referred for services by psychiatrist Dr. Modesta Messing due to patient experiencing symptoms of anxiety. She denies any psychiatric hospitalizations and no previous involvement in psychotherapy.  patient reports having uncontrollable anxiety and finding herself in an anxious state every day. She reports having anxiety since childhood but has worsened in the last 1-2 years. She reports stress regarding an aunt who is a quadriplegic as her health has fluctuated. Patient reports being very close with his aunt. She also reports dealing with her sexuality and says she has had a girlfriend secretly for the past 8 years. She says she used to be okay with this but now being tired of hiding it. She also worries about her brother who is in prison and openly gay. Patient's reported symptoms of anxiety include "sweaty palms, hands tremble alot, voice is shakey, emotional outbursts of crying, increased heart rate".  Patient was last seen in October 2019. She reports health issues and schedule conflict prevented her from attending earlier appointments. She successfully completed last school semester but is not in school this semester. Per her report, she was not able to complete paperwork for financial aid in time. She expresses ambivalent feelings about not being in school this semester. She reports having thoughts of not being productive since she is not in school but being able to challenge and replace with more helpful thought with use of thought log. She reports planning to look for work from home job and research possible major  change/ possible career path while out of school. She is completing paperwork and hopes to resume school in the summer or next fall. She is having difficulty creating and implementing schedule now that she is out of school. Patient reports coping with relationship with mother and partner better than she has in the past. She expresses worry about her sibling as he is transitioning from female to female and is in prison. She just recently was informed about sibling transitioning.      Suicidal/Homicidal: Nowithout intent/plan  Therapist Response:  Reviewed symptoms, praised and congratulated patient on successfully completing last semester, praised and reinforced patient's increase awareness of thoughts and her efforts identify/challlenging/replacing unhelpful thoughts, discussed effects, assisted patient identify way to improve daily structure/routine with use of daily planning, discussed stressors, facilitated expression of thoughts and feelings, validated feelings, discussed acceptance of uncertainty  Plan: Return again in 2 weeks.  Diagnosis: Axis I: Generalized Anxiety Disorder    Axis II: Deferred    [name], LCSW 08/11/2018

## 2018-08-11 NOTE — Telephone Encounter (Signed)
[employer] forms Copied Noted  sleeved

## 2018-08-15 DIAGNOSIS — J4540 Moderate persistent asthma, uncomplicated: Secondary | ICD-10-CM

## 2018-08-15 DIAGNOSIS — F411 Generalized anxiety disorder: Secondary | ICD-10-CM

## 2018-08-15 DIAGNOSIS — E785 Hyperlipidemia, unspecified: Secondary | ICD-10-CM

## 2018-08-17 ENCOUNTER — Encounter: Payer: Self-pay | Admitting: Family Medicine

## 2018-08-25 ENCOUNTER — Ambulatory Visit (INDEPENDENT_AMBULATORY_CARE_PROVIDER_SITE_OTHER): Payer: Medicare Other | Admitting: Psychiatry

## 2018-08-25 ENCOUNTER — Encounter (HOSPITAL_COMMUNITY): Payer: Self-pay | Admitting: Psychiatry

## 2018-08-25 DIAGNOSIS — F411 Generalized anxiety disorder: Secondary | ICD-10-CM

## 2018-08-25 NOTE — Progress Notes (Signed)
'        THERAPIST PROGRESS NOTE  Session Time: Thursday 08/25/2018 1:10 PM - 2:00 PM    Participation Level: Active  Behavioral Response: CasualAlertAnxious  Type of Therapy: Individual Therapy      Treatment Goals addressed:  learn and implement cognitive and behavioral strategies to cope with anxiety  Interventions: CBT and Supportive  Summary: Janet Acevedo is a 28 y.o. female who is referred for services by psychiatrist Dr. Modesta Messing due to patient experiencing symptoms of anxiety. She denies any psychiatric hospitalizations and no previous involvement in psychotherapy.  patient reports having uncontrollable anxiety and finding herself in an anxious state every day. She reports having anxiety since childhood but has worsened in the last 1-2 years. She reports stress regarding an aunt who is a quadriplegic as her health has fluctuated. Patient reports being very close with his aunt. She also reports dealing with her sexuality and says she has had a girlfriend secretly for the past 8 years. She says she used to be okay with this but now being tired of hiding it. She also worries about her brother who is in prison and openly gay. Patient's reported symptoms of anxiety include "sweaty palms, hands tremble alot, voice is shakey, emotional outbursts of crying, increased heart rate".  Patient was last seen about two weeks ago. She reports less worry about sibling as she recently talked to her and she sounds like she is happy per patient's report. Patient has made some improvement in daily structure/routine but reports still not being productive as she would like. She expresses frustration with self for not doing more regarding settling financial issues for school and planning for reentry. She admits feeling overwhelmed and anxious with the process. She reports spending 4-5 hours playing video games and then being critical of self for not accomplishing tasks.    Suicidal/Homicidal: Nowithout  intent/plan  Therapist Response:  Reviewed symptoms,discussed stressors, facilitated expression of thoughts and feelings, validated feelings, praised and congratulated efforts to develop improve structure routine, assisted patient identify how she is spending her time, assisted patient identify her pattern of behavior in response to stress and anxiety about settling issues for school , discussed how avoidance and procrastination is contributing to anxiety regarding this, assisted patient identify strategies to avoid procrastination Plan: Return again in 2 weeks.  Diagnosis: Axis I: Generalized Anxiety Disorder    Axis II: Deferred    [name], LCSW 08/25/2018

## 2018-08-27 ENCOUNTER — Other Ambulatory Visit: Payer: Self-pay | Admitting: Family Medicine

## 2018-08-28 ENCOUNTER — Other Ambulatory Visit: Payer: Self-pay | Admitting: Family Medicine

## 2018-09-05 ENCOUNTER — Other Ambulatory Visit: Payer: Self-pay

## 2018-09-05 MED ORDER — UNABLE TO FIND: 99 refills | Status: AC

## 2018-09-05 NOTE — Progress Notes (Signed)
Under pads and gloves are reordered

## 2018-09-12 ENCOUNTER — Ambulatory Visit (HOSPITAL_COMMUNITY): Payer: Medicare Other | Admitting: Psychiatry

## 2018-09-12 DIAGNOSIS — J4540 Moderate persistent asthma, uncomplicated: Secondary | ICD-10-CM | POA: Diagnosis not present

## 2018-09-12 DIAGNOSIS — Z7951 Long term (current) use of inhaled steroids: Secondary | ICD-10-CM | POA: Diagnosis not present

## 2018-09-12 DIAGNOSIS — J984 Other disorders of lung: Secondary | ICD-10-CM | POA: Diagnosis not present

## 2018-09-12 DIAGNOSIS — Z79899 Other long term (current) drug therapy: Secondary | ICD-10-CM | POA: Diagnosis not present

## 2018-09-12 DIAGNOSIS — R05 Cough: Secondary | ICD-10-CM | POA: Diagnosis not present

## 2018-09-12 DIAGNOSIS — G121 Other inherited spinal muscular atrophy: Secondary | ICD-10-CM | POA: Diagnosis not present

## 2018-09-14 IMAGING — DX DG ABDOMEN ACUTE W/ 1V CHEST
4 series · 4 of 4 positions shown · non-contrast
Comparison: Chest x-ray of 09/19/2016

CLINICAL DATA: Intermittent right lower quadrant pain over the last
month

EXAM:
DG ABDOMEN ACUTE W/ 1V CHEST

[abdomen erect]
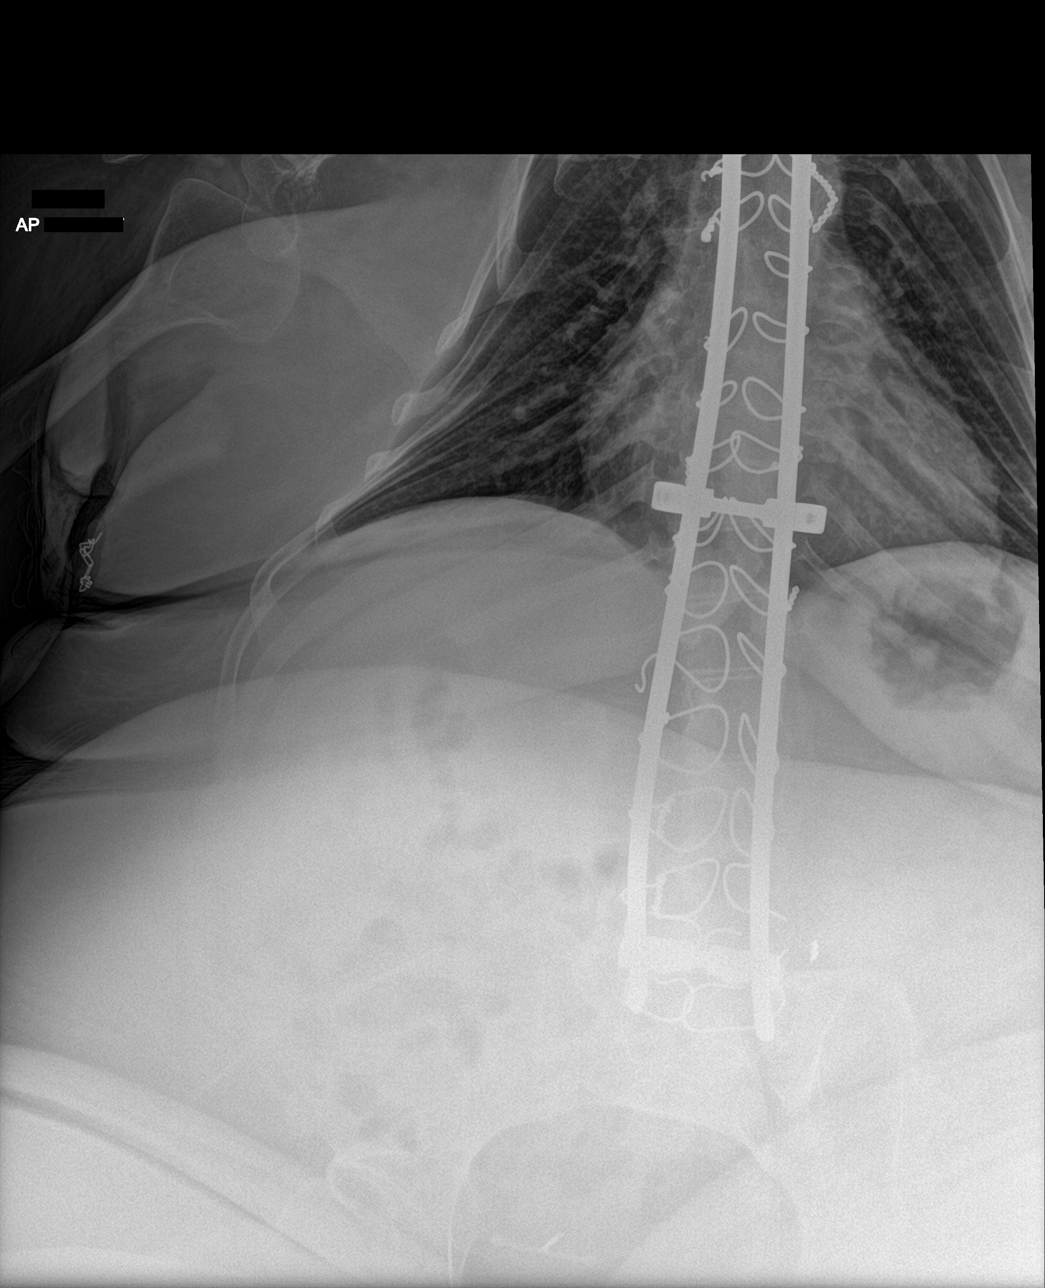

[abdomen supine (1 of 2)]
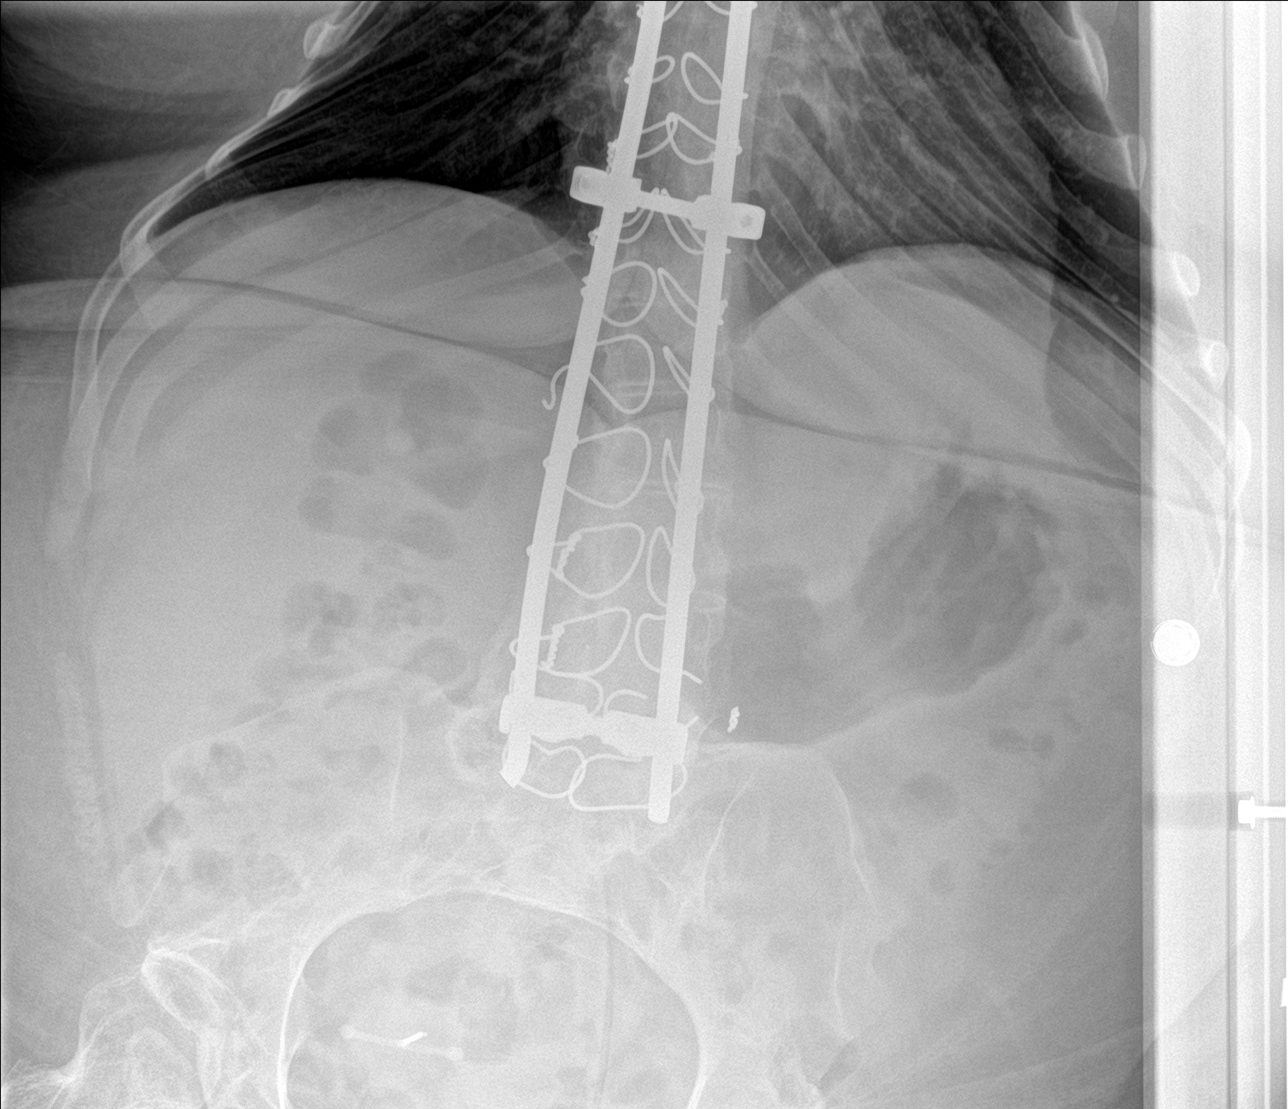

[abdomen supine (2 of 2)]
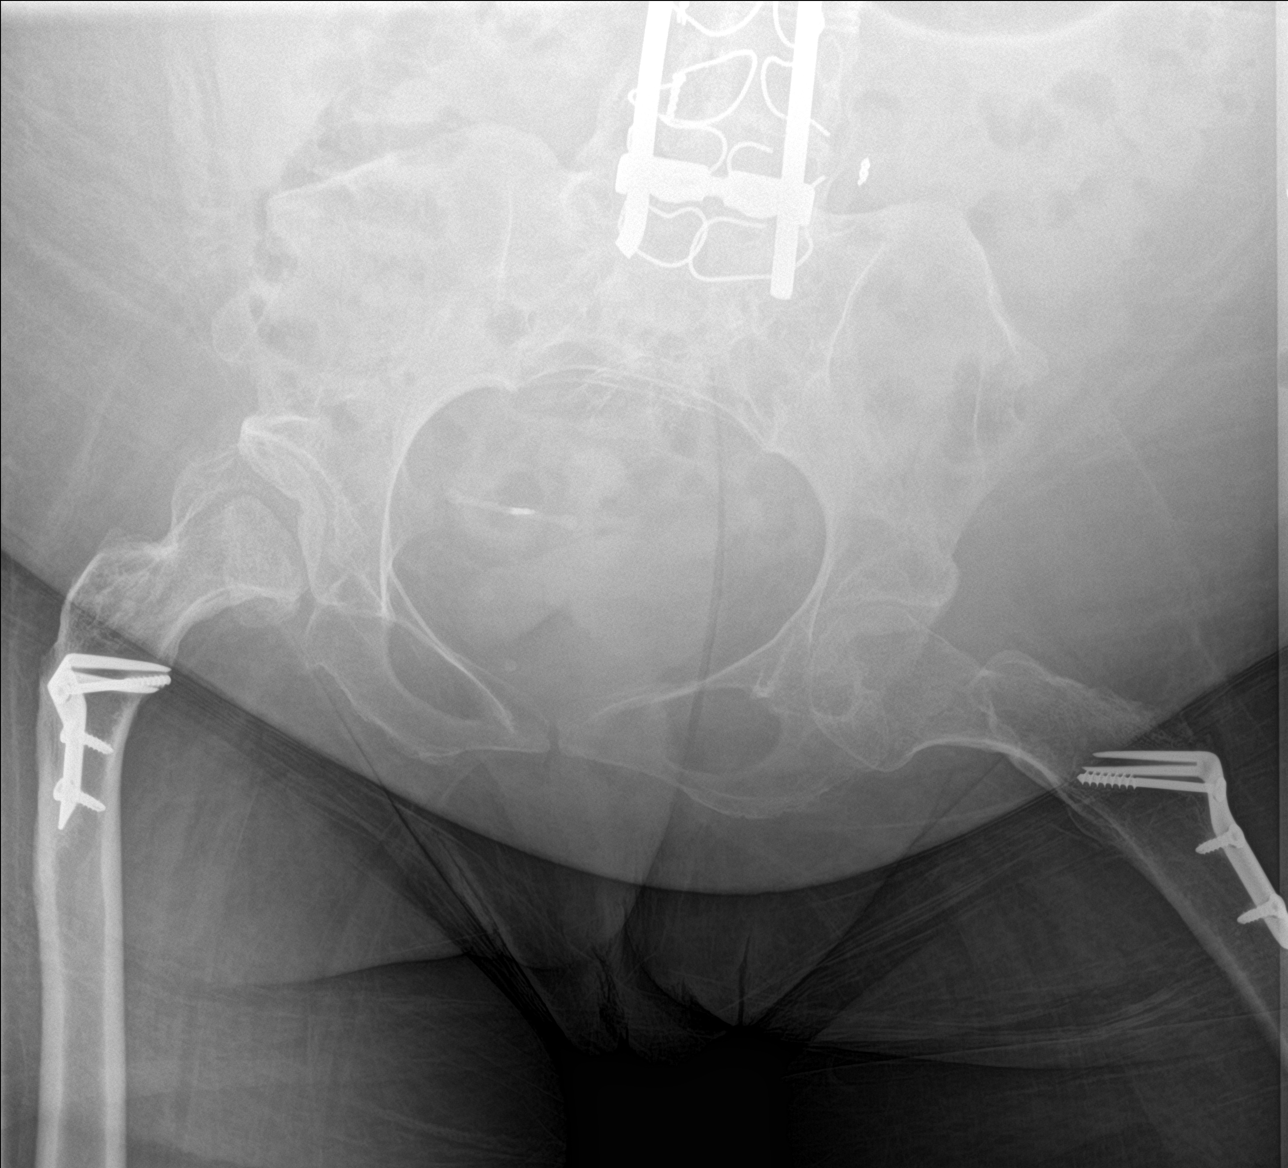

[chest ap]
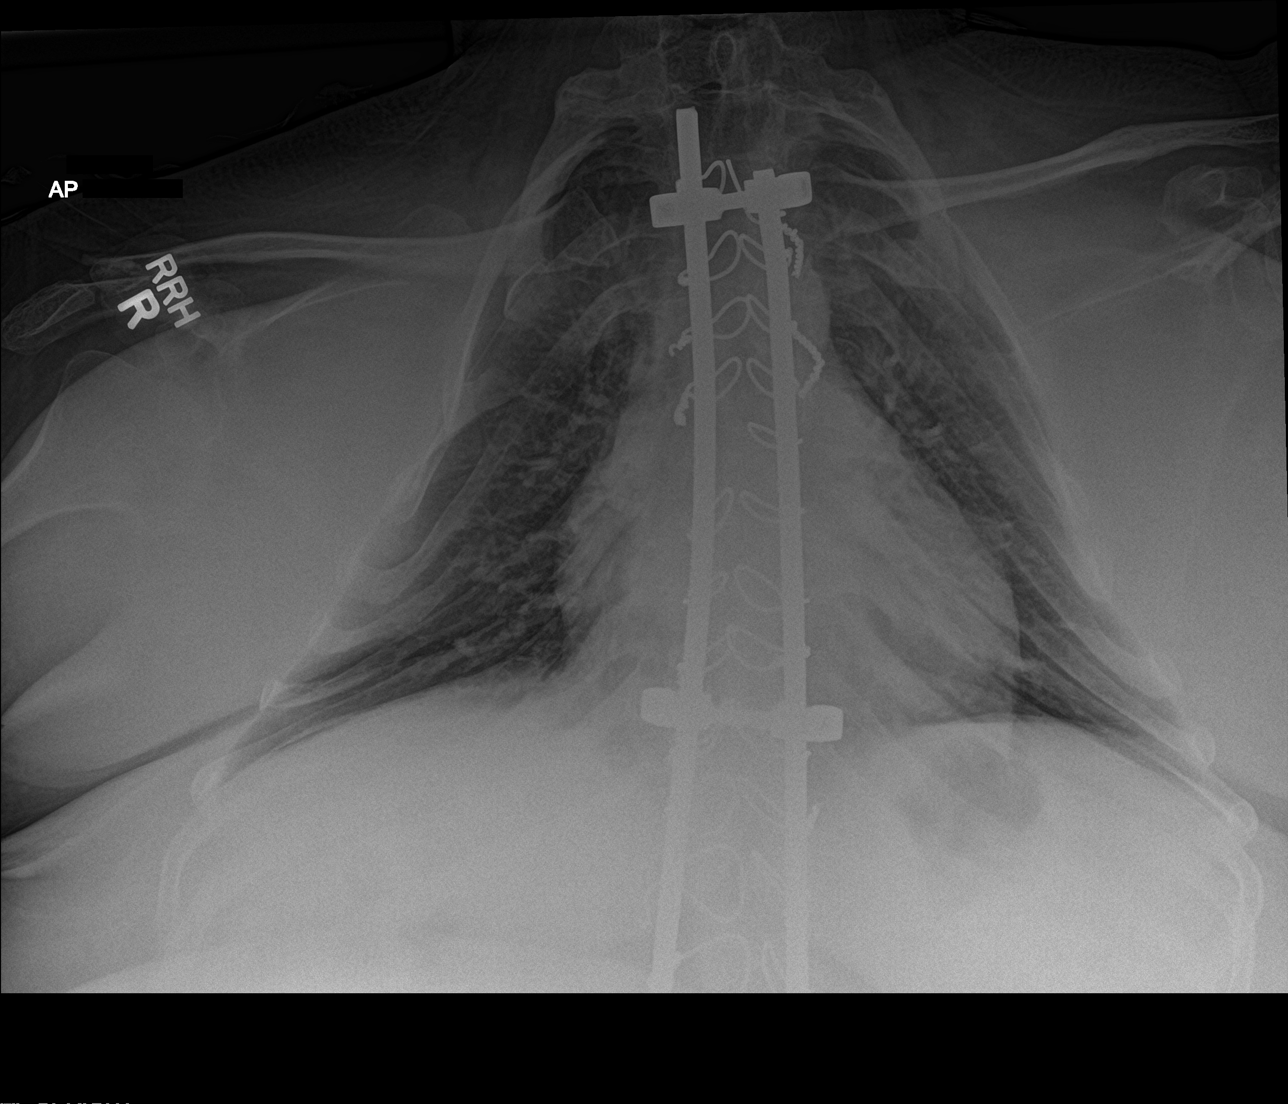

[4 of 4 positions shown; findings below may reference images not displayed]

FINDINGS: The lung bases appear to have cleared with no definite pneumonia
present. Mediastinal and hilar contours are unremarkable and mild
cardiomegaly is stable. Harrington rods are noted.

Supine and erect views of the abdomen show no bowel obstruction. No
free air is seen. Developmental dysplastic hips are present. IUD is
noted in the mid pelvis.
IMPRESSION: 1. Clearing of previous lung opacities with no definite pneumonia.
2. No bowel obstruction.  No free air.

## 2018-09-24 ENCOUNTER — Other Ambulatory Visit: Payer: Self-pay | Admitting: Family Medicine

## 2018-10-04 ENCOUNTER — Ambulatory Visit (HOSPITAL_COMMUNITY): Payer: Medicare Other | Admitting: Psychiatry

## 2018-10-05 ENCOUNTER — Encounter: Payer: Self-pay | Admitting: Family Medicine

## 2018-10-06 ENCOUNTER — Encounter: Payer: Self-pay | Admitting: Family Medicine

## 2018-10-06 ENCOUNTER — Ambulatory Visit (INDEPENDENT_AMBULATORY_CARE_PROVIDER_SITE_OTHER): Payer: Medicare Other | Admitting: Family Medicine

## 2018-10-06 ENCOUNTER — Other Ambulatory Visit: Payer: Self-pay

## 2018-10-06 VITALS — BP 108/76 | HR 83 | Resp 12 | Ht 67.0 in

## 2018-10-06 DIAGNOSIS — F411 Generalized anxiety disorder: Secondary | ICD-10-CM

## 2018-10-06 DIAGNOSIS — L219 Seborrheic dermatitis, unspecified: Secondary | ICD-10-CM

## 2018-10-06 DIAGNOSIS — Z114 Encounter for screening for human immunodeficiency virus [HIV]: Secondary | ICD-10-CM | POA: Diagnosis not present

## 2018-10-06 DIAGNOSIS — J4540 Moderate persistent asthma, uncomplicated: Secondary | ICD-10-CM | POA: Diagnosis not present

## 2018-10-06 DIAGNOSIS — G121 Other inherited spinal muscular atrophy: Secondary | ICD-10-CM

## 2018-10-06 DIAGNOSIS — E559 Vitamin D deficiency, unspecified: Secondary | ICD-10-CM

## 2018-10-06 DIAGNOSIS — E785 Hyperlipidemia, unspecified: Secondary | ICD-10-CM | POA: Diagnosis not present

## 2018-10-06 DIAGNOSIS — E6609 Other obesity due to excess calories: Secondary | ICD-10-CM | POA: Diagnosis not present

## 2018-10-06 DIAGNOSIS — K219 Gastro-esophageal reflux disease without esophagitis: Secondary | ICD-10-CM | POA: Diagnosis not present

## 2018-10-06 NOTE — Patient Instructions (Signed)
Wellness with Nurse end August  MD follow up in end October, call if you need me before]  We will request lower cost alternative for scalp oil, and get back to you  Labs today lipid, cmp and EGFr, HIV, vit D today  Please be careful , try to reduce / use coping strategies  When you atrt over worrying Thanks for choosing [facility], we consider it a privelige to serve you.

## 2018-10-07 LAB — COMPLETE METABOLIC PANEL WITH GFR
AG Ratio: 1.3 (calc) (ref 1.0–2.5)
ALT: 14 U/L (ref 6–29)
AST: 14 U/L (ref 10–30)
Albumin: 4.3 g/dL (ref 3.6–5.1)
Alkaline phosphatase (APISO): 68 U/L (ref 31–125)
BUN/Creatinine Ratio: 50 (calc) — ABNORMAL HIGH (ref 6–22)
BUN: 10 mg/dL (ref 7–25)
CHLORIDE: 102 mmol/L (ref 98–110)
CO2: 26 mmol/L (ref 20–32)
Calcium: 9.5 mg/dL (ref 8.6–10.2)
Creat: 0.2 mg/dL — ABNORMAL LOW (ref 0.50–1.10)
GFR, Est African American: 209 mL/min/{1.73_m2} (ref >=60)
GFR, Est Non African American: 181 mL/min/{1.73_m2} (ref >=60)
Globulin: 3.2 g/dL (calc) (ref 1.9–3.7)
Glucose, Bld: 64 mg/dL — ABNORMAL LOW (ref 65–139)
Potassium: 4.7 mmol/L (ref 3.5–5.3)
Sodium: 137 mmol/L (ref 135–146)
Total Bilirubin: 0.4 mg/dL (ref 0.2–1.2)
Total Protein: 7.5 g/dL (ref 6.1–8.1)

## 2018-10-07 LAB — LIPID PANEL
Cholesterol: 197 mg/dL (ref <200)
HDL: 43 mg/dL — ABNORMAL LOW (ref >=50)
LDL CHOLESTEROL (CALC): 140 mg/dL — AB
Non-HDL Cholesterol (Calc): 154 mg/dL (calc) — ABNORMAL HIGH (ref <130)
Total CHOL/HDL Ratio: 4.6 (calc) (ref <5.0)
Triglycerides: 53 mg/dL (ref <150)

## 2018-10-07 LAB — VITAMIN D 25 HYDROXY (VIT D DEFICIENCY, FRACTURES): Vit D, 25-Hydroxy: 52 ng/mL (ref 30–100)

## 2018-10-07 LAB — HIV ANTIBODY (ROUTINE TESTING W REFLEX): HIV: NONREACTIVE

## 2018-10-09 ENCOUNTER — Telehealth: Payer: Self-pay | Admitting: Family Medicine

## 2018-10-09 ENCOUNTER — Encounter: Payer: Self-pay | Admitting: Family Medicine

## 2018-10-09 ENCOUNTER — Other Ambulatory Visit: Payer: Self-pay | Admitting: Family Medicine

## 2018-10-09 NOTE — Telephone Encounter (Signed)
Please contact pharmacy and let me know alternative for fluocinonide scalp oil on her med list, states it costs over $80, it is for her dandruff/ seborrheic dermatitis  Thank you!

## 2018-10-09 NOTE — Assessment & Plan Note (Signed)
Inadequate control as topical steroid not on formulary, will reach out to pharmacy for alternative

## 2018-10-09 NOTE — Assessment & Plan Note (Signed)
Controlled, no change in medication  

## 2018-10-09 NOTE — Progress Notes (Signed)
   Janet Acevedo     MRN: [id number]      DOB: 10/16/1990   HPI Janet Acevedo is here for follow up and re-evaluation of chronic medical conditions, medication management and review of any available recent lab and radiology data.  Preventive health is updated, specifically   screening and Immunization.   Questions or concerns regarding consultations or procedures which the PT has had in the interim are  addressed. The PT denies any adverse reactions to current medications since the last visit.  There are no new concerns.  There are no specific complaints   ROS Denies recent fever or chills. Denies sinus pressure, nasal congestion, ear pain or sore throat. Denies chest congestion, productive cough or wheezing. Denies chest pains, palpitations and leg swelling Denies abdominal pain, nausea, vomiting,diarrhea or constipation.   Denies dysuria, frequency, hesitancy or incontinence. Denies joint pain,  Denies headaches, seizures, numbness, or tingling. Denies depression, anxiety or insomnia. Denies skin break down or rash.   PE  BP 108/76   Pulse 83   Resp 12   Ht 5\' 7"  (1.702 m)   SpO2 100% Comment: room air  BMI 23.81 kg/m   Patient alert and oriented and in no cardiopulmonary distress.  HEENT: No facial asymmetry, EOMI,   oropharynx pink and moist.  Neck decrease ROM no JVD, no mass.  Chest: Clear to auscultation bilaterally.  CVS: S1, S2 no murmurs, no S3.Regular rate.  ABD: Soft non tender.   Ext: No edema  MS: decreased ROM spine, shoulders, hips and knees.  Skin: Intact, no ulcerations or rash noted.  Psych: Good eye contact, normal affect. Memory intact not anxious or depressed appearing.  CNS: CN 2-12 intact, power,  normal throughout.no focal deficits noted.   Assessment & Plan  Dyslipidemia Hyperlipidemia:Low fat diet discussed and encouraged.   Lipid Panel  Lab Results  Component Value Date   CHOL 197 10/06/2018   HDL 43 (L) 10/06/2018   LDLCALC  140 (H) 10/06/2018   TRIG 53 10/06/2018   CHOLHDL 4.6 10/06/2018     Needs to reduce fatty food intake, deteriorated as far as LDL  Moderate persistent asthma Controlled on current regime, was evaluated by Pulmonary in the past 3 months  Seborrheic dermatitis of scalp Inadequate control as topical steroid not on formulary, will reach out to pharmacy for alternative  GERD Controlled, no change in medication   Anxiety disorder Marked improvement with therapy, continue same. Now oncrased anxiety due to corona Virus as is expected, currently using coping skills she has been learning

## 2018-10-09 NOTE — Assessment & Plan Note (Signed)
Marked improvement with therapy, continue same. Now oncrased anxiety due to corona Virus as is expected, currently using coping skills she has been learning

## 2018-10-09 NOTE — Assessment & Plan Note (Signed)
Controlled on current regime, was evaluated by Pulmonary in the past 3 months

## 2018-10-09 NOTE — Assessment & Plan Note (Signed)
Hyperlipidemia:Low fat diet discussed and encouraged.   Lipid Panel  Lab Results  Component Value Date   CHOL 197 10/06/2018   HDL 43 (L) 10/06/2018   LDLCALC 140 (H) 10/06/2018   TRIG 53 10/06/2018   CHOLHDL 4.6 10/06/2018     Needs to reduce fatty food intake, deteriorated as far as LDL

## 2018-10-10 ENCOUNTER — Encounter: Payer: Self-pay | Admitting: Family Medicine

## 2018-10-10 ENCOUNTER — Other Ambulatory Visit: Payer: Self-pay | Admitting: Family Medicine

## 2018-10-10 MED ORDER — DERMA-SMOOTHE/FS SCALP 0.01 % EX OIL: TOPICAL_OIL | CUTANEOUS | 2 refills | Status: DC

## 2018-10-10 NOTE — Telephone Encounter (Signed)
Message sent to pharmacy to find cheaper alternative

## 2018-10-10 NOTE — Telephone Encounter (Signed)
Thanks, sent in.

## 2018-10-10 NOTE — Progress Notes (Signed)
dermasmoothe

## 2018-10-10 NOTE — Telephone Encounter (Signed)
PA denied. Possible alternative is derma smooth oil

## 2018-10-18 ENCOUNTER — Ambulatory Visit (HOSPITAL_COMMUNITY): Payer: Medicare Other | Admitting: Psychiatry

## 2018-10-24 ENCOUNTER — Encounter: Payer: Self-pay | Admitting: Family Medicine

## 2018-10-26 ENCOUNTER — Encounter: Payer: Self-pay | Admitting: Family Medicine

## 2018-11-01 ENCOUNTER — Other Ambulatory Visit: Payer: Self-pay

## 2018-11-01 ENCOUNTER — Ambulatory Visit (HOSPITAL_COMMUNITY): Payer: Medicare Other | Admitting: Psychiatry

## 2018-11-15 ENCOUNTER — Ambulatory Visit (HOSPITAL_COMMUNITY): Payer: Medicare Other | Admitting: Psychiatry

## 2018-11-17 ENCOUNTER — Other Ambulatory Visit: Payer: Self-pay | Admitting: Family Medicine

## 2018-11-25 ENCOUNTER — Other Ambulatory Visit: Payer: Self-pay | Admitting: Family Medicine

## 2018-12-06 ENCOUNTER — Ambulatory Visit (INDEPENDENT_AMBULATORY_CARE_PROVIDER_SITE_OTHER): Payer: Medicare Other | Admitting: Psychiatry

## 2018-12-06 ENCOUNTER — Other Ambulatory Visit: Payer: Self-pay

## 2018-12-06 DIAGNOSIS — F411 Generalized anxiety disorder: Secondary | ICD-10-CM

## 2018-12-06 NOTE — Progress Notes (Signed)
Virtual Visit via Video Note  I connected with Janet Acevedo on 12/06/18 at 1:08 PM  by a video enabled telemedicine application and verified that I am speaking with the correct person using two identifiers.   I discussed the limitations of evaluation and management by telemedicine and the availability of in person appointments. The patient expressed understanding and agreed to proceed.  .  I provided 57 minutes of non-face-to-face time during this encounter.   [name], LCSW       '      THERAPIST PROGRESS NOTE  Session Time: Tuesday 12/06/2018 1:08 PM - 2:05 PM     Participation Level: Active  Behavioral Response: CasualAlertAnxious  Type of Therapy: Individual Therapy      Treatment Goals addressed:  learn and implement cognitive and behavioral strategies to cope with anxiety  Interventions: CBT and Supportive  Summary: Janet Acevedo is a 28 y.o. female who is referred for services by psychiatrist Dr. Modesta Messing due to patient experiencing symptoms of anxiety. She denies any psychiatric hospitalizations and no previous involvement in psychotherapy.  patient reports having uncontrollable anxiety and finding herself in an anxious state every day. She reports having anxiety since childhood but has worsened in the last 1-2 years. She reports stress regarding an aunt who is a quadriplegic as her health has fluctuated. Patient reports being very close with his aunt. She also reports dealing with her sexuality and says she has had a girlfriend secretly for the past 8 years. She says she used to be okay with this but now being tired of hiding it. She also worries about her brother who is in prison and openly gay. Patient's reported symptoms of anxiety include "sweaty palms, hands tremble alot, voice is shakey, emotional outbursts of crying, increased heart rate".  Patient was last seen in January 2020. She reports increased stress and excessive worry related to impact of COVID-19 pandemic.  She expresses worry as her immune system is compromised. She fears she may contract the virus and will not survive. She has been assertive and expressed her concerns to others in her household and have worked with them to take the necessary precautions regarding decreasing the possibility of exposure. She reports she also limits her exposure to the news. She reports her day does not have much structure. She mainly listens to music, plays video games, and watches TV. She also reports stress regarding relationship with her girlfriend who wants to contact one of her exes. Patient was assertive with girlfriend and expresses her discomfort with this. Now there is tension in their relationship.   Suicidal/Homicidal: Nowithout intent/plan  Therapist Response:  Reviewed symptoms,discussed stressors, facilitated expression of thoughts and feelings, validated feelings, emailed and discussed guide on living with worry and anxiety amidst global uncertainty, discussed ways to create balance/improve daily structure, developed plan for patient to review guide, choose and complete an activity daily to create balance regarding achievement, discussed real worries versus hypothetical worries and ways to address, praised and reinforced patient's efforts to use assertiveness skills, discussed effects   Plan: Return again in 2 weeks.  Diagnosis: Axis I: Generalized Anxiety Disorder    Axis II: Deferred    [name], LCSW 12/06/2018

## 2018-12-07 ENCOUNTER — Encounter: Payer: Self-pay | Admitting: *Deleted

## 2018-12-10 ENCOUNTER — Other Ambulatory Visit: Payer: Self-pay | Admitting: Family Medicine

## 2018-12-22 ENCOUNTER — Ambulatory Visit (INDEPENDENT_AMBULATORY_CARE_PROVIDER_SITE_OTHER): Payer: Medicare Other | Admitting: Family Medicine

## 2018-12-22 ENCOUNTER — Encounter: Payer: Self-pay | Admitting: Family Medicine

## 2018-12-22 ENCOUNTER — Other Ambulatory Visit: Payer: Self-pay

## 2018-12-22 VITALS — BP 108/78 | HR 83 | Resp 12 | Ht 67.0 in | Wt 152.0 lb

## 2018-12-22 DIAGNOSIS — Z0000 Encounter for general adult medical examination without abnormal findings: Secondary | ICD-10-CM

## 2018-12-22 NOTE — Progress Notes (Signed)
Subjective:   Janet Acevedo is a 28 y.o. female who presents for Medicare Annual (Subsequent) preventive examination.  Location of Patient: Home Location of Provider: Telehealth Consent was obtain for visit to be over via telehealth. I verified that I am speaking with the correct person using two identifiers.   Review of Systems:    Cardiac Risk Factors include: dyslipidemia     Objective:     Vitals: BP 108/78   Pulse 83   Resp 12   Ht _0  (1.702 m)   Wt 152 lb (68.9 kg)   BMI 23.81 kg/m   Body mass index is 23.81 kg/m.  Advanced Directives 01/12/2017  Does Patient Have a Medical Advance Directive? No    Tobacco Social History   Tobacco Use  Smoking Status Never Smoker  Smokeless Tobacco Never Used     Counseling given: Yes   Clinical Intake:  Pre-visit preparation completed: Yes  Pain : No/denies pain Pain Score: 0-No pain     BMI - recorded: 23.81 Nutritional Status: BMI of 19-24  Normal Nutritional Risks: None Diabetes: No  How often do you need to have someone help you when you read instructions, pamphlets, or other written materials from your doctor or pharmacy?: 1 - Never What is the last grade level you completed in school?: college  Interpreter Needed?: No     Past Medical History:  Diagnosis Date  . Allergy   . Anxiety   . Asthma   . Depression, major, single episode, moderate ([place]) 09/05/2017  . GERD (gastroesophageal reflux disease)   . Neuromuscular disorder ([place])   . Osteomyelitis [facility]) June 2011  . Perennial allergic rhinitis   . Seborrheic dermatitis of scalp 2006  . Spinal muscle atrophy ([place])    type 2/3 18 months   Past Surgical History:  Procedure Laterality Date  . bilateral hip reinsertion  under age 89   hips out of socket, pt was only able to cruise hold on and move short distances   . BONE BIOPSY     spinal bone   . MIRENA PLACED 10/27/10    . SPINAL FUSION  1999   rods placed in thoracolumbar spine to  excessive scoliosis primarily    Family History  Problem Relation Age of Onset  . Hyperlipidemia Mother   . Hypertension Mother   . Hypertension Brother   . Diabetes Brother        borderline   . Anxiety disorder Brother   . Depression Brother   . Drug abuse Brother    Social History   Socioeconomic History  . Marital status: Single    Spouse name: Not on file  . Number of children: Not on file  . Years of education: Not on file  . Highest education level: Associate degree: academic program  Occupational History  . Occupation: Ship broker  Social Needs  . Financial resource strain: Not hard at all  . Food insecurity:    Worry: Never true    Inability: Never true  . Transportation needs:    Medical: No    Non-medical: No  Tobacco Use  . Smoking status: Never Smoker  . Smokeless tobacco: Never Used  Substance and Sexual Activity  . Alcohol use: No  . Drug use: No  . Sexual activity: Never  Lifestyle  . Physical activity:    Days per week: 0 days    Minutes per session: 0 min  . Stress: Not at all  Relationships  .  Social connections:    Talks on phone: More than three times a week    Gets together: More than three times a week    Attends religious service: More than 4 times per year    Active member of club or organization: No    Attends meetings of clubs or organizations: Never    Relationship status: Not on file  Other Topics Concern  . 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 albuterol (ACCUNEB) 1.25 MG/3ML nebulizer solution TAKE 3 MLS (1.25 MG TOTAL) BY NEBULIZATION EVERY 4 (FOUR) HOURS AS NEEDED. DX J45.40  . albuterol (VENTOLIN HFA) 108 (90 Base) MCG/ACT inhaler INHALE 2 PUFFS INTO THE LUNGS EVERY 4 (FOUR) HOURS AS NEEDED FOR WHEEZING OR SHORTNESS OF BREATH.  . betamethasone dipropionate (DIPROLENE) 0.05 % cream APPLY  TOPICALLY 2 (TWO) TIMES DAILY.  [name] Biotin (BIOTIN MAXIMUM STRENGTH) 10 MG TABS Take 1 tablet by mouth daily.  . cromolyn (OPTICROM) 4 % ophthalmic solution PLACE 1 DROP INTO BOTH EYES FOUR TIMES DAILY  . DERMA-SMOOTHE/FS SCALP 0.01 % OIL Apply sparingly twice weekly to scal , as needed  . ketoconazole (NIZORAL) 2 % cream APPLY 1 APPLICATION TOPICALLY AS NEEDED FOR DERMATITIS  . levocetirizine (XYZAL) 5 MG tablet TAKE 1 TABLET BY MOUTH EVERY DAY IN THE EVENING  . montelukast (SINGULAIR) 10 MG tablet TAKE 1 TABLET BY MOUTH EVERY DAY  . nystatin (MYCOSTATIN/NYSTOP) powder APPLY TWICE DAILY TO AFFECTED AREA AS NEEDED  . omeprazole (PRILOSEC) 40 MG capsule One capsule twice daily effective 07/2017  Per GI at [facility]  . Respiratory Therapy Supplies (NEBULIZER/ADULT MASK) KIT Insurance preference  . Respiratory Therapy Supplies (NEBULIZER/TUBING/MOUTHPIECE) KIT Use as directed. Insurance preference. Nebulizer kit and mask  . Spacer/Aero-Holding Chambers (AEROCHAMBER PLUS) inhaler Use as instructed  . SYMBICORT 160-4.5 MCG/ACT inhaler TAKE 2 PUFFS BY MOUTH TWICE A DAY  . UNABLE TO FIND Gloves- 1 box per month as needed  . UNABLE TO FIND Under pads- for use daily as needed   No facility-administered encounter medications on file as of 12/22/2018.     Activities of Daily Living In your present state of health, do you have any difficulty performing the following activities: 12/22/2018  Hearing? N  Vision? N  Comment does wears glasses  Difficulty concentrating or making decisions? N  Walking or climbing stairs? Y  Comment unable to walk  Dressing or bathing? Y  Doing errands, shopping? Y  Comment has to have someone with her  Preparing Food and eating ? Y  Using the Toilet? Y  In the past six months, have you accidently leaked urine? Y  Do you have problems with loss of bowel control? Y  Managing your Medications? Y  Managing your Finances? N  Housekeeping or managing your Housekeeping? Y   Some recent data might be hidden    Patient Care Team: Fayrene Helper, MD as PCP - General (Family Medicine) Aretha Parrot Donnella Sham, MD as Referring Physician (Pulmonary Disease)    Assessment:   This is a routine wellness examination for Khiana.  Exercise Activities and Dietary recommendations Current Exercise Habits: The patient does not participate in regular exercise at present, Exercise limited by: neurologic condition(s);orthopedic condition(s)  Goals   None     Fall Risk Fall Risk  12/22/2018 10/06/2018 07/12/2017 04/08/2017 04/09/2015  Falls in the past  year? 1 0 No No No  Number falls in past yr: 1 - - - -  Injury with Fall? 0 0 - - -   Is the patient's home free of loose throw rugs in walkways, pet beds, electrical cords, etc?   yes      Grab bars in the bathroom? yes      Handrails on the stairs?   no stairs      Adequate lighting?   yes  Timed Get Up and Go performed:  n/a Depression Screen PHQ 2/9 Scores 12/22/2018 10/06/2018 05/10/2018 01/05/2018  PHQ - 2 Score 0 0 0 0  PHQ- 9 Score - - - -     Cognitive Function     6CIT Screen 12/22/2018 07/12/2017  What Year? 0 points 0 points  What month? 0 points 0 points  What time? 0 points 0 points  Count back from 20 0 points 0 points  Months in reverse 0 points 0 points  Repeat phrase 0 points 0 points  Total Score 0 0    Immunization History  Administered Date(s) Administered  . Influenza Split 04/07/2012  . Influenza Whole 04/07/2010, 04/03/2011  . Influenza,inj,Quad PF,6+ Mos 05/10/2013, 07/12/2014, 04/09/2015, 05/05/2016, 05/03/2017, 05/10/2018  . Pneumococcal Conjugate-13 01/20/2017    Qualifies for Shingles Vaccine? Not recommended for age  Screening Tests Health Maintenance  Topic Date Due  . PAP-Cervical Cytology Screening  12/19/2012  . PAP SMEAR-Modifier  12/19/2012  . INFLUENZA VACCINE  02/25/2019  . TETANUS/TDAP  07/27/2025  . HIV Screening  Completed    Cancer Screenings: Lung:  Low Dose CT Chest recommended if Age 51-80 years, 30 pack-year currently smoking OR have quit w/in 15years. Patient does not qualify. Breast:  Up to date on Mammogram? N/a Up to date of Bone Density/Dexa? n/a Colorectal:  N/a  Additional Screenings:   Hepatitis C Screening: adding to next set of labs      Plan:      1. Encounter for Medicare annual wellness exam  I have personally reviewed and noted the following in the patient's chart:   . Medical and social history . Use of alcohol, tobacco or illicit drugs  . Current medications and supplements . Functional ability and status . Nutritional status . Physical activity . Advanced directives . List of other physicians . Hospitalizations, surgeries, and ER visits in previous 12 months . Vitals . Screenings to include cognitive, depression, and falls . Referrals and appointments  In addition, I have reviewed and discussed with patient certain preventive protocols, quality metrics, and best practice recommendations. A written personalized care plan for preventive services as well as general preventive health recommendations were provided to patient.    I provided 20 minutes of non-face-to-face time during this encounter.   Perlie Mayo, NP  12/22/2018

## 2018-12-22 NOTE — Patient Instructions (Signed)
    [place] YOUR HANDS WELL AND FREQUENTLY. AVOID TOUCHING YOUR FACE, UNLESS YOUR HANDS ARE FRESHLY WASHED.  GET FRESH AIR DAILY. STAY HYDRATED WITH WATER.   It was a pleasure to see you and I look forward to continuing to work together on your health and well-being. Please do not hesitate to call the office if you need care or have questions about your care.    Preventive Care for Adults  A healthy lifestyle and preventive care can promote health and wellness. Preventive health guidelines for adults include the following key practices.  . A routine yearly physical is a good way to check with your health care provider about your health and preventive screening. It is a chance to share any concerns and updates on your health and to receive a thorough exam.  . Visit your dentist for a routine exam and preventive care every 6 months. Brush your teeth twice a day and floss once a day. Good oral hygiene prevents tooth decay and gum disease.  . The frequency of eye exams is based on your age, health, family medical history, use  of contact lenses, and other factors. Follow your health care provider's ecommendations for frequency of eye exams.  . Eat a healthy diet. Foods like vegetables, fruits, whole grains, low-fat dairy products, and lean protein foods contain the nutrients you need without too many calories. Decrease your intake of foods high in solid fats, added sugars, and salt. Eat the right amount of calories for you. Get information about a proper diet from your health care provider, if necessary.  . Regular physical exercise is one of the most important things you can do for your health. Most adults should get at least 150 minutes of moderate-intensity exercise (any activity that increases your heart rate and causes you to sweat) each week. In addition, most adults need muscle-strengthening exercises on 2 or more days a week.  . Maintain a healthy weight. The body mass index (BMI)  is a screening tool to identify possible weight problems. It provides an estimate of body fat based on height and weight. Your health care provider can find your BMI and can help you achieve or maintain a healthy weight.   Calcium recommendations are 1200-1500 mg daily (1500 mg for postmenopausal women or women without ovaries), and vitamin D 1000 IU daily.  This should be obtained from diet and/or supplements (vitamins), and calcium should not be taken all at once, but in divided doses.  Monthly self breast exams and yearly mammograms for women over the age of 54 is recommended.  Sunscreen of at least SPF 30 should be used on all sun-exposed parts of the skin when outside between the hours of 10 am and 4 pm (not just when at beach or pool, but even with exercise, golf, tennis, and yard work!)  Use a sunscreen that says "broad spectrum" so it covers both UVA and UVB rays, and make sure to reapply every 1-2 hours.  Remember to change the batteries in your smoke detectors when changing your clock times in the spring and fall.  Use your seat belt every time you are in a car, and please drive safely and not be distracted with cell phones and texting while driving.

## 2018-12-27 ENCOUNTER — Ambulatory Visit (INDEPENDENT_AMBULATORY_CARE_PROVIDER_SITE_OTHER): Payer: Medicaid Other | Admitting: Psychiatry

## 2018-12-27 ENCOUNTER — Other Ambulatory Visit: Payer: Self-pay

## 2018-12-27 DIAGNOSIS — F411 Generalized anxiety disorder: Secondary | ICD-10-CM

## 2018-12-27 NOTE — Progress Notes (Signed)
Virtual Visit via Video Note  I connected with Janet Acevedo on 12/27/18 at  1:00 PM EDT by a video enabled telemedicine application and verified that I am speaking with the correct person using two identifiers.   I discussed the limitations of evaluation and management by telemedicine and the availability of in person appointments. The patient expressed understanding and agreed to proceed.   I provided 45 minutes of non-face-to-face time during this encounter.   [name], LCSW       '      THERAPIST PROGRESS NOTE  Session Time: Tuesday 12/27/2018 1:10 PM -     Participation Level: Active  Behavioral Response: CasualAlertAnxious  Type of Therapy: Individual Therapy      Treatment Goals addressed:  learn and implement cognitive and behavioral strategies to cope with anxiety  Interventions: CBT and Supportive  Summary: Janet Acevedo is a 28 y.o. female who is referred for services by psychiatrist Dr. Modesta Messing due to patient experiencing symptoms of anxiety. She denies any psychiatric hospitalizations and no previous involvement in psychotherapy.  patient reports having uncontrollable anxiety and finding herself in an anxious state every day. She reports having anxiety since childhood but has worsened in the last 1-2 years. She reports stress regarding an aunt who is a quadriplegic as her health has fluctuated. Patient reports being very close with his aunt. She also reports dealing with her sexuality and says she has had a girlfriend secretly for the past 8 years. She says she used to be okay with this but now being tired of hiding it. She also worries about her brother who is in prison and openly gay. Patient's reported symptoms of anxiety include "sweaty palms, hands tremble alot, voice is shakey, emotional outbursts of crying, increased heart rate".  Patient was last seen about 2 weeks ago by virtual visit. She reports increased stress and anxiety triggered by recent current events  including the killing of a man by police and protests. She now is considering taking medication Dr. Modesta Messing prescribed. She agrees to follow up with Dr. Modesta Messing. She reports sleep difficulty and restlessness. She worries and fears for the safety of loved ones. She reports she has been more productive as she has coped by expressing thoughts and opinions on social media as well as provided helpful links related to social justice. She reports involvement in this has given her less time to think about relationship with girlfriend  and is not as stressed by the relationship. She also is pleased she wrote letter to her brother. Patient reports   Suicidal/Homicidal: Nowithout intent/plan  Therapist Response:  Reviewed symptoms, praised and reinforce patient's increased involvement in activity, discussed effects, discussed stressors, facilitated expression of thoughts and feelings, validated feelings, assisted patient develop plan to improve sleep hygiene ( consistent bed time and wake time, give up caffeine in evenings, turn off electronics one hour before bed,)  emailed patient mindfulness and emotions handout, assigned her to review for next session.  Plan: Return again in 2 weeks.  Diagnosis: Axis I: Generalized Anxiety Disorder    Axis II: Deferred    [name], LCSW 12/27/2018

## 2019-01-10 ENCOUNTER — Other Ambulatory Visit: Payer: Self-pay

## 2019-01-10 ENCOUNTER — Ambulatory Visit (HOSPITAL_COMMUNITY): Payer: Medicare Other | Admitting: Psychiatry

## 2019-01-24 ENCOUNTER — Other Ambulatory Visit: Payer: Self-pay

## 2019-01-24 ENCOUNTER — Ambulatory Visit (INDEPENDENT_AMBULATORY_CARE_PROVIDER_SITE_OTHER): Payer: Medicaid Other | Admitting: Psychiatry

## 2019-01-24 DIAGNOSIS — F411 Generalized anxiety disorder: Secondary | ICD-10-CM | POA: Diagnosis not present

## 2019-01-24 NOTE — Progress Notes (Signed)
Virtual Visit via Video Note  I connected with Janet Acevedo on 01/24/19 at 1:10 PM EDT by a video enabled telemedicine application and verified that I am speaking with the correct person using two identifiers.   I discussed the limitations of evaluation and management by telemedicine and the availability of in person appointments. The patient expressed understanding and agreed to proceed.   I provided 45 minutes of non-face-to-face time during this encounter.   [name], LCSW        '      THERAPIST PROGRESS NOTE  Session Time: Tuesday 01/24/2019 1:10 PM - 2:45 PM     Participation Level: Active  Behavioral Response: CasualAlertAnxious  Type of Therapy: Individual Therapy      Treatment Goals addressed:  learn and implement cognitive and behavioral strategies to cope with anxiety  Interventions: CBT and Supportive  Summary: Janet Acevedo is a 28 y.o. female who is referred for services by psychiatrist Dr. Modesta Messing due to patient experiencing symptoms of anxiety. She denies any psychiatric hospitalizations and no previous involvement in psychotherapy.  patient reports having uncontrollable anxiety and finding herself in an anxious state every day. She reports having anxiety since childhood but has worsened in the last 1-2 years. She reports stress regarding an aunt who is a quadriplegic as her health has fluctuated. Patient reports being very close with his aunt. She also reports dealing with her sexuality and says she has had a girlfriend secretly for the past 8 years. She says she used to be okay with this but now being tired of hiding it. She also worries about her brother who is in prison and openly gay. Patient's reported symptoms of anxiety include "sweaty palms, hands tremble alot, voice is shakey, emotional outbursts of crying, increased heart rate".  Patient was last seen about 4 weeks ago by virtual visit. She reports copntinued stress and anxiety triggered by increase in   COVID-19 cases and her partner returning to work. She is concerned about  partner's increased potential exposure. She also expresses frustration extended family members don't wear masks or adhere to social distancing recommendations when they visit patient. She reports minimal improvement in sleep hygiene efforts. 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    Suicidal/Homicidal: Nowithout intent/plan  Therapist Response:  Reviewed symptoms, praised and reinforced patient's increased involvement in activity with a book club, discussed effects, discussed stressors, facilitated expression of thoughts and feelings, validated feelings, assisted patient identify/address  thoughts and processes that inhibited  plan to improve sleep hygiene ( consistent bed time and wake time, give up caffeine in evenings, turn off electronics one hour before bed,) discussed ways to improve assertiveness skills with family members through use of "I" messages to request they wear masks/adhere to social distancing recommendations when visiting patient, assisted patient identify thoughts that would inhibit effective assertion and replace with thoughts to promote effective assertion,  Plan: Return again in 2 weeks.  Diagnosis: Axis I: Generalized Anxiety Disorder    Axis II: Deferred    [name], LCSW 01/24/2019

## 2019-02-07 ENCOUNTER — Ambulatory Visit (INDEPENDENT_AMBULATORY_CARE_PROVIDER_SITE_OTHER): Payer: Medicare Other | Admitting: Psychiatry

## 2019-02-07 ENCOUNTER — Other Ambulatory Visit: Payer: Self-pay

## 2019-02-07 DIAGNOSIS — F411 Generalized anxiety disorder: Secondary | ICD-10-CM

## 2019-02-07 NOTE — Progress Notes (Signed)
Virtual Visit via Telephone Note  I connected with Janet Acevedo on 02/07/19 at  1:00 PM EDT by telephone and verified that I am speaking with the correct person using two identifiers.   I discussed the limitations, risks, security and privacy concerns of performing an evaluation and management service by telephone and the availability of in person appointments. I also discussed with the patient that there may be a patient responsible charge related to this service. The patient expressed understanding and agreed to proceed.  I provided 35 minutes of non-face-to-face time during this encounter.   [name], LCSW        '      THERAPIST PROGRESS NOTE  Session Time: Tuesday 02/07/2019 1:20 PM -  1:55 PM    Participation Level: Active  Behavioral Response: CasualAlertAnxious  Type of Therapy: Individual Therapy      Treatment Goals addressed:  learn and implement cognitive and behavioral strategies to cope with anxiety  Interventions: CBT and Supportive  Summary: Janet Acevedo is a 28 y.o. female who is referred for services by psychiatrist Dr. Modesta Messing due to patient experiencing symptoms of anxiety. She denies any psychiatric hospitalizations and no previous involvement in psychotherapy.  patient reports having uncontrollable anxiety and finding herself in an anxious state every day. She reports having anxiety since childhood but has worsened in the last 1-2 years. She reports stress regarding an aunt who is a quadriplegic as her health has fluctuated. Patient reports being very close with his aunt. She also reports dealing with her sexuality and says she has had a girlfriend secretly for the past 8 years. She says she used to be okay with this but now being tired of hiding it. She also worries about her brother who is in prison and openly gay. Patient's reported symptoms of anxiety include "sweaty palms, hands tremble alot, voice is shakey, emotional outbursts of crying, increased heart  rate".  Patient was last seen about 2weeks ago by virtual visit. She reports increased sadness, depressed mood, and decreased involvement in activity. Per her report, this was triggered by not accepting a job offer due to possibility of her disability benefits being negatively affected. Patient reports having thoughts of feeling trapped and says she stopped participating in normally enjoyed activities like participating in the book club and playing video games. She also reports social withdrawal.She also began catasrophizing about the future.   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  Diagnosis: Axis I: Generalized Anxiety Disorder    Axis II: Deferred    [name], LCSW 02/07/2019

## 2019-02-20 ENCOUNTER — Other Ambulatory Visit: Payer: Self-pay | Admitting: Family Medicine

## 2019-03-02 ENCOUNTER — Other Ambulatory Visit: Payer: Self-pay

## 2019-03-02 MED ORDER — UNABLE TO FIND: 0 refills | Status: AC

## 2019-03-16 ENCOUNTER — Ambulatory Visit (INDEPENDENT_AMBULATORY_CARE_PROVIDER_SITE_OTHER): Payer: Medicaid Other | Admitting: Psychiatry

## 2019-03-16 ENCOUNTER — Other Ambulatory Visit: Payer: Self-pay

## 2019-03-16 DIAGNOSIS — F411 Generalized anxiety disorder: Secondary | ICD-10-CM

## 2019-03-16 NOTE — Progress Notes (Signed)
Virtual Visit via Video Note  I connected with [name] on 03/16/19 at 1:15 PM EDT by a video enabled telemedicine application and verified that I am speaking with the correct person using two identifiers.   I discussed the limitations of evaluation and management by telemedicine and the availability of in person appointments. The patient expressed understanding and agreed to proceed.  I provided 40  minutes of non-face-to-face time during this encounter.   [name], LCSW         '      THERAPIST PROGRESS NOTE  Session Time: Thursday 03/16/2019 1:15 PM -1:55 PM     Participation Level: Active  Behavioral Response: CasualAlertAnxious  Type of Therapy: Individual Therapy      Treatment Goals addressed:  learn and implement cognitive and behavioral strategies to cope with anxiety  Interventions: CBT and Supportive  Summary: Janet Acevedo is a 28 y.o. female who is referred for services by psychiatrist Dr. Modesta Messing due to patient experiencing symptoms of anxiety. She denies any psychiatric hospitalizations and no previous involvement in psychotherapy.  patient reports having uncontrollable anxiety and finding herself in an anxious state every day. She reports having anxiety since childhood but has worsened in the last 1-2 years. She reports stress regarding an aunt who is a quadriplegic as her health has fluctuated. Patient reports being very close with his aunt. She also reports dealing with her sexuality and says she has had a girlfriend secretly for the past 8 years. She says she used to be okay with this but now being tired of hiding it. She also worries about her brother who is in prison and openly gay. Patient's reported symptoms of anxiety include "sweaty palms, hands tremble alot, voice is shakey, emotional outbursts of crying, increased heart rate".  Patient was last seen about 4 weeks ago by virtual visit. She reports increased sadness and depressed mood. She reports her  dog of 7 years died this past weekend. She reports strong support from family and friends in sharing and validating her grief. She also reports sadness and depression related to impact of COVID-19 pandemic as she has not left her home since March, 2019. She reports initially wanting to go anywhere but now wanting to have a change of scenery. However, she reports experiencing anxiety about leaving her home. 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   Suicidal/Homicidal: Nowithout intent/plan  Therapist Response:  Reviewed symptoms, praised and reinforced patient increased involvement in activity regarding helping her cousin, discussed effects, processed grief and loss issues regarding her dog, validated her grief, used imaginal exposure to help patient cope with anxiety about going for a ride with her mother to get out of the house, developed plan with patient to go on a ride with her mother 1-2 times before next session  Plan: Return again in 2 weeks.  Diagnosis: Axis I: Generalized Anxiety Disorder    Axis II: Deferred    [name], LCSW 03/16/2019

## 2019-03-20 ENCOUNTER — Ambulatory Visit: Payer: Self-pay

## 2019-03-30 ENCOUNTER — Ambulatory Visit (INDEPENDENT_AMBULATORY_CARE_PROVIDER_SITE_OTHER): Payer: Medicaid Other | Admitting: Psychiatry

## 2019-03-30 ENCOUNTER — Other Ambulatory Visit: Payer: Self-pay

## 2019-03-30 DIAGNOSIS — F411 Generalized anxiety disorder: Secondary | ICD-10-CM | POA: Diagnosis not present

## 2019-03-30 NOTE — Progress Notes (Signed)
Virtual Visit via Video Note  I connected with Janet Acevedo on 03/30/19 at 1:10 PM EDT by a video enabled telemedicine application and verified that I am speaking with the correct person using two identifiers.   I discussed the limitations of evaluation and management by telemedicine and the availability of in person appointments. The patient expressed understanding and agreed to proceed.   I provided 40 minutes of non-face-to-face time during this encounter.   Janet Smoker, LCSW         '      THERAPIST PROGRESS NOTE  Session Time: Thursday 03/30/2019 1:10 PM - 1:50 PM    Participation Level: Active  Behavioral Response: CasualAlertAnxious  Type of Therapy: Individual Therapy      Treatment Goals addressed:  learn and implement cognitive and behavioral strategies to cope with anxiety  Interventions: CBT and Supportive  Summary: Janet Acevedo is a 28 y.o. female who is referred for services by psychiatrist Dr. Modesta Messing due to patient experiencing symptoms of anxiety. She denies any psychiatric hospitalizations and no previous involvement in psychotherapy.  patient reports having uncontrollable anxiety and finding herself in an anxious state every day. She reports having anxiety since childhood but has worsened in the last 1-2 years. She reports stress regarding an aunt who is a quadriplegic as her health has fluctuated. Patient reports being very close with his aunt. She also reports dealing with her sexuality and says she has had a girlfriend secretly for the past 8 years. She says she used to be okay with this but now being tired of hiding it. She also worries about her brother who is in prison and openly gay. Patient's reported symptoms of anxiety include "sweaty palms, hands tremble alot, voice is shakey, emotional outbursts of crying, increased heart rate".  Patient was last seen about 2-3 weeks ago by virtual visit. She reports decreased sadness since last session. She went on a  ride with her mother but says transmission on vehicle malfunctioned and it took 3hours to finally get home. She was stressed by this but reports managing the ride well. She is pleased with her efforts and is planning to go out again with mother when vehicle is repaired. She has continued to help 47 yo cousin with virtual school and enjoys doing this. She expresses some anxiety he may resume attendance in school in person in the next two weeks. She and her family help provide childcare to cousin and patient fears he may have possible COVID exposure resulting in patient possibly being exposed. 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 She reports thoughts of not being good enough.  Suicidal/Homicidal: Nowithout intent/plan  Therapist Response:  Reviewed symptoms, praised and reinforced patient's efforts in going for ride with mother, discussed effects on mood and thoughts, discussed next steps for treatment to include cognitive restructuring, reviewed connection between thoughts, mood, and behavior, reviewed use of thought log and assisted patient complete using example from patient's life, assigned patient to complete thought log once daily between sessions and bring to next session.  Plan: Return again in 2 weeks.  Diagnosis: Axis I: Generalized Anxiety Disorder    Axis II: Deferred    Janet Smoker, LCSW 03/30/2019

## 2019-04-13 ENCOUNTER — Ambulatory Visit (HOSPITAL_COMMUNITY): Payer: Medicare Other | Admitting: Psychiatry

## 2019-04-13 ENCOUNTER — Other Ambulatory Visit: Payer: Self-pay

## 2019-04-13 ENCOUNTER — Telehealth (HOSPITAL_COMMUNITY): Payer: Self-pay | Admitting: Psychiatry

## 2019-04-13 NOTE — Telephone Encounter (Signed)
Therapist attempted to contact patient twice via text through [email or web address] platform for scheduled appointment, no response.  Therapist called patient and left message indicating attempt and requesting patient call office.

## 2019-05-11 ENCOUNTER — Ambulatory Visit (INDEPENDENT_AMBULATORY_CARE_PROVIDER_SITE_OTHER): Payer: Medicare Other | Admitting: Psychiatry

## 2019-05-11 ENCOUNTER — Other Ambulatory Visit: Payer: Self-pay

## 2019-05-11 DIAGNOSIS — F411 Generalized anxiety disorder: Secondary | ICD-10-CM | POA: Diagnosis not present

## 2019-05-11 NOTE — Progress Notes (Signed)
Virtual Visit via Video Note  I connected with [name] on 05/11/19 at 2:15 PM EDT by a video enabled telemedicine application and verified that I am speaking with the correct person using two identifiers.   I discussed the limitations of evaluation and management by telemedicine and the availability of in person appointments. The patient expressed understanding and agreed to proceed.   I provided 45 minutes of non-face-to-face time during this encounter.   [name], LCSW          '      THERAPIST PROGRESS NOTE  Session Time: Thursday 05/11/2019 2:15 PM - 3:00 PM    Participation Level: Active  Behavioral Response: CasualAlertAnxious  Type of Therapy: Individual Therapy      Treatment Goals addressed:  learn and implement cognitive and behavioral strategies to cope with anxiety  Interventions: CBT and Supportive  Summary: Janet Acevedo is a 28 y.o. female who is referred for services by psychiatrist Dr. Modesta Messing due to patient experiencing symptoms of anxiety. She denies any psychiatric hospitalizations and no previous involvement in psychotherapy.  patient reports having uncontrollable anxiety and finding herself in an anxious state every day. She reports having anxiety since childhood but has worsened in the last 1-2 years. She reports stress regarding an aunt who is a quadriplegic as her health has fluctuated. Patient reports being very close with his aunt. She also reports dealing with her sexuality and says she has had a girlfriend secretly for the past 8 years. She says she used to be okay with this but now being tired of hiding it. She also worries about her brother who is in prison and openly gay. Patient's reported symptoms of anxiety include "sweaty palms, hands tremble alot, voice is shakey, emotional outbursts of crying, increased heart rate".  Patient was last seen about 4 weeks ago by virtual visit. She reports increased sadness along with decreased energy and  poor motivation since last session.  She expresses frustration regarding the change in seasons and weather as she dislikes the cooler temperature.  She reports mainly staying in her room.  She reports anxiety related to getting flu shot as she fears possible Covid 19 exposure when she goes to the doctor's office.  Patient reports she has been using thought log periodically.   Suicidal/Homicidal: Nowithout intent/plan  Therapist Response:  Reviewed symptoms, discussed stressors, facilitated expression of thoughts and feelings, validated feelings, reviewed the role of behavioral activation, assisted patient develop plan to go outside 15 to 20 minutes a day dressed appropriately for the weather, also assisted patient to identify and develop plan to participate in activities at designated time if the weather conditions do not permit her to go outside, assisted patient examine her thought patterns evoking anxiety and replace with more helpful thoughts, discussed living with uncertainty, reviewed use of thought log and assigned patient to complete thought log once daily   Plan: Return again in 2 weeks.  Diagnosis: Axis I: Generalized Anxiety Disorder    Axis II: Deferred    [name], LCSW 05/11/2019

## 2019-05-23 ENCOUNTER — Telehealth (INDEPENDENT_AMBULATORY_CARE_PROVIDER_SITE_OTHER): Payer: Medicare Other | Admitting: Family Medicine

## 2019-05-23 ENCOUNTER — Other Ambulatory Visit: Payer: Self-pay

## 2019-05-23 ENCOUNTER — Encounter: Payer: Self-pay | Admitting: Family Medicine

## 2019-05-23 VITALS — BP 108/78 | Ht 67.0 in | Wt 152.0 lb

## 2019-05-23 DIAGNOSIS — J4540 Moderate persistent asthma, uncomplicated: Secondary | ICD-10-CM | POA: Diagnosis not present

## 2019-05-23 DIAGNOSIS — H608X3 Other otitis externa, bilateral: Secondary | ICD-10-CM | POA: Diagnosis not present

## 2019-05-23 DIAGNOSIS — F411 Generalized anxiety disorder: Secondary | ICD-10-CM

## 2019-05-23 DIAGNOSIS — J302 Other seasonal allergic rhinitis: Secondary | ICD-10-CM

## 2019-05-23 DIAGNOSIS — E785 Hyperlipidemia, unspecified: Secondary | ICD-10-CM | POA: Diagnosis not present

## 2019-05-23 DIAGNOSIS — F321 Major depressive disorder, single episode, moderate: Secondary | ICD-10-CM | POA: Diagnosis not present

## 2019-05-23 MED ORDER — FLUTICASONE PROPIONATE 50 MCG/ACT NA SUSP: 2.0000 | Freq: Every day | NASAL | 3 refills | Status: DC

## 2019-05-23 MED ORDER — SERTRALINE HCL 25 MG PO TABS: 25.0000 mg | ORAL_TABLET | Freq: Every day | ORAL | 5 refills | Status: DC

## 2019-05-23 NOTE — Patient Instructions (Addendum)
F/U with video visit 2nd week in December, f/u depression  Start zoloft 25 mg daily  Flu vaccine to be arranged to be given in your van  Drops for ear will be prescribed  Fasting CBC, lipid, cmp and EGFR, TSH vit D in early March 

## 2019-05-23 NOTE — Progress Notes (Signed)
Virtual Visit via Telephone Note  I connected with Janet Acevedo on 05/23/19 at  1:00 PM EDT by telephone and verified that I am speaking with the correct person using two identifiers.  Location: Patient: home Provider: office   I discussed the limitations, risks, security and privacy concerns of performing an evaluation and management service by telephone and the availability of in person appointments. I also discussed with the patient that there may be a patient responsible charge related to this service. The patient expressed understanding and agreed to proceed.   History of Present Illness: 4 week h/o increased and uncontrolled allergy symptoms no fever or chills Denies recent fever or chills. Denies sinus pressure, nasal congestion, ear pain or sore throat. Denies chest congestion, productive cough or wheezing. Denies chest pains, palpitations and leg swelling Denies abdominal pain, nausea, vomiting,diarrhea or constipation.   Denies dysuria, frequency, hesitancy or incontinence. Denies headaches, seizures, numbness, or tingling. C/o increased  Depression and  anxiety not suicidal or homicidal C/o dry skin in the ears and wants medication for this    Observations/Objective: BP 108/78   Ht 5\' 7"  (1.702 m)   Wt 152 lb (68.9 kg)   BMI 23.81 kg/m  Good communication with no confusion and intact memory. Alert and oriented x 3 No signs of respiratory distress during speech    Assessment and Plan:  Anxiety disorder Uncontrolled with covid 19, resume zoloft  Seasonal allergies Increased symptoms as expected with season change , continue chronic medication  Moderate persistent asthma Controlled, no change in medication   Dyslipidemia Hyperlipidemia:Low fat diet discussed and encouraged.   Lipid Panel  Lab Results  Component Value Date   CHOL 197 10/06/2018   HDL 43 (L) 10/06/2018   LDLCALC 140 (H) 10/06/2018   TRIG 53 10/06/2018   CHOLHDL 4.6 10/06/2018      Updated lab needed at/ before next visit.   Otitis externa, eczematoid Acute flare, topical medication for 1 week, then, as needed    Follow Up Instructions:    I discussed the assessment and treatment plan with the patient. The patient was provided an opportunity to ask questions and all were answered. The patient agreed with the plan and demonstrated an understanding of the instructions.   The patient was advised to call back or seek an in-person evaluation if the symptoms worsen or if the condition fails to improve as anticipated.  I provided 22 minutes of non-face-to-face time during this encounter.   Tula Nakayama, MD

## 2019-05-25 ENCOUNTER — Ambulatory Visit: Payer: Medicare Other

## 2019-05-25 ENCOUNTER — Ambulatory Visit (HOSPITAL_COMMUNITY): Payer: Medicare Other | Admitting: Psychiatry

## 2019-05-28 ENCOUNTER — Encounter: Payer: Self-pay | Admitting: Family Medicine

## 2019-05-28 DIAGNOSIS — H60549 Acute eczematoid otitis externa, unspecified ear: Secondary | ICD-10-CM | POA: Insufficient documentation

## 2019-05-28 HISTORY — DX: Acute eczematoid otitis externa, unspecified ear: H60.549

## 2019-05-28 MED ORDER — HYDROCORTISONE-ACETIC ACID 1-2 % OT SOLN: OTIC | 0 refills | Status: DC

## 2019-05-28 NOTE — Assessment & Plan Note (Signed)
Acute flare, topical medication for 1 week, then, as needed

## 2019-05-28 NOTE — Assessment & Plan Note (Signed)
Increased symptoms as expected with season change , continue chronic medication

## 2019-05-28 NOTE — Assessment & Plan Note (Signed)
Hyperlipidemia:Low fat diet discussed and encouraged.   Lipid Panel  Lab Results  Component Value Date   CHOL 197 10/06/2018   HDL 43 (L) 10/06/2018   LDLCALC 140 (H) 10/06/2018   TRIG 53 10/06/2018   CHOLHDL 4.6 10/06/2018     Updated lab needed at/ before next visit.

## 2019-05-28 NOTE — Assessment & Plan Note (Signed)
Uncontrolled with covid 19, resume zoloft

## 2019-05-28 NOTE — Assessment & Plan Note (Signed)
Controlled, no change in medication  

## 2019-06-07 ENCOUNTER — Other Ambulatory Visit: Payer: Self-pay | Admitting: Family Medicine

## 2019-06-08 ENCOUNTER — Ambulatory Visit (INDEPENDENT_AMBULATORY_CARE_PROVIDER_SITE_OTHER): Payer: Medicaid Other | Admitting: Psychiatry

## 2019-06-08 ENCOUNTER — Other Ambulatory Visit: Payer: Self-pay

## 2019-06-08 DIAGNOSIS — F411 Generalized anxiety disorder: Secondary | ICD-10-CM

## 2019-06-08 NOTE — Progress Notes (Signed)
Virtual Visit via Video Note  I connected with Janet Acevedo on 06/08/19 at  1:00 PM EST by a video enabled telemedicine application and verified that I am speaking with the correct person using two identifiers.   I discussed the limitations of evaluation and management by telemedicine and the availability of in person appointments. The patient expressed understanding and agreed to proceed.    I provided 27minutes of non-face-to-face time during this encounter.   [name], LCSW         '      THERAPIST PROGRESS NOTE  Session Time: Thursday 06/08/2019 1:10 PM - 1:55 PM    Participation Level: Active  Behavioral Response: CasualAlertAnxious  Type of Therapy: Individual Therapy      Treatment Goals addressed:  learn and implement cognitive and behavioral strategies to cope with anxiety  Interventions: CBT and Supportive  Summary: Janet Acevedo is a 28 y.o. female who is referred for services by psychiatrist Dr. Modesta Messing due to patient experiencing symptoms of anxiety. She denies any psychiatric hospitalizations and no previous involvement in psychotherapy.  patient reports having uncontrollable anxiety and finding herself in an anxious state every day. She reports having anxiety since childhood but has worsened in the last 1-2 years. She reports stress regarding an aunt who is a quadriplegic as her health has fluctuated. Patient reports being very close with his aunt. She also reports dealing with her sexuality and says she has had a girlfriend secretly for the past 8 years. She says she used to be okay with this but now being tired of hiding it. She also worries about her brother who is in prison and openly gay. Patient's reported symptoms of anxiety include "sweaty palms, hands tremble alot, voice is shakey, emotional outbursts of crying, increased heart rate".  Patient was last seen about 4 weeks ago by virtual visit. She reports decreased isolative behaviors and increased social  involvement with her family. She also reports she implemented plan of going outside when weather permitted and states feeling less depressed. She also started engaging in activities inside like polishing her nails. She continues to experienced worry and anxiety about possible exposure to COVID.  However, she called her PCP and requested receiving her flu shot while she remained in her vehicle.  She plans to get the flu shot next week.  She also talked with PCP about her anxiety and agreed to began taking Zoloft.  Medication was prescribed 2 weeks ago but patient has not started taking it.  She expresses reluctance as she fears it will cause her to feel funny or she may have some enduring negative effects if she was to discontinue the medication once started.   Suicidal/Homicidal: Nowithout intent/plan  Therapist Response:  Reviewed symptoms, praised and reinforced patient's increased behavioral activation and increased socialization, discussed effects, discussed stressors, facilitated expression of thoughts and feelings, validated feelings, assisted patient identify her thought patterns of demanding certainty regarding taking medication and the outcome, assisted patient identify cost related to demanding certainty, began to identify ways to increase tolerance of uncertainty, assigned patient to continue using thought logs    Plan: Return again in 2 weeks.  Diagnosis: Axis I: Generalized Anxiety Disorder    Axis II: Deferred    [name], LCSW 06/08/2019

## 2019-06-15 ENCOUNTER — Other Ambulatory Visit: Payer: Self-pay | Admitting: Family Medicine

## 2019-06-19 ENCOUNTER — Telehealth (HOSPITAL_COMMUNITY): Payer: Self-pay | Admitting: Psychiatry

## 2019-06-19 ENCOUNTER — Other Ambulatory Visit: Payer: Self-pay

## 2019-06-19 ENCOUNTER — Ambulatory Visit (HOSPITAL_COMMUNITY): Payer: Medicare Other | Admitting: Psychiatry

## 2019-06-19 NOTE — Telephone Encounter (Signed)
Therapist attempted to contact patient twice via text through [email or web address] platform, no response.  Therapist called patient and left message indicating attempt and requesting patient call office.

## 2019-06-20 ENCOUNTER — Other Ambulatory Visit: Payer: Self-pay

## 2019-06-20 ENCOUNTER — Ambulatory Visit (INDEPENDENT_AMBULATORY_CARE_PROVIDER_SITE_OTHER): Payer: Medicare Other

## 2019-06-20 DIAGNOSIS — Z23 Encounter for immunization: Secondary | ICD-10-CM | POA: Diagnosis not present

## 2019-07-06 ENCOUNTER — Other Ambulatory Visit: Payer: Self-pay

## 2019-07-06 ENCOUNTER — Ambulatory Visit (INDEPENDENT_AMBULATORY_CARE_PROVIDER_SITE_OTHER): Payer: Medicare Other | Admitting: Psychiatry

## 2019-07-06 DIAGNOSIS — F411 Generalized anxiety disorder: Secondary | ICD-10-CM | POA: Diagnosis not present

## 2019-07-06 NOTE — Progress Notes (Signed)
Virtual Visit via Video Note  I connected with [name] on 07/06/19 at 1:10 PM EST by a video enabled telemedicine application and verified that I am speaking with the correct person using two identifiers.   I discussed the limitations of evaluation and management by telemedicine and the availability of in person appointments. The patient expressed understanding and agreed to proceed.  I provided 45 minutes of non-face-to-face time during this encounter.   [name], LCSW        '      THERAPIST PROGRESS NOTE  Session Time: Thursday 07/06/2019 1:10 PM - 1:55 PM     Participation Level: Active  Behavioral Response: CasualAlertAnxious  Type of Therapy: Individual Therapy      Treatment Goals addressed:  learn and implement cognitive and behavioral strategies to cope with anxiety  Interventions: CBT and Supportive   Summary: GENISHA LAZUR is a 28 y.o. female who is referred for services by psychiatrist Dr. Modesta Messing due to patient experiencing symptoms of anxiety. She denies any psychiatric hospitalizations and no previous involvement in psychotherapy.  patient reports having uncontrollable anxiety and finding herself in an anxious state every day. She reports having anxiety since childhood but has worsened in the last 1-2 years. She reports stress regarding an aunt who is a quadriplegic as her health has fluctuated. Patient reports being very close with his aunt. She also reports dealing with her sexuality and says she has had a girlfriend secretly for the past 8 years. She says she used to be okay with this but now being tired of hiding it. She also worries about her brother who is in prison and openly gay. Patient's reported symptoms of anxiety include "sweaty palms, hands tremble alot, voice is shakey, emotional outbursts of crying, increased heart rate".  Patient was last seen about 3 weeks ago by virtual visit. She reports increased irritability since last session. She states  she has been snappy at others. Upon further discussion, patient reports being anxious and frustrated  extended family members are visiting her home more frequently and don't adhere to social distancing recommendations while there. She talked with her mother about this several weeks ago but says mother didn't do anything about it and seems to dismiss patient's feelings. Patient reports long time pattern of having difficulty being assertive especially with her partner and her mother. She still has not taken medication as prescribed by PCP as she continues to fear possible negative effects of medication.  Suicidal/Homicidal: Nowithout intent/plan  Therapist Response:  Reviewed symptoms, reviewed treatment plan, obtained patient's permission to initial review as this was a virtual visit, discussed stressors, facilitated expression of thoughts and feelings, validated feelings,assisted patient examine her pattern of interaction with mother, assisted patient identify and verbalize feelings regarding response from mother regarding limiting visits from family, discussed connection between underlying anger and irritability, began to discuss thoughts that inhibit effective assertion, assigned patient to list goals she would like to pursue regarding interaction with mother/partner in preparation for next session     Plan: Return again in 2 weeks.  Diagnosis: Axis I: Generalized Anxiety Disorder    Axis II: Deferred    [name], LCSW 07/06/2019

## 2019-07-11 ENCOUNTER — Other Ambulatory Visit: Payer: Self-pay

## 2019-07-11 ENCOUNTER — Encounter: Payer: Self-pay | Admitting: Family Medicine

## 2019-07-11 ENCOUNTER — Telehealth (INDEPENDENT_AMBULATORY_CARE_PROVIDER_SITE_OTHER): Payer: Medicare Other | Admitting: Family Medicine

## 2019-07-11 VITALS — BP 108/78 | Ht 67.0 in | Wt 152.0 lb

## 2019-07-11 DIAGNOSIS — E559 Vitamin D deficiency, unspecified: Secondary | ICD-10-CM

## 2019-07-11 DIAGNOSIS — J4540 Moderate persistent asthma, uncomplicated: Secondary | ICD-10-CM | POA: Diagnosis not present

## 2019-07-11 DIAGNOSIS — G121 Other inherited spinal muscular atrophy: Secondary | ICD-10-CM

## 2019-07-11 DIAGNOSIS — F321 Major depressive disorder, single episode, moderate: Secondary | ICD-10-CM

## 2019-07-11 DIAGNOSIS — F411 Generalized anxiety disorder: Secondary | ICD-10-CM

## 2019-07-11 DIAGNOSIS — F419 Anxiety disorder, unspecified: Secondary | ICD-10-CM

## 2019-07-11 DIAGNOSIS — E785 Hyperlipidemia, unspecified: Secondary | ICD-10-CM

## 2019-07-11 DIAGNOSIS — J302 Other seasonal allergic rhinitis: Secondary | ICD-10-CM | POA: Diagnosis not present

## 2019-07-11 DIAGNOSIS — Z13220 Encounter for screening for lipoid disorders: Secondary | ICD-10-CM

## 2019-07-11 NOTE — Progress Notes (Signed)
Virtual Visit via Telephone Note  I connected with Janet Acevedo on 07/11/19 at  2:40 PM EST by telephone and verified that I am speaking with the correct person using two identifiers.  Location: Patient: HOME Provider: OFFICE   I discussed the limitations, risks, security and privacy concerns of performing an evaluation and management service by telephone and the availability of in person appointments. I also discussed with the patient that there may be a patient responsible charge related to this service. The patient expressed understanding and agreed to proceed.   History of Present Illness: F/U chronic problems Overall doing well, still has not started anti depressant though she agrees and believes that she needs this. Tired of being home bound and increased anxiety over the worry of getting sick , benefiting from therapy and will continue this Denies recent fever or chills. Denies sinus pressure, nasal congestion, ear pain or sore throat. Denies chest congestion, productive cough or wheezing. Denies chest pains, palpitations and leg swelling Denies abdominal pain, nausea, vomiting,diarrhea or constipation.   Denies dysuria, frequency, hesitancy or incontinence. . Denies skin break down or rash.       Observations/Objective: BP 108/78   Ht 5\' 7"  (1.702 m)   Wt 152 lb (68.9 kg)   BMI 23.81 kg/m  Good communication with no confusion and intact memory. Alert and oriented x 3 No signs of respiratory distress during speech    Assessment and Plan:  Seasonal allergies Controlled, no change in medication   Moderate persistent asthma Controlled, no change in medication   Depression, major, single episode, moderate (HCC) Uncontrolled ,needs ot start medication prescribed and will continue therapy, not suicidal or homicidal  Dyslipidemia Hyperlipidemia:Low fat diet discussed and encouraged.   Lipid Panel  Lab Results  Component Value Date   CHOL 197 10/06/2018   HDL 43 (L) 10/06/2018   LDLCALC 140 (H) 10/06/2018   TRIG 53 10/06/2018   CHOLHDL 4.6 10/06/2018   Uncontrolled, needs to lower fat intake  Updated lab needed at/ before next visit.     Type II spinal muscular atrophy (HCC) Chronically dependent on mobility device ,also has limitation in upper extremity movement  Anxiety disorder Needs improvement in control, start zoloft prescribed   Follow Up Instructions:    I discussed the assessment and treatment plan with the patient. The patient was provided an opportunity to ask questions and all were answered. The patient agreed with the plan and demonstrated an understanding of the instructions.   The patient was advised to call back or seek an in-person evaluation if the symptoms worsen or if the condition fails to improve as anticipated.  I provided 25 minutes of non-face-to-face time during this encounter.   Tula Nakayama, MD

## 2019-07-11 NOTE — Patient Instructions (Addendum)
F/U in office with MD mid May, , call if you need me before  Please get fasting labs firsrt week in May, cBC, lipid, cmp and eGFR, tSH and vit D  Continue to practice safe health habits to reduce your risk of exposure to Covid  Please start taking the sertraline once daily, as prescribed  Thanks for choosing [facility], we consider it a privelige to serve you.

## 2019-07-17 ENCOUNTER — Encounter: Payer: Self-pay | Admitting: Family Medicine

## 2019-07-17 NOTE — Assessment & Plan Note (Signed)
Controlled, no change in medication  

## 2019-07-17 NOTE — Assessment & Plan Note (Signed)
Chronically dependent on mobility device ,also has limitation in upper extremity movement

## 2019-07-17 NOTE — Assessment & Plan Note (Signed)
Needs improvement in control, start zoloft prescribed

## 2019-07-17 NOTE — Assessment & Plan Note (Signed)
Hyperlipidemia:Low fat diet discussed and encouraged.   Lipid Panel  Lab Results  Component Value Date   CHOL 197 10/06/2018   HDL 43 (L) 10/06/2018   LDLCALC 140 (H) 10/06/2018   TRIG 53 10/06/2018   CHOLHDL 4.6 10/06/2018   Uncontrolled, needs to lower fat intake  Updated lab needed at/ before next visit.

## 2019-07-17 NOTE — Assessment & Plan Note (Signed)
Uncontrolled ,needs ot start medication prescribed and will continue therapy, not suicidal or homicidal

## 2019-08-03 ENCOUNTER — Ambulatory Visit (HOSPITAL_COMMUNITY): Payer: Medicare Other | Admitting: Psychiatry

## 2019-08-16 ENCOUNTER — Other Ambulatory Visit: Payer: Self-pay | Admitting: Family Medicine

## 2019-08-22 ENCOUNTER — Other Ambulatory Visit: Payer: Self-pay

## 2019-08-22 ENCOUNTER — Ambulatory Visit (INDEPENDENT_AMBULATORY_CARE_PROVIDER_SITE_OTHER): Payer: Medicare Other | Admitting: Psychiatry

## 2019-08-22 DIAGNOSIS — F411 Generalized anxiety disorder: Secondary | ICD-10-CM

## 2019-08-22 NOTE — Progress Notes (Signed)
Virtual Visit via Video Note  I connected with Janet Acevedo on 08/22/19 at 1:15 PM EST by a video enabled telemedicine application and verified that I am speaking with the correct person using two identifiers.   I discussed the limitations of evaluation and management by telemedicine and the availability of in person appointments. The patient expressed understanding and agreed to proceed.   I provided 45 minutes of non-face-to-face time during this encounter.   [name], LCSW       '      THERAPIST PROGRESS NOTE  Session Time: Tuesday 08/22/2019 1:15 PM -  2:00 PM     Participation Level: Active  Behavioral Response: CasualAlertAnxious  Type of Therapy: Individual Therapy      Treatment Goals addressed:  learn and implement cognitive and behavioral strategies to cope with anxiety  Interventions: CBT and Supportive   Summary: Janet Acevedo is a 29 y.o. female who is referred for services by psychiatrist Dr. Modesta Messing due to patient experiencing symptoms of anxiety. She denies any psychiatric hospitalizations and no previous involvement in psychotherapy.  patient reports having uncontrollable anxiety and finding herself in an anxious state every day. She reports having anxiety since childhood but has worsened in the last 1-2 years. She reports stress regarding an aunt who is a quadriplegic as her health has fluctuated. Patient reports being very close with his aunt. She also reports dealing with her sexuality and says she has had a girlfriend secretly for the past 8 years. She says she used to be okay with this but now being tired of hiding it. She also worries about her brother who is in prison and openly gay. Patient's reported symptoms of anxiety include "sweaty palms, hands tremble alot, voice is shakey, emotional outbursts of crying, increased heart rate".  Patient was last seen about 5 weeks ago by virtual visit. She reports increased anxiety, tearfulness, and worry since last  session.  Trigger was patient learning her partner recently contacted a person she was involved within the past.  This is the second time this is happened and the past several months per patient's report.  She expresses sadness and frustration partner is not sensitive to patient's feelings about this per her report.  Patient reports expressing concerns and feelings assertively to partner but reports partner will not take responsibility for her actions.  She also expresses sadness she is not able to share concerns about the relationship with her mother as her mother does not know patient's sexual preference.  She expresses ambivalent feelings about possibly talking with mother.  Patient reports she started taking sertraline 4 days ago to help manage anxiety.  She is very pleased with her decision and action regarding the medication.  She reports noticing that she has started to feel more calm and now is better able to think about the situation with her partner.  She reports having some side effects of nausea but states wanting to stick with the medication.  She expresses desire to follow-up with Dr. Modesta Messing regarding medication.    Suicidal/Homicidal: Nowithout intent/plan  Therapist Response:  Reviewed symptoms, praised and reinforced patient's efforts to try medication, discussed following up with PCP and scheduling an appointment with Dr. Modesta Messing, discussed stressors, facilitated expression of thoughts and feelings, validated feelings, assisted patient identify and verbalize feelings about the relationship with her partner and recent incident, assisted patient began to identify goals in the relationship with her mother and with her partner consistent with patient's values, discussed  possibility of patient writing a letter to mother to express her feelings and concerns but not actually giving letter to mother   Plan: Return again in 2 weeks.  Diagnosis: Axis I: Generalized Anxiety Disorder    Axis II:  Deferred    [name], LCSW 08/22/2019

## 2019-08-23 ENCOUNTER — Other Ambulatory Visit: Payer: Self-pay | Admitting: Family Medicine

## 2019-08-25 ENCOUNTER — Encounter: Payer: Self-pay | Admitting: Family Medicine

## 2019-08-29 ENCOUNTER — Other Ambulatory Visit: Payer: Self-pay

## 2019-08-29 ENCOUNTER — Ambulatory Visit (INDEPENDENT_AMBULATORY_CARE_PROVIDER_SITE_OTHER): Payer: Medicare Other | Admitting: Family Medicine

## 2019-08-29 VITALS — BP 108/78 | HR 88 | Ht 67.0 in | Wt 152.0 lb

## 2019-08-29 DIAGNOSIS — L219 Seborrheic dermatitis, unspecified: Secondary | ICD-10-CM

## 2019-08-29 DIAGNOSIS — J4540 Moderate persistent asthma, uncomplicated: Secondary | ICD-10-CM

## 2019-08-29 DIAGNOSIS — F321 Major depressive disorder, single episode, moderate: Secondary | ICD-10-CM | POA: Diagnosis not present

## 2019-08-29 DIAGNOSIS — E785 Hyperlipidemia, unspecified: Secondary | ICD-10-CM | POA: Diagnosis not present

## 2019-08-29 DIAGNOSIS — K219 Gastro-esophageal reflux disease without esophagitis: Secondary | ICD-10-CM | POA: Diagnosis not present

## 2019-08-29 DIAGNOSIS — F411 Generalized anxiety disorder: Secondary | ICD-10-CM

## 2019-08-29 DIAGNOSIS — R110 Nausea: Secondary | ICD-10-CM

## 2019-08-29 MED ORDER — FLUOCINOLONE ACETONIDE 0.01 % OT OIL: TOPICAL_OIL | OTIC | 1 refills | Status: DC

## 2019-08-29 MED ORDER — ONDANSETRON HCL 4 MG PO TABS: ORAL_TABLET | ORAL | 1 refills | Status: DC

## 2019-08-29 NOTE — Progress Notes (Signed)
Virtual Visit via Telephone Note  I connected with Janet Acevedo on 08/29/19 at  1:20 PM EST by telephone and verified that I am speaking with the correct person using two identifiers.  Location: Patient: home Provider: offfice   I discussed the limitations, risks, security and privacy concerns of performing an evaluation and management service by telephone and the availability of in person appointments. I also discussed with the patient that there may be a patient responsible charge related to this service. The patient expressed understanding and agreed to proceed.   History of Present Illness: F/U chronic problems, medication review, and refill medication when necessary. Review most recent labs and order labs which are due Review preventive health and update with necessary referrals or immunizations as indicated C/o mild nausea and indigestion after starting SSRI, wants to give herself time for side effect improvement as she notes improvement in her mood and anxiety level C/o scaly  scalp, wants medication for this which has helped in the past   Observations/Objective: BP 108/78   Pulse 88   Ht 5\' 7"  (1.702 m)   Wt 152 lb (68.9 kg)   BMI 23.81 kg/m  BP 108/78   Pulse 88   Ht 5\' 7"  (1.702 m)   Wt 152 lb (68.9 kg)   BMI 23.81 kg/m   Good communication with no confusion and intact memory. Alert and oriented x 3 No signs of respiratory distress during speech    Assessment and Plan: Seborrheic dermatitis of scalp Uncontrolled with flaking, rx prescribed  Moderate persistent asthma Controlled, no change in medication   Depression, major, single episode, moderate (HCC) Improved on medication, will monitor side effect and see if able to tolerated, continue medication   Anxiety disorder Continue medication  GERD Controlled, no change in medication   Dyslipidemia Hyperlipidemia:Low fat diet discussed and encouraged.   Lipid Panel  Lab Results  Component Value  Date   CHOL 197 10/06/2018   HDL 43 (L) 10/06/2018   LDLCALC 140 (H) 10/06/2018   TRIG 53 10/06/2018   CHOLHDL 4.6 10/06/2018       Nausea Zofran prescribed for as needed use     Follow Up Instructions:    I discussed the assessment and treatment plan with the patient. The patient was provided an opportunity to ask questions and all were answered. The patient agreed with the plan and demonstrated an understanding of the instructions.   The patient was advised to call back or seek an in-person evaluation if the symptoms worsen or if the condition fails to improve as anticipated.  I provided 15 minutes of non-face-to-face time during this encounter.   Tula Nakayama, MD

## 2019-08-29 NOTE — Patient Instructions (Signed)
F/U with MD on phone in 2 months, call if you need me before  Zofran and scalp oil are prescribed as discussed  Think about what you will eat, plan ahead. Choose " clean, green, fresh or frozen" over canned, processed or packaged foods which are more sugary, salty and fatty. 70 to 75% of food eaten should be vegetables and fruit. Three meals at set times with snacks allowed between meals, but they must be fruit or vegetables. Aim to eat over a 12 hour period , example 7 am to 7 pm, and STOP after  your last meal of the day. Drink water,generally about 64 ounces per day, no other drink is as healthy. Fruit juice is best enjoyed in a healthy way, by EATING the fruit.   Thanks for choosing [facility], we consider it a privelige to serve you.

## 2019-09-06 ENCOUNTER — Encounter: Payer: Self-pay | Admitting: Family Medicine

## 2019-09-06 NOTE — Assessment & Plan Note (Signed)
Zofran prescribed for as needed use

## 2019-09-06 NOTE — Assessment & Plan Note (Signed)
Continue medication.

## 2019-09-06 NOTE — Assessment & Plan Note (Signed)
Controlled, no change in medication  

## 2019-09-06 NOTE — Assessment & Plan Note (Signed)
Uncontrolled with flaking, rx prescribed

## 2019-09-06 NOTE — Assessment & Plan Note (Signed)
Improved on medication, will monitor side effect and see if able to tolerated, continue medication

## 2019-09-06 NOTE — Assessment & Plan Note (Signed)
Hyperlipidemia:Low fat diet discussed and encouraged.   Lipid Panel  Lab Results  Component Value Date   CHOL 197 10/06/2018   HDL 43 (L) 10/06/2018   LDLCALC 140 (H) 10/06/2018   TRIG 53 10/06/2018   CHOLHDL 4.6 10/06/2018

## 2019-09-12 ENCOUNTER — Telehealth (HOSPITAL_COMMUNITY): Payer: Self-pay | Admitting: Psychiatry

## 2019-09-12 ENCOUNTER — Other Ambulatory Visit: Payer: Self-pay

## 2019-09-12 ENCOUNTER — Ambulatory Visit (HOSPITAL_COMMUNITY): Payer: Medicare Other | Admitting: Psychiatry

## 2019-09-12 NOTE — Telephone Encounter (Signed)
Therapist attempted to contact patient via phone call for scheduled appointment, received voice mail message. Therapist left message indicating attempt and requesting patient call office.

## 2019-09-19 ENCOUNTER — Ambulatory Visit (HOSPITAL_COMMUNITY): Payer: Medicare Other | Admitting: Psychiatry

## 2019-09-22 ENCOUNTER — Encounter: Payer: Self-pay | Admitting: Family Medicine

## 2019-09-22 ENCOUNTER — Other Ambulatory Visit: Payer: Self-pay | Admitting: Family Medicine

## 2019-09-25 ENCOUNTER — Other Ambulatory Visit: Payer: Self-pay

## 2019-09-25 MED ORDER — OMEPRAZOLE 20 MG PO CPDR: DELAYED_RELEASE_CAPSULE | ORAL | 3 refills | Status: DC

## 2019-09-27 ENCOUNTER — Encounter: Payer: Self-pay | Admitting: Family Medicine

## 2019-09-27 ENCOUNTER — Ambulatory Visit (INDEPENDENT_AMBULATORY_CARE_PROVIDER_SITE_OTHER): Payer: Medicare Other | Admitting: Family Medicine

## 2019-09-27 ENCOUNTER — Other Ambulatory Visit: Payer: Self-pay

## 2019-09-27 DIAGNOSIS — F411 Generalized anxiety disorder: Secondary | ICD-10-CM

## 2019-09-27 DIAGNOSIS — F321 Major depressive disorder, single episode, moderate: Secondary | ICD-10-CM

## 2019-09-27 MED ORDER — BUSPIRONE HCL 5 MG PO TABS: ORAL_TABLET | ORAL | 3 refills | Status: DC

## 2019-09-27 NOTE — Progress Notes (Signed)
Virtual Visit via Telephone Note  I connected with [name] on 09/27/19 at  2:20 PM EST by telephone and verified that I am speaking with the correct person using two identifiers.  Location: Patient: home Provider: office   I discussed the limitations, risks, security and privacy concerns of performing an evaluation and management service by telephone and the availability of in person appointments. I also discussed with the patient that there may be a patient responsible charge related to this service. The patient expressed understanding and agreed to proceed.   History of Present Illness: C/o increased and uncontrolled anxiety and uncontrolled depression. Not suicidal or homicidal, often overwhelmed, restriction of Covid is getting the better of her . Wants to and needs to re connect with Psychiatry for med management , is following up with her therapist   Observations/Objective: There were no vitals taken for this visit. Good communication with no confusion and intact memory. Alert and oriented x 3 No signs of respiratory distress during speech    Assessment and Plan: GAD (generalized anxiety disorder) increased and uncontrolled anxiety, start buspar once daily , increase to twice daily if needed  Depression, major, single episode, moderate (HCC) needs to re establish with Psych, will refer    Follow Up Instructions:    I discussed the assessment and treatment plan with the patient. The patient was provided an opportunity to ask questions and all were answered. The patient agreed with the plan and demonstrated an understanding of the instructions.   The patient was advised to call back or seek an in-person evaluation if the symptoms worsen or if the condition fails to improve as anticipated.  I provided 12 minutes of non-face-to-face time during this encounter.   Tula Nakayama, MD

## 2019-09-27 NOTE — Patient Instructions (Signed)
Keep April appointment  New for anxiety is buspar, start with one daily, and increase if needed to one twice daily, 12 hours apart  I am referring you to Dr Modesta Messing as we discussed  Thanks for choosing [facility], we consider it a privelige to serve you.

## 2019-09-28 ENCOUNTER — Other Ambulatory Visit: Payer: Self-pay

## 2019-09-28 ENCOUNTER — Telehealth: Payer: Self-pay

## 2019-09-28 MED ORDER — FLUOCINOLONE ACETONIDE 0.01 % OT OIL: TOPICAL_OIL | OTIC | 1 refills | Status: DC

## 2019-09-28 NOTE — Telephone Encounter (Signed)
Janet Acevedo is calling to clarify medication  Is it for the ear or scalp

## 2019-09-30 ENCOUNTER — Encounter: Payer: Self-pay | Admitting: Family Medicine

## 2019-09-30 NOTE — Assessment & Plan Note (Signed)
needs to re establish with Psych, will refer

## 2019-09-30 NOTE — Assessment & Plan Note (Signed)
increased and uncontrolled anxiety, start buspar once daily , increase to twice daily if needed

## 2019-10-10 ENCOUNTER — Encounter (HOSPITAL_COMMUNITY): Payer: Self-pay | Admitting: Psychiatry

## 2019-10-10 ENCOUNTER — Ambulatory Visit (INDEPENDENT_AMBULATORY_CARE_PROVIDER_SITE_OTHER): Payer: Medicare Other | Admitting: Psychiatry

## 2019-10-10 ENCOUNTER — Other Ambulatory Visit: Payer: Self-pay

## 2019-10-10 DIAGNOSIS — F411 Generalized anxiety disorder: Secondary | ICD-10-CM
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
## 2019-10-11 ENCOUNTER — Encounter: Payer: Self-pay | Admitting: Family Medicine

## 2019-10-13 ENCOUNTER — Encounter: Payer: Self-pay | Admitting: Family Medicine

## 2019-10-13 ENCOUNTER — Ambulatory Visit (INDEPENDENT_AMBULATORY_CARE_PROVIDER_SITE_OTHER): Payer: Medicare Other

## 2019-10-13 ENCOUNTER — Other Ambulatory Visit: Payer: Self-pay

## 2019-10-13 ENCOUNTER — Telehealth: Payer: Self-pay

## 2019-10-13 DIAGNOSIS — R300 Dysuria: Secondary | ICD-10-CM | POA: Diagnosis not present

## 2019-10-13 LAB — POCT URINALYSIS DIP (CLINITEK)
Bilirubin, UA: NEGATIVE
Glucose, UA: NEGATIVE mg/dL
Ketones, POC UA: NEGATIVE mg/dL
Leukocytes, UA: NEGATIVE
Nitrite, UA: NEGATIVE
POC PROTEIN,UA: NEGATIVE
Spec Grav, UA: >=1.03 — AB (ref 1.010–1.025)
Urobilinogen, UA: 0.2 E.U./dL
pH, UA: 6 (ref 5.0–8.0)

## 2019-10-13 NOTE — Telephone Encounter (Signed)
Open in error

## 2019-10-16 ENCOUNTER — Other Ambulatory Visit (HOSPITAL_COMMUNITY)
Admission: RE | Admit: 2019-10-16 | Discharge: 2019-10-16 | Disposition: A | Discharge status: Home / Self Care | Payer: Medicare Other | Source: Other Acute Inpatient Hospital | Attending: Family Medicine | Admitting: Family Medicine

## 2019-10-16 DIAGNOSIS — R300 Dysuria: Secondary | ICD-10-CM | POA: Insufficient documentation

## 2019-10-17 LAB — URINE CULTURE

## 2019-10-20 ENCOUNTER — Other Ambulatory Visit: Payer: Self-pay | Admitting: Family Medicine

## 2019-10-24 ENCOUNTER — Ambulatory Visit (INDEPENDENT_AMBULATORY_CARE_PROVIDER_SITE_OTHER): Payer: Medicare Other | Admitting: Psychiatry

## 2019-10-24 ENCOUNTER — Other Ambulatory Visit: Payer: Self-pay

## 2019-10-24 DIAGNOSIS — F411 Generalized anxiety disorder: Secondary | ICD-10-CM

## 2019-10-24 NOTE — Progress Notes (Signed)
Virtual Visit via Video Note  I connected with Janet Acevedo on 10/24/19 at 2:10 PM EDT by a video enabled telemedicine application and verified that I am speaking with the correct person using two identifiers.   I discussed the limitations of evaluation and management by telemedicine and the availability of in person appointments. The patient expressed understanding and agreed to proceed.  ils to improve as anticipated.  I provided 44 minutes of non-face-to-face time during this encounter.   [name], LCSW        '                THERAPIST PROGRESS NOTE  Session Time: Tuesday 10/24/2019 2:10 PM - 2:54 PM    Participation Level: Active  Behavioral Response: CasualAlertAnxious  Type of Therapy: Individual Therapy      Treatment Goals addressed:  learn and implement cognitive and behavioral strategies to cope with anxiety  Interventions: CBT and Supportive   Summary: Janet Acevedo is a 29 y.o. female who is referred for services by psychiatrist Dr. Modesta Messing due to patient experiencing symptoms of anxiety. She denies any psychiatric hospitalizations and no previous involvement in psychotherapy.  patient reports having uncontrollable anxiety and finding herself in an anxious state every day. She reports having anxiety since childhood but has worsened in the last 1-2 years. She reports stress regarding an aunt who is a quadriplegic as her health has fluctuated. Patient reports being very close with his aunt. She also reports dealing with her sexuality and says she has had a girlfriend secretly for the past 8 years. She says she used to be okay with this but now being tired of hiding it. She also worries about her brother who is in prison and openly gay. Patient's reported symptoms of anxiety include "sweaty palms, hands tremble alot, voice is shakey, emotional outbursts of crying, increased heart rate".  Patient was last seen about 2 weeks ago by virtual visit.  She continues to  experience moderate symptoms of anxiety but reports feeling much better.  She has improved her sleep hygiene and now is awake more during daylight hours.  She states feeling more happy and optimistic along with having more energy.  She is pleased she has maintained medication compliance and reports the medication has been very useful.  Patient reports spending less time along playing video games and spending more time engaging with her family.  She reports experiencing some anxiety worry taking the COVID-19 vaccine but has scheduled her appointment. She also is experiencing anxiety about goals for her life. She reports the life span for people with her condition is less than others. This causes fear about planning for the future. She states feeling as thought she is running out of time although her condition has been stable for a long time.  She also continues to express anxiety and worry about keeping her sexuality a secret.   Suicidal/Homicidal: Nowithout intent/plan  Therapist Response:  Reviewed symptoms, praised and reinforced patient's improved self-care efforts/medication compliance, discussed effects, encourage patient to continue efforts, discussed stressors, facilitated expression of thoughts and feelings, validated feelings, assisted patient examine her thought patterns about the vaccine/future, discussed tolerating uncertainty, developed plan with patient to identify specific life goals she would like to accomplish in the next 6 to 12 months in preparation for next session  Plan: Return again in 2 weeks.  Diagnosis: Axis I: Generalized Anxiety Disorder    Axis II: Deferred    [name], LCSW 10/24/2019

## 2019-10-30 ENCOUNTER — Ambulatory Visit: Payer: Medicare Other | Admitting: Family Medicine

## 2019-11-07 ENCOUNTER — Ambulatory Visit: Payer: Medicare Other | Admitting: Family Medicine

## 2019-11-13 DIAGNOSIS — Z23 Encounter for immunization: Secondary | ICD-10-CM | POA: Diagnosis not present

## 2019-11-14 ENCOUNTER — Telehealth (INDEPENDENT_AMBULATORY_CARE_PROVIDER_SITE_OTHER): Payer: Medicare Other | Admitting: Psychiatry

## 2019-11-14 ENCOUNTER — Encounter (HOSPITAL_COMMUNITY): Payer: Self-pay | Admitting: Psychiatry

## 2019-11-14 ENCOUNTER — Other Ambulatory Visit: Payer: Self-pay

## 2019-11-14 DIAGNOSIS — F411 Generalized anxiety disorder: Secondary | ICD-10-CM

## 2019-11-14 NOTE — Progress Notes (Signed)
Virtual Visit via Video Note  I connected with Janet Acevedo on 11/14/19 at 1:12 PM EDT  by a video enabled telemedicine application and verified that I am speaking with the correct person using two identifiers.   I discussed the limitations of evaluation and management by telemedicine and the availability of in person appointments. The patient expressed understanding and agreed to proceed.   I provided 40 minutes of non-face-to-face time during this encounter.   [name], LCSW             THERAPIST PROGRESS NOTE  Session Time: Tuesday  11/14/2019 1:12 PM - 1:52 PM      Participation Level: Active  Behavioral Response: CasualAlertAnxious  Type of Therapy: Individual Therapy      Treatment Goals addressed:  learn and implement cognitive and behavioral strategies to cope with anxiety  Interventions: CBT and Supportive   Summary: Janet Acevedo is a 29 y.o. female who is referred for services by psychiatrist Dr. Modesta Messing due to patient experiencing symptoms of anxiety. She denies any psychiatric hospitalizations and no previous involvement in psychotherapy.  patient reports having uncontrollable anxiety and finding herself in an anxious state every day. She reports having anxiety since childhood but has worsened in the last 1-2 years. She reports stress regarding an aunt who is a quadriplegic as her health has fluctuated. Patient reports being very close with his aunt. She also reports dealing with her sexuality and says she has had a girlfriend secretly for the past 8 years. She says she used to be okay with this but now being tired of hiding it. She also worries about her brother who is in prison and openly gay. Patient's reported symptoms of anxiety include "sweaty palms, hands tremble alot, voice is shakey, emotional outbursts of crying, increased heart rate".  Patient was last seen about 2 weeks ago by virtual visit.  She continues to experience moderate symptoms of anxiety.  She  reports increased stress and anxiety regarding  learning the identity of her biological father about 2 weeks ago through a text from her father.   She has known and had interaction with this man her entire life but has viewed him in a different role. She has not talked with her mother about this and does not know if mother is aware that patient now knows her father's identity. She states wanting to talk with mother about this eventually.   She reports now being more anxious when he is around be because she fears she may slip and say something that would indicate his identity to other family members.  She is pleased she is allowing herself to experience feelings including negative feelings without feeling guilty.  She has been trying to journal her feelings.   Suicidal/Homicidal: Nowithout intent/plan  Therapist Response:  Reviewed symptoms, discussed stressors, facilitated expression of thoughts and feelings, praised and reinforced patient's use of assertive skills and setting, maintaining limits with her biological father, praised and reinforced patient given self permission to acknowledge feelings, assisted patient identify feelings and discussed role of feelings to help patient communicate her needs and concerns effectively, developed plan with patient to continue journaling and to identify goals of having conversation with mother and goals of having conversation with father    Plan: Return again in 2 weeks.  Diagnosis: Axis I: Generalized Anxiety Disorder    Axis II: Deferred    [name], LCSW 11/14/2019

## 2019-11-22 DIAGNOSIS — Z23 Encounter for immunization: Secondary | ICD-10-CM | POA: Diagnosis not present

## 2019-11-23 ENCOUNTER — Other Ambulatory Visit: Payer: Self-pay | Admitting: Family Medicine

## 2019-11-27 ENCOUNTER — Encounter: Payer: Self-pay | Admitting: Family Medicine

## 2019-11-28 ENCOUNTER — Other Ambulatory Visit: Payer: Self-pay

## 2019-11-28 ENCOUNTER — Ambulatory Visit: Payer: Medicare Other | Admitting: Family Medicine

## 2019-11-28 ENCOUNTER — Ambulatory Visit (INDEPENDENT_AMBULATORY_CARE_PROVIDER_SITE_OTHER): Payer: Medicare Other | Admitting: Psychiatry

## 2019-11-28 DIAGNOSIS — F411 Generalized anxiety disorder: Secondary | ICD-10-CM

## 2019-11-28 NOTE — Progress Notes (Signed)
Virtual Visit via Video Note  I connected with Janet Acevedo on 11/28/19 at 2:15 PM EDT  by a video enabled telemedicine application and verified that I am speaking with the correct person using two identifiers.   I discussed the limitations of evaluation and management by telemedicine and the availability of in person appointments. The patient expressed understanding and agreed to proceed.   I provided 41 minutes of non-face-to-face time during this encounter.   [name], LCSW             THERAPIST PROGRESS NOTE  Session Time: Tuesday  11/28/2019 2:15 PM -  2:56 PM     Participation Level: Active  Behavioral Response: CasualAlertAnxious  Type of Therapy: Individual Therapy      Treatment Goals addressed:  learn and implement cognitive and behavioral strategies to cope with anxiety  Interventions: CBT and Supportive   Summary: Janet Acevedo is a 29 y.o. female who is referred for services by psychiatrist Dr. Modesta Messing due to patient experiencing symptoms of anxiety. She denies any psychiatric hospitalizations and no previous involvement in psychotherapy.  patient reports having uncontrollable anxiety and finding herself in an anxious state every day. She reports having anxiety since childhood but has worsened in the last 1-2 years. She reports stress regarding an aunt who is a quadriplegic as her health has fluctuated. Patient reports being very close with his aunt. She also reports dealing with her sexuality and says she has had a girlfriend secretly for the past 8 years. She says she used to be okay with this but now being tired of hiding it. She also worries about her brother who is in prison and openly gay. Patient's reported symptoms of anxiety include "sweaty palms, hands tremble alot, voice is shakey, emotional outbursts of crying, increased heart rate".  Patient was last seen about 2 weeks ago by virtual visit.  She continues to experience moderate symptoms of anxiety.  She  reports increased stress and anxiety triggered by recently getting the COVID-19 vaccine and increased visits from her biological father.  Patient reports she successfully used coping statements to cope with anxiety about the vaccine.  She continues to fear she may slip and say something that would indicate his identity to the family members.  She still has not yet talked with her mother about this knowledge but suspects mother is aware that she knows the identity of her father.  She continues to fear talking with mother about her concerns as she fears mother would perceive her as being disrespectful and give her the silent treatment.  Patient reports she has continue to journal her thoughts and feelings and says this has been helpful.   Suicidal/Homicidal: Nowithout intent/plan  Therapist Response:  Reviewed symptoms, facilitated expression of thoughts and feelings, praised and reinforced patient's use of journaling and coping statements, discussed stressors, facilitated expression of thoughts and feelings, validated feelings, assisted patient began to identify goals in the relationship with her mother, began to provide psychoeducation on assertiveness and discussed the use of iMessages to express feelings to mother, will send patient Handout on assertiveness in preparation for next session     Plan: Return again in 2 weeks.  Diagnosis: Axis I: Generalized Anxiety Disorder    Axis II: Deferred    [name], LCSW 11/28/2019

## 2019-12-08 ENCOUNTER — Encounter: Payer: Self-pay | Admitting: Family Medicine

## 2019-12-11 ENCOUNTER — Other Ambulatory Visit: Payer: Self-pay | Admitting: *Deleted

## 2019-12-11 MED ORDER — KETOCONAZOLE 2 % EX SHAM: MEDICATED_SHAMPOO | CUTANEOUS | 4 refills | Status: DC

## 2019-12-12 ENCOUNTER — Encounter (HOSPITAL_COMMUNITY): Payer: Self-pay | Admitting: Psychiatry

## 2019-12-12 ENCOUNTER — Ambulatory Visit (INDEPENDENT_AMBULATORY_CARE_PROVIDER_SITE_OTHER): Payer: Medicare Other | Admitting: Psychiatry

## 2019-12-12 ENCOUNTER — Other Ambulatory Visit: Payer: Self-pay

## 2019-12-12 DIAGNOSIS — F411 Generalized anxiety disorder: Secondary | ICD-10-CM | POA: Diagnosis not present

## 2019-12-12 NOTE — Progress Notes (Signed)
Virtual Visit via Video Note  I connected with Janet Acevedo on 12/12/19 at  1:00 PM EDT by a video enabled telemedicine application and verified that I am speaking with the correct person using two identifiers.   I discussed the limitations of evaluation and management by telemedicine and the availability of in person appointments. The patient expressed understanding and agreed to proceed.   I provided 50  minutes of non-face-to-face time during this encounter.   Janet Smoker, LCSW             THERAPIST PROGRESS NOTE  Session Time: Tuesday 12/12/2019 1:00 PM - 1:50 PM     Participation Level: Active  Behavioral Response: CasualAlertAnxious  Type of Therapy: Individual Therapy      Treatment Goals addressed:  learn and implement cognitive and behavioral strategies to cope with anxiety  Interventions: CBT and Supportive   Summary: Janet Acevedo is a 29 y.o. female who is referred for services by psychiatrist Dr. Modesta Messing due to patient experiencing symptoms of anxiety. She denies any psychiatric hospitalizations and no previous involvement in psychotherapy.  patient reports having uncontrollable anxiety and finding herself in an anxious state every day. She reports having anxiety since childhood but has worsened in the last 1-2 years. She reports stress regarding an aunt who is a quadriplegic as her health has fluctuated. Patient reports being very close with his aunt. She also reports dealing with her sexuality and says she has had a girlfriend secretly for the past 8 years. She says she used to be okay with this but now being tired of hiding it. She also worries about her brother who is in prison and openly gay. Patient's reported symptoms of anxiety include "sweaty palms, hands tremble alot, voice is shakey, emotional outbursts of crying, increased heart rate".  Patient was last seen about 2 weeks ago by virtual visit.  She experiences minimal symptoms of anxiety and reports decreased  worry. She reports improved efforts to identify/challenge/and replace anxiety provoking thoughts with alternative thoughts. She cites 2 examples including scheduling a salon appointment as well as scheduling an appointment to see her neurologist. Patient reports normally having spiraling thoughts about what if but recognizing her thought patterns this time along with successfully replacing with more helpful thoughts. She reports being excited about upcoming appointment to get a new hairstyle although she will be going to a new stylist. She also is excited about seeing her neurologist as she is hopeful about a new drug that has been approved to treat spinal muscular atrophy. She hopes to discuss her options at the appointment. She reports continued stress and anxiety regarding being assertive and setting limits in her relationships. She cites a recent example of an incident with her brother earlier this week. She expresses frustration with self that she was not more assertive in the situation.   Suicidal/Homicidal: Nowithout intent/plan  Therapist Response:  Reviewed symptoms, administered GAD-7, praised and reinforced patient's efforts to identify/challenge for/and replace anxiety provoking thoughts with alternative thoughts, discussed effects on her mood and behavior, assisted patient process thoughts and feelings about recent interaction with brother, assisted patient examine her pattern of interaction in the relationship with her brother, discussed possible ways patient could have responded, began to discuss assertive communication specifically focusing on what assertive communication is not, began to define assertive communication versus aggressive communication, develop plan with patient to continue to review handout in preparation for next session    Plan: Return again in 2 weeks.  Diagnosis: Axis I: Generalized Anxiety Disorder    Axis II: Deferred    Janet Smoker, LCSW 12/12/2019

## 2019-12-14 ENCOUNTER — Ambulatory Visit (INDEPENDENT_AMBULATORY_CARE_PROVIDER_SITE_OTHER): Payer: Medicare Other | Admitting: Family Medicine

## 2019-12-14 ENCOUNTER — Encounter: Payer: Self-pay | Admitting: Family Medicine

## 2019-12-14 ENCOUNTER — Other Ambulatory Visit: Payer: Self-pay

## 2019-12-14 VITALS — BP 108/78 | Ht 67.0 in | Wt 152.0 lb

## 2019-12-14 DIAGNOSIS — J4540 Moderate persistent asthma, uncomplicated: Secondary | ICD-10-CM | POA: Diagnosis not present

## 2019-12-14 DIAGNOSIS — G121 Other inherited spinal muscular atrophy: Secondary | ICD-10-CM | POA: Diagnosis not present

## 2019-12-14 DIAGNOSIS — F321 Major depressive disorder, single episode, moderate: Secondary | ICD-10-CM

## 2019-12-14 NOTE — Patient Instructions (Signed)
I appreciate the opportunity to provide you with care for your health and wellness. Today we discussed: asthma and alleriges  Follow up: 6 months in office   No labs or referrals today  Happy Birthday :)  Please continue to practice social distancing to keep you, your family, and our community safe.  If you must go out, please wear a mask and practice good handwashing.  It was a pleasure to see you and I look forward to continuing to work together on your health and well-being. Please do not hesitate to call the office if you need care or have questions about your care.  Have a wonderful day and week. With Gratitude, Cherly Beach, DNP, AGNP-BC

## 2019-12-14 NOTE — Assessment & Plan Note (Signed)
This is controlled there is no change in medication at this time.

## 2019-12-14 NOTE — Progress Notes (Signed)
Virtual Visit via Telephone Note   This visit type was conducted due to national recommendations for restrictions regarding the COVID-19 Pandemic (e.g. social distancing) in an effort to limit this patient's exposure and mitigate transmission in our community.  Due to her co-morbid illnesses, this patient is at least at moderate risk for complications without adequate follow up.  This format is felt to be most appropriate for this patient at this time.  The patient did not have access to video technology/had technical difficulties with video requiring transitioning to audio format only (telephone).  All issues noted in this document were discussed and addressed.  No physical exam could be performed with this format.    Evaluation Performed:  Follow-up visit  Date:  12/14/2019   ID:  Janet Acevedo, DOB 1991/01/28, MRN [id number]  Patient Location: Home Provider Location: Office  Location of Patient: Home Location of Provider: Telehealth Consent was obtain for visit to be over via telehealth. I verified that I am speaking with the correct person using two identifiers.  PCP:  Fayrene Helper, MD   Chief Complaint:  Chronic conditions  History of Present Illness:    Janet Acevedo is a 29 y.o. female with history of asthma, depression, GERD, neuromuscular disorder-SMA. Reports everything is going well. No complaints today over phone visit. No changes in health since last visit.  The patient does not have symptoms concerning for COVID-19 infection (fever, chills, cough, or new shortness of breath).   Past Medical, Surgical, Social History, Allergies, and Medications have been Reviewed.  Past Medical History:  Diagnosis Date  . Allergy   . Anxiety   . Asthma   . Depression, major, single episode, moderate ([place]) 09/05/2017  . GERD (gastroesophageal reflux disease)   . Neuromuscular disorder ([place])   . Osteomyelitis [facility]) June 2011  . Perennial allergic rhinitis   . Seborrheic  dermatitis of scalp 2006  . Spinal muscle atrophy ([place])    type 2/3 18 months   Past Surgical History:  Procedure Laterality Date  . bilateral hip reinsertion  under age 70   hips out of socket, pt was only able to cruise hold on and move short distances   . BONE BIOPSY     spinal bone   . MIRENA PLACED 10/27/10    . SPINAL FUSION  1999   rods placed in thoracolumbar spine to excessive scoliosis primarily      Current Meds  Medication Sig  . albuterol (ACCUNEB) 1.25 MG/3ML nebulizer solution TAKE 3 MLS (1.25 MG TOTAL) BY NEBULIZATION EVERY 4 (FOUR) HOURS AS NEEDED. DX J45.40  . albuterol (VENTOLIN HFA) 108 (90 Base) MCG/ACT inhaler INHALE 2 PUFFS INTO THE LUNGS EVERY 4 (FOUR) HOURS AS NEEDED FOR WHEEZING OR SHORTNESS OF BREATH.  . betamethasone dipropionate (DIPROLENE) 0.05 % cream APPLY TOPICALLY 2 (TWO) TIMES DAILY.  [name] Biotin (BIOTIN MAXIMUM STRENGTH) 10 MG TABS Take 1 tablet by mouth daily.  . busPIRone (BUSPAR) 5 MG tablet TAKE 1 TABLET BY MOUTH TWICE A DAY FOR ANXIETY  . cromolyn (OPTICROM) 4 % ophthalmic solution PLACE 1 DROP INTO BOTH EYES FOUR TIMES DAILY  . Fluocinolone Acetonide 0.01 % OIL Apply sparingly to scalp two times  Weekly, as needed, for itch and flaking  . fluticasone (FLONASE) 50 MCG/ACT nasal spray Place 2 sprays into both nostrils daily.  [name] ketoconazole (NIZORAL) 2 % cream APPLY 1 APPLICATION TOPICALLY AS NEEDED FOR DERMATITIS  . ketoconazole (NIZORAL) 2 % shampoo APPLY TO  SCALP FOR 5 MINUTES AND RINSE OUT  . levocetirizine (XYZAL) 5 MG tablet TAKE 1 TABLET BY MOUTH EVERY DAY IN THE EVENING  . montelukast (SINGULAIR) 10 MG tablet TAKE 1 TABLET BY MOUTH EVERY DAY  . nystatin (MYCOSTATIN/NYSTOP) powder APPLY TO AFFECTED AREA TWICE A DAY AS NEEDED  . omeprazole (PRILOSEC) 20 MG capsule THE PATIENT HAS TROUBLE SWALLOWING. PLEASE TAKE 2 CAPSULES TWICE A DAY BY MOUTH  . ondansetron (ZOFRAN) 4 MG tablet Take one tablet by mouth once daily , as needed, for nausea  .  Respiratory Therapy Supplies (NEBULIZER/ADULT MASK) KIT Insurance preference  . Respiratory Therapy Supplies (NEBULIZER/TUBING/MOUTHPIECE) KIT Use as directed. Insurance preference. Nebulizer kit and mask  . sertraline (ZOLOFT) 25 MG tablet TAKE 1 TABLET BY MOUTH EVERY DAY  . Spacer/Aero-Holding Chambers (AEROCHAMBER PLUS) inhaler Use as instructed  . SYMBICORT 160-4.5 MCG/ACT inhaler TAKE 2 PUFFS BY MOUTH TWICE A DAY  . UNABLE TO FIND Gloves- 1 box per month as needed  . UNABLE TO FIND Under pads- for use daily as needed  . UNABLE TO FIND Incontinence liners- use as needed for incontinence Gloves- 1 box as needed for incontinence DX urinary incontinence     Allergies:   Ciprofloxacin   ROS:   Please see the history of present illness.     All other systems reviewed and are negative.   Labs/Other Tests and Data Reviewed:    Recent Labs: No results found for requested labs within last 8760 hours.   Recent Lipid Panel Lab Results  Component Value Date/Time   CHOL 197 10/06/2018 01:53 PM   TRIG 53 10/06/2018 01:53 PM   HDL 43 (L) 10/06/2018 01:53 PM   CHOLHDL 4.6 10/06/2018 01:53 PM   LDLCALC 140 (H) 10/06/2018 01:53 PM    Wt Readings from Last 3 Encounters:  12/14/19 152 lb (68.9 kg)  08/29/19 152 lb (68.9 kg)  07/11/19 152 lb (68.9 kg)     Objective:    Vital Signs:  BP 108/78   Ht '5\' 7"'$  (1.702 m)   Wt 152 lb (68.9 kg)   BMI 23.81 kg/m    VITAL SIGNS:  reviewed GEN:  alert and oriented RESPIRATORY:  no shortness of breath in conversation  PSYCH:  normal affect and mood  ASSESSMENT & PLAN:   1. Moderate persistent asthma, unspecified whether complicated   2. Type II spinal muscular atrophy ([place])   3. Depression, major, single episode, moderate ([place])   Time:   Today, I have spent 7  minutes with the patient with telehealth technology discussing the above problems.     Medication Adjustments/Labs and Tests Ordered: Current medicines are reviewed at  length with the patient today.  Concerns regarding medicines are outlined above.   Tests Ordered: No orders of the defined types were placed in this encounter.   Medication Changes: No orders of the defined types were placed in this encounter.   Disposition:  Follow up 6 months Signed, Perlie Mayo, NP  12/14/2019 2:09 PM     [facility]

## 2019-12-14 NOTE — Assessment & Plan Note (Signed)
She reports that she is doing well and without any issues. Denies have any SI or HI today.

## 2019-12-14 NOTE — Assessment & Plan Note (Signed)
Chronically dependent on mobile device.  Has limitation in upper extremity movement.  She denies having any issues or concerns today and reports that she is doing well overall.

## 2019-12-17 ENCOUNTER — Other Ambulatory Visit: Payer: Self-pay | Admitting: Family Medicine

## 2019-12-22 ENCOUNTER — Encounter: Payer: Self-pay | Admitting: Family Medicine

## 2019-12-28 ENCOUNTER — Ambulatory Visit: Payer: Medicare Other | Admitting: Family Medicine

## 2020-01-01 ENCOUNTER — Encounter: Payer: Self-pay | Admitting: Family Medicine

## 2020-01-02 ENCOUNTER — Telehealth: Payer: Medicare Other

## 2020-01-03 ENCOUNTER — Other Ambulatory Visit: Payer: Self-pay

## 2020-01-03 ENCOUNTER — Telehealth (INDEPENDENT_AMBULATORY_CARE_PROVIDER_SITE_OTHER): Payer: Medicare Other | Admitting: Family Medicine

## 2020-01-03 ENCOUNTER — Encounter: Payer: Self-pay | Admitting: Family Medicine

## 2020-01-03 VITALS — Ht 67.0 in | Wt 152.0 lb

## 2020-01-03 DIAGNOSIS — F321 Major depressive disorder, single episode, moderate: Secondary | ICD-10-CM | POA: Diagnosis not present

## 2020-01-03 DIAGNOSIS — Z993 Dependence on wheelchair: Secondary | ICD-10-CM | POA: Diagnosis not present

## 2020-01-03 DIAGNOSIS — R7301 Impaired fasting glucose: Secondary | ICD-10-CM | POA: Diagnosis not present

## 2020-01-03 DIAGNOSIS — F411 Generalized anxiety disorder: Secondary | ICD-10-CM | POA: Diagnosis not present

## 2020-01-03 DIAGNOSIS — E785 Hyperlipidemia, unspecified: Secondary | ICD-10-CM | POA: Diagnosis not present

## 2020-01-03 DIAGNOSIS — G121 Other inherited spinal muscular atrophy: Secondary | ICD-10-CM

## 2020-01-03 DIAGNOSIS — E559 Vitamin D deficiency, unspecified: Secondary | ICD-10-CM

## 2020-01-03 DIAGNOSIS — Z1159 Encounter for screening for other viral diseases: Secondary | ICD-10-CM

## 2020-01-03 NOTE — Assessment & Plan Note (Signed)
Improved, score is 7 , recommend increased dose of buspar to twice daily and continue therapy

## 2020-01-03 NOTE — Assessment & Plan Note (Signed)
Improved markedly, continue therapy and current medication

## 2020-01-03 NOTE — Assessment & Plan Note (Signed)
Quadriparetic and wheelchair dependent. Current chair is over 28 years old  With multiple defects which make it unsafe and ineffective. Needs replacement, documentation with face to face exam at this visit

## 2020-01-03 NOTE — Assessment & Plan Note (Signed)
Quadriparetic due to muscular atrophy, current electric wheelchair is over 28 years old with multiple defects which increase her risk for decubittus ulcers and limit her saf and independent mobilty when in use. New wheelchair is overdue and is necessary

## 2020-01-03 NOTE — Progress Notes (Signed)
Virtual Visit via Telephone Note, visit is via video  I connected with Janet Acevedo on 01/03/20 at  1:00 PM EDT by video and verified that I am speaking with the correct person using two identifiers.  Location: Patient: home Provider: office   I discussed the limitations, risks, security and privacy concerns of performing an evaluation and management service by telephone and the availability of in person appointments. I also discussed with the patient that there may be a patient responsible charge related to this service. The patient expressed understanding and agreed to proceed.   History of Present Illness:   Pt is here fo face to face visit needing a new wheelchair. Curerent wheelchair is over 56 years old.  Has lifetinme need due to congenital neurologic disease, spinal muscular atrophy, which has resulted in quadriparesis Cushion is worn on seat and for back support, therefore increasing risk of decubitus ulcers, also unconmfortable when sitting has back and neck pain ,due to  inadequate padding Head rest is broken, so unable to recline comfortably to relieve pressure Control panel for different function on the wheelchair has  not worked  for the past 1 year Information systems manager which allows more independence , eg ability to brush teeth, eat at table, wash hands, work at desk, open doors  not working for 2 years so increased dependence and reduced quality of life Battery doe not hold a full charge , despite being charged overnight so not functioning and providing the service it should Tyres are extremely worn PE: Ht 5\' 7"  (1.702 m)   Wt 152 lb (68.9 kg)   BMI 23.81 kg/m  Alert and oriented in no cardiopulmonary distress  Assessment and Plan:  Type II spinal muscular atrophy ([place]) Quadriparetic and wheelchair dependent. Current chair is over 89 years old  With multiple defects which make it unsafe and ineffective. Needs replacement, documentation with face to face exam at this visit    GAD (generalized anxiety disorder) Improved, score is 7 , recommend increased dose of buspar to twice daily and continue therapy  Wheelchair dependent Quadriparetic due to muscular atrophy, current electric wheelchair is over 62 years old with multiple defects which increase her risk for decubittus ulcers and limit her saf and independent mobilty when in use. New wheelchair is overdue and is necessary  Depression, major, single episode, moderate ([place]) Improved markedly, continue therapy and current medication    Follow Up Instructions:    I discussed the assessment and treatment plan with the patient. The patient was provided an opportunity to ask questions and all were answered. The patient agreed with the plan and demonstrated an understanding of the instructions.   The patient was advised to call back or seek an in-person evaluation if the symptoms worsen or if the condition fails to improve as anticipated.  I provided 54minutes of face-to-face time during this encounter.   Tula Nakayama, MD

## 2020-01-03 NOTE — Patient Instructions (Addendum)
Wellness past due please schedule  F/u in office with MD early September, flu vaccine at visit, call if you need me sooner  Increase buspar to one tablet TWO times daily  Thankful you are able to get out , sit outside and feel better  Paperwork will be submitted for wheelchair this week   Please get fasting CBC, lipid, cmp and eGFr, hBA1C, tSH, hepatitis C screen and vit D as soon as possible  Think about what you will eat, plan ahead. Choose " clean, green, fresh or frozen" over canned, processed or packaged foods which are more sugary, salty and fatty. 70 to 75% of food eaten should be vegetables and fruit. Three meals at set times with snacks allowed between meals, but they must be fruit or vegetables. Aim to eat over a 12 hour period , example 7 am to 7 pm, and STOP after  your last meal of the day. Drink water,generally about 64 ounces per day, no other drink is as healthy. Fruit juice is best enjoyed in a healthy way, by EATING the fruit.   Thanks for choosing [facility], we consider it a privelige to serve you.

## 2020-01-04 ENCOUNTER — Other Ambulatory Visit: Payer: Self-pay

## 2020-01-04 ENCOUNTER — Ambulatory Visit (INDEPENDENT_AMBULATORY_CARE_PROVIDER_SITE_OTHER): Payer: Medicare Other | Admitting: Psychiatry

## 2020-01-04 DIAGNOSIS — F411 Generalized anxiety disorder: Secondary | ICD-10-CM | POA: Diagnosis not present

## 2020-01-04 NOTE — Progress Notes (Signed)
Virtual Visit via Telephone Note  I connected with Janet Acevedo on 01/04/20 at 1:15 PM EDT by telephone and verified that I am speaking with the correct person using two identifiers.   I discussed the limitations, risks, security and privacy concerns of performing an evaluation and management service by telephone and the availability of in person appointments. I also discussed with the patient that there may be a patient responsible charge related to this service. The patient expressed understanding and agreed to proceed.  .  I provided 45 minutes of non-face-to-face time during this encounter.   [name], LCSW            THERAPIST PROGRESS NOTE  Location:  Patient - home/ Provider - [place] office   Session Time: Thursday 01/04/2020 1:15 PM - 2:00 PM     Participation Level: Active  Behavioral Response: CasualAlertAnxious  Type of Therapy: Individual Therapy      Treatment Goals addressed:  learn and implement cognitive and behavioral strategies to cope with anxiety  Interventions: CBT and Supportive   Summary: Janet Acevedo is a 29 y.o. female who is referred for services by psychiatrist Dr. Modesta Messing due to patient experiencing symptoms of anxiety. She denies any psychiatric hospitalizations and no previous involvement in psychotherapy.  patient reports having uncontrollable anxiety and finding herself in an anxious state every day. She reports having anxiety since childhood but has worsened in the last 1-2 years. She reports stress regarding an aunt who is a quadriplegic as her health has fluctuated. Patient reports being very close with his aunt. She also reports dealing with her sexuality and says she has had a girlfriend secretly for the past 8 years. She says she used to be okay with this but now being tired of hiding it. She also worries about her brother who is in prison and openly gay. Patient's reported symptoms of anxiety include "sweaty palms, hands  tremble alot, voice is shakey, emotional outbursts of crying, increased heart rate".  Patient was last seen about 2 weeks ago by virtual visit.  She experiences minimal symptoms of anxiety and reports decreased worry. She is very pleased with her increased efforts to use assertiveness skills and cites examples regarding interaction with her mother and her brother.  She reports now feeling less anxious and feeling more comfortable regarding interaction with her mother as patient initiated conversation with her mother about sexuality.  She also is pleased she expressed her concerns to her brother about the previous conflict.  She reports salon appointment went will and using assertiveness skills to express her concerns to her stylist.patient is pleased with her progress but continues to have some difficulty implementing skills consistently and still reports a pattern of spiraling downward if someone becomes upset with her.     Suicidal/Homicidal: Nowithout intent/plan  Therapist Response:  Reviewed symptoms, praised and reinforced patient's increased efforts to use assertiveness skills, discussed effects, reviewed treatment plan, obtained patient's permission to initial plan for patient as this was a virtual visit, reviewed connection between thoughts/mood/behavior, discussed thoughts that inhibit/promote effective assertion , reviewed rationale for keeping thought log and develop plan with patient to use thought log between sessions, will send patient handouts (cognitive triangle, unhelpful thinking styles, thought log) in preparation for next session    Plan: Return again in 2 weeks.  Diagnosis: Axis I: Generalized Anxiety Disorder    Axis II: Deferred    [name], LCSW 01/04/2020

## 2020-01-16 ENCOUNTER — Ambulatory Visit (HOSPITAL_COMMUNITY): Payer: Medicare Other | Admitting: Psychiatry

## 2020-01-16 ENCOUNTER — Other Ambulatory Visit: Payer: Self-pay

## 2020-01-22 ENCOUNTER — Other Ambulatory Visit: Payer: Self-pay

## 2020-01-22 ENCOUNTER — Telehealth (INDEPENDENT_AMBULATORY_CARE_PROVIDER_SITE_OTHER): Payer: Medicare Other

## 2020-01-22 VITALS — BP 108/78 | Ht 67.0 in | Wt 152.0 lb

## 2020-01-22 DIAGNOSIS — Z0000 Encounter for general adult medical examination without abnormal findings: Secondary | ICD-10-CM | POA: Diagnosis not present

## 2020-01-22 DIAGNOSIS — Z1159 Encounter for screening for other viral diseases: Secondary | ICD-10-CM | POA: Diagnosis not present

## 2020-01-22 NOTE — Patient Instructions (Signed)
Janet Acevedo , Thank you for taking time to come for your Medicare Wellness Visit. I appreciate your ongoing commitment to your health goals. Please review the following plan we discussed and let me know if I can assist you in the future.   Screening recommendations/referrals: Colonoscopy: N/A Mammogram: N/A Bone Density: N/A Recommended yearly ophthalmology/optometry visit for glaucoma screening and checkup Recommended yearly dental visit for hygiene and checkup  Vaccinations: Influenza vaccine: UP TO DATE Pneumococcal vaccine: UP TO DATE Tdap vaccine: UP TO DATE  Shingles vaccine: N/A       Next appointment: Wellness in 1 year  Preventive Care 40-64 Years, Female Preventive care refers to lifestyle choices and visits with your health care provider that can promote health and wellness. What does preventive care include?  A yearly physical exam. This is also called an annual well check.  Dental exams once or twice a year.  Routine eye exams. Ask your health care provider how often you should have your eyes checked.  Personal lifestyle choices, including:  Daily care of your teeth and gums.  Regular physical activity.  Eating a healthy diet.  Avoiding tobacco and drug use.  Limiting alcohol use.  Practicing safe sex.  Taking low-dose aspirin daily starting at age 63.  Taking vitamin and mineral supplements as recommended by your health care provider. What happens during an annual well check? The services and screenings done by your health care provider during your annual well check will depend on your age, overall health, lifestyle risk factors, and family history of disease. Counseling  Your health care provider may ask you questions about your:  Alcohol use.  Tobacco use.  Drug use.  Emotional well-being.  Home and relationship well-being.  Sexual activity.  Eating habits.  Work and work Statistician.  Method of birth control.  Menstrual  cycle.  Pregnancy history. Screening  You may have the following tests or measurements:  Height, weight, and BMI.  Blood pressure.  Lipid and cholesterol levels. These may be checked every 5 years, or more frequently if you are over 64 years old.  Skin check.  Lung cancer screening. You may have this screening every year starting at age 69 if you have a 30-pack-year history of smoking and currently smoke or have quit within the past 15 years.  Fecal occult blood test (FOBT) of the stool. You may have this test every year starting at age 11.  Flexible sigmoidoscopy or colonoscopy. You may have a sigmoidoscopy every 5 years or a colonoscopy every 10 years starting at age 69.  Hepatitis C blood test.  Hepatitis B blood test.  Sexually transmitted disease (STD) testing.  Diabetes screening. This is done by checking your blood sugar (glucose) after you have not eaten for a while (fasting). You may have this done every 1-3 years.  Mammogram. This may be done every 1-2 years. Talk to your health care provider about when you should start having regular mammograms. This may depend on whether you have a family history of breast cancer.  BRCA-related cancer screening. This may be done if you have a family history of breast, ovarian, tubal, or peritoneal cancers.  Pelvic exam and Pap test. This may be done every 3 years starting at age 24. Starting at age 34, this may be done every 5 years if you have a Pap test in combination with an HPV test.  Bone density scan. This is done to screen for osteoporosis. You may have this scan if you are  at high risk for osteoporosis. Discuss your test results, treatment options, and if necessary, the need for more tests with your health care provider. Vaccines  Your health care provider may recommend certain vaccines, such as:  Influenza vaccine. This is recommended every year.  Tetanus, diphtheria, and acellular pertussis (Tdap, Td) vaccine. You may  need a Td booster every 10 years.  Zoster vaccine. You may need this after age 31.  Pneumococcal 13-valent conjugate (PCV13) vaccine. You may need this if you have certain conditions and were not previously vaccinated.  Pneumococcal polysaccharide (PPSV23) vaccine. You may need one or two doses if you smoke cigarettes or if you have certain conditions. Talk to your health care provider about which screenings and vaccines you need and how often you need them. This information is not intended to replace advice given to you by your health care provider. Make sure you discuss any questions you have with your health care provider. Document Released: 08/09/2015 Document Revised: 04/01/2016 Document Reviewed: 05/14/2015 Elsevier Interactive Patient Education  2017 [place] Prevention in the Home Falls can cause injuries. They can happen to people of all ages. There are many things you can do to make your home safe and to help prevent falls. What can I do on the outside of my home?  Regularly fix the edges of walkways and driveways and fix any cracks.  Remove anything that might make you trip as you walk through a door, such as a raised step or threshold.  Trim any bushes or trees on the path to your home.  Use bright outdoor lighting.  Clear any walking paths of anything that might make someone trip, such as rocks or tools.  Regularly check to see if handrails are loose or broken. Make sure that both sides of any steps have handrails.  Any raised decks and porches should have guardrails on the edges.  Have any leaves, snow, or ice cleared regularly.  Use sand or salt on walking paths during winter.  Clean up any spills in your garage right away. This includes oil or grease spills. What can I do in the bathroom?  Use night lights.  Install grab bars by the toilet and in the tub and shower. Do not use towel bars as grab bars.  Use non-skid mats or decals in the tub or  shower.  If you need to sit down in the shower, use a plastic, non-slip stool.  Keep the floor dry. Clean up any water that spills on the floor as soon as it happens.  Remove soap buildup in the tub or shower regularly.  Attach bath mats securely with double-sided non-slip rug tape.  Do not have throw rugs and other things on the floor that can make you trip. What can I do in the bedroom?  Use night lights.  Make sure that you have a light by your bed that is easy to reach.  Do not use any sheets or blankets that are too big for your bed. They should not hang down onto the floor.  Have a firm chair that has side arms. You can use this for support while you get dressed.  Do not have throw rugs and other things on the floor that can make you trip. What can I do in the kitchen?  Clean up any spills right away.  Avoid walking on wet floors.  Keep items that you use a lot in easy-to-reach places.  If you need to  reach something above you, use a strong step stool that has a grab bar.  Keep electrical cords out of the way.  Do not use floor polish or wax that makes floors slippery. If you must use wax, use non-skid floor wax.  Do not have throw rugs and other things on the floor that can make you trip. What can I do with my stairs?  Do not leave any items on the stairs.  Make sure that there are handrails on both sides of the stairs and use them. Fix handrails that are broken or loose. Make sure that handrails are as long as the stairways.  Check any carpeting to make sure that it is firmly attached to the stairs. Fix any carpet that is loose or worn.  Avoid having throw rugs at the top or bottom of the stairs. If you do have throw rugs, attach them to the floor with carpet tape.  Make sure that you have a light switch at the top of the stairs and the bottom of the stairs. If you do not have them, ask someone to add them for you. What else can I do to help prevent  falls?  Wear shoes that:  Do not have high heels.  Have rubber bottoms.  Are comfortable and fit you well.  Are closed at the toe. Do not wear sandals.  If you use a stepladder:  Make sure that it is fully opened. Do not climb a closed stepladder.  Make sure that both sides of the stepladder are locked into place.  Ask someone to hold it for you, if possible.  Clearly mark and make sure that you can see:  Any grab bars or handrails.  First and last steps.  Where the edge of each step is.  Use tools that help you move around (mobility aids) if they are needed. These include:  Canes.  Walkers.  Scooters.  Crutches.  Turn on the lights when you go into a dark area. Replace any light bulbs as soon as they burn out.  Set up your furniture so you have a clear path. Avoid moving your furniture around.  If any of your floors are uneven, fix them.  If there are any pets around you, be aware of where they are.  Review your medicines with your doctor. Some medicines can make you feel dizzy. This can increase your chance of falling. Ask your doctor what other things that you can do to help prevent falls. This information is not intended to replace advice given to you by your health care provider. Make sure you discuss any questions you have with your health care provider. Document Released: 05/09/2009 Document Revised: 12/19/2015 Document Reviewed: 08/17/2014 Elsevier Interactive Patient Education  2017 [employer].

## 2020-01-22 NOTE — Progress Notes (Signed)
Subjective:   Janet Acevedo is a 29 y.o. female who presents for Medicare Annual (Subsequent) preventive examination.  Review of Systems     Cardiac Risk Factors include: dyslipidemia;sedentary lifestyle     Objective:    Today's Vitals   01/22/20 1312  BP: 108/78  Weight: 152 lb (68.9 kg)  Height: '5\' 7"'$  (1.702 m)  PainSc: 0-No pain   Body mass index is 23.81 kg/m.  Advanced Directives 01/12/2017  Does Patient Have a Medical Advance Directive? No    Current Medications (verified) Outpatient Encounter Medications as of 01/22/2020  Medication Sig  . albuterol (ACCUNEB) 1.25 MG/3ML nebulizer solution TAKE 3 MLS (1.25 MG TOTAL) BY NEBULIZATION EVERY 4 (FOUR) HOURS AS NEEDED. DX J45.40  . albuterol (VENTOLIN HFA) 108 (90 Base) MCG/ACT inhaler INHALE 2 PUFFS INTO THE LUNGS EVERY 4 (FOUR) HOURS AS NEEDED FOR WHEEZING OR SHORTNESS OF BREATH.  . betamethasone dipropionate (DIPROLENE) 0.05 % cream APPLY TOPICALLY 2 (TWO) TIMES DAILY.  [name] Biotin (BIOTIN MAXIMUM STRENGTH) 10 MG TABS Take 1 tablet by mouth daily.  . busPIRone (BUSPAR) 5 MG tablet TAKE 1 TABLET BY MOUTH TWICE A DAY FOR ANXIETY  . cromolyn (OPTICROM) 4 % ophthalmic solution PLACE 1 DROP INTO BOTH EYES FOUR TIMES DAILY  . Fluocinolone Acetonide 0.01 % OIL Apply sparingly to scalp two times  Weekly, as needed, for itch and flaking  . Fluocinolone Acetonide Scalp 0.01 % OIL Apply 1 application topically as directed.  . fluticasone (FLONASE) 50 MCG/ACT nasal spray Place 2 sprays into both nostrils daily.  [name] ketoconazole (NIZORAL) 2 % cream APPLY 1 APPLICATION TOPICALLY AS NEEDED FOR DERMATITIS  . ketoconazole (NIZORAL) 2 % shampoo APPLY TO SCALP FOR 5 MINUTES AND RINSE OUT  . levocetirizine (XYZAL) 5 MG tablet TAKE 1 TABLET BY MOUTH EVERY DAY IN THE EVENING  . montelukast (SINGULAIR) 10 MG tablet TAKE 1 TABLET BY MOUTH EVERY DAY  . nystatin (MYCOSTATIN/NYSTOP) powder APPLY TO AFFECTED AREA TWICE A DAY AS NEEDED  . omeprazole  (PRILOSEC) 20 MG capsule THE PATIENT HAS TROUBLE SWALLOWING. PLEASE TAKE 2 CAPSULES TWICE A DAY BY MOUTH  . ondansetron (ZOFRAN) 4 MG tablet Take one tablet by mouth once daily , as needed, for nausea  . Respiratory Therapy Supplies (NEBULIZER/ADULT MASK) KIT Insurance preference  . Respiratory Therapy Supplies (NEBULIZER/TUBING/MOUTHPIECE) KIT Use as directed. Insurance preference. Nebulizer kit and mask  . sertraline (ZOLOFT) 25 MG tablet TAKE 1 TABLET BY MOUTH EVERY DAY  . Spacer/Aero-Holding Chambers (AEROCHAMBER PLUS) inhaler Use as instructed  . SYMBICORT 160-4.5 MCG/ACT inhaler TAKE 2 PUFFS BY MOUTH TWICE A DAY  . UNABLE TO FIND Gloves- 1 box per month as needed  . UNABLE TO FIND Under pads- for use daily as needed  . UNABLE TO FIND Incontinence liners- use as needed for incontinence Gloves- 1 box as needed for incontinence DX urinary incontinence  . [DISCONTINUED] Calcium Carbonate (CALCIUM 500 PO) Take 1 tablet by mouth daily.    No facility-administered encounter medications on file as of 01/22/2020.    Allergies (verified) Other and Ciprofloxacin   History: Past Medical History:  Diagnosis Date  . Allergy   . Anxiety   . Asthma   . Depression, major, single episode, moderate ([place]) 09/05/2017  . GERD (gastroesophageal reflux disease)   . Neuromuscular disorder ([place])   . Osteomyelitis [facility]) June 2011  . Otitis externa, eczematoid 05/28/2019  . Perennial allergic rhinitis   . Seborrheic dermatitis of scalp 2006  . Spinal  muscle atrophy ([place])    type 2/3 18 months   Past Surgical History:  Procedure Laterality Date  . bilateral hip reinsertion  under age 40   hips out of socket, pt was only able to cruise hold on and move short distances   . BONE BIOPSY     spinal bone   . MIRENA PLACED 10/27/10    . SPINAL FUSION  1999   rods placed in thoracolumbar spine to excessive scoliosis primarily    Family History  Problem Relation Age of Onset  . Hyperlipidemia Mother   .  Hypertension Mother   . Hypertension Brother   . Diabetes Brother        borderline   . Anxiety disorder Brother   . Depression Brother   . Drug abuse Brother    Social History   Socioeconomic History  . Marital status: Single    Spouse name: Not on file  . Number of children: Not on file  . Years of education: Not on file  . Highest education level: Associate degree: academic program  Occupational History  . Occupation: Ship broker  Tobacco Use  . Smoking status: Never Smoker  . Smokeless tobacco: Never Used  Vaping Use  . Vaping Use: Never used  Substance and Sexual Activity  . Alcohol use: No  . Drug use: No  . Sexual activity: Never  Other Topics Concern  . 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 Difficulty of Paying Living Expenses: Not hard at all  Food Insecurity: No Food Insecurity  . Worried About Charity fundraiser in the Last Year: Never true  . Ran Out of Food in the Last Year: Never true  Transportation Needs: No Transportation Needs  . Lack of Transportation (Medical): No  . Lack of Transportation (Non-Medical): No  Physical Activity: Inactive  . Days of Exercise per Week: 0 days  . Minutes of Exercise per Session: 0 min  Stress: No Stress Concern Present  . Feeling of Stress : Not at all  Social Connections: Moderately Isolated  . Frequency of Communication with Friends and Family: More than three times a week  . Frequency of Social Gatherings with Friends and Family: More than three times a week  . Attends Religious Services: Never  . Active Member of Clubs or Organizations: Yes  . Attends Archivist Meetings: Never  . Marital Status: Never married    Tobacco Counseling Counseling given: Not Answered   Clinical Intake:  Pre-visit preparation completed: Yes  Pain : No/denies pain Pain Score: 0-No  pain     Nutritional Status: BMI of 19-24  Normal Diabetes: No  How often do you need to have someone help you when you read instructions, pamphlets, or other written materials from your doctor or pharmacy?: 1 - Never What is the last grade level you completed in school?: college  Diabetic? no  Interpreter Needed?: No  Information entered by :: Temesha Queener LPN   Activities of Daily Living In your present state of health, do you have any difficulty performing the following activities: 01/22/2020  Hearing? N  Vision? N  Difficulty concentrating or making decisions? N  Walking or climbing stairs? Y  Dressing or bathing? Y  Doing errands, shopping? Y  Preparing Food and eating ? Y  Using the Toilet? Darreld Mclean  In the past six months, have you accidently leaked urine? N  Do you have problems with loss of bowel control? N  Managing your Medications? Y  Managing your Finances? Y  Housekeeping or managing your Housekeeping? Y  Some recent data might be hidden    Patient Care Team: Fayrene Helper, MD as PCP - General (Family Medicine) Aretha Parrot Donnella Sham, MD as Referring Physician (Pulmonary Disease)  Indicate any recent Medical Services you may have received from other than [facility] providers in the past year (date may be approximate).     Assessment:   This is a routine wellness examination for [place].  Hearing/Vision screen No exam data present  Dietary issues and exercise activities discussed: Current Exercise Habits: Home exercise routine, Exercise limited by: neurologic condition(s);Other - see comments (wheelchair confined)  Goals    . DIET - EAT MORE FRUITS AND VEGETABLES    . DIET - INCREASE WATER INTAKE      Depression Screen PHQ 2/9 Scores 01/22/2020 01/03/2020 09/27/2019 05/23/2019 05/23/2019 12/22/2018 10/06/2018  PHQ - 2 Score 0 0 4 2 0 0 0  PHQ- 9 Score 0 - 13 10 - - -    Fall Risk Fall Risk  01/22/2020 01/03/2020 05/23/2019 12/22/2018 10/06/2018  Falls in the past  year? Exclusion - non ambulatory 1 Exclusion - non ambulatory 1 0  Number falls in past yr: - 1 - 1 -  Injury with Fall? - 0 - 0 0    Any stairs in or around the home? No  If so, are there any without handrails? No  Home free of loose throw rugs in walkways, pet beds, electrical cords, etc? yes Adequate lighting in your home to reduce risk of falls? Yes   ASSISTIVE DEVICES UTILIZED TO PREVENT FALLS:  Life alert? No  Use of a cane, walker or w/c? Yes wheelchair Grab bars in the bathroom? No  Shower chair or bench in shower? Yes  Elevated toilet seat or a handicapped toilet? No   TIMED UP AND GO:  Was the test performed? No .  Length of time to ambulate 10 feet:  unable due to virtual visit     Cognitive Function:     6CIT Screen 01/22/2020 12/22/2018 07/12/2017  What Year? 0 points 0 points 0 points  What month? 0 points 0 points 0 points  What time? 0 points 0 points 0 points  Count back from 20 0 points 0 points 0 points  Months in reverse 0 points 0 points 0 points  Repeat phrase 0 points 0 points 0 points  Total Score 0 0 0    Immunizations Immunization History  Administered Date(s) Administered  . Influenza Split 04/07/2012  . Influenza Whole 04/07/2010, 04/03/2011  . Influenza,inj,Quad PF,6+ Mos 05/10/2013, 07/12/2014, 04/09/2015, 05/05/2016, 05/03/2017, 05/10/2018, 06/20/2019  . Moderna SARS-COVID-2 Vaccination 10/25/2019, 11/20/2019  . Pneumococcal Conjugate-13 01/20/2017    TDAP status: Up to date Flu Vaccine status: Up to date Pneumococcal vaccine status: Up to date Covid-19 vaccine status: Completed vaccines  Qualifies for Shingles Vaccine? No  does not qualify Zostavax completed No  does not qualify Shingrix Completed?: No.    Education has been provided regarding the importance of this vaccine. Patient has been advised to call insurance company to determine out of pocket expense if they have not yet received this vaccine. Advised may also receive  vaccine at local pharmacy or Health Dept. Verbalized acceptance and understanding.  Screening Tests Health Maintenance  Topic Date Due  .  Hepatitis C Screening  Never done  . PAP-Cervical Cytology Screening  Never done  . PAP SMEAR-Modifier  Never done  . INFLUENZA VACCINE  02/25/2020  . TETANUS/TDAP  07/27/2025  . COVID-19 Vaccine  Completed  . HIV Screening  Completed    Health Maintenance  Health Maintenance Due  Topic Date Due  . Hepatitis C Screening  Never done  . PAP-Cervical Cytology Screening  Never done  . PAP SMEAR-Modifier  Never done    Colorectal cancer screening: No longer required. does not qualify Mammogram status: No longer required. does not qualify Bone Density status: Ordered no. Pt provided with contact info and advised to call to schedule appt. does not qualify  Lung Cancer Screening: (Low Dose CT Chest recommended if Age 52-80 years, 30 pack-year currently smoking OR have quit w/in 15years.) does not qualify.   Lung Cancer Screening Referral: no  Additional Screening:  Hepatitis C Screening: does qualify; ordered  Vision Screening: Recommended annual ophthalmology exams for early detection of glaucoma and other disorders of the eye. Is the patient up to date with their annual eye exam?  Yes  Who is the provider or what is the name of the office in which the patient attends annual eye exams? Unsure last was [facility] If pt is not established with a provider, would they like to be referred to a provider to establish care? No .   Dental Screening: Recommended annual dental exams for proper oral hygiene  Community Resource Referral / Chronic Care Management: CRR required this visit?  No   CCM required this visit?  No      Plan:     I have personally reviewed and noted the following in the patient's chart:   . Medical and social history . Use of alcohol, tobacco or illicit drugs  . Current medications and supplements . Functional ability  and status . Nutritional status . Physical activity . Advanced directives . List of other physicians . Hospitalizations, surgeries, and ER visits in previous 12 months . Vitals . Screenings to include cognitive, depression, and falls . Referrals and appointments  In addition, I have reviewed and discussed with patient certain preventive protocols, quality metrics, and best practice recommendations. A written personalized care plan for preventive services as well as general preventive health recommendations were provided to patient.     Kate Sable, LPN, LPN   [id number]   Nurse Notes:

## 2020-01-30 ENCOUNTER — Ambulatory Visit (INDEPENDENT_AMBULATORY_CARE_PROVIDER_SITE_OTHER): Payer: Medicare Other | Admitting: Psychiatry

## 2020-01-30 ENCOUNTER — Other Ambulatory Visit: Payer: Self-pay

## 2020-01-30 DIAGNOSIS — F411 Generalized anxiety disorder: Secondary | ICD-10-CM | POA: Diagnosis not present

## 2020-01-30 NOTE — Progress Notes (Addendum)
Virtual Visit via Video Note  I connected with [name] on 01/30/20 at 1:10 PM EDT by a video enabled telemedicine application and verified that I am speaking with the correct person using two identifiers.   I discussed the limitations of evaluation and management by telemedicine and the availability of in person appointments. The patient expressed understanding and agreed to proceed.   I provided 42 minutes of non-face-to-face time during this encounter.   [name], LCSW               THERAPIST PROGRESS NOTE  Location:  Patient - home/ Provider - [place] office   Session Time: Tuesday 01/30/2020 1:10 PM - 1:52 PM     Participation Level: Active  Behavioral Response: CasualAlertAnxious  Type of Therapy: Individual Therapy      Treatment Goals addressed:  learn and implement cognitive and behavioral strategies to cope with anxiety  Interventions: CBT and Supportive   Summary: [name] is a 29 y.o. female who is referred for services by psychiatrist Dr. Modesta Messing due to patient experiencing symptoms of anxiety. She denies any psychiatric hospitalizations and no previous involvement in psychotherapy.  patient reports having uncontrollable anxiety and finding herself in an anxious state every day. She reports having anxiety since childhood but has worsened in the last 1-2 years. She reports stress regarding an aunt who is a quadriplegic as her health has fluctuated. Patient reports being very close with his aunt. She also reports dealing with her sexuality and says she has had a girlfriend secretly for the past 8 years. She says she used to be okay with this but now being tired of hiding it. She also worries about her brother who is in prison and openly gay. Patient's reported symptoms of anxiety include "sweaty palms, hands tremble alot, voice is shakey, emotional outbursts of crying, increased heart rate".  Patient was last seen about 2 weeks ago by virtual  visit.  She experiences moderate symptoms of anxiety including excessive worry, poor appetite, nervousness. She reports increased stress triggered by increased tension in the relationship with her partner.  Per her report, partner has a pattern of excluding her when partners family decides to become involved in partners live.  Patient says partner's family has had sporadic involvement during her 10-year relationship with partner.  She says she has tried to express her concerns and her feelings but partner tends to be noncommunicative.  She reports becoming so consumed and worried that she does not eat at times and she stays alone.   Suicidal/Homicidal: Nowithout intent/plan  Therapist Response:  Reviewed symptoms, discussed stressors, facilitated expression of thoughts and feelings, validated feelings, assisted patient examine her pattern of interaction with partner, assisted patient identify ways to improve communication with the use of iMessages, will send patient handout about this, also began to explore ways for patient to become more involved in pursuing individual interests and  activities to reduce dependency on partner, developed plan with patient to list possible activities and interests in preparation of next session.   Plan: Return again in 2 weeks.  Diagnosis: Axis I: Generalized Anxiety Disorder    Axis II: Deferred    [name], LCSW 01/30/2020

## 2020-02-05 ENCOUNTER — Telehealth: Payer: Self-pay | Admitting: Family Medicine

## 2020-02-05 NOTE — Telephone Encounter (Signed)
There is paperwork In your box regarding patient's need for oxygen.  If there is no medical documentation in her chart to establish need for oxygen please check with her and see what she uses this for, when and since when she started using oxygen.  We will likely need also to set up video visit to justify her use of oxygen if there is nothing in established in the past.    Please get back to me on this,  thank you

## 2020-02-05 NOTE — Telephone Encounter (Signed)
She was here 01-03-20 this note would just need to be addended to state the need for pt nebulizer if  you would like for her to continue this

## 2020-02-08 ENCOUNTER — Telehealth: Payer: Self-pay

## 2020-02-08 NOTE — Telephone Encounter (Signed)
Fax From [facility] for Pt,  For [facility] pt to set up appt, as Dr Moshe Cipro wants a Face to Face  Information in [facility]

## 2020-02-08 NOTE — Telephone Encounter (Signed)
Msg in your box, pt needs in office visit for this to be completed

## 2020-02-21 ENCOUNTER — Other Ambulatory Visit: Payer: Self-pay | Admitting: Family Medicine

## 2020-02-23 ENCOUNTER — Telehealth: Payer: Medicare Other | Admitting: Nurse Practitioner

## 2020-02-23 ENCOUNTER — Other Ambulatory Visit: Payer: Self-pay

## 2020-02-23 ENCOUNTER — Encounter: Payer: Self-pay | Admitting: Family Medicine

## 2020-02-23 ENCOUNTER — Telehealth (INDEPENDENT_AMBULATORY_CARE_PROVIDER_SITE_OTHER): Payer: Medicare Other | Admitting: Family Medicine

## 2020-02-23 VITALS — BP 108/78 | Ht 67.0 in | Wt 152.0 lb

## 2020-02-23 DIAGNOSIS — R221 Localized swelling, mass and lump, neck: Secondary | ICD-10-CM | POA: Diagnosis not present

## 2020-02-23 DIAGNOSIS — K146 Glossodynia: Secondary | ICD-10-CM | POA: Diagnosis not present

## 2020-02-23 DIAGNOSIS — J029 Acute pharyngitis, unspecified: Secondary | ICD-10-CM

## 2020-02-23 NOTE — Progress Notes (Signed)

## 2020-02-23 NOTE — Progress Notes (Signed)
Based on what you shared with me it looks like you have an issue,that should be evaluated in a face to face office visit. According to your chart you had a video visit earlier about this as well as an evisit. This is something that needs to be evaluated in a face to face visit to guarantee proper treatment.    NOTE: If you entered your credit card information for this eVisit, you will not be charged. You may see a "hold" on your card for the $35 but that hold will drop off and you will not have a charge processed.  If you are having a true medical emergency please call 911.     For an urgent face to face visit, [facility] has four urgent care centers for your convenience:   . [facility]    [phone]                  Get Driving Directions  [address], [place] [address] . 10 am to 8 pm Monday-Friday . 12 pm to 8 pm Saturday-Sunday   . [facility] at [place]                  Get Driving Directions  [address], [place], [place] [address] . 8 am to 8 pm Monday-Friday . 9 am to 6 pm Saturday . 11 am to 6 pm Sunday   . [facility] at [place]                  Get Driving Directions   [address], [place] [address] . 8 am to 8 pm Monday-Friday . 8 am to 4 pm Saturday-Sunday    . [facility] at [place]                    Get Driving Directions  [phone]  [address] [place], [place] [address]  . Monday-Friday, 12 PM to 6 PM    Your e-visit answers were reviewed by a board certified advanced clinical practitioner to complete your personal care plan.  Thank you for using e-Visits.

## 2020-02-23 NOTE — Assessment & Plan Note (Signed)
There are no changes to the tongue or inner mouth.  Per individuals who have looked around.  However she reports when she moves her tongue around she has pain with it.  She reports eating fresh watermelon and some onions.  Questionable oral pharyngeal allergy has not tried any Benadryl at this time.  Does not appear to have affected her asthma is speaking clearly without shortness of breath.  Is willing to get an appointment with the dentist to make sure there is nothing that is in this at that time.  I have encouraged her to reach back out if she needs anything go to the nearest emergency room if she is to develop further or additional swelling of the area as a phone visit is very difficult to assess the area that is being discussed.

## 2020-02-23 NOTE — Assessment & Plan Note (Signed)
She reports some swelling up underneath the mandible into the right side of her neck.  However she has no symptoms of illness.  The only problem she has is when she moves her tongue chewing or eating.  There are no changes to the tongue or inner mouth.  Per individuals who have looked around.  However she reports when she moves her tongue around she has pain with it.  She reports eating fresh watermelon and some onions.  Questionable oral pharyngeal allergy has not tried any Benadryl at this time.  Does not appear to have affected her asthma is speaking clearly without shortness of breath.  Is willing to get an appointment with the dentist to make sure there is nothing that is in this at that time.  I have encouraged her to reach back out if she needs anything go to the nearest emergency room if she is to develop further or additional swelling of the area as a phone visit is very difficult to assess the area that is being discussed.

## 2020-02-23 NOTE — Progress Notes (Signed)
Virtual Visit via Telephone Note   This visit type was conducted due to national recommendations for restrictions regarding the COVID-19 Pandemic (e.g. social distancing) in an effort to limit this patient's exposure and mitigate transmission in our community.  Due to her co-morbid illnesses, this patient is at least at moderate risk for complications without adequate follow up.  This format is felt to be most appropriate for this patient at this time.  The patient did not have access to video technology/had technical difficulties with video requiring transitioning to audio format only (telephone).  All issues noted in this document were discussed and addressed.  No physical exam could be performed with this format.    Evaluation Performed:  Follow-up visit  Date:  02/23/2020   ID:  Janet Acevedo, DOB 08/16/1990, MRN [id number]  Patient Location: Home Provider Location: Office/Clinic  Location of Patient: Home Location of Provider: Telehealth Consent was obtain for visit to be over via telehealth. I verified that I am speaking with the correct person using two identifiers.  PCP:  Fayrene Helper, MD   Chief Complaint:   Neck swelling/ tongue pain  History of Present Illness:    Janet Acevedo is a 29 y.o. female with few days of increased tightness and swelling underneath her neck jawline and the sides of her right side of her neck.  She denies having any illness like symptoms including but not limited to fever, sore throat, cough, itchy draining nose, sneezing, stuffy nose, itchy watery eyes, congestion and muscle aches.  Denies having any headaches.  She reports she has not seen a dentist in some time since Covid.  She is not had any excessive bleeding or sores that she notes.  No one is seeing anything in her mouth.  She does report that her only discomfort is when she is actually chewing and using her tongue or moving her tongue around.  She is not noted to have any lumps or  bumps in her mouth.  However she is unsure exactly where to say the swelling is versus lymph node versus tissue.  She is willing to go see a dentist to make sure that there is nothing going on there.  And reports that she has had some fresh watermelon and some onions.  But other than that nothing else possible questionable oropharyngeal allergy has not tried any Benadryl at this time.  She denies having any trouble breathing or asthma flare.  The patient does not have symptoms concerning for COVID-19 infection (fever, chills, cough, or new shortness of breath).   Past Medical, Surgical, Social History, Allergies, and Medications have been Reviewed.  Past Medical History:  Diagnosis Date  . Allergy   . Anxiety   . Asthma   . Depression, major, single episode, moderate ([place]) 09/05/2017  . GERD (gastroesophageal reflux disease)   . Neuromuscular disorder ([place])   . Osteomyelitis [facility]) June 2011  . Otitis externa, eczematoid 05/28/2019  . Perennial allergic rhinitis   . Seborrheic dermatitis of scalp 2006  . Spinal muscle atrophy ([place])    type 2/3 18 months   Past Surgical History:  Procedure Laterality Date  . bilateral hip reinsertion  under age 61   hips out of socket, pt was only able to cruise hold on and move short distances   . BONE BIOPSY     spinal bone   . MIRENA PLACED 10/27/10    . SPINAL FUSION  1999   rods placed in  thoracolumbar spine to excessive scoliosis primarily      Current Meds  Medication Sig  . albuterol (ACCUNEB) 1.25 MG/3ML nebulizer solution TAKE 3 MLS (1.25 MG TOTAL) BY NEBULIZATION EVERY 4 (FOUR) HOURS AS NEEDED. DX J45.40  . albuterol (VENTOLIN HFA) 108 (90 Base) MCG/ACT inhaler INHALE 2 PUFFS INTO THE LUNGS EVERY 4 (FOUR) HOURS AS NEEDED FOR WHEEZING OR SHORTNESS OF BREATH.  . betamethasone dipropionate (DIPROLENE) 0.05 % cream APPLY TOPICALLY 2 (TWO) TIMES DAILY.  [name] Biotin (BIOTIN MAXIMUM STRENGTH) 10 MG TABS Take 1 tablet by mouth daily.  . busPIRone  (BUSPAR) 5 MG tablet TAKE 1 TABLET BY MOUTH TWICE A DAY FOR ANXIETY  . cromolyn (OPTICROM) 4 % ophthalmic solution PLACE 1 DROP INTO BOTH EYES FOUR TIMES DAILY  . Fluocinolone Acetonide 0.01 % OIL Apply sparingly to scalp two times  Weekly, as needed, for itch and flaking  . Fluocinolone Acetonide Scalp 0.01 % OIL Apply 1 application topically as directed.  . fluticasone (FLONASE) 50 MCG/ACT nasal spray Place 2 sprays into both nostrils daily.  [name] ketoconazole (NIZORAL) 2 % cream APPLY 1 APPLICATION TOPICALLY AS NEEDED FOR DERMATITIS  . ketoconazole (NIZORAL) 2 % shampoo APPLY TO SCALP FOR 5 MINUTES AND RINSE OUT  . levocetirizine (XYZAL) 5 MG tablet TAKE 1 TABLET BY MOUTH EVERY DAY IN THE EVENING  . montelukast (SINGULAIR) 10 MG tablet TAKE 1 TABLET BY MOUTH EVERY DAY  . nystatin (MYCOSTATIN/NYSTOP) powder APPLY TO AFFECTED AREA TWICE A DAY AS NEEDED  . omeprazole (PRILOSEC) 20 MG capsule THE PATIENT HAS TROUBLE SWALLOWING. PLEASE TAKE 2 CAPSULES TWICE A DAY BY MOUTH  . ondansetron (ZOFRAN) 4 MG tablet Take one tablet by mouth once daily , as needed, for nausea  . Respiratory Therapy Supplies (NEBULIZER/ADULT MASK) KIT Insurance preference  . Respiratory Therapy Supplies (NEBULIZER/TUBING/MOUTHPIECE) KIT Use as directed. Insurance preference. Nebulizer kit and mask  . sertraline (ZOLOFT) 25 MG tablet TAKE 1 TABLET BY MOUTH EVERY DAY  . Spacer/Aero-Holding Chambers (AEROCHAMBER PLUS) inhaler Use as instructed  . SYMBICORT 160-4.5 MCG/ACT inhaler TAKE 2 PUFFS BY MOUTH TWICE A DAY  . UNABLE TO FIND Gloves- 1 box per month as needed  . UNABLE TO FIND Under pads- for use daily as needed  . UNABLE TO FIND Incontinence liners- use as needed for incontinence Gloves- 1 box as needed for incontinence DX urinary incontinence     Allergies:   Other and Ciprofloxacin   ROS:   Please see the history of present illness.    All other systems reviewed and are negative.   Labs/Other Tests and Data  Reviewed:    Recent Labs: No results found for requested labs within last 8760 hours.   Recent Lipid Panel Lab Results  Component Value Date/Time   CHOL 197 10/06/2018 01:53 PM   TRIG 53 10/06/2018 01:53 PM   HDL 43 (L) 10/06/2018 01:53 PM   CHOLHDL 4.6 10/06/2018 01:53 PM   LDLCALC 140 (H) 10/06/2018 01:53 PM    Wt Readings from Last 3 Encounters:  02/23/20 152 lb (68.9 kg)  01/22/20 152 lb (68.9 kg)  01/03/20 152 lb (68.9 kg)     Objective:    Vital Signs:  BP 108/78   Ht _0  (1.702 m)   Wt 152 lb (68.9 kg)   BMI 23.81 kg/m    VITAL SIGNS:  reviewed GEN:  alert and oriented RESPIRATORY:  no shortness of breath, difficulty making sentences noted in conversation PSYCH:  normal affect and mood  ASSESSMENT & PLAN:    1. Tongue pain  2. Neck swelling   Time:   Today, I have spent 10 minutes with the patient with telehealth technology discussing the above problems.     Medication Adjustments/Labs and Tests Ordered: Current medicines are reviewed at length with the patient today.  Concerns regarding medicines are outlined above.   Tests Ordered: No orders of the defined types were placed in this encounter.   Medication Changes: No orders of the defined types were placed in this encounter.   Disposition:  Follow up 06/12/2020 Signed, Perlie Mayo, NP  02/23/2020 11:31 AM     [facility]

## 2020-02-23 NOTE — Patient Instructions (Signed)
I appreciate the opportunity to provide you with care for your health and wellness. Today we discussed: Tongue pain/questionable neck swelling  Follow up: As scheduled and as needed  No labs or referrals today  I recommend taking Benadryl in case this is an oral pharyngeal allergy related to something that she is eating.  I recommend seeing the dentist to make sure there is nothing else going on since she has not been seen in over a year.  Secondary to Covid.  As long as you are breathing well and not having any difficulty with your airway due to the above but if you start to feel like you are having difficulty breathing getting air in please go to your nearest emergency room or call 911.  Please continue to practice social distancing to keep you, your family, and our community safe.  If you must go out, please wear a mask and practice good handwashing.  It was a pleasure to see you and I look forward to continuing to work together on your health and well-being. Please do not hesitate to call the office if you need care or have questions about your care.  Have a wonderful day and week. With Gratitude, Cherly Beach, DNP, AGNP-BC

## 2020-02-26 ENCOUNTER — Ambulatory Visit: Payer: Medicare Other | Admitting: Family Medicine

## 2020-02-27 ENCOUNTER — Telehealth (HOSPITAL_COMMUNITY): Payer: Self-pay | Admitting: Psychiatry

## 2020-02-27 ENCOUNTER — Other Ambulatory Visit: Payer: Self-pay

## 2020-02-27 ENCOUNTER — Ambulatory Visit (HOSPITAL_COMMUNITY): Payer: Medicare Other | Admitting: Psychiatry

## 2020-02-27 NOTE — Telephone Encounter (Signed)
Therapist contacted patient for scheduled appointment.  However patient indicated she is sick.  Therapist and patient agreed to cancel today's appointment.

## 2020-03-05 DIAGNOSIS — Z20828 Contact with and (suspected) exposure to other viral communicable diseases: Secondary | ICD-10-CM | POA: Diagnosis not present

## 2020-03-07 ENCOUNTER — Encounter: Payer: Self-pay | Admitting: Family Medicine

## 2020-03-07 ENCOUNTER — Other Ambulatory Visit: Payer: Self-pay

## 2020-03-07 ENCOUNTER — Telehealth: Payer: Self-pay

## 2020-03-07 MED ORDER — ALBUTEROL SULFATE 1.25 MG/3ML IN NEBU: 1.2500 mg | INHALATION_SOLUTION | RESPIRATORY_TRACT | 1 refills | Status: DC | PRN

## 2020-03-07 NOTE — Telephone Encounter (Signed)
Medication refilled

## 2020-03-07 NOTE — Telephone Encounter (Signed)
Needs refill on albuterol neb solution

## 2020-03-12 ENCOUNTER — Other Ambulatory Visit: Payer: Self-pay

## 2020-03-12 ENCOUNTER — Telehealth (HOSPITAL_COMMUNITY): Payer: Self-pay | Admitting: Psychiatry

## 2020-03-12 ENCOUNTER — Ambulatory Visit (INDEPENDENT_AMBULATORY_CARE_PROVIDER_SITE_OTHER): Payer: Medicare Other | Admitting: Psychiatry

## 2020-03-12 DIAGNOSIS — F411 Generalized anxiety disorder: Secondary | ICD-10-CM

## 2020-03-12 NOTE — Telephone Encounter (Signed)
Therapist attempted to contact patient twice via text through [facility], no response.  Therapist called patient and left message indicating attempt and requesting patient call office.

## 2020-03-12 NOTE — Progress Notes (Addendum)
Virtual Visit via Video Note  I connected with Janet Acevedo on 03/12/20 at 1:20 PM EDT by a video enabled telemedicine application and verified that I am speaking with the correct person using two identifiers.   I discussed the limitations of evaluation and management by telemedicine and the availability of in person appointments. The patient expressed understanding and agreed to proceed.  I provided 31 minutes of non-face-to-face time during this encounter.   [name], LCSW               THERAPIST PROGRESS NOTE  Location:  Patient - home/ Provider - [place] office   Session Time: Tuesday 03/12/2020 1:20 PM - 1:51 PM     Participation Level: Active  Behavioral Response: CasualAlertAnxious  Type of Therapy: Individual Therapy      Treatment Goals addressed:  learn and implement cognitive and behavioral strategies to cope with anxiety  Interventions: CBT and Supportive   Summary: Janet Acevedo is a 29 y.o. female who is referred for services by psychiatrist Dr. Modesta Messing due to patient experiencing symptoms of anxiety. She denies any psychiatric hospitalizations and no previous involvement in psychotherapy.  patient reports having uncontrollable anxiety and finding herself in an anxious state every day. She reports having anxiety since childhood but has worsened in the last 1-2 years. She reports stress regarding an aunt who is a quadriplegic as her health has fluctuated. Patient reports being very close with his aunt. She also reports dealing with her sexuality and says she has had a girlfriend secretly for the past 8 years. She says she used to be okay with this but now being tired of hiding it. She also worries about her brother who is in prison and openly gay. Patient's reported symptoms of anxiety include "sweaty palms, hands tremble alot, voice is shakey, emotional outbursts of crying, increased heart rate".  Patient was last seen about 6 weeks ago by virtual  visit.  She experiences moderate symptoms of anxiety and depression including depressed mood, excessive worry, decreased involvement in activity, and nervousness. She reports increased stress as she and members of her household have been suffering with colds and respiratory issues for the past 3 weeks.  She expresses frustration relatives who visit do not follow safety protocols in the manner she expects.  She fears she may contract COVID-19 and has increased fear regarding the delta variant.  She also reports becoming more depressed thinking about the seasons changing and summer ending.  She reports decreased motivation and staying inside.  She reports having assertive conversation with her partner and says things seem to be a little bit better between the 2 of them now.   Suicidal/Homicidal: Nowithout intent/plan  Therapist Response:  Reviewed symptoms, patient reinforced patient's efforts to use assertiveness skills and communicating with partner, discussed effects, discussed stressors, facilitated expression of thoughts and feelings, validated feelings, assisted patient identify ways and develop possible script to improve assertive communication with her mother who is head of household to express her concerns about visitors and problem solve ways to address, discussed the role of behavioral activation in overcoming depression, developed plan with patient to use daily planning to improve routine and structure, will send patient handouts (weekly planner, activity menu), developed plan for patient to complete planner and bring to next session    Plan: Return again in 2 weeks.  Diagnosis: Axis I: Generalized Anxiety Disorder    Axis II: Deferred    [name], LCSW 03/12/2020

## 2020-03-21 DIAGNOSIS — L218 Other seborrheic dermatitis: Secondary | ICD-10-CM | POA: Diagnosis not present

## 2020-03-26 ENCOUNTER — Ambulatory Visit (HOSPITAL_COMMUNITY): Payer: Medicare Other | Admitting: Psychiatry

## 2020-03-26 ENCOUNTER — Telehealth (HOSPITAL_COMMUNITY): Payer: Self-pay | Admitting: Psychiatry

## 2020-03-26 ENCOUNTER — Other Ambulatory Visit: Payer: Self-pay

## 2020-03-26 NOTE — Telephone Encounter (Signed)
Therapist contacted patient via text through [facility] for scheduled appointment.  Patient canceled appointment due to schedule conflict.

## 2020-03-28 DIAGNOSIS — Z20828 Contact with and (suspected) exposure to other viral communicable diseases: Secondary | ICD-10-CM | POA: Diagnosis not present

## 2020-04-01 ENCOUNTER — Encounter: Payer: Self-pay | Admitting: Family Medicine

## 2020-04-02 ENCOUNTER — Other Ambulatory Visit: Payer: Self-pay | Admitting: Family Medicine

## 2020-04-02 MED ORDER — CEPHALEXIN 500 MG PO CAPS: 500.0000 mg | ORAL_CAPSULE | Freq: Two times a day (BID) | ORAL | 0 refills | Status: DC

## 2020-04-05 ENCOUNTER — Ambulatory Visit (INDEPENDENT_AMBULATORY_CARE_PROVIDER_SITE_OTHER): Payer: Medicare Other

## 2020-04-05 ENCOUNTER — Other Ambulatory Visit: Payer: Self-pay

## 2020-04-05 DIAGNOSIS — Z23 Encounter for immunization: Secondary | ICD-10-CM | POA: Diagnosis not present

## 2020-04-07 DIAGNOSIS — H33311 Horseshoe tear of retina without detachment, right eye: Secondary | ICD-10-CM | POA: Diagnosis not present

## 2020-04-16 ENCOUNTER — Ambulatory Visit (INDEPENDENT_AMBULATORY_CARE_PROVIDER_SITE_OTHER): Payer: Medicare Other | Admitting: Psychiatry

## 2020-04-16 ENCOUNTER — Other Ambulatory Visit: Payer: Self-pay

## 2020-04-16 DIAGNOSIS — F411 Generalized anxiety disorder: Secondary | ICD-10-CM

## 2020-04-16 NOTE — Progress Notes (Signed)
Virtual Visit via Video Note  I connected with Janet Acevedo on 04/16/20 at 1:13 PM EDT  by a video enabled telemedicine application and verified that I am speaking with the correct person using two identifiers.   I discussed the limitations of evaluation and management by telemedicine and the availability of in person appointments. The patient expressed understanding and agreed to proceed.  I provided 47 minutes of non-face-to-face time during this encounter.   [name], LCSW               THERAPIST PROGRESS NOTE  Location:  Patient - home/ Provider - [facility] office   Session Time: Tuesday 04/16/2020 1:13 PM - 2:00 PM     Participation Level: Active  Behavioral Response: CasualAlertAnxious  Type of Therapy: Individual Therapy      Treatment Goals addressed:  learn and implement cognitive and behavioral strategies to cope with anxiety  Interventions: CBT and Supportive   Summary: Janet Acevedo is a 29 y.o. female who is referred for services by psychiatrist Dr. Modesta Messing due to patient experiencing symptoms of anxiety. She denies any psychiatric hospitalizations and no previous involvement in psychotherapy.  patient reports having uncontrollable anxiety and finding herself in an anxious state every day. She reports having anxiety since childhood but has worsened in the last 1-2 years. She reports stress regarding an aunt who is a quadriplegic as her health has fluctuated. Patient reports being very close with his aunt. She also reports dealing with her sexuality and says she has had a girlfriend secretly for the past 8 years. She says she used to be okay with this but now being tired of hiding it. She also worries about her brother who is in prison and openly gay. Patient's reported symptoms of anxiety include "sweaty palms, hands tremble alot, voice is shakey, emotional outbursts of crying, increased heart rate".  Patient was last seen about 4 weeks ago by virtual  visit.  She experiences minimal symptoms of anxiety and depression.  She states she is starting to feel a little sadness and depression but doing what she can to combat it. .  She reports the past 2 weeks have been difficult as her primary caretakers, her mother and her partner, have been sick.  Prior to then, patient reports she had been going out more including shopping for groceries as well as shopping for a birthday present.  She reports this is the first time she has gone out like this in over a year and reports enjoying it.  She states changing her mindset and learning to cope with going out despite the pandemic.  She reports increased confidence in her ability to cope with going out.  She also reports having assertive conversations with mother about 2 different major issues and is very pleased with her efforts.  Patient states feeling accomplished.  She is beginning to have more interest in various activities and reports she is researching wood-burning as a possible hobby.  She has talked with her mother about this who is supportive and has offered to purchase patient starter kits.  Patient maintains medication compliance.  Patient reports she did receive handouts in the mail regarding daily planning but has not used.     Suicidal/Homicidal: Nowithout intent/plan  Therapist Response:  Reviewed symptoms, patient and reinforced patient's increased behavioral activation/use of assertiveness skills, discussed effects on mood/behavior/thoughts, discussed stressors, facilitated expression of thoughts and feelings, validated feelings, reviewed treatment plan, discussed next steps for treatment, discussed  stepdown plan for termination to include 4-5 more sessions (replacing depressive thinking with healthy alternative/improving consistency regarding behavioral activation/learning and implementing relapse prevention strategies,  reviewed rationale for using daily planning and activity menu, developed plan with  patient to use daily planning and bring forms to next session    Plan: Return again in 2 weeks.  Diagnosis: Axis I: Generalized Anxiety Disorder    Axis II: Deferred    [name], LCSW 04/16/2020

## 2020-04-30 ENCOUNTER — Other Ambulatory Visit: Payer: Self-pay

## 2020-04-30 ENCOUNTER — Ambulatory Visit (INDEPENDENT_AMBULATORY_CARE_PROVIDER_SITE_OTHER): Payer: Medicare Other | Admitting: Psychiatry

## 2020-04-30 DIAGNOSIS — F411 Generalized anxiety disorder: Secondary | ICD-10-CM | POA: Diagnosis not present

## 2020-04-30 NOTE — Progress Notes (Signed)
Virtual Visit via Video Note  I connected with Janet Acevedo on 04/30/20 at 1:16 PM EDT by a video enabled telemedicine application and verified that I am speaking with the correct person using two identifiers.   I discussed the limitations of evaluation and management by telemedicine and the availability of in person appointments. The patient expressed understanding and agreed to proceed.   I provided 44 minutes of non-face-to-face time during this encounter.   Janet Smoker, LCSW               THERAPIST PROGRESS NOTE  Location:  Patient - home/ Provider - [place] office   Session Time: Tuesday 04/30/2020 1:16 PM - 2:00 PM     Participation Level: Active  Behavioral Response: CasualAlertAnxious  Type of Therapy: Individual Therapy      Treatment Goals addressed:  learn and implement cognitive and behavioral strategies to cope with anxiety  Interventions: CBT and Supportive   Summary: Janet Acevedo is a 29 y.o. female who is referred for services by psychiatrist Dr. Modesta Messing due to patient experiencing symptoms of anxiety. She denies any psychiatric hospitalizations and no previous involvement in psychotherapy.  patient reports having uncontrollable anxiety and finding herself in an anxious state every day. She reports having anxiety since childhood but has worsened in the last 1-2 years. She reports stress regarding an aunt who is a quadriplegic as her health has fluctuated. Patient reports being very close with his aunt. She also reports dealing with her sexuality and says she has had a girlfriend secretly for the past 8 years. She says she used to be okay with this but now being tired of hiding it. She also worries about her brother who is in prison and openly gay. Patient's reported symptoms of anxiety include "sweaty palms, hands tremble alot, voice is shakey, emotional outbursts of crying, increased heart rate".  Patient was last seen about 3 weeks ago by  virtual visit.  She reports experiencing low energy and some irritability since last session.  She states not feeling depressed but not feeling as upbeat as she has in the past.  She is pleased she has responded by avoiding isolating self which she would have done in the past.  Instead, she states being more time in her living room with her mother, partner, and nephews.  She also implemented plan of daily planning discussed in last session and says this has been very helpful.  She has been engaging in activities regarding self-care, cooking, and journaling.  She also has continued research on wood-burning.  Patient is very pleased she took the initiative to order groceries on [place] cart.  She states this helped her to feel more independent.  Patient is very pleased with her efforts regarding increased behavioral activation and reports feeling more productive, useful.  She also has become more aware of her thought patterns and more easily recognizes depressive/anxiety provoking thought patterns.   Suicidal/Homicidal: Nowithout intent/plan  Therapist Response:  Reviewed symptoms, praised and reinforced patient's use of daily planning/increased behavioral activation, gust effects on mood/thought/behavior, developed plan with patient to continue daily planning, reviewed connection between thoughts/mood/behavior, discussed next steps to include addressing negative thought patterns, will send patient handouts in preparation for next session (CBT model, unhelpful thinking styles, conceptualizing dysfunctional thoughts)    Plan: Return again in 2 weeks.  Diagnosis: Axis I: Generalized Anxiety Disorder    Axis II: Deferred    Janet Smoker, LCSW 04/30/2020

## 2020-05-02 DIAGNOSIS — D3132 Benign neoplasm of left choroid: Secondary | ICD-10-CM | POA: Diagnosis not present

## 2020-05-02 DIAGNOSIS — H35463 Secondary vitreoretinal degeneration, bilateral: Secondary | ICD-10-CM | POA: Diagnosis not present

## 2020-05-02 DIAGNOSIS — D3131 Benign neoplasm of right choroid: Secondary | ICD-10-CM | POA: Diagnosis not present

## 2020-05-06 DIAGNOSIS — L218 Other seborrheic dermatitis: Secondary | ICD-10-CM | POA: Diagnosis not present

## 2020-05-07 DIAGNOSIS — G129 Spinal muscular atrophy, unspecified: Secondary | ICD-10-CM | POA: Diagnosis not present

## 2020-05-07 DIAGNOSIS — Z7984 Long term (current) use of oral hypoglycemic drugs: Secondary | ICD-10-CM | POA: Diagnosis not present

## 2020-05-07 DIAGNOSIS — G121 Other inherited spinal muscular atrophy: Secondary | ICD-10-CM | POA: Diagnosis not present

## 2020-05-07 DIAGNOSIS — J984 Other disorders of lung: Secondary | ICD-10-CM | POA: Diagnosis not present

## 2020-05-14 ENCOUNTER — Ambulatory Visit (INDEPENDENT_AMBULATORY_CARE_PROVIDER_SITE_OTHER): Payer: Medicare Other | Admitting: Psychiatry

## 2020-05-14 ENCOUNTER — Other Ambulatory Visit: Payer: Self-pay

## 2020-05-14 DIAGNOSIS — F411 Generalized anxiety disorder: Secondary | ICD-10-CM

## 2020-05-14 DIAGNOSIS — J4550 Severe persistent asthma, uncomplicated: Secondary | ICD-10-CM | POA: Diagnosis not present

## 2020-05-14 NOTE — Progress Notes (Signed)
Virtual Visit via Video Note  I connected with Janet Acevedo on 05/14/20 at 1:15 PM EDT  by a video enabled telemedicine application and verified that I am speaking with the correct person using two identifiers.  Location: Patient: Home Provider: [place] office   I provided 44 minutes of non-face-to-face time during this encounter.   [name], LCSW                THERAPIST PROGRESS NOTE  Location:  Patient - home/ Provider - [place] office   Session Time: Tuesday 05/14/2020 1:15 PM - 1:59 PM    Participation Level: Active  Behavioral Response: CasualAlertAnxious  Type of Therapy: Individual Therapy      Treatment Goals addressed: Patient wants to improve coping skills for anxiety, sustain her happiness without being dependent on relationship with others, improve self-esteem/self-worth, learn to speak up for self learn and implement cognitive and behavioral strategies to cope with anxiety  Interventions: CBT and Supportive   Summary: Janet Acevedo is a 29 y.o. female who is referred for services by psychiatrist Dr. Modesta Messing due to patient experiencing symptoms of anxiety. She denies any psychiatric hospitalizations and no previous involvement in psychotherapy.  patient reports having uncontrollable anxiety and finding herself in an anxious state every day. She reports having anxiety since childhood but has worsened in the last 1-2 years. She reports stress regarding an aunt who is a quadriplegic as her health has fluctuated. Patient reports being very close with his aunt. She also reports dealing with her sexuality and says she has had a girlfriend secretly for the past 8 years. She says she used to be okay with this but now being tired of hiding it. She also worries about her brother who is in prison and openly gay. Patient's reported symptoms of anxiety include "sweaty palms, hands tremble alot, voice is shakey, emotional outbursts of crying,  increased heart rate".  Patient was last seen about 3 weeks ago by virtual visit.  She reports experiencing increased symptoms of anxiety including initial and middle insomnia, extreme fluctuations in appetite, restlessness, hands trembling, ruminating thoughts, and crying spells.  Triggers include results from recent lung function tests that indicate patient's lung function has dropped by a couple of points.  Patient fears this means her muscles or weakening but says the doctor does not seem to be as concerned as there is a small variation.  However, patient will have repeat lung function test in 3 to 6 months.  She also reports stress regarding the relationship with her partner whom she suspects is cheating per patient's report, partner has been unfaithful in the past.  She also reports other past partners have been unfaithful as well.    Suicidal/Homicidal: Nowithout intent/plan  Therapist Response:  Reviewed symptoms, discussed stressors, facilitated expression of thoughts and feelings, validated feelings, assisted patient identify/challenge/and replace negative thought patterns about lung function test with helpful alternatives, assisted patient identify realistic versus reasonable expectations regarding the relationship with her partner, assisted patient identify thoughts that are inhibiting effective assertion, assisted patient identify ways to promote independence/self- confidence through pursuing her interests through activity planning    Plan: Return again in 2 weeks.  Diagnosis: Axis I: Generalized Anxiety Disorder    Axis II: Deferred    [name], LCSW 05/14/2020

## 2020-05-15 ENCOUNTER — Other Ambulatory Visit: Payer: Self-pay | Admitting: Family Medicine

## 2020-05-15 ENCOUNTER — Encounter: Payer: Self-pay | Admitting: Family Medicine

## 2020-05-16 DIAGNOSIS — M79676 Pain in unspecified toe(s): Secondary | ICD-10-CM | POA: Diagnosis not present

## 2020-05-16 DIAGNOSIS — L84 Corns and callosities: Secondary | ICD-10-CM | POA: Diagnosis not present

## 2020-05-16 DIAGNOSIS — G609 Hereditary and idiopathic neuropathy, unspecified: Secondary | ICD-10-CM | POA: Diagnosis not present

## 2020-05-23 ENCOUNTER — Other Ambulatory Visit: Payer: Self-pay | Admitting: Family Medicine

## 2020-05-23 DIAGNOSIS — F411 Generalized anxiety disorder: Secondary | ICD-10-CM

## 2020-05-23 DIAGNOSIS — F324 Major depressive disorder, single episode, in partial remission: Secondary | ICD-10-CM

## 2020-05-28 ENCOUNTER — Other Ambulatory Visit: Payer: Self-pay

## 2020-05-28 ENCOUNTER — Ambulatory Visit (HOSPITAL_COMMUNITY): Payer: Medicare Other | Admitting: Psychiatry

## 2020-05-28 ENCOUNTER — Ambulatory Visit (INDEPENDENT_AMBULATORY_CARE_PROVIDER_SITE_OTHER): Payer: Medicare Other | Admitting: Psychiatry

## 2020-05-28 DIAGNOSIS — F411 Generalized anxiety disorder: Secondary | ICD-10-CM
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
## 2020-06-04 ENCOUNTER — Telehealth: Payer: Medicare Other | Admitting: Psychiatry

## 2020-06-04 ENCOUNTER — Encounter: Payer: Medicare Other | Admitting: Psychiatry

## 2020-06-04 ENCOUNTER — Other Ambulatory Visit: Payer: Self-pay

## 2020-06-04 ENCOUNTER — Telehealth: Payer: Self-pay | Admitting: Psychiatry

## 2020-06-04 NOTE — Progress Notes (Deleted)
Psychiatric Initial Adult Assessment   Patient Identification: Janet Acevedo MRN:  [id number] Date of Evaluation:  06/04/2020 Referral Source: *** Chief Complaint:   Visit Diagnosis: No diagnosis found.  History of Present Illness:   CAYLEY PESTER is a 29 y.o. 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       # Generalized anxiety disorder # Social phobia Patient continues to report anxiety and occasional neurovegetative symptoms.  Psychosocial stressors including relationship issues, and she also talks about her brother who is in prison.  She feels ambivalent about starting sertraline with concern for potential side effect, although she is willing to try this weekend.  Provided psycho education about medication.  Discussed effective communication.  Discussed self compassion.  She will continue to see Ms. Bynum for therapy.   Plan I have reviewed and updated plans as below 1. Start sertraline 25 mg daily for two week, then 50 mg daily  2. Continue Xanax 0.25 mg as needed for anxiety(prescribed by PCP) 3.Return to clinic in one month for 30 mins   medication: paxil ("fog"), buspar (dizziness), Xanax,   Daily routine: Exercise: Employment:  Support: Household:  Marital status: Number of children:   Associated Signs/Symptoms: Depression Symptoms:  {DEPRESSION SYMPTOMS:20000} (Hypo) Manic Symptoms:  {BHH MANIC SYMPTOMS:22872} Anxiety Symptoms:  {BHH ANXIETY SYMPTOMS:22873} Psychotic Symptoms:  {BHH PSYCHOTIC SYMPTOMS:22874} PTSD Symptoms: {BHH PTSD SYMPTOMS:22875}  Past Psychiatric History:  Outpatient:  Psychiatry admission:  Previous suicide attempt:  Past trials of medication:  History of violence:   Previous Psychotropic Medications: {YES/NO:21197}  Substance Abuse History in the last 12 months:  {yes no:314532}  Consequences of Substance Abuse: {BHH  CONSEQUENCES OF SUBSTANCE ABUSE:22880}  Past Medical History:  Past Medical History:  Diagnosis Date  . Allergy   . Anxiety   . Asthma   . Depression, major, single episode, moderate ([place]) 09/05/2017  . GERD (gastroesophageal reflux disease)   . Neuromuscular disorder ([place])   . Osteomyelitis [facility]) June 2011  . Otitis externa, eczematoid 05/28/2019  . Perennial allergic rhinitis   . Seborrheic dermatitis of scalp 2006  . Spinal muscle atrophy ([place])    type 2/3 18 months    Past Surgical History:  Procedure Laterality Date  . bilateral hip reinsertion  under age 35   hips out of socket, pt was only able to cruise hold on and move short distances   . BONE BIOPSY     spinal bone   . MIRENA PLACED 10/27/10    . SPINAL FUSION  1999   rods placed in thoracolumbar spine to excessive scoliosis primarily     Family Psychiatric History: ***  Family History:  Family History  Problem Relation Age of Onset  . Hyperlipidemia Mother   . Hypertension Mother   . Hypertension Brother   . Diabetes Brother        borderline   . Anxiety disorder Brother   . Depression Brother   . Drug abuse Brother     Social History:   Social History   Socioeconomic History  . Marital status: Single    Spouse name: Not on file  . Number of children: Not on file  . Years of education: Not on file  . Highest education level: Associate degree: academic program  Occupational History  . Occupation: Ship broker  Tobacco Use  . Smoking status: Never Smoker  . Smokeless tobacco: Never Used  Vaping  Use  . Vaping Use: Never used  Substance and Sexual Activity  . Alcohol use: No  . Drug use: No  . Sexual activity: Never  Other Topics Concern  . Not on file  Social History Narrative   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[place] Strain: [place]   . Difficulty of Paying Living Expenses: Not hard at all  Food Insecurity: No Food  Insecurity  . Worried About Charity fundraiser in the Last Year: Never true  . Ran Out of Food in the Last Year: Never true  Transportation Needs: No Transportation Needs  . Lack of Transportation (Medical): No  . Lack of Transportation (Non-Medical): No  Physical Activity: Inactive  . Days of Exercise per Week: 0 days  . Minutes of Exercise per Session: 0 min  Stress: No Stress Concern Present  . Feeling of Stress : Not at all  Social Connections: Moderately Isolated  . Frequency of Communication with Friends and Family: More than three times a week  . Frequency of Social Gatherings with Friends and Family: More than three times a week  . Attends Religious Services: Never  . Active Member of Clubs or Organizations: Yes  . Attends Archivist Meetings: Never  . Marital Status: Never married    Additional Social History: ***  Allergies:   Allergies  Allergen Reactions  . Other Diarrhea  . Ciprofloxacin Nausea And Vomiting    Metabolic Disorder Labs: Lab Results  Component Value Date   HGBA1C 4.7 01/06/2018   MPG 88 01/06/2018   MPG 100 02/08/2017   No results found for: PROLACTIN Lab Results  Component Value Date   CHOL 197 10/06/2018   TRIG 53 10/06/2018   HDL 43 (L) 10/06/2018   CHOLHDL 4.6 10/06/2018   VLDL 12 02/08/2017   LDLCALC 140 (H) 10/06/2018   LDLCALC 131 (H) 01/06/2018   Lab Results  Component Value Date   TSH 1.06 01/06/2018    Therapeutic Level Labs: No results found for: LITHIUM No results found for: CBMZ No results found for: VALPROATE  Current Medications: Current Outpatient Medications  Medication Sig Dispense Refill  . albuterol (ACCUNEB) 1.25 MG/3ML nebulizer solution Take 3 mLs (1.25 mg total) by nebulization every 4 (four) hours as needed. Dx J45.40 75 mL 1  . albuterol (VENTOLIN HFA) 108 (90 Base) MCG/ACT inhaler INHALE 2 PUFFS INTO THE LUNGS EVERY 4 (FOUR) HOURS AS NEEDED FOR WHEEZING OR SHORTNESS OF BREATH. 18 g 5  .  betamethasone dipropionate (DIPROLENE) 0.05 % cream APPLY TOPICALLY 2 (TWO) TIMES DAILY. 45 g 3  . Biotin (BIOTIN MAXIMUM STRENGTH) 10 MG TABS Take 1 tablet by mouth daily.    . busPIRone (BUSPAR) 5 MG tablet TAKE 1 TABLET BY MOUTH TWICE A DAY FOR ANXIETY 180 tablet 2  . cephALEXin (KEFLEX) 500 MG capsule Take 1 capsule (500 mg total) by mouth 2 (two) times daily. 10 capsule 0  . cromolyn (OPTICROM) 4 % ophthalmic solution PLACE 1 DROP INTO BOTH EYES FOUR TIMES DAILY 40 mL 8  . Fluocinolone Acetonide 0.01 % OIL Apply sparingly to scalp two times  Weekly, as needed, for itch and flaking 20 mL 1  . Fluocinolone Acetonide Scalp 0.01 % OIL Apply 1 application topically as directed.    . fluticasone (FLONASE) 50 MCG/ACT nasal spray Place 2 sprays into both nostrils daily. 11.1 mL 3  . ketoconazole (NIZORAL) 2 %  cream APPLY 1 APPLICATION TOPICALLY AS NEEDED FOR DERMATITIS 15 g 3  . ketoconazole (NIZORAL) 2 % shampoo APPLY TO SCALP FOR 5 MINUTES AND RINSE OUT 120 mL 4  . levocetirizine (XYZAL) 5 MG tablet TAKE 1 TABLET BY MOUTH EVERY DAY IN THE EVENING 90 tablet 3  . montelukast (SINGULAIR) 10 MG tablet TAKE 1 TABLET BY MOUTH EVERY DAY 90 tablet 3  . nystatin (MYCOSTATIN/NYSTOP) powder APPLY TO AFFECTED AREA TWICE A DAY AS NEEDED 60 g 3  . omeprazole (PRILOSEC) 20 MG capsule THE PATIENT HAS TROUBLE SWALLOWING. PLEASE TAKE 2 CAPSULES TWICE A DAY BY MOUTH 360 capsule 3  . ondansetron (ZOFRAN) 4 MG tablet Take one tablet by mouth once daily , as needed, for nausea 30 tablet 1  . Respiratory Therapy Supplies (NEBULIZER/ADULT MASK) KIT Insurance preference 1 each 0  . Respiratory Therapy Supplies (NEBULIZER/TUBING/MOUTHPIECE) KIT Use as directed. Insurance preference. Nebulizer kit and mask 1 each 0  . sertraline (ZOLOFT) 25 MG tablet TAKE 1 TABLET BY MOUTH EVERY DAY 90 tablet 1  . Spacer/Aero-Holding Chambers (AEROCHAMBER PLUS) inhaler Use as instructed 1 each 2  . SYMBICORT 160-4.5 MCG/ACT inhaler TAKE 2  PUFFS BY MOUTH TWICE A DAY 30.6 Inhaler 1  . UNABLE TO FIND Gloves- 1 box per month as needed 200 each prn  . UNABLE TO FIND Under pads- for use daily as needed 100 each prn  . UNABLE TO FIND Incontinence liners- use as needed for incontinence Gloves- 1 box as needed for incontinence DX urinary incontinence 1 each 0   No current facility-administered medications for this visit.    Musculoskeletal: Strength & Muscle Tone: N/A Gait & Station: N/A Patient leans: N/A  Psychiatric Specialty Exam: Review of Systems  There were no vitals taken for this visit.There is no height or weight on file to calculate BMI.  General Appearance: {Appearance:22683}  Eye Contact:  {BHH EYE CONTACT:22684}  Speech:  Clear and Coherent  Volume:  Normal  Mood:  {BHH MOOD:22306}  Affect:  {Affect (PAA):22687}  Thought Process:  Coherent and Goal Directed  Orientation:  Full (Time, Place, and Person)  Thought Content:  Logical  Suicidal Thoughts:  {ST/HT (PAA):22692}  Homicidal Thoughts:  {ST/HT (PAA):22692}  Memory:  Immediate;   Good  Judgement:  {Judgement (PAA):22694}  Insight:  {Insight (PAA):22695}  Psychomotor Activity:  Normal  Concentration:  Concentration: Good and Attention Span: Good  Recall:  Good  Fund of Knowledge:Good  Language: Good  Akathisia:  No  Handed:  Right  AIMS (if indicated):  not done  Assets:  Communication Skills Desire for Improvement  ADL's:  Intact  Cognition: WNL  Sleep:  {BHH GOOD/FAIR/POOR:22877}   Screenings: GAD-7     Video Visit from 01/03/2020 in [facility] from 12/12/2019 in [facility] Office Visit from 09/27/2019 in [facility] Office Visit from 10/06/2018 in [place] from 04/19/2018 in [facility]  Total GAD-7 Score $RemoveBef'7 5 15 11 10 '[id number]$ (P)     PHQ2-9     Video Visit from 02/23/2020 in [facility] Video Visit  from 01/22/2020 in [facility] Video Visit from 01/03/2020 in [facility] Office Visit from 09/27/2019 in [facility] Video Visit from 05/23/2019 in [facility]  PHQ-2 Total Score 0 0 0 4 2  PHQ-9 Total Score -- 0 -- 13 10      Assessment and Plan:  Assessment  Plan  The  patient demonstrates the following risk factors for suicide: Chronic risk factors for suicide include: {Chronic Risk Factors for RKYHCWC:37628315}. Acute risk factors for suicide include: {Acute Risk Factors for VVOHYWV:37106269}. Protective factors for this patient include: {Protective Factors for Suicide SWNI:62703500}. Considering these factors, the overall suicide risk at this point appears to be {Desc; low/moderate/high:110033}. Patient {ACTION; IS/IS XFG:18299371} appropriate for outpatient follow up.       Norman Clay, MD 11/9/20218:45 AM This encounter was created in error - please disregard.

## 2020-06-04 NOTE — Telephone Encounter (Signed)
Sent link for video visit through Epic. Patient did not sign in. Called the patient  for appointment scheduled today. The patient did not answer the phone. Left voice message to contact the office.  

## 2020-06-05 ENCOUNTER — Ambulatory Visit (INDEPENDENT_AMBULATORY_CARE_PROVIDER_SITE_OTHER): Payer: Medicare Other | Admitting: Psychiatry

## 2020-06-05 DIAGNOSIS — F411 Generalized anxiety disorder: Secondary | ICD-10-CM | POA: Diagnosis not present

## 2020-06-05 NOTE — Progress Notes (Signed)
Virtual Visit via Video Note  I connected with Janet Acevedo on 06/05/20 at 10:15 AM EST  by a video enabled telemedicine application and verified that I am speaking with the correct person using two identifiers.  Location: Patient: Home Provider: [place] office    I discussed the limitations of evaluation and management by telemedicine and the availability of in person appointments. The patient expressed understanding and agreed to proceed.   I provided 45 minutes of non-face-to-face time during this encounter.   [name], LCSW                  THERAPIST PROGRESS NOTE  Location:  Patient - home/ Provider - [place] office   Session Time: Wednesday 06/05/2020 10:15 AM -  11:00 AM     Participation Level: Active  Behavioral Response: CasualAlertAnxious/sad  Type of Therapy: Individual Therapy      Treatment Goals addressed: Patient wants to improve coping skills for anxiety, sustain her happiness without being dependent on relationship with others, improve self-esteem/self-worth, learn to speak up for self learn and implement cognitive and behavioral strategies to cope with anxiety  Interventions: CBT and Supportive   Summary: Janet Acevedo is a 29 y.o. female who is referred for services by psychiatrist Dr. Modesta Messing due to patient experiencing symptoms of anxiety. She denies any psychiatric hospitalizations and no previous involvement in psychotherapy.  patient reports having uncontrollable anxiety and finding herself in an anxious state every day. She reports having anxiety since childhood but has worsened in the last 1-2 years. She reports stress regarding an aunt who is a quadriplegic as her health has fluctuated. Patient reports being very close with his aunt. She also reports dealing with her sexuality and says she has had a girlfriend secretly for the past 8 years. She says she used to be okay with this but now being tired of hiding  it. She also worries about her brother who is in prison and openly gay. Patient's reported symptoms of anxiety include "sweaty palms, hands tremble alot, voice is shakey, emotional outbursts of crying, increased heart rate".  Patient was last seen about 2 weeks ago by virtual visit.  She reports continued stress and symptoms of anxiety including initial and middle insomnia, extreme fluctuations in appetite, restlessness, hands trembling, ruminating thoughts, and crying spells.  Per her report, her partner has gotten her own apartment and plans to move out this weekend.  Patient expresses sadness and hurt as she reports being blindsided.  She reports trying to disrupt her usual pattern in reacting to her partner's behaviors.  She is having difficulty doing this due to dependency issues as well as fear of dealing with distressful emotions. She reports she has been journaling, listening to music, using deep breathing, meditation, and talking with her mother who has been very supportive.  Suicidal/Homicidal: Nowithout intent/plan  Therapist Response:  Reviewed symptoms, discussed stressors, facilitated expression of thoughts and feelings, validated feelings, praised and reinforced being more mindful of her thoughts and emotions/friends to use helpful coping strategies, discussed effects, assisted patient identify realistic expectations of interaction with her partner and ways to set maintain limits, assisted patient identify ways to improve distress tolerance skills using DBT skills (ACCEPTS),   Plan: Return again in 2 weeks.  Diagnosis: Axis I: Generalized Anxiety Disorder    Axis II: Deferred    [name], LCSW 06/05/2020

## 2020-06-09 NOTE — Progress Notes (Signed)
This encounter was created in error - please disregard.

## 2020-06-10 ENCOUNTER — Other Ambulatory Visit: Payer: Self-pay | Admitting: Family Medicine

## 2020-06-10 NOTE — Progress Notes (Deleted)
Psychiatric Initial Adult Assessment   Patient Identification: Janet Acevedo MRN:  [id number] Date of Evaluation:  06/10/2020 Referral Source: *** Chief Complaint:   Visit Diagnosis: No diagnosis found.  History of Present Illness:   MISHAAL Acevedo is a 29 y.o. year old female with a history of anxiety, type II spinal muscular atrophy (wheel chair dependent), micrognasia,, who is referred for anxiety.      Associated Signs/Symptoms: Depression Symptoms:  {DEPRESSION SYMPTOMS:20000} (Hypo) Manic Symptoms:  {BHH MANIC SYMPTOMS:22872} Anxiety Symptoms:  {BHH ANXIETY SYMPTOMS:22873} Psychotic Symptoms:  {BHH PSYCHOTIC SYMPTOMS:22874} PTSD Symptoms: {BHH PTSD SYMPTOMS:22875}  Past Psychiatric History:  Outpatient:  Psychiatry admission:  Previous suicide attempt:  Past trials of medication:  History of violence:   Previous Psychotropic Medications: {YES/NO:21197}  Substance Abuse History in the last 12 months:  {yes no:314532}  Consequences of Substance Abuse: {BHH CONSEQUENCES OF SUBSTANCE ABUSE:22880}  Past Medical History:  Past Medical History:  Diagnosis Date  . Allergy   . Anxiety   . Asthma   . Depression, major, single episode, moderate ([place]) 09/05/2017  . GERD (gastroesophageal reflux disease)   . Neuromuscular disorder ([place])   . Osteomyelitis [facility]) June 2011  . Otitis externa, eczematoid 05/28/2019  . Perennial allergic rhinitis   . Seborrheic dermatitis of scalp 2006  . Spinal muscle atrophy ([place])    type 2/3 18 months    Past Surgical History:  Procedure Laterality Date  . bilateral hip reinsertion  under age 43   hips out of socket, pt was only able to cruise hold on and move short distances   . BONE BIOPSY     spinal bone   . MIRENA PLACED 10/27/10    . SPINAL FUSION  1999   rods placed in thoracolumbar spine to excessive scoliosis primarily     Family Psychiatric History: ***  Family History:  Family History  Problem Relation Age of Onset   . Hyperlipidemia Mother   . Hypertension Mother   . Hypertension Brother   . Diabetes Brother        borderline   . Anxiety disorder Brother   . Depression Brother   . Drug abuse Brother     Social History:   Social History   Socioeconomic History  . Marital status: Single    Spouse name: Not on file  . Number of children: Not on file  . Years of education: Not on file  . Highest education level: Associate degree: academic program  Occupational History  . Occupation: Ship broker  Tobacco Use  . Smoking status: Never Smoker  . Smokeless tobacco: Never Used  Vaping Use  . Vaping Use: Never used  Substance and Sexual Activity  . Alcohol use: No  . Drug use: No  . Sexual activity: Never  Other Topics Concern  . Not on file  Social History Narrative   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[place] Strain: [place]   . Difficulty of Paying Living Expenses: Not hard at all  Food Insecurity: No Food Insecurity  . Worried About Charity fundraiser in the Last Year: Never true  . Ran Out of Food in the Last Year: Never true  Transportation Needs: No Transportation Needs  . Lack of Transportation (Medical): No  . Lack of Transportation (Non-Medical): No  Physical Activity: Inactive  . Days of Exercise per Week: 0 days  . Minutes of Exercise per  Session: 0 min  Stress: No Stress Concern Present  . Feeling of Stress : Not at all  Social Connections: Moderately Isolated  . Frequency of Communication with Friends and Family: More than three times a week  . Frequency of Social Gatherings with Friends and Family: More than three times a week  . Attends Religious Services: Never  . Active Member of Clubs or Organizations: Yes  . Attends Archivist Meetings: Never  . Marital Status: Never married    Additional Social History: ***  Allergies:   Allergies  Allergen Reactions  . Other Diarrhea  .  Ciprofloxacin Nausea And Vomiting    Metabolic Disorder Labs: Lab Results  Component Value Date   HGBA1C 4.7 01/06/2018   MPG 88 01/06/2018   MPG 100 02/08/2017   No results found for: PROLACTIN Lab Results  Component Value Date   CHOL 197 10/06/2018   TRIG 53 10/06/2018   HDL 43 (L) 10/06/2018   CHOLHDL 4.6 10/06/2018   VLDL 12 02/08/2017   LDLCALC 140 (H) 10/06/2018   LDLCALC 131 (H) 01/06/2018   Lab Results  Component Value Date   TSH 1.06 01/06/2018    Therapeutic Level Labs: No results found for: LITHIUM No results found for: CBMZ No results found for: VALPROATE  Current Medications: Current Outpatient Medications  Medication Sig Dispense Refill  . albuterol (ACCUNEB) 1.25 MG/3ML nebulizer solution Take 3 mLs (1.25 mg total) by nebulization every 4 (four) hours as needed. Dx J45.40 75 mL 1  . albuterol (VENTOLIN HFA) 108 (90 Base) MCG/ACT inhaler INHALE 2 PUFFS INTO THE LUNGS EVERY 4 (FOUR) HOURS AS NEEDED FOR WHEEZING OR SHORTNESS OF BREATH. 18 g 5  . betamethasone dipropionate (DIPROLENE) 0.05 % cream APPLY TOPICALLY 2 (TWO) TIMES DAILY. 45 g 3  . Biotin (BIOTIN MAXIMUM STRENGTH) 10 MG TABS Take 1 tablet by mouth daily.    . busPIRone (BUSPAR) 5 MG tablet TAKE 1 TABLET BY MOUTH TWICE A DAY FOR ANXIETY 180 tablet 2  . cephALEXin (KEFLEX) 500 MG capsule Take 1 capsule (500 mg total) by mouth 2 (two) times daily. 10 capsule 0  . cromolyn (OPTICROM) 4 % ophthalmic solution PLACE 1 DROP INTO BOTH EYES FOUR TIMES DAILY 40 mL 8  . Fluocinolone Acetonide 0.01 % OIL Apply sparingly to scalp two times  Weekly, as needed, for itch and flaking 20 mL 1  . Fluocinolone Acetonide Scalp 0.01 % OIL Apply 1 application topically as directed.    . fluticasone (FLONASE) 50 MCG/ACT nasal spray Place 2 sprays into both nostrils daily. 11.1 mL 3  . ketoconazole (NIZORAL) 2 % cream APPLY 1 APPLICATION TOPICALLY AS NEEDED FOR DERMATITIS 15 g 3  . ketoconazole (NIZORAL) 2 % shampoo APPLY TO  SCALP FOR 5 MINUTES AND RINSE OUT 120 mL 4  . levocetirizine (XYZAL) 5 MG tablet TAKE 1 TABLET BY MOUTH EVERY DAY IN THE EVENING 90 tablet 3  . montelukast (SINGULAIR) 10 MG tablet TAKE 1 TABLET BY MOUTH EVERY DAY 90 tablet 3  . nystatin (MYCOSTATIN/NYSTOP) powder APPLY TO AFFECTED AREA TWICE A DAY AS NEEDED 60 g 3  . omeprazole (PRILOSEC) 20 MG capsule THE PATIENT HAS TROUBLE SWALLOWING. PLEASE TAKE 2 CAPSULES TWICE A DAY BY MOUTH 360 capsule 3  . ondansetron (ZOFRAN) 4 MG tablet Take one tablet by mouth once daily , as needed, for nausea 30 tablet 1  . Respiratory Therapy Supplies (NEBULIZER/ADULT MASK) KIT Insurance preference 1 each 0  . Respiratory Therapy Supplies (  NEBULIZER/TUBING/MOUTHPIECE) KIT Use as directed. Insurance preference. Nebulizer kit and mask 1 each 0  . sertraline (ZOLOFT) 25 MG tablet TAKE 1 TABLET BY MOUTH EVERY DAY 90 tablet 1  . Spacer/Aero-Holding Chambers (AEROCHAMBER PLUS) inhaler Use as instructed 1 each 2  . SYMBICORT 160-4.5 MCG/ACT inhaler TAKE 2 PUFFS BY MOUTH TWICE A DAY 30.6 Inhaler 1  . UNABLE TO FIND Gloves- 1 box per month as needed 200 each prn  . UNABLE TO FIND Under pads- for use daily as needed 100 each prn  . UNABLE TO FIND Incontinence liners- use as needed for incontinence Gloves- 1 box as needed for incontinence DX urinary incontinence 1 each 0   No current facility-administered medications for this visit.    Musculoskeletal: Strength & Muscle Tone: N/A Gait & Station: N/A Patient leans: N/A  Psychiatric Specialty Exam: Review of Systems  There were no vitals taken for this visit.There is no height or weight on file to calculate BMI.  General Appearance: {Appearance:22683}  Eye Contact:  {BHH EYE CONTACT:22684}  Speech:  Clear and Coherent  Volume:  Normal  Mood:  {BHH MOOD:22306}  Affect:  {Affect (PAA):22687}  Thought Process:  Coherent  Orientation:  Full (Time, Place, and Person)  Thought Content:  Logical  Suicidal Thoughts:   {ST/HT (PAA):22692}  Homicidal Thoughts:  {ST/HT (PAA):22692}  Memory:  Immediate;   Good  Judgement:  {Judgement (PAA):22694}  Insight:  {Insight (PAA):22695}  Psychomotor Activity:  Normal  Concentration:  Concentration: Good and Attention Span: Good  Recall:  Good  Fund of Knowledge:Good  Language: Good  Akathisia:  No  Handed:  Right  AIMS (if indicated):  not done  Assets:  Communication Skills Desire for Improvement  ADL's:  Intact  Cognition: WNL  Sleep:  {BHH GOOD/FAIR/POOR:22877}   Screenings: GAD-7     Video Visit from 01/03/2020 in [place] from 12/12/2019 in [place] Office Visit from 09/27/2019 in [facility] Office Visit from 10/06/2018 in [place] from 04/19/2018 in [facility]  Total GAD-7 Score _0 (P)     PHQ2-9     Video Visit from 02/23/2020 in [facility] Video Visit from 01/22/2020 in [facility] Video Visit from 01/03/2020 in [facility] Office Visit from 09/27/2019 in [facility] Video Visit from 05/23/2019 in [facility]  PHQ-2 Total Score 0 0 0 4 2  PHQ-9 Total Score -- 0 -- 13 10      Assessment and Plan:  SUKANYA GOLDBLATT is a 29 y.o. year old female with a history of , who presents for follow up appointment for below.       # Generalized anxiety disorder # Social phobia Patient continues to report anxiety and occasional neurovegetative symptoms.  Psychosocial stressors including relationship issues, and she also talks about her brother who is in prison.  She feels ambivalent about starting sertraline with concern for potential side effect, although she is willing to try this weekend.  Provided psycho education about medication.  Discussed effective communication.  Discussed self compassion.  She will continue to see Ms. Bynum for therapy.    Plan  1. Start sertraline 25 mg daily for two week, then 50 mg daily  2. Continue Xanax 0.25 mg as needed for anxiety(prescribed by PCP) 3.Return to clinic in one month for 30 mins    The patient demonstrates the following risk factors for suicide:  Chronic risk factors for suicide include:psychiatric disorder ofanxiety. Acute risk factorsfor suicide include: loss (financial, interpersonal, professional). Protective factorsfor this patient include: positive social support, coping skills and hope for the future. Considering these factors, the overall suicide risk at this point appears to below. Patientisappropriate for outpatient follow up.  Norman Clay, MD 11/15/20218:55 AM

## 2020-06-11 ENCOUNTER — Telehealth: Payer: Medicare Other | Admitting: Psychiatry

## 2020-06-12 ENCOUNTER — Telehealth (INDEPENDENT_AMBULATORY_CARE_PROVIDER_SITE_OTHER): Payer: Medicare Other | Admitting: Family Medicine

## 2020-06-12 ENCOUNTER — Encounter: Payer: Self-pay | Admitting: Family Medicine

## 2020-06-12 DIAGNOSIS — J4541 Moderate persistent asthma with (acute) exacerbation: Secondary | ICD-10-CM

## 2020-06-12 DIAGNOSIS — J302 Other seasonal allergic rhinitis: Secondary | ICD-10-CM | POA: Diagnosis not present

## 2020-06-12 DIAGNOSIS — F411 Generalized anxiety disorder: Secondary | ICD-10-CM | POA: Diagnosis not present

## 2020-06-12 DIAGNOSIS — F321 Major depressive disorder, single episode, moderate: Secondary | ICD-10-CM | POA: Diagnosis not present

## 2020-06-12 MED ORDER — SERTRALINE HCL 50 MG PO TABS: 50.0000 mg | ORAL_TABLET | Freq: Every day | ORAL | 3 refills | Status: DC

## 2020-06-12 MED ORDER — BUSPIRONE HCL 7.5 MG PO TABS: 7.5000 mg | ORAL_TABLET | Freq: Two times a day (BID) | ORAL | 2 refills | Status: DC

## 2020-06-12 MED ORDER — FLUTICASONE PROPIONATE 50 MCG/ACT NA SUSP: 2.0000 | Freq: Every day | NASAL | 4 refills | Status: DC

## 2020-06-12 MED ORDER — AZELASTINE HCL 0.1 % NA SOLN: 2.0000 | Freq: Two times a day (BID) | NASAL | 12 refills | Status: DC

## 2020-06-12 NOTE — Progress Notes (Signed)
Virtual Visit via Telephone Note  I connected with Janet Acevedo on 06/12/20 at  2:00 PM EST by telephone and verified that I am speaking with the correct person using two identifiers.  Location: Patient: home Provider: office   I discussed the limitations, risks, security and privacy concerns of performing an evaluation and management service by telephone and the availability of in person appointments. I also discussed with the patient that there may be a patient responsible charge related to this service. The patient expressed understanding and agreed to proceed.   History of Present Illness: 4 day h/o sore throat, runny stuffy nose, headache, sinus pressure, cough with chest sore , drainage , no fever or chills , clear nasal C/o uncontrolled depression and anxiety, not suicidal or homicidal trigger is unexpected loss of her caregiver who she trusted as a friend for many years   Observations/Objective: There were no vitals taken for this visit. Good communication with no confusion and intact memory. Alert and oriented x 3 No signs of respiratory distress during speech    Assessment and Plan: Depression, major, single episode, moderate (HCC) Increase dose of zoloft and refer to Psych, has an appt with Psych already  GAD (generalized anxiety disorder) uncntrolled , increase dose of buspar, continue therapy and re establish with Psych  Moderate persistent asthma Currently reports increased need for neb treatments, and current neb machine reportedly old and poorly functional, request new Neb machien as needing to do breathing treatments on a daily basis this past week  Seasonal allergies Increased and uncontrolled ad additional allergy meds daily    Follow Up Instructions:    I discussed the assessment and treatment plan with the patient. The patient was provided an opportunity to ask questions and all were answered. The patient agreed with the plan and demonstrated an  understanding of the instructions.   The patient was advised to call back or seek an in-person evaluation if the symptoms worsen or if the condition fails to improve as anticipated.  I provided 20 minutes of non-face-to-face time during this encounter.   Tula Nakayama, MD

## 2020-06-12 NOTE — Patient Instructions (Addendum)
F/u with video visit with MD end December or first week in January  Increased dose of buspar, 7.5 mg twice daily  Increased dose of zoloft to 50 mg one daily  New additional nasal sprays for uncontrolled allergies  Order will be sent to [facility] for new neb machine , currently using on average  Daily this past week with allergies increased and uncontrolled  . Nurse please follow through on this  Thanks for choosing [facility], we consider it a privelige to serve you. Best for 2021/ 2022, things  WILL get better!!

## 2020-06-13 ENCOUNTER — Ambulatory Visit: Payer: Medicare Other | Admitting: Family Medicine

## 2020-06-14 ENCOUNTER — Encounter: Payer: Self-pay | Admitting: Family Medicine

## 2020-06-14 NOTE — Assessment & Plan Note (Signed)
Currently reports increased need for neb treatments, and current neb machine reportedly old and poorly functional, request new Neb machien as needing to do breathing treatments on a daily basis this past week

## 2020-06-14 NOTE — Assessment & Plan Note (Signed)
uncntrolled , increase dose of buspar, continue therapy and re establish with Psych

## 2020-06-14 NOTE — Assessment & Plan Note (Signed)
Increased and uncontrolled ad additional allergy meds daily

## 2020-06-14 NOTE — Assessment & Plan Note (Signed)
Increase dose of zoloft and refer to Psych, has an appt with Psych already

## 2020-06-21 ENCOUNTER — Telehealth: Payer: Medicare Other | Admitting: Family

## 2020-06-21 ENCOUNTER — Encounter: Payer: Self-pay | Admitting: Family Medicine

## 2020-06-21 DIAGNOSIS — J45901 Unspecified asthma with (acute) exacerbation: Secondary | ICD-10-CM

## 2020-06-21 MED ORDER — ALBUTEROL SULFATE HFA 108 (90 BASE) MCG/ACT IN AERS: INHALATION_SPRAY | RESPIRATORY_TRACT | 5 refills | Status: DC

## 2020-06-21 MED ORDER — ALBUTEROL SULFATE (2.5 MG/3ML) 0.083% IN NEBU: 2.5000 mg | INHALATION_SOLUTION | Freq: Four times a day (QID) | RESPIRATORY_TRACT | 1 refills | Status: DC | PRN

## 2020-06-21 MED ORDER — PREDNISONE 20 MG PO TABS: 40.0000 mg | ORAL_TABLET | Freq: Every day | ORAL | 0 refills | Status: AC

## 2020-06-21 MED ORDER — ALBUTEROL SULFATE 1.25 MG/3ML IN NEBU: 1.2500 mg | INHALATION_SOLUTION | RESPIRATORY_TRACT | 1 refills | Status: DC | PRN

## 2020-06-21 NOTE — Addendum Note (Signed)
Addended by: Evelina Dun A on: 06/21/2020 05:55 PM   Modules accepted: Orders

## 2020-06-21 NOTE — Progress Notes (Signed)
Visit for Asthma  Based on what you have shared with me, it looks like you may have a flare up of your asthma.  Asthma is a chronic (ongoing) lung disease which results in airway obstruction, inflammation and hyper-responsiveness.   Asthma symptoms vary from person to person, with common symptoms including nighttime awakening and decreased ability to participate in normal activities as a result of shortness of breath. It is often triggered by changes in weather, changes in the season, changes in air temperature, or inside (home, school, daycare or work) allergens such as animal dander, mold, mildew, woodstoves or cockroaches.   It can also be triggered by hormonal changes, extreme emotion, physical exertion or an upper respiratory tract illness.     It is important to identify the trigger, and then eliminate or avoid the trigger if possible.   If you have been prescribed medications to be taken on a regular basis, it is important to follow the asthma action plan and to follow guidelines to adjust medication in response to increasing symptoms of decreased peak expiratory flow rate  Treatment: I have prescribed: Albuterol (Proventil HFA; Ventolin HFA) 108 (90 Base) MCG/ACT Inhaler 2 puffs into the lungs every six hours as needed for wheezing or shortness of breath and Prednisone 40mg by mouth per day for  7 days  HOME CARE . Only take medications as instructed by your medical team. . Consider wearing a mask or scarf to improve breathing air temperature have been shown to decrease irritation and decrease exacerbations . Get rest. . Taking a steamy shower or using a humidifier may help nasal congestion sand ease sore throat pain. You can place a towel over your head and breathe in the steam from hot water coming from a faucet. . Using a saline nasal spray works much the same way.  . Cough  drops, hare candies and sore throat lozenges may ease your cough.  . Avoid close contacts especially the very you and the elderly . Cover your mouth if you cough or sneeze . Always remember to wash your hands.    GET HELP RIGHT AWAY IF: . You develop worsening symptoms; breathlessness at rest, drowsy, confused or agitated, unable to speak in full sentences . You have coughing fits . You develop a severe headache or visual changes . You develop shortness of breath, difficulty breathing or start having chest pain . Your symptoms persist after you have completed your treatment plan . If your symptoms do not improve within 10 days  MAKE SURE YOU . Understand these instructions. . Will watch your condition. . Will get help right away if you are not doing well or get worse.   Your e-visit answers were reviewed by a board certified advanced clinical practitioner to complete your personal care plan, Depending upon the condition, your plan could have included both over the counter or prescription medications.  Please review your pharmacy choice. Your safety is important to us. If you have drug allergies check your prescription carefully. You can use MyChart to ask questions about today's visit, request a non-urgent call back, or ask for a work or school excuse for 24 hours related to this e-Visit. If it has been greater than 24 hours you will need to follow up with your provider, or enter a new e-Visit to address those concerns.  You will get an e-mail in the next two days asking about your experience. I hope that your e-visit has been valuable and will speed your recovery. Thank   you for using e-visits.   Approximately 5 minutes was spent documenting and reviewing patient's chart.   

## 2020-06-21 NOTE — Progress Notes (Signed)
Hello,   I have refiled your nebulizer solution. I have sent this to your pharmacy. I hope you feel better soon!   Evelina Dun, FNP

## 2020-06-24 ENCOUNTER — Telehealth: Payer: Self-pay

## 2020-06-24 NOTE — Telephone Encounter (Signed)
Patient sent a mychart message, however, the office was closed. Called patient to follow up on E-Visit. No answer. Lvm.

## 2020-07-02 ENCOUNTER — Ambulatory Visit (HOSPITAL_COMMUNITY): Payer: Medicare Other | Admitting: Psychiatry

## 2020-07-04 ENCOUNTER — Ambulatory Visit (INDEPENDENT_AMBULATORY_CARE_PROVIDER_SITE_OTHER): Payer: Medicare Other | Admitting: Psychiatry

## 2020-07-04 ENCOUNTER — Other Ambulatory Visit: Payer: Self-pay | Admitting: Family Medicine

## 2020-07-04 ENCOUNTER — Other Ambulatory Visit: Payer: Self-pay

## 2020-07-04 DIAGNOSIS — F411 Generalized anxiety disorder: Secondary | ICD-10-CM | POA: Diagnosis not present

## 2020-07-04 NOTE — Progress Notes (Signed)
Virtual Visit via Video Note  I connected with Janet Acevedo on 07/04/20 at 4:10 PM EST  by a video enabled telemedicine application and verified that I am speaking with the correct person using two identifiers.  Location: Patient: Home Provider: [place] office    I discussed the limitations of evaluation and management by telemedicine and the availability of in person appointments. The patient expressed understanding and agreed to proceed.  I provided 50 minutes of non-face-to-face time during this encounter.   [name], LCSW                  THERAPIST PROGRESS NOTE  Location:  Patient - home/ Provider - [place] office   Session Time:  Thursday 07/04/2020 4:10 PM - 5:00 PM Participation Level: Active  Behavioral Response: CasualAlertAnxious/sad  Type of Therapy: Individual Therapy      Treatment Goals addressed: Patient wants to improve coping skills for anxiety, sustain her happiness without being dependent on relationship with others, improve self-esteem/self-worth, learn to speak up for self learn and implement cognitive and behavioral strategies to cope with anxiety  Interventions: CBT and Supportive   Summary: Janet Acevedo is a 29 y.o. female who is referred for services by psychiatrist Dr. Modesta Messing due to patient experiencing symptoms of anxiety. She denies any psychiatric hospitalizations and no previous involvement in psychotherapy.  patient reports having uncontrollable anxiety and finding herself in an anxious state every day. She reports having anxiety since childhood but has worsened in the last 1-2 years. She reports stress regarding an aunt who is a quadriplegic as her health has fluctuated. Patient reports being very close with his aunt. She also reports dealing with her sexuality and says she has had a girlfriend secretly for the past 8 years. She says she used to be okay with this but now being tired of hiding it. She also  worries about her brother who is in prison and openly gay. Patient's reported symptoms of anxiety include "sweaty palms, hands tremble alot, voice is shakey, emotional outbursts of crying, increased heart rate".  Patient was last seen about 4 weeks ago by virtual visit.  She reports increased stress triggered by recently terminating the relationship with her partner.  Patient is pleased with her use of assertiveness skills and her efforts to set/maintain limits.  However, she expresses sadness and frustration as well as her regarding her partner's reaction as well as her negative behavior toward patient since the break-up.  Patient reports disturbances in eating and sleeping patterns.  She also reports nervousness but also reports having a sense of peace since the break-up.  Although she reports knowing the break-up was necessary, patient is experiencing grief and loss issues related to the termination of a 10-year relationship.  She is excited but also fearful regarding her future.  She is pleased with her recent disclosure to her mother about her sexuality and says this has had a very positive effect of the relationship.  She reports mother has been very supportive.  Patient reports she has been coping by journaling, listening to music, using deep breathing, and reconnecting with her spirituality.    Suicidal/Homicidal: Nowithout intent/plan  Therapist Response:  Reviewed symptoms, praised and reinforced patient's use of assertiveness skills/coping skills, discussed effects, reviewed ways to maintain limits, praised and reinforced patient's growth/strength/and courage, encouraged patient to continue using support system, developed plan with patient to do daily activity planning, reviewed rationale for using DBT skills (ACC E PTS)  Plan: Return again in 2 weeks.  Diagnosis: Axis I: Generalized Anxiety Disorder    Axis II: Deferred    [name], LCSW 07/04/2020

## 2020-07-09 DIAGNOSIS — G121 Other inherited spinal muscular atrophy: Secondary | ICD-10-CM | POA: Diagnosis not present

## 2020-07-09 DIAGNOSIS — Z881 Allergy status to other antibiotic agents status: Secondary | ICD-10-CM | POA: Diagnosis not present

## 2020-07-16 ENCOUNTER — Telehealth (HOSPITAL_COMMUNITY): Payer: Self-pay | Admitting: Psychiatry

## 2020-07-16 ENCOUNTER — Ambulatory Visit (HOSPITAL_COMMUNITY): Payer: Medicare Other | Admitting: Psychiatry

## 2020-07-16 ENCOUNTER — Other Ambulatory Visit: Payer: Self-pay

## 2020-07-16 NOTE — Telephone Encounter (Signed)
Therapist called patient for scheduled appointment.  However patient is not feeling well today.  Therapist and patient agreed to reschedule.

## 2020-07-25 ENCOUNTER — Other Ambulatory Visit: Payer: Self-pay

## 2020-07-25 ENCOUNTER — Telehealth: Payer: Self-pay | Admitting: *Deleted

## 2020-07-25 ENCOUNTER — Encounter: Payer: Self-pay | Admitting: Family Medicine

## 2020-07-25 ENCOUNTER — Telehealth: Payer: Medicare Other | Admitting: Physician Assistant

## 2020-07-25 DIAGNOSIS — J4540 Moderate persistent asthma, uncomplicated: Secondary | ICD-10-CM

## 2020-07-25 DIAGNOSIS — J029 Acute pharyngitis, unspecified: Secondary | ICD-10-CM

## 2020-07-25 MED ORDER — UNABLE TO FIND: 0 refills | Status: DC

## 2020-07-25 MED ORDER — NEBULIZER/TUBING/MOUTHPIECE KIT: PACK | 0 refills | Status: DC

## 2020-07-25 NOTE — Telephone Encounter (Signed)
Made a virtual appt with patel 07-30-19

## 2020-07-25 NOTE — Telephone Encounter (Signed)
Faxed to the pharmacy ([facility])

## 2020-07-25 NOTE — Progress Notes (Signed)
Hi Janet Acevedo,   I am sorry you aren't feeling well.  When someone has a sore throat that is significantly worse on one side, we need to be concerned about a possible abscess. I would feel more comfortable if a medical provider took a look in your throat and ears.   Based on what you shared with me, I feel your condition warrants further evaluation and I recommend that you be seen for a face to face office visit so you can have a physical exam.   NOTE: If you entered your credit card information for this eVisit, you will not be charged. You may see a "hold" on your card for the $35 but that hold will drop off and you will not have a charge processed.   If you are having a true medical emergency please call 911.      For an urgent face to face visit, [place] has five urgent care centers for your convenience:     [facility] at [facility] Directions [phone] [address] [place], [place] [address] . 10 am - 6pm Monday - Friday    [facility] Health Urgent [facility]) Get Driving Directions [phone] [address] [place], [place] [address] . 10 am to 8 pm Monday-Friday . 12 pm to 8 pm [facility] Urgent Care at [facility] Get Driving Directions [phone] [address] [place], [place] [address] . 8 am to 8 pm Monday-Friday . 9 am to 6 pm Saturday . 11 am to 6 pm Sunday     [facility] Urgent Care at [facility] Get Driving Directions  [phone] [address] [place], [place] [address] . 8 am to 8 pm Monday-Friday . 8 am to 4 pm [facility] Urgent Care at [facility] Directions [phone] [address] [place], [place] [address] . 12 pm to 6 pm Monday-Friday      Your e-visit answers were reviewed by a board certified advanced clinical practitioner to complete your personal care plan.  Thank you for using  e-Visits.

## 2020-07-25 NOTE — Telephone Encounter (Signed)
Pt was wondering if nebulizer machine had been sent in to pharmacy as she never heard back from them? Please advise

## 2020-07-28 ENCOUNTER — Telehealth: Payer: Medicare Other | Admitting: Family

## 2020-07-28 DIAGNOSIS — R6889 Other general symptoms and signs: Secondary | ICD-10-CM

## 2020-07-28 MED ORDER — ALBUTEROL SULFATE HFA 108 (90 BASE) MCG/ACT IN AERS: 2.0000 | INHALATION_SPRAY | Freq: Four times a day (QID) | RESPIRATORY_TRACT | 0 refills | Status: DC | PRN

## 2020-07-28 MED ORDER — PREDNISONE 10 MG (21) PO TBPK: ORAL_TABLET | ORAL | 0 refills | Status: DC

## 2020-07-28 MED ORDER — OSELTAMIVIR PHOSPHATE 75 MG PO CAPS: 75.0000 mg | ORAL_CAPSULE | Freq: Two times a day (BID) | ORAL | 0 refills | Status: DC

## 2020-07-28 NOTE — Progress Notes (Signed)
E visit for Flu like symptoms   We are sorry that you are not feeling well.  Here is how we plan to help! Based on what you have shared with me it looks like you may have a respiratory virus that may be influenza.  Influenza or "the flu" is   an infection caused by a respiratory virus. The flu virus is highly contagious and persons who did not receive their yearly flu vaccination may "catch" the flu from close contact.  We have anti-viral medications to treat the viruses that cause this infection. They are not a "cure" and only shorten the course of the infection. These prescriptions are most effective when they are given within the first 2 days of "flu" symptoms. Antiviral medication are indicated if you have a high risk of complications from the flu. You should  also consider an antiviral medication if you are in close contact with someone who is at risk. These medications can help patients avoid complications from the flu  but have side effects that you should know. Possible side effects from Tamiflu or oseltamivir include nausea, vomiting, diarrhea, dizziness, headaches, eye redness, sleep problems or other respiratory symptoms. You should not take Tamiflu if you have an allergy to oseltamivir or any to the ingredients in Tamiflu.  Based upon your symptoms and potential risk factors I have prescribed Oseltamivir (Tamiflu).  It has been sent to your designated pharmacy.  You will take one 75 mg capsule orally twice a day for the next 5 days. I have also sent in albuterol and prednisone dose pack.   ANYONE WHO HAS FLU SYMPTOMS SHOULD: . Stay home. The flu is highly contagious and going out or to work exposes others! . Be sure to drink plenty of fluids. Water is fine as well as fruit juices, sodas and electrolyte beverages. You may want to stay away from caffeine or alcohol. If you are nauseated, try taking small sips of liquids. How do you know if you are getting enough fluid? Your urine should be a  pale yellow or almost colorless. . Get rest. . Taking a steamy shower or using a humidifier may help nasal congestion and ease sore throat pain. Using a saline nasal spray works much the same way. . Cough drops, hard candies and sore throat lozenges may ease your cough. . Line up a caregiver. Have someone check on you regularly.   GET HELP RIGHT AWAY IF: . You cannot keep down liquids or your medications. . You become short of breath . Your fell like you are going to pass out or loose consciousness. . Your symptoms persist after you have completed your treatment plan MAKE SURE YOU   Understand these instructions.  Will watch your condition.  Will get help right away if you are not doing well or get worse.  Your e-visit answers were reviewed by a board certified advanced clinical practitioner to complete your personal care plan.  Depending on the condition, your plan could have included both over the counter or prescription medications.  If there is a problem please reply  once you have received a response from your provider.  Your safety is important to [place].  If you have drug allergies check your prescription carefully.    You can use MyChart to ask questions about today's visit, request a non-urgent call back, or ask for a work or school excuse for 24 hours related to this e-Visit. If it has been greater than 24 hours you will need  to follow up with your provider, or enter a new e-Visit to address those concerns.  You will get an e-mail in the next two days asking about your experience.  I hope that your e-visit has been valuable and will speed your recovery. Thank you for using e-visits.  Approximately 5 minutes was spent documenting and reviewing patient's chart.

## 2020-07-29 ENCOUNTER — Other Ambulatory Visit: Payer: Self-pay

## 2020-07-29 ENCOUNTER — Encounter: Payer: Self-pay | Admitting: Internal Medicine

## 2020-07-29 ENCOUNTER — Telehealth (INDEPENDENT_AMBULATORY_CARE_PROVIDER_SITE_OTHER): Payer: Medicare Other | Admitting: Internal Medicine

## 2020-07-29 DIAGNOSIS — Z7189 Other specified counseling: Secondary | ICD-10-CM

## 2020-07-29 DIAGNOSIS — U071 COVID-19: Secondary | ICD-10-CM

## 2020-07-29 NOTE — Progress Notes (Signed)
Virtual Visit via Telephone Note   This visit type was conducted due to national recommendations for restrictions regarding the COVID-19 Pandemic (e.g. social distancing) in an effort to limit this patient's exposure and mitigate transmission in our community.  Due to her co-morbid illnesses, this patient is at least at moderate risk for complications without adequate follow up.  This format is felt to be most appropriate for this patient at this time.  The patient did not have access to video technology/had technical difficulties with video requiring transitioning to audio format only (telephone).  All issues noted in this document were discussed and addressed.  No physical exam could be performed with this format.  Evaluation Performed:  Follow-up visit  Date:  07/29/2020   ID:  MARGARETTE VANNATTER, DOB [birth year], MRN [id number]  Patient Location: Home Provider Location: Office/Clinic  Location of Patient: Home Location of Provider: Telehealth Consent was obtain for visit to be over via telehealth. I verified that I am speaking with the correct person using two identifiers.  PCP:  Fayrene Helper, MD   Chief Complaint:  COVID positive  History of Present Illness:    Janet Acevedo is a 30 y.o. female with past medical history of asthma who has a televisit for complaint of nasal congestion, cough, sore throat along with myalgias and chills, which started about 4 to 5 days ago. Patient reports exposure to Covid positive person.  She tested positive for Covid at home today.  She denies any fever, nausea, vomiting, excessive dyspnea, chest pain or palpitations.  Of note, patient has a history of asthma and uses Symbicort and albuterol as needed for dyspnea/wheezing.  She denies any excessive dyspnea/wheezing.  Patient had evisit yesterday, and was prescribed Tamiflu and prednisone taper.  The patient does have symptoms concerning for COVID-19 infection (fever, chills, cough, or new  shortness of breath).   Past Medical, Surgical, Social History, Allergies, and Medications have been Reviewed.  Past Medical History:  Diagnosis Date  . Allergy   . Anxiety   . Asthma   . Depression, major, single episode, moderate ([place]) 09/05/2017  . GERD (gastroesophageal reflux disease)   . Neuromuscular disorder ([place])   . Osteomyelitis [facility]) June 2011  . Otitis externa, eczematoid 05/28/2019  . Perennial allergic rhinitis   . Seborrheic dermatitis of scalp 2006  . Spinal muscle atrophy ([place])    type 2/3 18 months   Past Surgical History:  Procedure Laterality Date  . bilateral hip reinsertion  under age 35   hips out of socket, pt was only able to cruise hold on and move short distances   . BONE BIOPSY     spinal bone   . MIRENA PLACED 10/27/10    . SPINAL FUSION  1999   rods placed in thoracolumbar spine to excessive scoliosis primarily   . SPINE SURGERY N/A    [employer] 07/29/2020     Current Meds  Medication Sig  . albuterol (ACCUNEB) 1.25 MG/3ML nebulizer solution Take 3 mLs (1.25 mg total) by nebulization every 4 (four) hours as needed. Dx J45.40  . albuterol (PROVENTIL) (2.5 MG/3ML) 0.083% nebulizer solution Take 3 mLs (2.5 mg total) by nebulization every 6 (six) hours as needed for wheezing or shortness of breath.  [name] albuterol (VENTOLIN HFA) 108 (90 Base) MCG/ACT inhaler Inhale 2 puffs into the lungs every 6 (six) hours as needed for wheezing or shortness of breath.  [name] azelastine (ASTELIN) 0.1 % nasal spray Place 2 sprays  into both nostrils 2 (two) times daily. Use in each nostril as directed  . betamethasone dipropionate (DIPROLENE) 0.05 % cream APPLY TOPICALLY 2 (TWO) TIMES DAILY.  [name] Biotin 10 MG TABS Take 1 tablet by mouth daily.  . busPIRone (BUSPAR) 7.5 MG tablet TAKE 1 TABLET BY MOUTH 2 TIMES DAILY.  . cromolyn (OPTICROM) 4 % ophthalmic solution PLACE 1 DROP INTO BOTH EYES FOUR TIMES DAILY  . Fluocinolone Acetonide 0.01 % OIL Apply sparingly to scalp two times   Weekly, as needed, for itch and flaking  . Fluocinolone Acetonide Scalp 0.01 % OIL Apply 1 application topically as directed.  [name] ketoconazole (NIZORAL) 2 % cream APPLY 1 APPLICATION TOPICALLY AS NEEDED FOR DERMATITIS  . ketoconazole (NIZORAL) 2 % shampoo APPLY TO SCALP FOR 5 MINUTES AND RINSE OUT  . levocetirizine (XYZAL) 5 MG tablet TAKE 1 TABLET BY MOUTH EVERY DAY IN THE EVENING  . montelukast (SINGULAIR) 10 MG tablet TAKE 1 TABLET BY MOUTH EVERY DAY  . nystatin (MYCOSTATIN/NYSTOP) powder APPLY TO AFFECTED AREA TWICE A DAY AS NEEDED  . omeprazole (PRILOSEC) 20 MG capsule THE PATIENT HAS TROUBLE SWALLOWING. PLEASE TAKE 2 CAPSULES TWICE A DAY BY MOUTH  . ondansetron (ZOFRAN) 4 MG tablet Take one tablet by mouth once daily , as needed, for nausea  . oseltamivir (TAMIFLU) 75 MG capsule Take 1 capsule (75 mg total) by mouth 2 (two) times daily.  . predniSONE (STERAPRED UNI-PAK 21 TAB) 10 MG (21) TBPK tablet Use as directed  . Respiratory Therapy Supplies (NEBULIZER/ADULT MASK) KIT Insurance preference  . Respiratory Therapy Supplies (NEBULIZER/TUBING/MOUTHPIECE) KIT Use as directed. Insurance preference. Nebulizer kit and mask  . sertraline (ZOLOFT) 50 MG tablet TAKE 1 TABLET BY MOUTH EVERY DAY  . Spacer/Aero-Holding Chambers (AEROCHAMBER PLUS) inhaler Use as instructed  . SYMBICORT 160-4.5 MCG/ACT inhaler TAKE 2 PUFFS BY MOUTH TWICE A DAY  . UNABLE TO FIND Gloves- 1 box per month as needed  . UNABLE TO FIND Under pads- for use daily as needed  . UNABLE TO FIND Incontinence liners- use as needed for incontinence Gloves- 1 box as needed for incontinence DX urinary incontinence  . UNABLE TO FIND Nebulizer machine and tubing  DX J45.40     Allergies:   Other and Ciprofloxacin   ROS:   Please see the history of present illness.     All other systems reviewed and are negative.   Labs/Other Tests and Data Reviewed:    Recent Labs: No results found for requested labs within last 8760 hours.    Recent Lipid Panel Lab Results  Component Value Date/Time   CHOL 197 10/06/2018 01:53 PM   TRIG 53 10/06/2018 01:53 PM   HDL 43 (L) 10/06/2018 01:53 PM   CHOLHDL 4.6 10/06/2018 01:53 PM   LDLCALC 140 (H) 10/06/2018 01:53 PM    Wt Readings from Last 3 Encounters:  02/23/20 152 lb (68.9 kg)  01/22/20 152 lb (68.9 kg)  01/03/20 152 lb (68.9 kg)     ASSESSMENT & PLAN:    COVID-19 infection Advised to self quarantine for at least 10 days or until 24 hours afebrile, whichever is later Tylenol as needed Nasal saline spray as needed for nasal congestion Symbicort and albuterol as needed for dyspnea/wheezing Continue prednisone taper  Time:   Today, I have spent 15 minutes reviewing the chart, including problem list, medications, and with the patient with telehealth technology discussing the above problems.   Medication Adjustments/Labs and Tests Ordered: Current medicines are reviewed at  length with the patient today.  Concerns regarding medicines are outlined above.   Tests Ordered: No orders of the defined types were placed in this encounter.   Medication Changes: No orders of the defined types were placed in this encounter.    Note: This dictation was prepared with Dragon dictation along with smaller phrase technology. Similar sounding words can be transcribed inadequately or may not be corrected upon review. Any transcriptional errors that result from this process are unintentional.      Disposition:  Follow up  Signed, Lindell Spar, MD  07/29/2020 2:30 PM     [place]

## 2020-07-29 NOTE — Patient Instructions (Signed)
Please continue to self-quarantine for at least 10 days from today or until you are afebrile for at least 24 hours, whichever is later.  Okay to take Tylenol up to 3 times in a day for fever/chills/fatigue.  Please continue to use Symbicort and Albuterol (as needed) for shortness of breath/wheezing.  Okay to use nasal saline spray for nasal congestion.  Please continue to take steroids as prescribed.

## 2020-07-30 ENCOUNTER — Other Ambulatory Visit: Payer: Self-pay | Admitting: Family Medicine

## 2020-07-30 ENCOUNTER — Other Ambulatory Visit: Payer: Self-pay

## 2020-07-30 ENCOUNTER — Ambulatory Visit (HOSPITAL_COMMUNITY): Payer: Medicare Other | Admitting: Psychiatry

## 2020-08-11 ENCOUNTER — Other Ambulatory Visit: Payer: Self-pay | Admitting: Family Medicine

## 2020-08-13 ENCOUNTER — Ambulatory Visit (HOSPITAL_COMMUNITY): Payer: Medicaid Other | Admitting: Psychiatry

## 2020-08-13 ENCOUNTER — Other Ambulatory Visit: Payer: Self-pay

## 2020-08-13 ENCOUNTER — Ambulatory Visit (HOSPITAL_COMMUNITY): Payer: Medicare Other | Admitting: Psychiatry

## 2020-08-27 ENCOUNTER — Ambulatory Visit (HOSPITAL_COMMUNITY): Payer: Medicaid Other | Admitting: Psychiatry

## 2020-09-03 ENCOUNTER — Other Ambulatory Visit: Payer: Self-pay | Admitting: Family Medicine

## 2020-09-10 ENCOUNTER — Other Ambulatory Visit: Payer: Self-pay

## 2020-09-10 ENCOUNTER — Ambulatory Visit (INDEPENDENT_AMBULATORY_CARE_PROVIDER_SITE_OTHER): Payer: Medicare Other | Admitting: Psychiatry

## 2020-09-10 DIAGNOSIS — F411 Generalized anxiety disorder: Secondary | ICD-10-CM

## 2020-09-10 NOTE — Progress Notes (Signed)
Virtual Visit via Video Note  I connected with Janet Acevedo on 09/10/20 at 2:15 PM EST  by a video enabled telemedicine application and verified that I am speaking with the correct person using two identifiers.  Location: Patient: Home Provider: [place] office    I discussed the limitations of evaluation and management by telemedicine and the availability of in person appointments. The patient expressed understanding and agreed to proceed.   I provided 40 minutes of non-face-to-face time during this encounter.   [name], LCSW                  THERAPIST PROGRESS NOTE     Session Time:  Tuesday 09/10/2020 2;15 PM - 2:55 PM  Participation Level: Active  Behavioral Response: CasualAlertAnxious/euthymic  Type of Therapy: Individual Therapy      Treatment Goals addressed: Patient wants to improve coping skills for anxiety, sustain her happiness without being dependent on relationship with others, improve self-esteem/self-worth, learn to speak up for self learn and implement cognitive and behavioral strategies to cope with anxiety  Interventions: CBT and Supportive   Summary: Janet Acevedo is a 30 y.o. female who is referred for services by psychiatrist Dr. Modesta Messing due to patient experiencing symptoms of anxiety. She denies any psychiatric hospitalizations and no previous involvement in psychotherapy.  patient reports having uncontrollable anxiety and finding herself in an anxious state every day. She reports having anxiety since childhood but has worsened in the last 1-2 years. She reports stress regarding an aunt who is a quadriplegic as her health has fluctuated. Patient reports being very close with his aunt. She also reports dealing with her sexuality and says she has had a girlfriend secretly for the past 8 years. She says she used to be okay with this but now being tired of hiding it. She also worries about her brother who is in prison and openly gay.  Patient's reported symptoms of anxiety include "sweaty palms, hands tremble alot, voice is shakey, emotional outbursts of crying, increased heart rate".  Patient was last seen about 2 months ago by virtual visit.  She reports decreased symptoms of anxiety and depression since last session.  She is pleased with her efforts in coping with the break-up with her partner.  She reports using ACCEPTS discussed in last session and says this has been very helpful.  She has been enjoying engaging in activities with her mother such as cooking and reports mother remains very supportive.  She has continued to maintain limits with her former partner.  They have had contact and patient reports being assertive and setting limits regarding their contact.  Patient states feeling empowered and being the happiest she has been in a long time.  She reports she has been initiating conversations with other people and has developed 2 new friendships.  However, she is experiencing some anxiety about the relationships and also has trust issues as a result of the emotional trauma in her previous relationship.   Suicidal/Homicidal: Nowithout intent/plan  Therapist Response:  Reviewed symptoms, administered PHQ-9/C-SSRS/GAD-7, praised and reinforced patient's use of assertiveness skills/coping skills/increased behavioral activation, assisted patient identify anxiety provoking thoughts about her relationships, reviewed the connection between thoughts/mood/behavior, developed plan with patient to resume use of thought log, will discuss more next session   Plan: Return again in 2 weeks.  Diagnosis: Axis I: Generalized Anxiety Disorder    Axis II: Deferred    [name], LCSW 09/10/2020

## 2020-09-26 ENCOUNTER — Other Ambulatory Visit: Payer: Self-pay

## 2020-09-26 ENCOUNTER — Ambulatory Visit (INDEPENDENT_AMBULATORY_CARE_PROVIDER_SITE_OTHER): Payer: Medicare Other | Admitting: Psychiatry

## 2020-09-26 DIAGNOSIS — F411 Generalized anxiety disorder: Secondary | ICD-10-CM

## 2020-09-26 NOTE — Progress Notes (Signed)
Virtual Visit via Video Note  I connected with Janet Acevedo on 09/26/20 at 2:08 PM EST  by a video enabled telemedicine application and verified that I am speaking with the correct person using two identifiers.  Location: Patient: Home Provider: [place] office    I discussed the limitations of evaluation and management by telemedicine and the availability of in person appointments. The patient expressed understanding and agreed to proceed.  I provided 47  minutes of non-face-to-face time during this encounter.   [name], LCSW                   THERAPIST PROGRESS NOTE     Session Time:  Thursday 09/26/2020  2:08 PM - 2:55 PM   Participation Level: Active  Behavioral Response: CasualAlertAnxious/euthymic  Type of Therapy: Individual Therapy      Treatment Goals addressed: Patient wants to improve coping skills for anxiety, sustain her happiness without being dependent on relationship with others, improve self-esteem/self-worth, learn to speak up for self learn and implement cognitive and behavioral strategies to cope with anxiety  Interventions: CBT and Supportive   Summary: Janet Acevedo is a 30 y.o. female who is referred for services by psychiatrist Dr. Modesta Messing due to patient experiencing symptoms of anxiety. She denies any psychiatric hospitalizations and no previous involvement in psychotherapy.  patient reports having uncontrollable anxiety and finding herself in an anxious state every day. She reports having anxiety since childhood but has worsened in the last 1-2 years. She reports stress regarding an aunt who is a quadriplegic as her health has fluctuated. Patient reports being very close with his aunt. She also reports dealing with her sexuality and says she has had a girlfriend secretly for the past 8 years. She says she used to be okay with this but now being tired of hiding it. She also worries about her brother who is in prison and openly gay.  Patient's reported symptoms of anxiety include "sweaty palms, hands tremble alot, voice is shakey, emotional outbursts of crying, increased heart rate".  Patient was last seen about 2 weeks ago by virtual visit.  She reports minimal symptoms of depression and decreased anxiety since last session.  She has continued to set and maintain boundaries with her former partner.  However, she expresses some frustration and anxiety about former partner continuing to have contact with patient's mother, she reports often becoming anxious and nervous when hearing her mother talk on the phone to her former partner and when mother shares information with her about her former partner.  Patient is pleased she has improved self-care and is trying to self nurture.  She reports starting to buy things for herself like jewelry and clothing.  She is beginning to verbalize more positive statements about self but still can be hard on self per her report. Suicidal/Homicidal: Nowithout intent/plan  Therapist Response:  Reviewed symptoms, praised and reinforced patient's efforts to self nurture and improved self-care, discussed effects, discussed stressors, facilitated expression of thoughts and feelings, validated feelings, assisted patient identify ways to improve assertiveness skills by expressing her concerns to her mother and setting limits with her mother about mother's conversation with patient about her former partner, developed plan with patient to use strategies discussed in session to express concerns to mother, normalized feelings and thoughts associated with emotional trauma, discussed strategies to use including grounding techniques, develop plan with patient to read handout on grounding techniques and to practice, discussed self compassion, gust rationale for  and provided instructions on how to use compassionate thought challenging record to help increase self compassion, developed plan with patient to complete record and  bring to next session   Plan: Return again in 2 weeks.  Diagnosis: Axis I: Generalized Anxiety Disorder    Axis II: Deferred    [name], LCSW 09/26/2020

## 2020-09-30 ENCOUNTER — Other Ambulatory Visit: Payer: Self-pay | Admitting: Family Medicine

## 2020-10-10 ENCOUNTER — Ambulatory Visit (HOSPITAL_COMMUNITY): Payer: Medicaid Other | Admitting: Psychiatry

## 2020-10-17 DIAGNOSIS — G121 Other inherited spinal muscular atrophy: Secondary | ICD-10-CM | POA: Diagnosis not present

## 2020-10-24 ENCOUNTER — Ambulatory Visit (INDEPENDENT_AMBULATORY_CARE_PROVIDER_SITE_OTHER): Payer: Medicare Other | Admitting: Psychiatry

## 2020-10-24 ENCOUNTER — Other Ambulatory Visit: Payer: Self-pay

## 2020-10-24 DIAGNOSIS — F411 Generalized anxiety disorder: Secondary | ICD-10-CM | POA: Diagnosis not present

## 2020-10-24 NOTE — Progress Notes (Signed)
Virtual Visit via Video Note  I connected with Janet Acevedo on 10/24/20 at 2:15 PM EDT by a video enabled telemedicine application and verified that I am speaking with the correct person using two identifiers.  Location: Patient: Home Provider: [place] office    I discussed the limitations of evaluation and management by telemedicine and the availability of in person appointments. The patient expressed understanding and agreed to proceed.  I provided 40  minutes of non-face-to-face time during this encounter.   [name], LCSW                  THERAPIST PROGRESS NOTE     Session Time:  Thursday 10/24/2020  2:15 PM -2:55 PM   Participation Level: Active  Behavioral Response: CasualAlertAnxious/euthymic  Type of Therapy: Individual Therapy      Treatment Goals addressed: Patient wants to improve coping skills for anxiety, sustain her happiness without being dependent on relationship with others, improve self-esteem/self-worth, learn to speak up for self learn and implement cognitive and behavioral strategies to cope with anxiety  Interventions: CBT and Supportive   Summary: Janet Acevedo is a 30 y.o. female who is referred for services by psychiatrist Dr. Modesta Messing due to patient experiencing symptoms of anxiety. She denies any psychiatric hospitalizations and no previous involvement in psychotherapy.  patient reports having uncontrollable anxiety and finding herself in an anxious state every day. She reports having anxiety since childhood but has worsened in the last 1-2 years. She reports stress regarding an aunt who is a quadriplegic as her health has fluctuated. Patient reports being very close with his aunt. She also reports dealing with her sexuality and says she has had a girlfriend secretly for the past 8 years. She says she used to be okay with this but now being tired of hiding it. She also worries about her brother who is in prison and openly gay.  Patient's reported symptoms of anxiety include "sweaty palms, hands tremble alot, voice is shakey, emotional outbursts of crying, increased heart rate".  Patient was last seen about 3-4 weeks ago by virtual visit.  She reports minimal symptoms of depression and states anxiety level has been stable since last session.  She says she has been happy and excited to get up in the mornings.  She continues to improve self-care and self nurture.  She did not use the formal compassionate thought record but she has been replacing self-deprecating thoughts with positive thoughts about self.  She reports a morning ritual of identifying 3 things she is thankful for and identifying 3 things she loves about herself.  She states feeling empowered and learning to embrace all of self.  She states feeling more at peace.  Patient also is much more hopeful about the future.  She is excited she only has 1 more step to complete for approval for new drug to treat spinal muscular atrophy and hopes this will slow the progression as well as improve her mobility.  She continues to engage in positive relationships.  She is pleased she had assertive conversation with mother to express her concerns to mother about her contact with patient's former partner.  Per her report, mother was very supportive.  Patient now expresses concern and anxiety her former partner still has items at patient's home and has not kept promises to pick up the items.  Patient still reports feeling nervous and jittery when mother talks with her former partner.     Suicidal/Homicidal: Nowithout intent/plan  Therapist Response:  Reviewed symptoms, praised and reinforced patient's efforts to self nurture and improved self-care/use of compassionate statements and gratitude list/use of assertiveness skills, discussed effects, discussed stressors, facilitated expression of thoughts and feelings, validated feelings, reviewed basic personal rights to promote effective  assertion, assisted patient identify ways to work with mother to set and maintaining limits with former partner regarding picking up items    Plan: Return again in 2 weeks.  Diagnosis: Axis I: Generalized Anxiety Disorder    Axis II: Deferred    [name], LCSW 10/24/2020

## 2020-11-02 ENCOUNTER — Other Ambulatory Visit: Payer: Self-pay | Admitting: Family Medicine

## 2020-11-07 ENCOUNTER — Ambulatory Visit (HOSPITAL_COMMUNITY): Payer: Medicaid Other | Admitting: Psychiatry

## 2020-11-21 ENCOUNTER — Ambulatory Visit (HOSPITAL_COMMUNITY): Payer: Medicaid Other | Admitting: Psychiatry

## 2020-11-21 DIAGNOSIS — J984 Other disorders of lung: Secondary | ICD-10-CM | POA: Diagnosis not present

## 2020-11-25 DIAGNOSIS — J984 Other disorders of lung: Secondary | ICD-10-CM | POA: Diagnosis not present

## 2020-12-05 ENCOUNTER — Ambulatory Visit (INDEPENDENT_AMBULATORY_CARE_PROVIDER_SITE_OTHER): Payer: Medicare Other | Admitting: Psychiatry

## 2020-12-05 ENCOUNTER — Other Ambulatory Visit: Payer: Self-pay

## 2020-12-05 DIAGNOSIS — F411 Generalized anxiety disorder: Secondary | ICD-10-CM | POA: Diagnosis not present
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
## 2020-12-17 ENCOUNTER — Ambulatory Visit (HOSPITAL_COMMUNITY): Payer: Medicare Other | Admitting: Psychiatry

## 2020-12-26 ENCOUNTER — Other Ambulatory Visit: Payer: Self-pay | Admitting: Family Medicine

## 2020-12-31 ENCOUNTER — Ambulatory Visit (INDEPENDENT_AMBULATORY_CARE_PROVIDER_SITE_OTHER): Payer: Medicare Other | Admitting: Psychiatry

## 2020-12-31 ENCOUNTER — Other Ambulatory Visit: Payer: Self-pay

## 2020-12-31 DIAGNOSIS — F411 Generalized anxiety disorder: Secondary | ICD-10-CM | POA: Diagnosis not present
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
## 2021-01-14 ENCOUNTER — Other Ambulatory Visit: Payer: Self-pay

## 2021-01-14 ENCOUNTER — Ambulatory Visit (INDEPENDENT_AMBULATORY_CARE_PROVIDER_SITE_OTHER): Payer: Medicare Other | Admitting: Psychiatry

## 2021-01-14 DIAGNOSIS — F411 Generalized anxiety disorder: Secondary | ICD-10-CM

## 2021-01-14 NOTE — Progress Notes (Signed)
Virtual Visit via Video Note  I connected with Janet Acevedo on 01/14/21 at  2:00 PM EDT by a video enabled telemedicine application and verified that I am speaking with the correct person using two identifiers.  Location: Patient: Home Provider: [place] office    I discussed the limitations of evaluation and management by telemedicine and the availability of in person appointments. The patient expressed understanding and agreed to proceed.  I provided 50 minutes of non-face-to-face time during this encounter.   [name], LCSW                  THERAPIST PROGRESS NOTE     Session Time:  Tuesday 01/14/2021 2:00 PM - 2:50 PM   Participation Level: Active  Behavioral Response: CasualAlertAnxious  Type of Therapy: Individual Therapy      Treatment Goals addressed: Patient wants to improve coping skills for anxiety, sustain her happiness without being dependent on relationship with others, improve self-esteem/self-worth, learn to speak up for self learn and implement cognitive and behavioral strategies to cope with anxiety  Interventions: CBT and Supportive   Summary: Janet Acevedo is a 30 y.o. female who is referred for services by psychiatrist Dr. Modesta Messing due to patient experiencing symptoms of anxiety. She denies any psychiatric hospitalizations and no previous involvement in psychotherapy.  patient reports having uncontrollable anxiety and finding herself in an anxious state every day. She reports having anxiety since childhood but has worsened in the last 1-2 years. She reports stress regarding an aunt who is a quadriplegic as her health has fluctuated. Patient reports being very close with his aunt. She also reports dealing with her sexuality and says she has had a girlfriend secretly for the past 8 years. She says she used to be okay with this but now being tired of hiding it. She also worries about her brother who is in prison and openly gay. Patient's  reported symptoms of anxiety include "sweaty palms, hands tremble alot, voice is shakey, emotional outbursts of crying, increased heart rate".  Patient was last seen about 2 weeks ago.  She reports minimal symptoms of depression but continued stress and anxiety.  Patient reports restlessness, excessive worry, fatigue, and difficulty staying asleep.  Patient states feeling overwhelmed.  She continues to worry about finances as she and her mother continue to try to assist her sister.  Patient expresses frustration as she and mother have different opinions about asking for help regarding assisting patient's sister.  She also reports feeling overwhelmed by various family members constantly asking her to do tasks they could do for themselves.  Patient reports this has been going on for years but states just now really becoming aware of the pattern of interaction between her and her family.  She wants to say no and set limits but fears people will be upset or angry with her.  She states being a people pleaser.  She expresses anger and resentment.    Suicidal/Homicidal: Nowithout intent/plan  Therapist Response:  Reviewed symptoms, discussed stressors, facilitated expression of thoughts and feelings, validated feelings, assisted patient examine her pattern of interaction with family, began to assist patient examine her thought patterns that inhibit her ability to use assertiveness skills, began to discuss next steps for treatment and we will set priorities at next session, also will send patient basic personal rights in preparation for next session    Plan: Return again in 2 weeks.  Diagnosis: Axis I: Generalized Anxiety Disorder  Axis II: Deferred    [name], LCSW 01/14/2021

## 2021-01-18 ENCOUNTER — Other Ambulatory Visit: Payer: Self-pay | Admitting: Family Medicine

## 2021-01-22 ENCOUNTER — Ambulatory Visit (INDEPENDENT_AMBULATORY_CARE_PROVIDER_SITE_OTHER): Payer: Medicare Other

## 2021-01-22 ENCOUNTER — Telehealth: Payer: Self-pay

## 2021-01-22 ENCOUNTER — Other Ambulatory Visit: Payer: Self-pay

## 2021-01-22 DIAGNOSIS — Z0000 Encounter for general adult medical examination without abnormal findings: Secondary | ICD-10-CM | POA: Diagnosis not present

## 2021-01-22 NOTE — Patient Instructions (Signed)
Janet Acevedo , Thank you for taking time to come for your Medicare Wellness Visit. I appreciate your ongoing commitment to your health goals. Please review the following plan we discussed and let me know if I can assist you in the future.   Screening recommendations/referrals: Colonoscopy: Not eligible at this time.  Mammogram: Not eligible at this time.  Bone Density: Not eligible   Recommended yearly ophthalmology/optometry visit for glaucoma screening and checkup Recommended yearly dental visit for hygiene and checkup  Vaccinations: Influenza vaccine: Up to date, recommended this fall 2022.  Pneumococcal vaccine: Up to date, has had Prevnar 44. Tdap vaccine: Up to date, not due until 2027  Shingles vaccine: Not eligible at this time.     Advanced directives: Not at this time.   Conditions/risks identified: Pt experiencing leg pain due to possible ingrown hair, plans to have phone visit and send picture through MyChart for evaluation and treatment.   Next appointment: Follow up visit 02/25/21 @ 1:40pm with Dr. Moshe Cipro please have your fasting blood work drawn in our office (no appointment necessary) prior to this appt, the lab opens at [place] 29 Years and Older, Female Preventive care refers to lifestyle choices and visits with your health care provider that can promote health and wellness. What does preventive care include? A yearly physical exam. This is also called an annual well check. Dental exams once or twice a year. Routine eye exams. Ask your health care provider how often you should have your eyes checked. Personal lifestyle choices, including: Daily care of your teeth and gums. Regular physical activity. Eating a healthy diet. Avoiding tobacco and drug use. Limiting alcohol use. Practicing safe sex. Taking low-dose aspirin every day. Taking vitamin and mineral supplements as recommended by your health care provider. What happens during an annual well  check? The services and screenings done by your health care provider during your annual well check will depend on your age, overall health, lifestyle risk factors, and family history of disease. Counseling  Your health care provider may ask you questions about your: Alcohol use. Tobacco use. Drug use. Emotional well-being. Home and relationship well-being. Sexual activity. Eating habits. History of falls. Memory and ability to understand (cognition). Work and work Statistician. Reproductive health. Screening  You may have the following tests or measurements: Height, weight, and BMI. Blood pressure. Lipid and cholesterol levels. These may be checked every 5 years, or more frequently if you are over 70 years old. Skin check. Lung cancer screening. You may have this screening every year starting at age 66 if you have a 30-pack-year history of smoking and currently smoke or have quit within the past 15 years. Fecal occult blood test (FOBT) of the stool. You may have this test every year starting at age 30. Flexible sigmoidoscopy or colonoscopy. You may have a sigmoidoscopy every 5 years or a colonoscopy every 10 years starting at age 74. Hepatitis C blood test. Hepatitis B blood test. Sexually transmitted disease (STD) testing. Diabetes screening. This is done by checking your blood sugar (glucose) after you have not eaten for a while (fasting). You may have this done every 1-3 years. Bone density scan. This is done to screen for osteoporosis. You may have this done starting at age 59. Mammogram. This may be done every 1-2 years. Talk to your health care provider about how often you should have regular mammograms. Talk with your health care provider about your test results, treatment options, and if necessary,  the need for more tests. Vaccines  Your health care provider may recommend certain vaccines, such as: Influenza vaccine. This is recommended every year. Tetanus, diphtheria, and  acellular pertussis (Tdap, Td) vaccine. You may need a Td booster every 10 years. Zoster vaccine. You may need this after age 70. Pneumococcal 13-valent conjugate (PCV13) vaccine. One dose is recommended after age 15. Pneumococcal polysaccharide (PPSV23) vaccine. One dose is recommended after age 22. Talk to your health care provider about which screenings and vaccines you need and how often you need them. This information is not intended to replace advice given to you by your health care provider. Make sure you discuss any questions you have with your health care provider. Document Released: 08/09/2015 Document Revised: 04/01/2016 Document Reviewed: 05/14/2015 Elsevier Interactive Patient Education  2017 [place] Prevention in the Home Falls can cause injuries. They can happen to people of all ages. There are many things you can do to make your home safe and to help prevent falls. What can I do on the outside of my home? Regularly fix the edges of walkways and driveways and fix any cracks. Remove anything that might make you trip as you walk through a door, such as a raised step or threshold. Trim any bushes or trees on the path to your home. Use bright outdoor lighting. Clear any walking paths of anything that might make someone trip, such as rocks or tools. Regularly check to see if handrails are loose or broken. Make sure that both sides of any steps have handrails. Any raised decks and porches should have guardrails on the edges. Have any leaves, snow, or ice cleared regularly. Use sand or salt on walking paths during winter. Clean up any spills in your garage right away. This includes oil or grease spills. What can I do in the bathroom? Use night lights. Install grab bars by the toilet and in the tub and shower. Do not use towel bars as grab bars. Use non-skid mats or decals in the tub or shower. If you need to sit down in the shower, use a plastic, non-slip stool. Keep  the floor dry. Clean up any water that spills on the floor as soon as it happens. Remove soap buildup in the tub or shower regularly. Attach bath mats securely with double-sided non-slip rug tape. Do not have throw rugs and other things on the floor that can make you trip. What can I do in the bedroom? Use night lights. Make sure that you have a light by your bed that is easy to reach. Do not use any sheets or blankets that are too big for your bed. They should not hang down onto the floor. Have a firm chair that has side arms. You can use this for support while you get dressed. Do not have throw rugs and other things on the floor that can make you trip. What can I do in the kitchen? Clean up any spills right away. Avoid walking on wet floors. Keep items that you use a lot in easy-to-reach places. If you need to reach something above you, use a strong step stool that has a grab bar. Keep electrical cords out of the way. Do not use floor polish or wax that makes floors slippery. If you must use wax, use non-skid floor wax. Do not have throw rugs and other things on the floor that can make you trip. What can I do with my stairs? Do not leave any items on  the stairs. Make sure that there are handrails on both sides of the stairs and use them. Fix handrails that are broken or loose. Make sure that handrails are as long as the stairways. Check any carpeting to make sure that it is firmly attached to the stairs. Fix any carpet that is loose or worn. Avoid having throw rugs at the top or bottom of the stairs. If you do have throw rugs, attach them to the floor with carpet tape. Make sure that you have a light switch at the top of the stairs and the bottom of the stairs. If you do not have them, ask someone to add them for you. What else can I do to help prevent falls? Wear shoes that: Do not have high heels. Have rubber bottoms. Are comfortable and fit you well. Are closed at the toe. Do not  wear sandals. If you use a stepladder: Make sure that it is fully opened. Do not climb a closed stepladder. Make sure that both sides of the stepladder are locked into place. Ask someone to hold it for you, if possible. Clearly mark and make sure that you can see: Any grab bars or handrails. First and last steps. Where the edge of each step is. Use tools that help you move around (mobility aids) if they are needed. These include: Canes. Walkers. Scooters. Crutches. Turn on the lights when you go into a dark area. Replace any light bulbs as soon as they burn out. Set up your furniture so you have a clear path. Avoid moving your furniture around. If any of your floors are uneven, fix them. If there are any pets around you, be aware of where they are. Review your medicines with your doctor. Some medicines can make you feel dizzy. This can increase your chance of falling. Ask your doctor what other things that you can do to help prevent falls. This information is not intended to replace advice given to you by your health care provider. Make sure you discuss any questions you have with your health care provider. Document Released: 05/09/2009 Document Revised: 12/19/2015 Document Reviewed: 08/17/2014 Elsevier Interactive Patient Education  2017 [employer].

## 2021-01-22 NOTE — Progress Notes (Addendum)
Subjective:   Janet Acevedo is a 30 y.o. female who presents for Medicare Annual (Subsequent) preventive examination.        Objective:    There were no vitals filed for this visit. There is no height or weight on file to calculate BMI.  Advanced Directives 01/12/2017  Does Patient Have a Medical Advance Directive? No    Current Medications (verified) Outpatient Encounter Medications as of 01/22/2021  Medication Sig   albuterol (ACCUNEB) 1.25 MG/3ML nebulizer solution Take 3 mLs (1.25 mg total) by nebulization every 4 (four) hours as needed. Dx J45.40   albuterol (PROVENTIL) (2.5 MG/3ML) 0.083% nebulizer solution Take 3 mLs (2.5 mg total) by nebulization every 6 (six) hours as needed for wheezing or shortness of breath.   albuterol (VENTOLIN HFA) 108 (90 Base) MCG/ACT inhaler Inhale 2 puffs into the lungs every 6 (six) hours as needed for wheezing or shortness of breath.   azelastine (ASTELIN) 0.1 % nasal spray Place 2 sprays into both nostrils 2 (two) times daily. Use in each nostril as directed   betamethasone dipropionate (DIPROLENE) 0.05 % cream APPLY TOPICALLY 2 (TWO) TIMES DAILY.   Biotin 10 MG TABS Take 1 tablet by mouth daily.   busPIRone (BUSPAR) 7.5 MG tablet TAKE 1 TABLET BY MOUTH TWICE A DAY   cromolyn (OPTICROM) 4 % ophthalmic solution PLACE 1 DROP INTO BOTH EYES FOUR TIMES DAILY   Fluocinolone Acetonide 0.01 % OIL Apply sparingly to scalp two times  Weekly, as needed, for itch and flaking   Fluocinolone Acetonide Scalp 0.01 % OIL Apply 1 application topically as directed.   fluticasone (FLONASE) 50 MCG/ACT nasal spray SPRAY 2 SPRAYS INTO EACH NOSTRIL EVERY DAY   ketoconazole (NIZORAL) 2 % cream APPLY 1 APPLICATION TOPICALLY AS NEEDED FOR DERMATITIS   ketoconazole (NIZORAL) 2 % shampoo APPLY TO SCALP FOR 5 MINUTES AND RINSE OUT   levocetirizine (XYZAL) 5 MG tablet TAKE 1 TABLET BY MOUTH EVERY DAY IN THE EVENING   montelukast (SINGULAIR) 10 MG tablet TAKE 1 TABLET BY  MOUTH EVERY DAY   nystatin (MYCOSTATIN/NYSTOP) powder APPLY TO AFFECTED AREA TWICE A DAY AS NEEDED   omeprazole (PRILOSEC) 20 MG capsule THE PATIENT HAS TROUBLE SWALLOWING. PLEASE TAKE 2 CAPSULES TWICE A DAY BY MOUTH   ondansetron (ZOFRAN) 4 MG tablet Take one tablet by mouth once daily , as needed, for nausea   oseltamivir (TAMIFLU) 75 MG capsule Take 1 capsule (75 mg total) by mouth 2 (two) times daily.   predniSONE (STERAPRED UNI-PAK 21 TAB) 10 MG (21) TBPK tablet Use as directed   Respiratory Therapy Supplies (NEBULIZER/ADULT MASK) KIT Insurance preference   Respiratory Therapy Supplies (NEBULIZER/TUBING/MOUTHPIECE) KIT Use as directed. Insurance preference. Nebulizer kit and mask   sertraline (ZOLOFT) 50 MG tablet TAKE 1 TABLET BY MOUTH EVERY DAY   Spacer/Aero-Holding Chambers (AEROCHAMBER PLUS) inhaler Use as instructed   SYMBICORT 160-4.5 MCG/ACT inhaler TAKE 2 PUFFS BY MOUTH TWICE A DAY   UNABLE TO FIND Gloves- 1 box per month as needed   UNABLE TO FIND Under pads- for use daily as needed   UNABLE TO FIND Incontinence liners- use as needed for incontinence Gloves- 1 box as needed for incontinence DX urinary incontinence   UNABLE TO FIND Nebulizer machine and tubing  DX J45.40   [DISCONTINUED] Calcium Carbonate (CALCIUM 500 PO) Take 1 tablet by mouth daily.    No facility-administered encounter medications on file as of 01/22/2021.    Allergies (verified) Other and Ciprofloxacin  History: Past Medical History:  Diagnosis Date   Allergy    Anxiety    Asthma    Depression, major, single episode, moderate (HCC) 09/05/2017   GERD (gastroesophageal reflux disease)    Neuromuscular disorder (HCC)    Osteomyelitis (HCC) June 2011   Otitis externa, eczematoid 05/28/2019   Perennial allergic rhinitis    Seborrheic dermatitis of scalp 2006   Spinal muscle atrophy (HCC)    type 2/3 18 months   Past Surgical History:  Procedure Laterality Date   bilateral hip reinsertion  under  age 75   hips out of socket, pt was only able to cruise hold on and move short distances    BONE BIOPSY     spinal bone    MIRENA PLACED 10/27/10     SPINAL FUSION  1999   rods placed in thoracolumbar spine to excessive scoliosis primarily    SPINE SURGERY N/A    [employer]reesia 07/29/2020   Family History  Problem Relation Age of Onset   Hyperlipidemia Mother    Hypertension Mother    Hypertension Brother    Diabetes Brother        borderline    Anxiety disorder Brother    Depression Brother    Drug abuse Brother    Social History   Socioeconomic History   Marital status: Single    Spouse name: Not on file   Number of children: Not on file   Years of education: Not on file   Highest education level: Associate degree: academic program  Occupational History   Occupation: student  Tobacco Use   Smoking status: Never   Smokeless tobacco: Never  Vaping Use   Vaping Use: Never used  Substance and Sexual Activity   Alcohol use: No   Drug use: No   Sexual activity: Never  Other Topics Concern   Not on file  Social History Narrative   She is a Electrical engineer a Probation officer in [employer]     Social Determinants of Health   Financial Resource Strain: Not on file  Food Insecurity: Not on file  Transportation Needs: Not on file  Physical Activity: Not on file  Stress: Not on file  Social Connections: Not on file    Tobacco Counseling Counseling given: Not Answered   Clinical Intake:                 Diabetic? No          Activities of Daily Living No flowsheet data found.  Patient Care Team: Kerri Perches, MD as PCP - General (Family Medicine) Lysbeth Penner Rockie Neighbours, MD as Referring Physician (Pulmonary Disease)  Indicate any recent Medical Services you may have received from other than [facility] providers in the past year (date may be approximate).     Assessment:   This is a routine wellness examination for Janet Acevedo.  Hearing/Vision  screen No results found.  Dietary issues and exercise activities discussed:     Goals Addressed   None   Depression Screen PHQ 2/9 Scores 07/29/2020 06/12/2020 06/12/2020 02/23/2020 01/22/2020 01/03/2020 09/27/2019  PHQ - 2 Score 0 5 1 0 0 0 4  PHQ- 9 Score - 16 - - 0 - 13  Some encounter information is confidential and restricted. Go to Review Flowsheets activity to see all data.    Fall Risk Fall Risk  07/29/2020 06/12/2020 02/23/2020 01/22/2020 01/03/2020  Falls in the past year? 0 Exclusion - non ambulatory 0 Exclusion - non ambulatory 1  Number falls in past yr: 0 - 0 - 1  Injury with Fall? 0 - 0 - 0  Risk for fall due to : No Fall Risks - No Fall Risks - -  Follow up Falls evaluation completed - Falls evaluation completed - -    FALL RISK PREVENTION PERTAINING TO THE HOME:  Any stairs in or around the home? No  If so, are there any without handrails? No  Home free of loose throw rugs in walkways, pet beds, electrical cords, etc? Yes  Adequate lighting in your home to reduce risk of falls? Yes   ASSISTIVE DEVICES UTILIZED TO PREVENT FALLS:  Life alert? No  Use of a cane, walker or w/c? No  Grab bars in the bathroom? No  Shower chair or bench in shower? Yes  Elevated toilet seat or a handicapped toilet? Yes   TIMED UP AND GO:  Was the test performed? No .     Cognitive Function:     6CIT Screen 01/22/2020 12/22/2018 07/12/2017  What Year? 0 points 0 points 0 points  What month? 0 points 0 points 0 points  What time? 0 points 0 points 0 points  Count back from 20 0 points 0 points 0 points  Months in reverse 0 points 0 points 0 points  Repeat phrase 0 points 0 points 0 points  Total Score 0 0 0    Immunizations Immunization History  Administered Date(s) Administered   Influenza Split 04/07/2012   Influenza Whole 04/07/2010, 04/03/2011   Influenza,inj,Quad PF,6+ Mos 05/10/2013, 07/12/2014, 04/09/2015, 05/05/2016, 05/03/2017, 05/10/2018, 06/20/2019, 04/05/2020    Moderna Sars-Covid-2 Vaccination 10/25/2019, 11/20/2019   Pneumococcal Conjugate-13 01/20/2017    TDAP status: Up to date  Flu Vaccine status: Up to date  Pneumococcal vaccine status: Up to date  Covid-19 vaccine status: Completed vaccines  Qualifies for Shingles Vaccine? No   Zostavax completed No   Shingrix Completed?: No.    Education has been provided regarding the importance of this vaccine. Patient has been advised to call insurance company to determine out of pocket expense if they have not yet received this vaccine. Advised may also receive vaccine at local pharmacy or Health Dept. Verbalized acceptance and understanding.  Screening Tests Health Maintenance  Topic Date Due   Pneumococcal Vaccine 54-57 Years old (1 - PCV) Never done   Hepatitis C Screening  Never done   PAP-Cervical Cytology Screening  Never done   PAP SMEAR-Modifier  Never done   COVID-19 Vaccine (3 - Moderna risk series) 12/18/2019   INFLUENZA VACCINE  02/24/2021   TETANUS/TDAP  07/27/2025   HIV Screening  Completed   HPV VACCINES  Aged Out    Health Maintenance  Health Maintenance Due  Topic Date Due   Pneumococcal Vaccine 48-35 Years old (1 - PCV) Never done   Hepatitis C Screening  Never done   PAP-Cervical Cytology Screening  Never done   PAP SMEAR-Modifier  Never done   COVID-19 Vaccine (3 - Moderna risk series) 12/18/2019    Colorectal cancer screening: No longer required. Not required due to age.   Mammogram status: No longer required due to age.   Lung Cancer Screening: (Low Dose CT Chest recommended if Age 84-80 years, 30 pack-year currently smoking OR have quit w/in 15years.) does not qualify.   Lung Cancer Screening Referral: N/A   Additional Screening:  Hepatitis C Screening: does qualify; Completed.   Vision Screening: Recommended annual ophthalmology exams for early detection of glaucoma and other disorders of  the eye. Is the patient up to date with their annual eye exam?   Yes  Who is the provider or what is the name of the office in which the patient attends annual eye exams? [facility] in [place] If pt is not established with a provider, would they like to be referred to a provider to establish care? No .   Dental Screening: Recommended annual dental exams for proper oral hygiene  Community Resource Referral / Chronic Care Management: CRR required this visit?  No   CCM required this visit?  No      Plan:     I have personally reviewed and noted the following in the patient's chart:   Medical and social history Use of alcohol, tobacco or illicit drugs  Current medications and supplements including opioid prescriptions.  Functional ability and status Nutritional status Physical activity Advanced directives List of other physicians Hospitalizations, surgeries, and ER visits in previous 12 months Vitals Screenings to include cognitive, depression, and falls Referrals and appointments  In addition, I have reviewed and discussed with patient certain preventive protocols, quality metrics, and best practice recommendations. A written personalized care plan for preventive services as well as general preventive health recommendations were provided to patient.     Lonn Georgia, LPN   [id number]/66/0630   Nurse Notes: Pt consents to AWV via telephone. Pt present in her home at the time of the call and provider located on site at the office. This call took approx 20 min to conduct.    I have reviewed and agree with the above AWV documentation. Norwood Levo. Moshe Cipro, MD [facility] January 23, 2021, 06:00 AM

## 2021-01-22 NOTE — Telephone Encounter (Signed)
During AWV call pt stated she would like to get some fasting labs done and have a phone visit follow up since it has been a while. I got her scheduled for 02/26/21, which labs would you like ordered for her?

## 2021-01-23 ENCOUNTER — Other Ambulatory Visit: Payer: Self-pay

## 2021-01-23 ENCOUNTER — Encounter: Payer: Self-pay | Admitting: Family Medicine

## 2021-01-23 DIAGNOSIS — M8580 Other specified disorders of bone density and structure, unspecified site: Secondary | ICD-10-CM

## 2021-01-23 DIAGNOSIS — Z1159 Encounter for screening for other viral diseases: Secondary | ICD-10-CM

## 2021-01-23 DIAGNOSIS — F321 Major depressive disorder, single episode, moderate: Secondary | ICD-10-CM

## 2021-01-23 DIAGNOSIS — K219 Gastro-esophageal reflux disease without esophagitis: Secondary | ICD-10-CM

## 2021-01-23 DIAGNOSIS — R7301 Impaired fasting glucose: Secondary | ICD-10-CM

## 2021-01-23 DIAGNOSIS — Z13220 Encounter for screening for lipoid disorders: Secondary | ICD-10-CM

## 2021-01-23 DIAGNOSIS — E785 Hyperlipidemia, unspecified: Secondary | ICD-10-CM

## 2021-01-23 DIAGNOSIS — E559 Vitamin D deficiency, unspecified: Secondary | ICD-10-CM

## 2021-01-23 NOTE — Telephone Encounter (Signed)
Orders placed.

## 2021-01-29 ENCOUNTER — Other Ambulatory Visit: Payer: Self-pay

## 2021-01-29 ENCOUNTER — Ambulatory Visit (INDEPENDENT_AMBULATORY_CARE_PROVIDER_SITE_OTHER): Payer: Medicare Other | Admitting: Psychiatry

## 2021-01-29 DIAGNOSIS — F411 Generalized anxiety disorder: Secondary | ICD-10-CM | POA: Diagnosis not present

## 2021-01-29 NOTE — Progress Notes (Signed)
Virtual Visit via Telephone Note  I connected with Janet Acevedo on 01/29/21 at 2:18 PM EDT by telephone and verified that I am speaking with the correct person using two identifiers.  Location: Patient: Home Provider: [place] office    I discussed the limitations, risks, security and privacy concerns of performing an evaluation and management service by telephone and the availability of in person appointments. I also discussed with the patient that there may be a patient responsible charge related to this service. The patient expressed understanding and agreed to proceed.   I provided 51 minutes of non-face-to-face time during this encounter.   [name], LCSW                    THERAPIST PROGRESS NOTE     Session Time:  Wednesday 01/29/2021 2:18 PM - 3:09 PM  Participation Level: Active  Behavioral Response: CasualAlertAnxious  Type of Therapy: Individual Therapy      Treatment Goals addressed: Patient wants to improve coping skills for anxiety, sustain her happiness without being dependent on relationship with others, improve self-esteem/self-worth, learn to speak up for self learn and implement cognitive and behavioral strategies to cope with anxiety  Interventions: CBT and Supportive   Summary: Janet Acevedo is a 30 y.o. female who is referred for services by psychiatrist Dr. Modesta Messing due to patient experiencing symptoms of anxiety. She denies any psychiatric hospitalizations and no previous involvement in psychotherapy.  patient reports having uncontrollable anxiety and finding herself in an anxious state every day. She reports having anxiety since childhood but has worsened in the last 1-2 years. She reports stress regarding an aunt who is a quadriplegic as her health has fluctuated. Patient reports being very close with his aunt. She also reports dealing with her sexuality and says she has had a girlfriend secretly for the past 8 years. She says she  used to be okay with this but now being tired of hiding it. She also worries about her brother who is in prison and openly gay. Patient's reported symptoms of anxiety include "sweaty palms, hands tremble alot, voice is shakey, emotional outbursts of crying, increased heart rate".  Patient last was  seen about 2 weeks ago.  She reports increased symptoms of depression and anxiety including ruminating thoughts, excessive sleeping, poor motivation, diminished interest and pleasure in activities, poor concentration, nervousness, and decreased appetite.  She reports multiple stressors including the recent death of a family member.  Per patient's report, her last contact with the member was a negative interaction due to negative comments from the member.  She reports additional stress related to ongoing issues such as concerns about her sister, memories related to emotional trauma from her last relationship, continued expectations from family members, and difficulty learning how to navigate new relationships.  Patient reports feeling overwhelmed.  Suicidal/Homicidal: Nowithout intent/plan  Therapist Response:  Reviewed symptoms, discussed stressors, facilitated expression of thoughts and feelings, validated feelings, assisted patient identify her thought patterns and the effects of her current functioning, discussed rationale for and assisted patient practice mindfulness activity (leaves on a stream) to cope with ruminating thoughts and painful feelings, sent handout to patient via email, develop plan with patient to practice mindfulness activity 10 to 15 minutes daily, also develop plan with patient to increase involvement in activity with the use of daily planning scheduling 1 activity for morning/evening/afternoon    Plan: Return again in 2 weeks.  Diagnosis: Axis I: Generalized Anxiety Disorder  Axis II: Deferred    [name], LCSW 01/29/2021

## 2021-02-07 ENCOUNTER — Other Ambulatory Visit: Payer: Self-pay | Admitting: Family Medicine

## 2021-02-11 ENCOUNTER — Other Ambulatory Visit: Payer: Self-pay

## 2021-02-11 ENCOUNTER — Ambulatory Visit (INDEPENDENT_AMBULATORY_CARE_PROVIDER_SITE_OTHER): Payer: Medicare Other | Admitting: Psychiatry

## 2021-02-11 DIAGNOSIS — F411 Generalized anxiety disorder: Secondary | ICD-10-CM | POA: Diagnosis not present

## 2021-02-11 NOTE — Progress Notes (Signed)
Virtual Visit via Video Note  I connected with Janet Acevedo on 02/11/21 at 2:15 PM EDT  by a video enabled telemedicine application and verified that I am speaking with the correct person using two identifiers.  Location: Patient: Home Provider:  [place] office    I discussed the limitations of evaluation and management by telemedicine and the availability of in person appointments. The patient expressed understanding and agreed to proceed.  I provided 45 minutes of non-face-to-face time during this encounter.   [name], LCSW                    THERAPIST PROGRESS NOTE     Session Time:  Tuesday  02/11/2021 2:15 PM -  3:00 PM   Participation Level: Active  Behavioral Response: CasualAlert/ less anxious  Type of Therapy: Individual Therapy      Treatment Goals addressed: Patient wants to improve coping skills for anxiety, sustain her happiness without being dependent on relationship with others, improve self-esteem/self-worth, learn to speak up for self learn and implement cognitive and behavioral strategies to cope with anxiety  Interventions: CBT and Supportive   Summary: Janet Acevedo is a 30 y.o. female who is referred for services by psychiatrist Dr. Modesta Messing due to patient experiencing symptoms of anxiety. She denies any psychiatric hospitalizations and no previous involvement in psychotherapy.  patient reports having uncontrollable anxiety and finding herself in an anxious state every day. She reports having anxiety since childhood but has worsened in the last 1-2 years. She reports stress regarding an aunt who is a quadriplegic as her health has fluctuated. Patient reports being very close with his aunt. She also reports dealing with her sexuality and says she has had a girlfriend secretly for the past 8 years. She says she used to be okay with this but now being tired of hiding it. She also worries about her brother who is in prison and openly gay.  Patient's reported symptoms of anxiety include "sweaty palms, hands tremble alot, voice is shakey, emotional outbursts of crying, increased heart rate".  Patient last was  seen about 2 weeks ago.  She reports decreased symptoms of depression and anxiety.  Per her report, she has resumed interest and pleasure in activities.  She started reading a self-help book.  She also initiated contact with 2 high school friends and made plans to review the book together.  Patient is very pleased about this and states feeling like she now has more of a community.  She reports feeling less overwhelmed and anxious.  She also reports decreased ruminating thoughts.  Patient reports she has been practicing the mindfulness activity practicing last session and says this has been helpful.  She reports decreased stress about another relationship since she used assertiveness skills and set/maintain limits regarding the relationship   suicidal/Homicidal: Nowithout intent/plan  Therapist Response:  Reviewed symptoms, patient reinforced patient's increased behavioral activation/initiating contact with friends/using assertiveness skills, praised and reinforced patient's use of mindfulness activity, discussed effects, assisted patient identify ways to reinforce self regarding consistent efforts and progress, developed plan with patient to journal daily to assist her in her efforts, discussed patient's progress in treatment, reviewed treatment plan, obtained patient's verbal consent to initial treatment plan review for patient as this was a virtual visit, discussed next steps for treatment to focus on learning and implementing relapse prevention strategies, developed plan with patient to write list of her most common triggers of anxiety in preparation for next  session    Plan: Return again in 2 weeks.  Diagnosis: Axis I: Generalized Anxiety Disorder    Axis II: Deferred    [name], LCSW 02/11/2021

## 2021-02-25 ENCOUNTER — Telehealth (INDEPENDENT_AMBULATORY_CARE_PROVIDER_SITE_OTHER): Payer: Medicare Other | Admitting: Family Medicine

## 2021-02-25 ENCOUNTER — Ambulatory Visit (HOSPITAL_COMMUNITY): Payer: Medicare Other | Admitting: Psychiatry

## 2021-02-25 ENCOUNTER — Other Ambulatory Visit: Payer: Self-pay

## 2021-02-25 ENCOUNTER — Encounter: Payer: Self-pay | Admitting: Family Medicine

## 2021-02-25 DIAGNOSIS — J302 Other seasonal allergic rhinitis: Secondary | ICD-10-CM

## 2021-02-25 DIAGNOSIS — E559 Vitamin D deficiency, unspecified: Secondary | ICD-10-CM

## 2021-02-25 DIAGNOSIS — J4541 Moderate persistent asthma with (acute) exacerbation: Secondary | ICD-10-CM | POA: Diagnosis not present

## 2021-02-25 DIAGNOSIS — L219 Seborrheic dermatitis, unspecified: Secondary | ICD-10-CM

## 2021-02-25 DIAGNOSIS — F411 Generalized anxiety disorder: Secondary | ICD-10-CM | POA: Diagnosis not present

## 2021-02-25 DIAGNOSIS — E785 Hyperlipidemia, unspecified: Secondary | ICD-10-CM

## 2021-02-25 MED ORDER — KETOCONAZOLE 2 % EX SHAM: MEDICATED_SHAMPOO | CUTANEOUS | 4 refills | Status: DC

## 2021-02-25 MED ORDER — SYMBICORT 160-4.5 MCG/ACT IN AERO: 2.0000 | INHALATION_SPRAY | Freq: Two times a day (BID) | RESPIRATORY_TRACT | 12 refills | Status: DC

## 2021-02-25 MED ORDER — BUSPIRONE HCL 7.5 MG PO TABS: ORAL_TABLET | ORAL | 3 refills | Status: DC

## 2021-02-25 MED ORDER — FLUOCINOLONE ACETONIDE 0.01 % OT OIL: TOPICAL_OIL | OTIC | 2 refills | Status: DC

## 2021-02-25 NOTE — Patient Instructions (Signed)
Annual exam in the office with MD in October, flu vaccine at that visit, call if you need me sooner  Please get fasting labs already ordered as soon as possible  Dose increase in buspar to 1 tab twice daily, half tablet in mid afternoon if needed  I will attempt to find a more appropriate therapist  for your needs  Thanks for choosing [facility], we consider it a privelige to serve you.

## 2021-02-25 NOTE — Progress Notes (Signed)
Virtual Visit via Telephone Note  I connected with Janet Acevedo on 02/25/21 at  1:40 PM EDT by telephone and verified that I am speaking with the correct person using two identifiers.  Location: Patient: home Provider: office   I discussed the limitations, risks, security and privacy concerns of performing an evaluation and management service by telephone and the availability of in person appointments. I also discussed with the patient that there may be a patient responsible charge related to this service. The patient expressed understanding and agreed to proceed.   History of Present Illness: F/U chronic problems C/o uncontrolled anxiety, and is requesting a change in therapist to one who she feels will be better able to specifically address sexuality and help her as she deals with recent loss of her partner She is not suicidal an overall does not feelas though her depression is uncontrolled No other concerns Labs overdue and she will get them in the next week   Observations/Objective: There were no vitals taken for this visit. Good communication with no confusion and intact memory. Alert and oriented x 3 No signs of respiratory distress during speech   Assessment and Plan: GAD (generalized anxiety disorder) Uncontrolled increase buspar to one  twice daily and half in the afternoon if needed, also try to get new Therapist  Dyslipidemia Hyperlipidemia:Low fat diet discussed and encouraged.   Lipid Panel  Lab Results  Component Value Date   CHOL 197 02/26/2021   HDL 38 (L) 02/26/2021   LDLCALC 142 (H) 02/26/2021   TRIG 93 02/26/2021   CHOLHDL 5.2 (H) 02/26/2021     Needs to lower fat intake  Vitamin D deficiency Uncorrected , start once weekly vit D  Seasonal allergies Controlled, no change in medication   Seborrheic dermatitis of scalp Controlled, no change in medication   Moderate persistent asthma Controlled, no change in medication   Follow Up  Instructions:    I discussed the assessment and treatment plan with the patient. The patient was provided an opportunity to ask questions and all were answered. The patient agreed with the plan and demonstrated an understanding of the instructions.   The patient was advised to call back or seek an in-person evaluation if the symptoms worsen or if the condition fails to improve as anticipated.  I provided 22 minutes of non-face-to-face time during this encounter.   Tula Nakayama, MD

## 2021-02-26 DIAGNOSIS — Z13220 Encounter for screening for lipoid disorders: Secondary | ICD-10-CM | POA: Diagnosis not present

## 2021-02-26 DIAGNOSIS — E559 Vitamin D deficiency, unspecified: Secondary | ICD-10-CM | POA: Diagnosis not present

## 2021-02-26 DIAGNOSIS — R7301 Impaired fasting glucose: Secondary | ICD-10-CM | POA: Diagnosis not present

## 2021-02-26 DIAGNOSIS — M8580 Other specified disorders of bone density and structure, unspecified site: Secondary | ICD-10-CM | POA: Diagnosis not present

## 2021-02-26 DIAGNOSIS — K219 Gastro-esophageal reflux disease without esophagitis: Secondary | ICD-10-CM | POA: Diagnosis not present

## 2021-02-26 DIAGNOSIS — E785 Hyperlipidemia, unspecified: Secondary | ICD-10-CM | POA: Diagnosis not present

## 2021-02-26 DIAGNOSIS — Z1159 Encounter for screening for other viral diseases: Secondary | ICD-10-CM | POA: Diagnosis not present

## 2021-02-27 ENCOUNTER — Other Ambulatory Visit: Payer: Self-pay | Admitting: Family Medicine

## 2021-02-27 MED ORDER — ERGOCALCIFEROL 1.25 MG (50000 UT) PO CAPS: 50000.0000 [IU] | ORAL_CAPSULE | ORAL | 2 refills | Status: DC

## 2021-02-28 LAB — LIPID PANEL
Chol/HDL Ratio: 5.2 ratio — ABNORMAL HIGH (ref 0.0–4.4)
Cholesterol, Total: 197 mg/dL (ref 100–199)
HDL: 38 mg/dL — ABNORMAL LOW (ref >39)
LDL Chol Calc (NIH): 142 mg/dL — ABNORMAL HIGH (ref 0–99)
Triglycerides: 93 mg/dL (ref 0–149)
VLDL Cholesterol Cal: 17 mg/dL (ref 5–40)

## 2021-02-28 LAB — CMP14+EGFR
ALT: 15 IU/L (ref 0–32)
AST: 14 IU/L (ref 0–40)
Albumin/Globulin Ratio: 1.3 (ref 1.2–2.2)
Albumin: 4.3 g/dL (ref 3.9–5.0)
Alkaline Phosphatase: 104 IU/L (ref 44–121)
BUN/Creatinine Ratio: 47 — ABNORMAL HIGH (ref 9–23)
BUN: 9 mg/dL (ref 6–20)
Bilirubin Total: 0.4 mg/dL (ref 0.0–1.2)
CO2: 25 mmol/L (ref 20–29)
Calcium: 9 mg/dL (ref 8.7–10.2)
Chloride: 100 mmol/L (ref 96–106)
Creatinine, Ser: 0.19 mg/dL — ABNORMAL LOW (ref 0.57–1.00)
Globulin, Total: 3.4 g/dL (ref 1.5–4.5)
Glucose: 75 mg/dL (ref 65–99)
Potassium: 4 mmol/L (ref 3.5–5.2)
Sodium: 140 mmol/L (ref 134–144)
Total Protein: 7.7 g/dL (ref 6.0–8.5)
eGFR: 164 mL/min/{1.73_m2} (ref >59)

## 2021-02-28 LAB — CBC WITH DIFFERENTIAL/PLATELET
Basophils Absolute: 0.1 10*3/uL (ref 0.0–0.2)
Basos: 1 %
EOS (ABSOLUTE): 0.5 10*3/uL — ABNORMAL HIGH (ref 0.0–0.4)
Eos: 4 %
Hematocrit: 40.7 % (ref 34.0–46.6)
Hemoglobin: 13 g/dL (ref 11.1–15.9)
Immature Grans (Abs): 0 10*3/uL (ref 0.0–0.1)
Immature Granulocytes: 0 %
Lymphocytes Absolute: 3.5 10*3/uL — ABNORMAL HIGH (ref 0.7–3.1)
Lymphs: 34 %
MCH: 27.4 pg (ref 26.6–33.0)
MCHC: 31.9 g/dL (ref 31.5–35.7)
MCV: 86 fL (ref 79–97)
Monocytes Absolute: 0.8 10*3/uL (ref 0.1–0.9)
Monocytes: 7 %
Neutrophils Absolute: 5.7 10*3/uL (ref 1.4–7.0)
Neutrophils: 54 %
Platelets: 240 10*3/uL (ref 150–450)
RBC: 4.74 x10E6/uL (ref 3.77–5.28)
RDW: 13.4 % (ref 11.7–15.4)
WBC: 10.5 10*3/uL (ref 3.4–10.8)

## 2021-02-28 LAB — HEPATITIS C ANTIBODY: Hep C Virus Ab: 0.1 s/co ratio (ref 0.0–0.9)

## 2021-02-28 LAB — HEMOGLOBIN A1C
Est. average glucose Bld gHb Est-mCnc: 97 mg/dL
Hgb A1c MFr Bld: 5 % (ref 4.8–5.6)

## 2021-02-28 LAB — TSH: TSH: 1.34 u[IU]/mL (ref 0.450–4.500)

## 2021-02-28 LAB — VITAMIN D 25 HYDROXY (VIT D DEFICIENCY, FRACTURES): Vit D, 25-Hydroxy: 19.2 ng/mL — ABNORMAL LOW (ref 30.0–100.0)

## 2021-03-03 ENCOUNTER — Encounter: Payer: Self-pay | Admitting: Family Medicine

## 2021-03-03 DIAGNOSIS — E559 Vitamin D deficiency, unspecified: Secondary | ICD-10-CM | POA: Insufficient documentation

## 2021-03-03 NOTE — Assessment & Plan Note (Addendum)
Uncontrolled increase buspar to one  twice daily and half in the afternoon if needed, also try to get new Therapist

## 2021-03-03 NOTE — Assessment & Plan Note (Signed)
Controlled, no change in medication  

## 2021-03-03 NOTE — Assessment & Plan Note (Signed)
Uncorrected , start once weekly vit D

## 2021-03-03 NOTE — Assessment & Plan Note (Signed)
Hyperlipidemia:Low fat diet discussed and encouraged.   Lipid Panel  Lab Results  Component Value Date   CHOL 197 02/26/2021   HDL 38 (L) 02/26/2021   LDLCALC 142 (H) 02/26/2021   TRIG 93 02/26/2021   CHOLHDL 5.2 (H) 02/26/2021     Needs to lower fat intake

## 2021-03-11 ENCOUNTER — Ambulatory Visit (INDEPENDENT_AMBULATORY_CARE_PROVIDER_SITE_OTHER): Payer: Medicare Other | Admitting: Psychiatry

## 2021-03-11 ENCOUNTER — Other Ambulatory Visit: Payer: Self-pay

## 2021-03-11 DIAGNOSIS — F411 Generalized anxiety disorder: Secondary | ICD-10-CM

## 2021-03-11 NOTE — Progress Notes (Signed)
Virtual Visit via Video Note  I connected with Janet Acevedo on 03/11/21 at 2:20 PM EDT by a video enabled telemedicine application and verified that I am speaking with the correct person using two identifiers.  Location: Patient: Home Provider: [place] office    I discussed the limitations of evaluation and management by telemedicine and the availability of in person appointments. The patient expressed understanding and agreed to proceed.    I provided 45 minutes of non-face-to-face time during this encounter.   [name], LCSW                    THERAPIST PROGRESS NOTE     Session Time:  Tuesday  03/11/2021 2:20 PM - 3:05 PM   Participation Level: Active  Behavioral Response: CasualAlert/ less anxious  Type of Therapy: Individual Therapy      Treatment Goals addressed: Patient wants to improve coping skills for anxiety, sustain her happiness without being dependent on relationship with others, improve self-esteem/self-worth, learn to speak up for self learn and implement cognitive and behavioral strategies to cope with anxiety  Interventions: CBT and Supportive   Summary: Janet Acevedo is a 30 y.o. female who is referred for services by psychiatrist Dr. Modesta Messing due to patient experiencing symptoms of anxiety. She denies any psychiatric hospitalizations and no previous involvement in psychotherapy.  patient reports having uncontrollable anxiety and finding herself in an anxious state every day. She reports having anxiety since childhood but has worsened in the last 1-2 years. She reports stress regarding an aunt who is a quadriplegic as her health has fluctuated. Patient reports being very close with his aunt. She also reports dealing with her sexuality and says she has had a girlfriend secretly for the past 8 years. She says she used to be okay with this but now being tired of hiding it. She also worries about her brother who is in prison and openly  gay. Patient's reported symptoms of anxiety include "sweaty palms, hands tremble alot, voice is shakey, emotional outbursts of crying, increased heart rate".  Patient last was  seen about 2-3 weeks ago.  She reports continued symptoms of depression and anxiety including memory difficulty, becoming easily overwhelmed, social withdrawal, poor motivation, and excessive worry.  She reports multiple stressors including recent contact from her ex partner and demands/expectations from her family.  Patient states feeling overstimulated.  She expresses frustration family members are constantly calling her name and expecting her to do things.  She reports having little to no time for self.  She has maintain contact  with a friend and reports she has enjoyed reading and reviewing book with friend per her report.  suicidal/Homicidal: Nowithout intent/plan  Therapist Response:  Reviewed symptoms, discussed stressors, facilitated expression of thoughts and feelings, validated feelings, assisted patient try to identify triggers of increased symptoms of depression and anxiety, and assisted patient examine her pattern of interaction, assisted patient identify ways to improve assertiveness skills to set maintain limits with family regarding scheduled time for patient, discussed possible prescription, developed plan with patient to schedule 1 hour for self daily between 6 and 7 PM, assisted patient identify and address thoughts and processes that may inhibit implementation of plan   Plan: Return again in 2 weeks.  Diagnosis: Axis I: Generalized Anxiety Disorder    Axis II: Deferred    [name], LCSW 03/11/2021

## 2021-03-16 ENCOUNTER — Other Ambulatory Visit: Payer: Self-pay | Admitting: Family Medicine

## 2021-03-16 DIAGNOSIS — F324 Major depressive disorder, single episode, in partial remission: Secondary | ICD-10-CM

## 2021-03-16 DIAGNOSIS — F411 Generalized anxiety disorder: Secondary | ICD-10-CM

## 2021-03-16 NOTE — Progress Notes (Signed)
Amb psych  

## 2021-03-25 ENCOUNTER — Telehealth (HOSPITAL_COMMUNITY): Payer: Self-pay | Admitting: Psychiatry

## 2021-03-25 ENCOUNTER — Other Ambulatory Visit: Payer: Self-pay

## 2021-03-25 ENCOUNTER — Other Ambulatory Visit: Payer: Self-pay | Admitting: Family Medicine

## 2021-03-25 ENCOUNTER — Ambulatory Visit (HOSPITAL_COMMUNITY): Payer: Medicare Other | Admitting: Psychiatry

## 2021-03-25 NOTE — Telephone Encounter (Signed)
Therapist attempted to contact patient twice via text through caregility platform, no response.  Therapist called patient and left message indicating attempt and requesting patient call office

## 2021-03-26 NOTE — Addendum Note (Signed)
Addended by: Eual Fines on: 03/26/2021 02:20 PM   Modules accepted: Orders, Level of Service, SmartSet

## 2021-04-02 ENCOUNTER — Telehealth: Payer: Medicare Other | Admitting: Licensed Clinical Social Worker

## 2021-04-02 ENCOUNTER — Telehealth (INDEPENDENT_AMBULATORY_CARE_PROVIDER_SITE_OTHER): Payer: Medicare Other | Admitting: Licensed Clinical Social Worker

## 2021-04-02 ENCOUNTER — Other Ambulatory Visit: Payer: Self-pay

## 2021-04-02 DIAGNOSIS — F324 Major depressive disorder, single episode, in partial remission: Secondary | ICD-10-CM

## 2021-04-02 DIAGNOSIS — F411 Generalized anxiety disorder: Secondary | ICD-10-CM

## 2021-04-02 NOTE — BH Specialist Note (Signed)
[facility] Initial Clinical Assessment  MRN: [id number] NAME: CARLINE DURA Date: 04/02/21  Start time:   1130aEnd time:  12a Total time:  46mn Call number:  [phone] Type of Contact:  began video but due to technical we transitioned to phone Patient consent obtained:  yes Reason for Visit today:  initiate VBH service  Treatment History Patient recently received Inpatient Treatment:  no  Facility/Program:  no  Date of discharge:   Patient currently being seen by therapist/psychiatrist:   Patient currently receiving the following services:    Past Psychiatric History/Hospitalization(s): Anxiety: Yes Bipolar Disorder: No Depression: Yes Mania: No Psychosis: No Schizophrenia: No Personality Disorder: No Hospitalization for psychiatric illness: No History of Electroconvulsive Shock Therapy: No Prior Suicide Attempts: No  Clinical Assessment:  PHQ-9 Assessments: [facility] 04/02/2021 02/25/2021 01/22/2021  Decreased Interest 3 0 0  Down, Depressed, Hopeless _0 PHQ - 2 Score _1 Altered sleeping 2 - -  Tired, decreased energy 2 - -  Change in appetite 1 - -  Feeling bad or failure about yourself  1 - -  Trouble concentrating 2 - -  Moving slowly or fidgety/restless 2 - -  Suicidal thoughts 0 - -  PHQ-9 Score 16 - -  Difficult doing work/chores Somewhat difficult - -  Some recent data might be hidden    GAD-7 Assessments: GAD 7 : Generalized Anxiety Score 04/02/2021 02/25/2021 09/10/2020 06/12/2020  Nervous, Anxious, on Edge _2 Control/stop worrying _3 Worry too much - different things _4 Trouble relaxing 2 1 0 2  Restless 2 1 0 3  Easily annoyed or irritable 2 0 0 1  Afraid - awful might happen 2 0 0 0  Total GAD 7 Score _5 Anxiety Difficulty Somewhat difficult - Not difficult at all -      Current medications:  Outpatient Encounter Medications as of 04/02/2021  Medication Sig   albuterol (ACCUNEB)  1.25 MG/3ML nebulizer solution Take 3 mLs (1.25 mg total) by nebulization every 4 (four) hours as needed. Dx J45.40   albuterol (PROVENTIL) (2.5 MG/3ML) 0.083% nebulizer solution Take 3 mLs (2.5 mg total) by nebulization every 6 (six) hours as needed for wheezing or shortness of breath.   albuterol (VENTOLIN HFA) 108 (90 Base) MCG/ACT inhaler Inhale 2 puffs into the lungs every 6 (six) hours as needed for wheezing or shortness of breath.   azelastine (ASTELIN) 0.1 % nasal spray Place 2 sprays into both nostrils 2 (two) times daily. Use in each nostril as directed   betamethasone dipropionate (DIPROLENE) 0.05 % cream APPLY TOPICALLY 2 (TWO) TIMES DAILY.   Biotin 10 MG TABS Take 1 tablet by mouth daily.   busPIRone (BUSPAR) 7.5 MG tablet Take one tablet by mouth at 7 am , and at 10 pm, you may take half tablet for anxiety at 2 pm   cromolyn (OPTICROM) 4 % ophthalmic solution PLACE 1 DROP INTO BOTH EYES FOUR TIMES DAILY   ergocalciferol (VITAMIN D2) 1.25 MG (50000 UT) capsule Take 1 capsule (50,000 Units total) by mouth once a week. One capsule once weekly   Fluocinolone Acetonide 0.01 % OIL Apply sparingly to scalp two times  Weekly, as needed, for itch and flaking   fluticasone (FLONASE) 50 MCG/ACT nasal spray SPRAY 2 SPRAYS INTO EACH NOSTRIL EVERY DAY   ketoconazole (NIZORAL) 2 % cream APPLY 1 APPLICATION  TOPICALLY AS NEEDED FOR DERMATITIS   ketoconazole (NIZORAL) 2 % shampoo APPLY TO SCALP FOR 5 MINUTES AND RINSE OUT   levocetirizine (XYZAL) 5 MG tablet TAKE 1 TABLET BY MOUTH EVERY DAY IN THE EVENING   montelukast (SINGULAIR) 10 MG tablet TAKE 1 TABLET BY MOUTH EVERY DAY   nystatin (MYCOSTATIN/NYSTOP) powder APPLY TO AFFECTED AREA TWICE A DAY AS NEEDED   omeprazole (PRILOSEC) 20 MG capsule THE PATIENT HAS TROUBLE SWALLOWING. PLEASE TAKE 2 CAPSULES TWICE A DAY BY MOUTH   ondansetron (ZOFRAN) 4 MG tablet Take one tablet by mouth once daily , as needed, for nausea   Respiratory Therapy Supplies  (NEBULIZER/ADULT MASK) KIT Insurance preference   Respiratory Therapy Supplies (NEBULIZER/TUBING/MOUTHPIECE) KIT Use as directed. Insurance preference. Nebulizer kit and mask   sertraline (ZOLOFT) 50 MG tablet TAKE 1 TABLET BY MOUTH EVERY DAY   Spacer/Aero-Holding Chambers (AEROCHAMBER PLUS) inhaler Use as instructed   SYMBICORT 160-4.5 MCG/ACT inhaler Inhale 2 puffs into the lungs in the morning and at bedtime.   UNABLE TO FIND Gloves- 1 box per month as needed   UNABLE TO FIND Under pads- for use daily as needed   UNABLE TO FIND Incontinence liners- use as needed for incontinence Gloves- 1 box as needed for incontinence DX urinary incontinence   UNABLE TO FIND Nebulizer machine and tubing  DX J45.40   [DISCONTINUED] Calcium Carbonate (CALCIUM 500 PO) Take 1 tablet by mouth daily.    No facility-administered encounter medications on file as of 04/02/2021.    Self-harm Behaviors Risk Assessment Self-harm risk factors:   Patient endorses recent thoughts of harming self:    [place] Suicide Severity Rating Scale:  C-SRSS 2020-10-10 2021-01-30  1. Wish to be Dead No No  2. Suicidal Thoughts No No  6. Suicide Behavior Question No No    Danger to Others Risk Assessment Danger to others risk factors:   Patient endorses recent thoughts of harming others:    Dynamic Appraisal of Situational Aggression (DASA): No flowsheet data found.  Substance Use Assessment Patient recently consumed alcohol:    Alcohol Use Disorder Identification Test (AUDIT):  Alcohol Use Disorder Test (AUDIT) 01/22/2021  1. How often do you have a drink containing alcohol? 0  2. How many drinks containing alcohol do you have on a typical day when you are drinking? 0  3. How often do you have six or more drinks on one occasion? 0  AUDIT-C Score 0   Patient recently used drugs:    Opioid Risk Assessment:  Patient is concerned about dependence or abuse of substances:    ASAM Multidimensional Assessment  Summary:  Dimension 1:    Dimension 1 Rating:    Dimension 2:    Dimension 2 Rating:    Dimension 3:    Dimension 3 Rating:    Dimension 4:    Dimension 4 Rating:    Dimension 5:    Dimension 5 Rating:    Dimension 6:    Dimension 6 Rating:   ASAM's Severity Rating Score:   ASAM Recommended Level of Treatment:     Goals, Interventions and Follow-up Plan Goals: Increase healthy adjustment to current life circumstances and Increase adequate support systems for patient/family Interventions: Motivational Interviewing, Solution-Focused Strategies, and CBT Cognitive Behavioral Therapy Follow-up Plan:  weekly VBH sessions for the initial 4 weeks then biweekly sessions  Summary of Clinical Assessment Summary: Legacy is a 30 year old woman referred by Dr. Moshe Cipro for counseling.  Donnae is currently in counseling  but feels stagnant with her progress.  She has a history of medication management about 1 year ago.  She receives her prescriptions from Dr. Moshe Cipro.  Currently taking Buspar and Zoloft daily.  She recently broke up with her girlfriend of 10 years; about 10 months ago.  She wants assistance with trauma.  She receives disability and is wheelchair bound. She wants to explore the idea of autism spectrum as she has researched and believes she has symptoms (sensory).   Lubertha South, LCSW

## 2021-04-08 ENCOUNTER — Ambulatory Visit (HOSPITAL_COMMUNITY): Payer: Medicare Other | Admitting: Psychiatry

## 2021-04-08 ENCOUNTER — Telehealth (HOSPITAL_COMMUNITY): Payer: Self-pay | Admitting: *Deleted

## 2021-04-08 ENCOUNTER — Other Ambulatory Visit: Payer: Self-pay

## 2021-04-08 NOTE — Telephone Encounter (Signed)
Patient called stating she have a family emergency and will not be able to do her appt today. Per pt her Janet Acevedo is in the hospital. Informed patient that appt will be a no show or cancelled due to provider.  Provider informed and aware. Patient stated okay. Staff informed provider and she stated to cancel appt.

## 2021-04-09 ENCOUNTER — Telehealth: Payer: Self-pay | Admitting: Licensed Clinical Social Worker

## 2021-04-09 DIAGNOSIS — F411 Generalized anxiety disorder: Secondary | ICD-10-CM

## 2021-04-09 DIAGNOSIS — F324 Major depressive disorder, single episode, in partial remission: Secondary | ICD-10-CM

## 2021-04-09 NOTE — BH Specialist Note (Signed)
Virtual Behavioral Health Treatment Plan Team Note  MRN: [id number] NAME: Janet Acevedo  DATE: 04/09/21  Start time: 315p  End time:  320p Total time:  5 min  Total number of [facility] [place] Treatment Team Plan encounters: 1/4  Treatment Team Attendees: Royal Piedra, LCSW & Dr. Modesta Messing, Psychiatrist  Diagnoses: No diagnosis found.  Goals, Interventions and Follow-up Plan Goals: Increase healthy adjustment to current life circumstances Increase adequate support systems for patient/family Interventions: Motivational Interviewing Solution-Focused Strategies CBT Cognitive Behavioral Therapy Medication Management Recommendations: no change in her medication Follow-up Plan: weekly VBH sessions for the initial 4 weeks then biweekly sessions  History of the present illness Presenting Problem/Current Symptoms: symptoms of DX   Psychiatric History  Depression: Yes Anxiety: Yes Mania: No Psychosis: No PTSD symptoms: No  Past Psychiatric History/Hospitalization(s): Hospitalization for psychiatric illness: No Prior Suicide Attempts: No Prior Self-injurious behavior: No  Psychosocial stressors recent breakup  Self-harm Behaviors Risk Assessment   Screenings PHQ-9 Assessments:  Depression screen [facility] 2/9 04/02/2021 02/25/2021 01/22/2021  Decreased Interest 3 0 0  Down, Depressed, Hopeless _0 PHQ - 2 Score _1 Altered sleeping 2 - -  Tired, decreased energy 2 - -  Change in appetite 1 - -  Feeling bad or failure about yourself  1 - -  Trouble concentrating 2 - -  Moving slowly or fidgety/restless 2 - -  Suicidal thoughts 0 - -  PHQ-9 Score 16 - -  Difficult doing work/chores Somewhat difficult - -  Some recent data might be hidden   GAD-7 Assessments:  GAD 7 : Generalized Anxiety Score 04/02/2021 02/25/2021 09/10/2020 06/12/2020  Nervous, Anxious, on Edge _2 Control/stop worrying _3 Worry too much - different things _4 Trouble relaxing 2 1 0 2  Restless  2 1 0 3  Easily annoyed or irritable 2 0 0 1  Afraid - awful might happen 2 0 0 0  Total GAD 7 Score _5 Anxiety Difficulty Somewhat difficult - Not difficult at all -    Past Medical History Past Medical History:  Diagnosis Date   Allergy    Anxiety    Asthma    Depression, major, single episode, moderate (HCC) 09/05/2017   GERD (gastroesophageal reflux disease)    Neuromuscular disorder (HCC)    Osteomyelitis (HCC) June 2011   Otitis externa, eczematoid 05/28/2019   Perennial allergic rhinitis    Seborrheic dermatitis of scalp 2006   Spinal muscle atrophy (HCC)    type 2/3 18 months    Vital signs: There were no vitals filed for this visit.  Allergies:  Allergies as of 04/09/2021 - Review Complete 03/03/2021  Allergen Reaction Noted   Other Diarrhea 01/01/2020   Ciprofloxacin Nausea And Vomiting 05/29/2015    Medication History Current medications:  Outpatient Encounter Medications as of 04/09/2021  Medication Sig   albuterol (ACCUNEB) 1.25 MG/3ML nebulizer solution Take 3 mLs (1.25 mg total) by nebulization every 4 (four) hours as needed. Dx J45.40   albuterol (PROVENTIL) (2.5 MG/3ML) 0.083% nebulizer solution Take 3 mLs (2.5 mg total) by nebulization every 6 (six) hours as needed for wheezing or shortness of breath.   albuterol (VENTOLIN HFA) 108 (90 Base) MCG/ACT inhaler Inhale 2 puffs into the lungs every 6 (six) hours as needed for wheezing or shortness of breath.   azelastine (ASTELIN) 0.1 % nasal spray Place 2 sprays into both nostrils  2 (two) times daily. Use in each nostril as directed   betamethasone dipropionate (DIPROLENE) 0.05 % cream APPLY TOPICALLY 2 (TWO) TIMES DAILY.   Biotin 10 MG TABS Take 1 tablet by mouth daily.   busPIRone (BUSPAR) 7.5 MG tablet Take one tablet by mouth at 7 am , and at 10 pm, you may take half tablet for anxiety at 2 pm   cromolyn (OPTICROM) 4 % ophthalmic solution PLACE 1 DROP INTO BOTH EYES FOUR TIMES DAILY   ergocalciferol  (VITAMIN D2) 1.25 MG (50000 UT) capsule Take 1 capsule (50,000 Units total) by mouth once a week. One capsule once weekly   Fluocinolone Acetonide 0.01 % OIL Apply sparingly to scalp two times  Weekly, as needed, for itch and flaking   fluticasone (FLONASE) 50 MCG/ACT nasal spray SPRAY 2 SPRAYS INTO EACH NOSTRIL EVERY DAY   ketoconazole (NIZORAL) 2 % cream APPLY 1 APPLICATION TOPICALLY AS NEEDED FOR DERMATITIS   ketoconazole (NIZORAL) 2 % shampoo APPLY TO SCALP FOR 5 MINUTES AND RINSE OUT   levocetirizine (XYZAL) 5 MG tablet TAKE 1 TABLET BY MOUTH EVERY DAY IN THE EVENING   montelukast (SINGULAIR) 10 MG tablet TAKE 1 TABLET BY MOUTH EVERY DAY   nystatin (MYCOSTATIN/NYSTOP) powder APPLY TO AFFECTED AREA TWICE A DAY AS NEEDED   omeprazole (PRILOSEC) 20 MG capsule THE PATIENT HAS TROUBLE SWALLOWING. PLEASE TAKE 2 CAPSULES TWICE A DAY BY MOUTH   ondansetron (ZOFRAN) 4 MG tablet Take one tablet by mouth once daily , as needed, for nausea   Respiratory Therapy Supplies (NEBULIZER/ADULT MASK) KIT Insurance preference   Respiratory Therapy Supplies (NEBULIZER/TUBING/MOUTHPIECE) KIT Use as directed. Insurance preference. Nebulizer kit and mask   sertraline (ZOLOFT) 50 MG tablet TAKE 1 TABLET BY MOUTH EVERY DAY   Spacer/Aero-Holding Chambers (AEROCHAMBER PLUS) inhaler Use as instructed   SYMBICORT 160-4.5 MCG/ACT inhaler Inhale 2 puffs into the lungs in the morning and at bedtime.   UNABLE TO FIND Gloves- 1 box per month as needed   UNABLE TO FIND Under pads- for use daily as needed   UNABLE TO FIND Incontinence liners- use as needed for incontinence Gloves- 1 box as needed for incontinence DX urinary incontinence   UNABLE TO FIND Nebulizer machine and tubing  DX J45.40   [DISCONTINUED] Calcium Carbonate (CALCIUM 500 PO) Take 1 tablet by mouth daily.    No facility-administered encounter medications on file as of 04/09/2021.     Scribe for Treatment Team: Lubertha South, LCSW
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
## 2021-04-10 ENCOUNTER — Ambulatory Visit (INDEPENDENT_AMBULATORY_CARE_PROVIDER_SITE_OTHER): Payer: Medicare Other | Admitting: Licensed Clinical Social Worker

## 2021-04-10 ENCOUNTER — Other Ambulatory Visit: Payer: Self-pay

## 2021-04-10 DIAGNOSIS — F411 Generalized anxiety disorder: Secondary | ICD-10-CM

## 2021-04-10 DIAGNOSIS — F324 Major depressive disorder, single episode, in partial remission: Secondary | ICD-10-CM

## 2021-04-11 ENCOUNTER — Other Ambulatory Visit: Payer: Self-pay | Admitting: Family Medicine

## 2021-04-17 ENCOUNTER — Encounter: Payer: Self-pay | Admitting: Family Medicine

## 2021-04-18 ENCOUNTER — Other Ambulatory Visit: Payer: Self-pay

## 2021-04-18 DIAGNOSIS — L219 Seborrheic dermatitis, unspecified: Secondary | ICD-10-CM

## 2021-04-22 ENCOUNTER — Other Ambulatory Visit: Payer: Self-pay

## 2021-04-22 ENCOUNTER — Ambulatory Visit (INDEPENDENT_AMBULATORY_CARE_PROVIDER_SITE_OTHER): Payer: Medicare Other | Admitting: Psychiatry

## 2021-04-22 DIAGNOSIS — F411 Generalized anxiety disorder: Secondary | ICD-10-CM

## 2021-04-22 NOTE — Progress Notes (Signed)
Virtual Visit via Telephone Note  I connected with Janet Acevedo on 04/22/21 at 2:03 PM EDT  by telephone and verified that I am speaking with the correct person using two identifiers.  Location: Patient: Home Provider: [place] office    I discussed the limitations, risks, security and privacy concerns of performing an evaluation and management service by telephone and the availability of in person appointments. I also discussed with the patient that there may be a patient responsible charge related to this service. The patient expressed understanding and agreed to proceed.   I provided 38 minutes of non-face-to-face time during this encounter.   [name], LCSW                       THERAPIST PROGRESS NOTE     Session Time:  Tuesday  04/22/2021 2:03 PM - 2:41 PM   Participation Level: Active  Behavioral Response: CasualAlert/ less anxious  Type of Therapy: Individual Therapy      Treatment Goals addressed: Patient wants to improve coping skills for anxiety, sustain her happiness without being dependent on relationship with others, improve self-esteem/self-worth, learn to speak up for self learn and implement cognitive and behavioral strategies to cope with anxiety  Interventions: CBT and Supportive   Summary: Janet Acevedo is a 30 y.o. female who is referred for services by psychiatrist Dr. Modesta Messing due to patient experiencing symptoms of anxiety. She denies any psychiatric hospitalizations and no previous involvement in psychotherapy.  patient reports having uncontrollable anxiety and finding herself in an anxious state every day. She reports having anxiety since childhood but has worsened in the last 1-2 years. She reports stress regarding an aunt who is a quadriplegic as her health has fluctuated. Patient reports being very close with his aunt. She also reports dealing with her sexuality and says she has had a girlfriend secretly for the past 8  years. She says she used to be okay with this but now being tired of hiding it. She also worries about her brother who is in prison and openly gay. Patient's reported symptoms of anxiety include "sweaty palms, hands tremble alot, voice is shakey, emotional outbursts of crying, increased heart rate".  Patient last was seen about 5-6 weeks ago.  She reports increased stress and anxiety regarding family health issues as her great aunt suffered 2 strokes and congestive heart failure.  She remains in the hospital.  Patient and her mother are now providing care for the aunt's 73 year old son who has physical and intellectual disabilities.  Patient reports adjustment issues regarding cousin now living with her and her mother.  She also reports stress helping mother manage aunt's affairs.  Patient also reports stress related to her sister who recently relapsed and used drugs.  However, her sister is getting help and is in a detox program per patient's report.  Although patient reports feeling overwhelmed at times,she is very pleased with the way she has managed stress/anxiety.   She states she is managing it much better than she thought she would.  She has been talking to a friend, reading, going outside, finding new recipes, cooking, and scheduling time for self.  Patient reports increased desire to learn more about her sexuality as well as explore the idea of autism spectrum disorder as she has researched and believes she has symptoms.  She has been referred to Royal Piedra, LCSW by PCP and has had at least 1 session.   Patient reports  she has been taking prescribed psychotropic medication as prescribed by PCP but still expresses desire to see psychiatrist Dr. Modesta Messing for medication evaluation.  suicidal/Homicidal: Nowithout intent/plan  Therapist Response:  Reviewed symptoms, discussed stressors, facilitated expression of thoughts and feelings, validated feelings, praised and reinforced patient's use of helpful  coping strategies, develop plan with patient to try to use strategies consistently, also encouraged patient to utilize previously mailed activity menu to assist her efforts to remain involved in activities, discussed treatment options, provided patient with contact information for therapist Carol Ada, [facility] to explore issues related to sexuality, also discussed patient continuing work with Royal Piedra and discontinuing treatment with this clinician to avoid duplication of services, agreed patient will make a decision about treatment and inform therapist at next appointment, therapist also will contact [facility] to facilitate referral to psychiatrist Dr. Modesta Messing .   Plan: Return again in 2 weeks.  Diagnosis: Axis I: Generalized Anxiety Disorder    Axis II: Deferred    [name], LCSW 04/22/2021

## 2021-04-25 NOTE — BH Specialist Note (Signed)
[place] Follow Up Assessment  MRN: [id number] NAME: Janet Acevedo Date: 04/10/21  Start time: 2p  End time: 230p Total time: 30  Type of Contact: Follow up Call  Current concerns/stressors: overwhelmed  Screens/Assessment Tools:  PHQ9 SCORE ONLY 04/02/2021 02/25/2021 01/22/2021  PHQ-9 Total Score _0 GAD 7 : Generalized Anxiety Score 04/02/2021 02/25/2021 09/10/2020 06/12/2020  Nervous, Anxious, on Edge _1 Control/stop worrying _2 Worry too much - different things _3 Trouble relaxing 2 1 0 2  Restless 2 1 0 3  Easily annoyed or irritable 2 0 0 1  Afraid - awful might happen 2 0 0 0  Total GAD 7 Score _4 Anxiety Difficulty Somewhat difficult - Not difficult at all -      Functional Assessment:  Sleep: fair Appetite: fair Coping ability: overwhelmed Patient taking medications as prescribed:    Current medications:  Outpatient Encounter Medications as of 04/10/2021  Medication Sig   albuterol (ACCUNEB) 1.25 MG/3ML nebulizer solution Take 3 mLs (1.25 mg total) by nebulization every 4 (four) hours as needed. Dx J45.40   albuterol (PROVENTIL) (2.5 MG/3ML) 0.083% nebulizer solution Take 3 mLs (2.5 mg total) by nebulization every 6 (six) hours as needed for wheezing or shortness of breath.   albuterol (VENTOLIN HFA) 108 (90 Base) MCG/ACT inhaler Inhale 2 puffs into the lungs every 6 (six) hours as needed for wheezing or shortness of breath.   azelastine (ASTELIN) 0.1 % nasal spray Place 2 sprays into both nostrils 2 (two) times daily. Use in each nostril as directed   betamethasone dipropionate (DIPROLENE) 0.05 % cream APPLY TOPICALLY 2 (TWO) TIMES DAILY.   Biotin 10 MG TABS Take 1 tablet by mouth daily.   cromolyn (OPTICROM) 4 % ophthalmic solution PLACE 1 DROP INTO BOTH EYES FOUR TIMES DAILY   ergocalciferol (VITAMIN D2) 1.25 MG (50000 UT) capsule Take 1 capsule (50,000 Units total) by mouth once a week. One capsule once weekly    Fluocinolone Acetonide 0.01 % OIL Apply sparingly to scalp two times  Weekly, as needed, for itch and flaking   fluticasone (FLONASE) 50 MCG/ACT nasal spray SPRAY 2 SPRAYS INTO EACH NOSTRIL EVERY DAY   ketoconazole (NIZORAL) 2 % cream APPLY 1 APPLICATION TOPICALLY AS NEEDED FOR DERMATITIS   ketoconazole (NIZORAL) 2 % shampoo APPLY TO SCALP FOR 5 MINUTES AND RINSE OUT   levocetirizine (XYZAL) 5 MG tablet TAKE 1 TABLET BY MOUTH EVERY DAY IN THE EVENING   montelukast (SINGULAIR) 10 MG tablet TAKE 1 TABLET BY MOUTH EVERY DAY   nystatin (MYCOSTATIN/NYSTOP) powder APPLY TO AFFECTED AREA TWICE A DAY AS NEEDED   omeprazole (PRILOSEC) 20 MG capsule THE PATIENT HAS TROUBLE SWALLOWING. PLEASE TAKE 2 CAPSULES TWICE A DAY BY MOUTH   ondansetron (ZOFRAN) 4 MG tablet Take one tablet by mouth once daily , as needed, for nausea   Respiratory Therapy Supplies (NEBULIZER/ADULT MASK) KIT Insurance preference   Respiratory Therapy Supplies (NEBULIZER/TUBING/MOUTHPIECE) KIT Use as directed. Insurance preference. Nebulizer kit and mask   sertraline (ZOLOFT) 50 MG tablet TAKE 1 TABLET BY MOUTH EVERY DAY   Spacer/Aero-Holding Chambers (AEROCHAMBER PLUS) inhaler Use as instructed   SYMBICORT 160-4.5 MCG/ACT inhaler Inhale 2 puffs into the lungs in the morning and at bedtime.   UNABLE TO FIND Gloves- 1 box per month as needed   UNABLE TO FIND Under pads- for use daily as  needed   UNABLE TO FIND Incontinence liners- use as needed for incontinence Gloves- 1 box as needed for incontinence DX urinary incontinence   UNABLE TO FIND Nebulizer machine and tubing  DX J45.40   [DISCONTINUED] busPIRone (BUSPAR) 7.5 MG tablet Take one tablet by mouth at 7 am , and at 10 pm, you may take half tablet for anxiety at 2 pm   No facility-administered encounter medications on file as of 04/10/2021.    Self-harm and/or Suicidal Behaviors Risk Assessment Self-harm risk factors: no Patient endorses recent self injurious thoughts and/or  behaviors: No   Suicide ideations: No plan to harm self or others   Danger to Others Risk Assessment Danger to others risk factors: no Patient endorses recent thoughts of harming others: No    Substance Use Assessment Patient recently consumed alcohol: No  Patient recently used drugs: No  Patient is concerned about dependence or abuse of substances: No    Goals, Interventions and Follow-up Plan Goals: Increase healthy adjustment to current life circumstances Interventions: Mindfulness or Relaxation Training and CBT Cognitive Behavioral Therapy   Summary of Clinical Assessment  Janet Acevedo is a 30 yr old that present for follow up.  Janet Acevedo reports that she feels overwhelmed in the home.  She states that she does not have any time to and for herself as they have family in the home regularly.  Janet Acevedo reports continued anxiousness, thoughts of past relationship (bad thoughts), odd dreams and sadness.  Provided psychoeducation. Factors that contribute to client's ongoing depressive symptoms were discussed and include real and perceived feelings of isolation, criticism, rejection, shame and guilt.    Follow-up Plan:  biweekly VBH sessions Janet South, LCSW

## 2021-04-25 NOTE — BH Specialist Note (Signed)
[place] Initial TeleMedicine Clinical Assessment  MRN: [id number] NAME: Janet Acevedo Date: 04/25/21  Start time: 1130 End time: 12 Total time: 30  Types of Service: Video visit Referring Provider: Dr. Moshe Cipro Reason for Visit today: begin [facility] session  Patient/Family location: home [place] Provider location: home office All persons participating in visit: Patient & therapist  I connected with patient and/or family via Telephone or Video Enabled Telemedicine Application  (Video is Caregility application) and verified that I am speaking with the correct person using two identifiers.   Discussed confidentiality: Yes   I discussed the limitations of telemedicine and the availability of in person appointments.  Discussed there is a possibility of technology failure and discussed alternative modes of communication if that failure occurs.  I discussed that engaging in this telemedicine visit, they consent to the provision of behavioral healthcare and the services will be billed under their insurance.  Patient and/or legal guardian expressed understanding and consented to Telemedicine visit: Yes   Treatment History Patient recently received Inpatient Treatment: No  Patient currently being seen by therapist/psychiatrist: Yes  Patient currently receiving the following services: OPT  Past Psychiatric History/Diagnosis/Hospitalization(s): Anxiety: Yes Bipolar Disorder: No Depression: No Mania: No Psychosis: No Schizophrenia: No Personality Disorder: No Hospitalization for psychiatric illness: No History of Electroconvulsive Shock Therapy: No Prior Suicide Attempts: No  Decreased need for sleep: Yes  Euphoria: No  Self Injurious behaviors: No  Family History of mental illness: No  Family History of substance abuse: No  Substance Abuse: No  DUI: No  Insomnia: No   History of violence: No  Physical, sexual or emotional abuse: No  Prior outpatient mental  health therapy: Yes   Social maturity: WNL Social judgement: WNL  Stress Current stressors: past trauma Familial stressors: overcrowded home Sleep: fair Appetite: fair Coping ability: overwhelmed Patient taking medications as prescribed:    Current medications:  Outpatient Encounter Medications as of 04/02/2021  Medication Sig   albuterol (ACCUNEB) 1.25 MG/3ML nebulizer solution Take 3 mLs (1.25 mg total) by nebulization every 4 (four) hours as needed. Dx J45.40   albuterol (PROVENTIL) (2.5 MG/3ML) 0.083% nebulizer solution Take 3 mLs (2.5 mg total) by nebulization every 6 (six) hours as needed for wheezing or shortness of breath.   albuterol (VENTOLIN HFA) 108 (90 Base) MCG/ACT inhaler Inhale 2 puffs into the lungs every 6 (six) hours as needed for wheezing or shortness of breath.   azelastine (ASTELIN) 0.1 % nasal spray Place 2 sprays into both nostrils 2 (two) times daily. Use in each nostril as directed   betamethasone dipropionate (DIPROLENE) 0.05 % cream APPLY TOPICALLY 2 (TWO) TIMES DAILY.   Biotin 10 MG TABS Take 1 tablet by mouth daily.   cromolyn (OPTICROM) 4 % ophthalmic solution PLACE 1 DROP INTO BOTH EYES FOUR TIMES DAILY   ergocalciferol (VITAMIN D2) 1.25 MG (50000 UT) capsule Take 1 capsule (50,000 Units total) by mouth once a week. One capsule once weekly   Fluocinolone Acetonide 0.01 % OIL Apply sparingly to scalp two times  Weekly, as needed, for itch and flaking   fluticasone (FLONASE) 50 MCG/ACT nasal spray SPRAY 2 SPRAYS INTO EACH NOSTRIL EVERY DAY   ketoconazole (NIZORAL) 2 % cream APPLY 1 APPLICATION TOPICALLY AS NEEDED FOR DERMATITIS   ketoconazole (NIZORAL) 2 % shampoo APPLY TO SCALP FOR 5 MINUTES AND RINSE OUT   levocetirizine (XYZAL) 5 MG tablet TAKE 1 TABLET BY MOUTH EVERY DAY IN THE EVENING   montelukast (SINGULAIR) 10  MG tablet TAKE 1 TABLET BY MOUTH EVERY DAY   nystatin (MYCOSTATIN/NYSTOP) powder APPLY TO AFFECTED AREA TWICE A DAY AS NEEDED   omeprazole  (PRILOSEC) 20 MG capsule THE PATIENT HAS TROUBLE SWALLOWING. PLEASE TAKE 2 CAPSULES TWICE A DAY BY MOUTH   ondansetron (ZOFRAN) 4 MG tablet Take one tablet by mouth once daily , as needed, for nausea   Respiratory Therapy Supplies (NEBULIZER/ADULT MASK) KIT Insurance preference   Respiratory Therapy Supplies (NEBULIZER/TUBING/MOUTHPIECE) KIT Use as directed. Insurance preference. Nebulizer kit and mask   sertraline (ZOLOFT) 50 MG tablet TAKE 1 TABLET BY MOUTH EVERY DAY   Spacer/Aero-Holding Chambers (AEROCHAMBER PLUS) inhaler Use as instructed   SYMBICORT 160-4.5 MCG/ACT inhaler Inhale 2 puffs into the lungs in the morning and at bedtime.   UNABLE TO FIND Gloves- 1 box per month as needed   UNABLE TO FIND Under pads- for use daily as needed   UNABLE TO FIND Incontinence liners- use as needed for incontinence Gloves- 1 box as needed for incontinence DX urinary incontinence   UNABLE TO FIND Nebulizer machine and tubing  DX J45.40   [DISCONTINUED] busPIRone (BUSPAR) 7.5 MG tablet Take one tablet by mouth at 7 am , and at 10 pm, you may take half tablet for anxiety at 2 pm   No facility-administered encounter medications on file as of 04/02/2021.     Self-harm and/or Suicidal Behaviors Risk Assessment Self-harm risk factors: no Patient endorses recent self injurious thoughts and/or behaviors: No  Suicide ideations: No plan to harm self or others   Danger to Others Risk Assessment Danger to others risk factors: no Patient endorses recent thoughts of harming others: No    Substance Use Assessment Patient recently consumed alcohol: No  Patient recently used drugs: No  Patient is concerned about dependence or abuse of substances: No    Goals, Interventions and Follow-up Plan Goals: Increase healthy adjustment to current life circumstances Interventions: Mindfulness or Relaxation Training and Behavioral Activation Follow-up Plan:  biweekly VBH sessions  Summary of Clinical  Assessment Summary: Janet Acevedo is a 30 yr old woman referred by her PCP.  Janet Acevedo reports that she is in therapy at the [facility] office.  She reports that she wants to work on making progress with self esteem and testing to determine if she has Autism.  She reports that she has researched the symptoms and believes that she may have Autism.   Therapist met with Patient in an initial therapy session to assess current mood and to build rapport. Therapist engaged Patient in discussion about her life and what is going well for her. Therapist provided support for Patient as she shared details about her life, her current stressors, mood, coping skills, her past, and MH history. Therapist prompted Patient to discuss her support system and ways that she manages her daily stress, anger, and frustrations.     Janet South, LCSW

## 2021-05-02 DIAGNOSIS — Z736 Limitation of activities due to disability: Secondary | ICD-10-CM | POA: Diagnosis not present

## 2021-05-03 ENCOUNTER — Encounter: Payer: Self-pay | Admitting: Family Medicine

## 2021-05-06 ENCOUNTER — Other Ambulatory Visit: Payer: Self-pay

## 2021-05-06 ENCOUNTER — Ambulatory Visit (INDEPENDENT_AMBULATORY_CARE_PROVIDER_SITE_OTHER): Payer: Medicare Other | Admitting: Psychiatry

## 2021-05-06 DIAGNOSIS — F411 Generalized anxiety disorder: Secondary | ICD-10-CM

## 2021-05-06 NOTE — Progress Notes (Signed)
Virtual Visit via Video Note  I connected with Janet Acevedo on 05/06/21 at 2:15 PM EDT by a video enabled telemedicine application and verified that I am speaking with the correct person using two identifiers.  Location: Patient: Home Provider: [place] office    I discussed the limitations of evaluation and management by telemedicine and the availability of in person appointments. The patient expressed understanding and agreed to proceed.   I provided 32 minutes of non-face-to-face time during this encounter.   Janet Smoker, LCSW                     THERAPIST PROGRESS NOTE     Session Time:  Tuesday  05/06/2021 2:15 PM - 2:47 PM  Participation Level: Active  Behavioral Response: CasualAlert/ less anxious  Type of Therapy: Individual Therapy      Treatment Goals addressed: Patient wants to improve coping skills for anxiety, sustain her happiness without being dependent on relationship with others, improve self-esteem/self-worth, learn to speak up for self learn and implement cognitive and behavioral strategies to cope with anxiety  Interventions: CBT and Supportive   Summary: Janet Acevedo is a 30 y.o. adult who is referred for services by psychiatrist Dr. Modesta Messing due to patient experiencing symptoms of anxiety. She denies any psychiatric hospitalizations and no previous involvement in psychotherapy.  patient reports having uncontrollable anxiety and finding herself in an anxious state every day. She reports having anxiety since childhood but has worsened in the last 1-2 years. She reports stress regarding an aunt who is a quadriplegic as her health has fluctuated. Patient reports being very close with his aunt. She also reports dealing with her sexuality and says she has had a girlfriend secretly for the past 8 years. She says she used to be okay with this but now being tired of hiding it. She also worries about her brother who is in prison and openly gay.  Patient's reported symptoms of anxiety include "sweaty palms, hands tremble alot, voice is shakey, emotional outbursts of crying, increased heart rate".  Patient last was seen about 2 weeks ago.  She reports improvement in mood and decreased anxiety since last session.  She reports less worry about her grades are not who now is in a skilled nursing facility closer to patient's home.  Per her report, they now do FaceTime with her great aunt and states she is doing better.  Patient reports positive adjustment to her aunts 41 year old son residing with patient and her mother.  She also denies any worry about her sister and expresses acceptance of the limits of her responsibility regarding her sister.  Patient expresses some sadness triggered by changing seasons/weather but is being proactive and planning/scheduling activities.  She maintains involvement in breathing, cooking, talking to friends, and now is pursuing adult coloring.  She expresses some concern about her health as she has been experiencing elevated blood pressure.  However, she expresses determination to change her habits especially regarding soda and sodium intake.  Patient expresses confidence in her ability to do this and has realistic expectations of self regarding her efforts.  She also is excited about recently being approved for a new medication for SMA and hopes to begin taking the medication in the near future.  Patient is very pleased about her progress in treatment.  She now wants to pursue learning more about her sexuality as well as other issues.  She followed up with Janet Piedra, LCSW and will begin  treatment with her later this week.    suicidal/Homicidal: Nowithout intent/plan  Therapist Response:  Reviewed symptoms, discussed stressors, facilitated expression of thoughts and feelings, validated feelings, praised and reinforced patient's use of helpful coping strategies, therapist and patient agreed to terminate services with this  clinician at this time as patient will be working with Janet Piedra, LCSW, processed patient's feelings regarding changing therapists, encouraged patient to review information regarding coping strategies regularly to assist patient in using coping strategies consistently, also informed patient psychiatrist Dr. Modesta Messing now is working in [place] and has not been accepting patients from this office, encouraged patient to talk with PCP about possible referral to a psychiatrist,    Plan: Return again in 2 weeks.  Diagnosis: Axis I: Generalized Anxiety Disorder    Axis II: Deferred    Janet Smoker, LCSW 05/06/2021    Outpatient Therapist Discharge Summary  Janet Acevedo    Dec 25, 1990   Admission Date: 12/11/2017 Discharge Date:  05/06/2021 Reason for Discharge:  Pt has decided to pursue treatment on her sexuality and other issues with another therapist Diagnosis:  Axis I:  GAD (generalized anxiety disorder)   Comments:    Janet Smoker LCSW

## 2021-05-07 ENCOUNTER — Other Ambulatory Visit: Payer: Self-pay

## 2021-05-07 ENCOUNTER — Encounter: Payer: Self-pay | Admitting: Family Medicine

## 2021-05-07 ENCOUNTER — Ambulatory Visit (INDEPENDENT_AMBULATORY_CARE_PROVIDER_SITE_OTHER): Payer: Medicare Other | Admitting: Family Medicine

## 2021-05-07 VITALS — BP 144/105

## 2021-05-07 DIAGNOSIS — E785 Hyperlipidemia, unspecified: Secondary | ICD-10-CM | POA: Diagnosis not present

## 2021-05-07 DIAGNOSIS — Z01419 Encounter for gynecological examination (general) (routine) without abnormal findings: Secondary | ICD-10-CM

## 2021-05-07 DIAGNOSIS — F411 Generalized anxiety disorder: Secondary | ICD-10-CM | POA: Diagnosis not present

## 2021-05-07 DIAGNOSIS — J4541 Moderate persistent asthma with (acute) exacerbation: Secondary | ICD-10-CM | POA: Diagnosis not present

## 2021-05-07 DIAGNOSIS — I10 Essential (primary) hypertension: Secondary | ICD-10-CM | POA: Insufficient documentation

## 2021-05-07 DIAGNOSIS — E669 Obesity, unspecified: Secondary | ICD-10-CM

## 2021-05-07 DIAGNOSIS — Z23 Encounter for immunization: Secondary | ICD-10-CM

## 2021-05-07 DIAGNOSIS — R1314 Dysphagia, pharyngoesophageal phase: Secondary | ICD-10-CM | POA: Diagnosis not present

## 2021-05-07 DIAGNOSIS — F321 Major depressive disorder, single episode, moderate: Secondary | ICD-10-CM

## 2021-05-07 HISTORY — DX: Essential (primary) hypertension: I10

## 2021-05-07 MED ORDER — ROSUVASTATIN CALCIUM 5 MG PO TABS: 5.0000 mg | ORAL_TABLET | Freq: Every day | ORAL | 3 refills | Status: DC

## 2021-05-07 MED ORDER — OLMESARTAN MEDOXOMIL 20 MG PO TABS: 20.0000 mg | ORAL_TABLET | Freq: Every day | ORAL | 3 refills | Status: DC

## 2021-05-07 NOTE — Patient Instructions (Signed)
Follow-up in 5 weeks reevaluate blood pressure call if you need me before.  New medication olmesartan 20 mg 1 daily to be started for blood pressure.  New medication Crestor 5 mg 1 daily to be started for high cholesterol.  You are being referred to nutritionist because of obesity hypertension dyslipidemia and difficulty swallowing.  You are referred to gynecology for Pap smear.  Flu vaccine in office today.  Please schedule and get your COVID vaccine.  You are referred to psychiatry for management of anxiety and depression.  Nurse please send message back to dermatology who she has been referred to with and April appointment let them know that she will keep that appointment.  Thanks for choosing [facility], we consider it a privelige to serve you.

## 2021-05-08 ENCOUNTER — Encounter: Payer: Self-pay | Admitting: Family Medicine

## 2021-05-08 NOTE — Progress Notes (Signed)
   Janet Acevedo     MRN: [id number]      DOB: 1990-10-09   HPI   Clonch is here for follow up and re-evaluation of chronic medical conditions, medication management and review of any available recent lab and radiology data.  Preventive health is updated, specifically  Cancer screening and Immunization.   Recently at Neurology clinic where her BP was noted to be markedly elevated, since then she has been checking it at her  home and getting igh numbers She is to start new daily oral med for her muscle disease and is excited about this. Cholesterol noted to be high recently, now motivated to get nutritional advice and take med to lower this also help with food choice as she has swallowing difficulty Has started with a new therapist and is seeing how this goes  ROS Denies recent fever or chills. Denies sinus pressure, nasal congestion, ear pain or sore throat. Denies chest congestion, productive cough or wheezing. Denies chest pains, palpitations and leg swelling Denies abdominal pain, nausea, vomiting,diarrhea or constipation.   Denies dysuria, frequency, hesitancy or incontinence. Chronic  limitation in mobility. Denies headaches, seizures, numbness, or tingling. C/o  depression and anxiety awaiting Psych appt. Denies skin break down or rash.   PE  BP (!) 144/105   Patient alert and oriented and in no cardiopulmonary distress.  HEENT: No facial asymmetry, EOMI,     Neck decreased ROM.  Chest: Clear to auscultation bilaterally.  CVS: S1, S2 no murmurs, no S3.Regular rate.  ABD: Soft non tender.   Ext: No edema  MS: decreased  ROM spine, shoulders, hips and knees.   Psych: Good eye contact, normal affect. Memory intact not anxious or depressed appearing.  CNS: CN 2-12 intact, power,  normal throughout.no focal deficits noted.   Assessment & Plan  Hypertension Start benicar 20 mg daily right ear ache eval in 5 weeks  Dyslipidemia Hyperlipidemia:Low fat diet discussed  and encouraged.   Lipid Panel  Lab Results  Component Value Date   CHOL 197 02/26/2021   HDL 38 (L) 02/26/2021   LDLCALC 142 (H) 02/26/2021   TRIG 93 02/26/2021   CHOLHDL 5.2 (H) 02/26/2021     Start crestor 5 mg, nutrition referral  Depression, major, single episode, moderate ([place]) Needs to be treated by psych, seeing a new therapist, will refer back to Psych who has treated her in the past  Dysphagia Nutrition referral for help with food choice  GAD (generalized anxiety disorder) Needs psychiatry to manage, refer back to Psych who has treated her in the past  Moderate persistent asthma Controlled, no change in medication

## 2021-05-08 NOTE — Assessment & Plan Note (Signed)
Needs psychiatry to manage, refer back to Psych who has treated her in the past

## 2021-05-08 NOTE — Assessment & Plan Note (Signed)
Controlled, no change in medication  

## 2021-05-08 NOTE — Assessment & Plan Note (Signed)
Needs to be treated by psych, seeing a new therapist, will refer back to Psych who has treated her in the past

## 2021-05-08 NOTE — Assessment & Plan Note (Signed)
Nutrition referral for help with food choice

## 2021-05-08 NOTE — Assessment & Plan Note (Signed)
Hyperlipidemia:Low fat diet discussed and encouraged.   Lipid Panel  Lab Results  Component Value Date   CHOL 197 02/26/2021   HDL 38 (L) 02/26/2021   LDLCALC 142 (H) 02/26/2021   TRIG 93 02/26/2021   CHOLHDL 5.2 (H) 02/26/2021     Start crestor 5 mg, nutrition referral

## 2021-05-08 NOTE — Assessment & Plan Note (Signed)
Start benicar 20 mg daily right ear ache eval in 5 weeks

## 2021-05-09 ENCOUNTER — Encounter: Payer: Self-pay | Admitting: Family Medicine

## 2021-05-09 ENCOUNTER — Encounter (HOSPITAL_COMMUNITY): Payer: Self-pay | Admitting: *Deleted

## 2021-05-09 ENCOUNTER — Other Ambulatory Visit: Payer: Self-pay

## 2021-05-09 ENCOUNTER — Emergency Department (HOSPITAL_COMMUNITY): Payer: Medicare Other

## 2021-05-09 ENCOUNTER — Emergency Department (HOSPITAL_COMMUNITY)
Admission: EM | Admit: 2021-05-09 | Discharge: 2021-05-09 | Disposition: A | Discharge status: Home / Self Care | Payer: Medicare Other | Attending: Emergency Medicine | Admitting: Emergency Medicine

## 2021-05-09 DIAGNOSIS — J4540 Moderate persistent asthma, uncomplicated: Secondary | ICD-10-CM | POA: Diagnosis not present

## 2021-05-09 DIAGNOSIS — Z79899 Other long term (current) drug therapy: Secondary | ICD-10-CM | POA: Insufficient documentation

## 2021-05-09 DIAGNOSIS — I10 Essential (primary) hypertension: Secondary | ICD-10-CM | POA: Insufficient documentation

## 2021-05-09 DIAGNOSIS — R000 Tachycardia, unspecified: Secondary | ICD-10-CM | POA: Diagnosis not present

## 2021-05-09 DIAGNOSIS — Z7951 Long term (current) use of inhaled steroids: Secondary | ICD-10-CM | POA: Insufficient documentation

## 2021-05-09 DIAGNOSIS — R002 Palpitations: Secondary | ICD-10-CM | POA: Diagnosis not present

## 2021-05-09 LAB — I-STAT BETA HCG BLOOD, ED (MC, WL, AP ONLY): I-stat hCG, quantitative: <5 m[IU]/mL (ref <5)

## 2021-05-09 LAB — TROPONIN I (HIGH SENSITIVITY): Troponin I (High Sensitivity): 2 ng/L (ref <18)

## 2021-05-09 LAB — CBC
HCT: 41.2 % (ref 36.0–46.0)
Hemoglobin: 13.2 g/dL (ref 12.0–15.0)
MCH: 28.1 pg (ref 26.0–34.0)
MCHC: 32 g/dL (ref 30.0–36.0)
MCV: 87.7 fL (ref 80.0–100.0)
Platelets: 266 10*3/uL (ref 150–400)
RBC: 4.7 MIL/uL (ref 3.87–5.11)
RDW: 14.7 % (ref 11.5–15.5)
WBC: 11.1 10*3/uL — ABNORMAL HIGH (ref 4.0–10.5)
nRBC: 0 % (ref 0.0–0.2)

## 2021-05-09 LAB — BASIC METABOLIC PANEL
Anion gap: 9 (ref 5–15)
BUN: 9 mg/dL (ref 6–20)
CO2: 26 mmol/L (ref 22–32)
Calcium: 8.8 mg/dL — ABNORMAL LOW (ref 8.9–10.3)
Chloride: 101 mmol/L (ref 98–111)
Creatinine, Ser: <0.3 mg/dL — ABNORMAL LOW (ref 0.44–1.00)
Glucose, Bld: 73 mg/dL (ref 70–99)
Potassium: 3.9 mmol/L (ref 3.5–5.1)
Sodium: 136 mmol/L (ref 135–145)

## 2021-05-09 LAB — D-DIMER, QUANTITATIVE: D-Dimer, Quant: 0.49 ug/mL-FEU (ref 0.00–0.50)

## 2021-05-09 NOTE — ED Triage Notes (Signed)
Irregular heart rate  after starting on new medication a couple of days ago

## 2021-05-09 NOTE — Discharge Instructions (Addendum)
Call your primary care doctor or specialist as discussed in the next 2-3 days.   Return immediately back to the ER if:  Your symptoms worsen within the next 12-24 hours. You develop new symptoms such as new fevers, persistent vomiting, new pain, shortness of breath, or new weakness or numbness, or if you have any other concerns.  

## 2021-05-09 NOTE — ED Provider Notes (Signed)
[facility] EMERGENCY DEPARTMENT Provider Note   CSN: [id number] Arrival date & time: 05/09/21  1802     History Chief Complaint  Patient presents with  . Palpitations    Janet Acevedo is a 30 y.o. adult.  Patient presents to ER chief complaint of palpitations.  She states that she feels irregular heartbeat at home and going on all day today and yesterday.  She went to urgent care and they told her it might be due to abnormal potassium and she was told to go to the ER.  She otherwise denies any headache or chest pain.  Denies fevers or cough or vomiting or diarrhea.  She has muscular dystrophy and is wheelchair-bound at baseline.      Past Medical History:  Diagnosis Date  . Allergy   . Anxiety   . Asthma   . Depression, major, single episode, moderate ([place]) 09/05/2017  . GERD (gastroesophageal reflux disease)   . Hypertension 05/07/2021  . Neuromuscular disorder ([place])   . Osteomyelitis [facility]) June 2011  . Otitis externa, eczematoid 05/28/2019  . Perennial allergic rhinitis   . Seborrheic dermatitis of scalp 2006  . Spinal muscle atrophy ([place])    type 2/3 18 months    Patient Active Problem List   Diagnosis Date Noted  . Hypertension 05/07/2021  . Vitamin D deficiency 03/03/2021  . Neck swelling 02/23/2020  . Tongue pain 02/23/2020  . Wheelchair dependent 01/03/2020  . Depression, major, single episode, moderate ([place]) 09/05/2017  . Dysphagia 08/24/2017  . GAD (generalized anxiety disorder) 02/08/2017  . Moderate persistent asthma 01/14/2017  . H/O spinal fusion 01/14/2017  . Osteopenia determined by x-ray 01/14/2017  . Dyslipidemia 09/28/2016  . Nausea 05/30/2015  . Seborrheic dermatitis of scalp 02/07/2015  . Urinary incontinence due to severe physical disability 02/12/2014  . DERMATOMYCOSIS 06/29/2010  . GERD 06/29/2010  . Type II spinal muscular atrophy ([place]) 02/24/2010  . Seasonal allergies 02/24/2010    Past Surgical History:  Procedure Laterality  Date  . bilateral hip reinsertion  under age 4   hips out of socket, pt was only able to cruise hold on and move short distances   . BONE BIOPSY     spinal bone   . MIRENA PLACED 10/27/10    . SPINAL FUSION  1999   rods placed in thoracolumbar spine to excessive scoliosis primarily   . SPINE SURGERY N/A    [employer]rees[employer] 07/29/2020     OB History   No obstetric history on file.     Family History  Problem Relation Age of Onset  . Hyperlipidemia Mother   . Hypertension Mother   . Hypertension Brother   . Diabetes Brother        borderline   . Anxiety disorder Brother   . Depression Brother   . Drug abuse Brother     Social History   Tobacco Use  . Smoking status: Never  . Smokeless tobacco: Never  Vaping Use  . Vaping Use: Never used  Substance Use Topics  . Alcohol use: No  . Drug use: No    Home Medications Prior to Admission medications   Medication Sig Start Date End Date Taking? Authorizing Provider  albuterol (ACCUNEB) 1.25 MG/3ML nebulizer solution Take 3 mLs (1.25 mg total) by nebulization every 4 (four) hours as needed. Dx J45.40 06/21/20   Evelina Dun A, FNP  albuterol (PROVENTIL) (2.5 MG/3ML) 0.083% nebulizer solution Take 3 mLs (2.5 mg total) by nebulization every 6 (six) hours  as needed for wheezing or shortness of breath. 06/21/20   Junie Spencer, FNP  albuterol (VENTOLIN HFA) 108 (90 Base) MCG/ACT inhaler Inhale 2 puffs into the lungs every 6 (six) hours as needed for wheezing or shortness of breath. 07/28/20   Jannifer Rodney A, FNP  azelastine (ASTELIN) 0.1 % nasal spray Place 2 sprays into both nostrils 2 (two) times daily. Use in each nostril as directed Patient not taking: Reported on 05/07/2021 06/12/20   Kerri Perches, MD  betamethasone dipropionate (DIPROLENE) 0.05 % cream APPLY TOPICALLY 2 (TWO) TIMES DAILY. 06/13/18   Kerri Perches, MD  Biotin 10 MG TABS Take 1 tablet by mouth daily. Patient not taking: Reported on 05/07/2021     [provider]  busPIRone (BUSPAR) 7.5 MG tablet TAKE ONE TABLET BY MOUTH AT 7 AM , AND AT 10 PM, YOU MAY TAKE HALF TABLET FOR ANXIETY AT 2 PM 04/11/21   Kerri Perches, MD  cromolyn (OPTICROM) 4 % ophthalmic solution PLACE 1 DROP INTO BOTH EYES FOUR TIMES DAILY 01/20/21   Kerri Perches, MD  ergocalciferol (VITAMIN D2) 1.25 MG (50000 UT) capsule Take 1 capsule (50,000 Units total) by mouth once a week. One capsule once weekly 02/27/21   Kerri Perches, MD  Fluocinolone Acetonide 0.01 % OIL Apply sparingly to scalp two times  Weekly, as needed, for itch and flaking 02/25/21   Kerri Perches, MD  fluticasone [facility]) 50 MCG/ACT nasal spray SPRAY 2 SPRAYS INTO EACH NOSTRIL EVERY DAY Patient not taking: Reported on 05/07/2021 09/03/20   Kerri Perches, MD  ketoconazole (NIZORAL) 2 % cream APPLY 1 APPLICATION TOPICALLY AS NEEDED FOR DERMATITIS 02/02/18   Kerri Perches, MD  ketoconazole (NIZORAL) 2 % shampoo APPLY TO SCALP FOR 5 MINUTES AND RINSE OUT 02/25/21   Kerri Perches, MD  levocetirizine (XYZAL) 5 MG tablet TAKE 1 TABLET BY MOUTH EVERY DAY IN THE EVENING 08/12/20   Kerri Perches, MD  montelukast (SINGULAIR) 10 MG tablet TAKE 1 TABLET BY MOUTH EVERY DAY 11/04/20   Kerri Perches, MD  nystatin (MYCOSTATIN/NYSTOP) powder APPLY TO AFFECTED AREA TWICE A DAY AS NEEDED 06/07/19   Kerri Perches, MD  olmesartan (BENICAR) 20 MG tablet Take 1 tablet (20 mg total) by mouth daily. 05/07/21   Kerri Perches, MD  omeprazole (PRILOSEC) 20 MG capsule THE PATIENT HAS TROUBLE SWALLOWING. PLEASE TAKE 2 CAPSULES TWICE A DAY BY MOUTH 09/30/20   Kerri Perches, MD  ondansetron [facility]) 4 MG tablet Take one tablet by mouth once daily , as needed, for nausea Patient not taking: Reported on 05/07/2021 08/29/19   Kerri Perches, MD  Respiratory Therapy Supplies (NEBULIZER/ADULT MASK) KIT Insurance preference 07/25/18   Kerri Perches, MD  Respiratory  Therapy Supplies (NEBULIZER/TUBING/MOUTHPIECE) KIT Use as directed. Insurance preference. Nebulizer kit and mask 07/25/20   Kerri Perches, MD  rosuvastatin (CRESTOR) 5 MG tablet Take 1 tablet (5 mg total) by mouth daily. 05/07/21   Kerri Perches, MD  sertraline (ZOLOFT) 50 MG tablet TAKE 1 TABLET BY MOUTH EVERY DAY 03/25/21   Kerri Perches, MD  Spacer/Aero-Holding Chambers (AEROCHAMBER PLUS) inhaler Use as instructed 10/04/15   Kerri Perches, MD  SYMBICORT 160-4.5 MCG/ACT inhaler Inhale 2 puffs into the lungs in the morning and at bedtime. 02/25/21   Kerri Perches, MD  UNABLE TO FIND Gloves- 1 box per month as needed 09/05/18   Kerri Perches, MD  UNABLE TO FIND Under pads- for use daily as needed 09/05/18   Fayrene Helper, MD  UNABLE TO FIND Incontinence liners- use as needed for incontinence Gloves- 1 box as needed for incontinence DX urinary incontinence 03/02/19   Fayrene Helper, MD  UNABLE TO FIND Nebulizer machine and tubing  DX J45.40 07/25/20   Fayrene Helper, MD  Calcium Carbonate (CALCIUM 500 PO) Take 1 tablet by mouth daily.   04/09/15  [provider]    Allergies    Other and Ciprofloxacin  Review of Systems   Review of Systems  Constitutional:  Negative for fever.  HENT:  Negative for ear pain.   Eyes:  Negative for pain.  Respiratory:  Negative for cough and shortness of breath.   Cardiovascular:  Positive for palpitations. Negative for chest pain.  Gastrointestinal:  Negative for abdominal pain.  Genitourinary:  Negative for flank pain.  Musculoskeletal:  Negative for back pain.  Skin:  Negative for rash.  Neurological:  Negative for headaches.   Physical Exam Updated Vital Signs BP (!) 139/118   Pulse 89   Temp 98.5 F (36.9 C) (Oral)   Resp 16   SpO2 98%   Physical Exam Constitutional:      General: KAREY SUTHERS is not in acute distress.    Appearance: Normal appearance.  HENT:     Head: Normocephalic.      Nose: Nose normal.  Eyes:     Extraocular Movements: Extraocular movements intact.  Cardiovascular:     Rate and Rhythm: Tachycardia present.  Pulmonary:     Effort: Pulmonary effort is normal.  Musculoskeletal:        General: Normal range of motion.     Cervical back: Normal range of motion.     Right lower leg: No edema.     Left lower leg: No edema.  Neurological:     General: No focal deficit present.     Mental Status: [name] is alert. Mental status is at baseline.    ED Results / Procedures / Treatments   Labs (all labs ordered are listed, but only abnormal results are displayed) Labs Reviewed  BASIC METABOLIC PANEL - Abnormal; Notable for the following components:      Result Value   Creatinine, Ser <0.30 (*)    Calcium 8.8 (*)    All other components within normal limits  CBC - Abnormal; Notable for the following components:   WBC 11.1 (*)    All other components within normal limits  D-DIMER, QUANTITATIVE  I-STAT BETA HCG BLOOD, ED ([facility], [facility], [facility] ONLY)  TROPONIN I (HIGH SENSITIVITY)    EKG EKG Interpretation  Date/Time:  Friday May 09 2021 18:45:05 EDT Ventricular Rate:  100 PR Interval:  136 QRS Duration: 70 QT Interval:  384 QTC Calculation: 495 R Axis:   64 Text Interpretation: Normal sinus rhythm Prolonged QT Abnormal ECG Confirmed by Thamas Jaegers ([id number]) on 05/09/2021 7:30:58 PM  Radiology DG Chest 2 View  Result Date: 05/09/2021 CLINICAL DATA:  Palpitations. EXAM: CHEST - 2 VIEW COMPARISON:  January 13, 2017 FINDINGS: Diffuse crowding of the bronchovascular lung markings is seen without evidence of focal consolidation, pleural effusion or pneumothorax. Mild prominence of the pulmonary vasculature is noted. The heart size and mediastinal contours are within normal limits. Chronic bell shaped configuration of the thorax is seen. Bilateral Harrington rods are seen along the length of the thoracic spine. IMPRESSION: No active cardiopulmonary  disease. Electronically Signed  By: Virgina Norfolk M.D.   On: 05/09/2021 19:32    Procedures Procedures   Medications Ordered in ED Medications - No data to display  ED Course  I have reviewed the triage vital signs and the nursing notes.  Pertinent labs & imaging results that were available during my care of the patient were reviewed by me and considered in my medical decision making (see chart for details).    MDM Rules/Calculators/A&P                           Patient's tachycardia appears to have resolved spontaneously in the ER.  Per the work-up otherwise negative troponin is negative potassium is normal D-dimer is negative as well.  She has no peripheral edema or lower leg swelling noted.  EKG shows sinus rhythm no ST elevations depressions no T wave inversions noted.  Risk and benefits of testing for pulmonary embolism discussed and decision made not to pursue this at this time.  Etiology of her transient palpitations are unclear.  Recommend outpatient follow-up with her doctor within the week.  Recommend immediate return for persistent or recurrent problems or any additional concerns.   Final Clinical Impression(s) / ED Diagnoses Final diagnoses:  Palpitations    Rx / DC Orders ED Discharge Orders     None        Luna Fuse, MD 05/09/21 2154

## 2021-05-12 ENCOUNTER — Other Ambulatory Visit: Payer: Self-pay | Admitting: Family Medicine

## 2021-05-12 MED ORDER — METOPROLOL SUCCINATE ER 25 MG PO TB24: 25.0000 mg | ORAL_TABLET | Freq: Every day | ORAL | 1 refills | Status: DC

## 2021-05-13 ENCOUNTER — Ambulatory Visit (INDEPENDENT_AMBULATORY_CARE_PROVIDER_SITE_OTHER): Payer: Medicare Other | Admitting: Licensed Clinical Social Worker

## 2021-05-13 ENCOUNTER — Other Ambulatory Visit: Payer: Self-pay

## 2021-05-13 DIAGNOSIS — F321 Major depressive disorder, single episode, moderate: Secondary | ICD-10-CM

## 2021-05-13 DIAGNOSIS — F411 Generalized anxiety disorder: Secondary | ICD-10-CM

## 2021-05-13 NOTE — Telephone Encounter (Signed)
Virtual appointment scheduled. 

## 2021-05-14 ENCOUNTER — Telehealth (INDEPENDENT_AMBULATORY_CARE_PROVIDER_SITE_OTHER): Payer: Medicare Other | Admitting: Licensed Clinical Social Worker

## 2021-05-14 ENCOUNTER — Ambulatory Visit (INDEPENDENT_AMBULATORY_CARE_PROVIDER_SITE_OTHER): Payer: Medicare Other | Admitting: Family Medicine

## 2021-05-14 DIAGNOSIS — R42 Dizziness and giddiness: Secondary | ICD-10-CM

## 2021-05-14 DIAGNOSIS — J4541 Moderate persistent asthma with (acute) exacerbation: Secondary | ICD-10-CM

## 2021-05-14 DIAGNOSIS — F411 Generalized anxiety disorder: Secondary | ICD-10-CM | POA: Diagnosis not present

## 2021-05-14 DIAGNOSIS — R002 Palpitations: Secondary | ICD-10-CM

## 2021-05-14 DIAGNOSIS — H938X9 Other specified disorders of ear, unspecified ear: Secondary | ICD-10-CM | POA: Diagnosis not present

## 2021-05-14 DIAGNOSIS — R0989 Other specified symptoms and signs involving the circulatory and respiratory systems: Secondary | ICD-10-CM

## 2021-05-14 DIAGNOSIS — I10 Essential (primary) hypertension: Secondary | ICD-10-CM | POA: Diagnosis not present

## 2021-05-14 DIAGNOSIS — F321 Major depressive disorder, single episode, moderate: Secondary | ICD-10-CM

## 2021-05-14 MED ORDER — BUSPIRONE HCL 7.5 MG PO TABS: 7.5000 mg | ORAL_TABLET | Freq: Three times a day (TID) | ORAL | 3 refills | Status: DC

## 2021-05-14 NOTE — Progress Notes (Signed)
Virtual Visit via Telephone Note  I connected with Janet Acevedo on 05/14/21 at  1:20 PM EDT by telephone and verified that I am speaking with the correct person using two identifiers.  Location: Patient: home Provider: office   I discussed the limitations, risks, security and privacy concerns of performing an evaluation and management service by telephone and the availability of in person appointments. I also discussed with the patient that there may be a patient responsible charge related to this service. The patient expressed understanding and agreed to proceed.   History of Present Illness: Still feels heart rate is irregular, fluttering, and has to catch her breath at times. This is a f/u from recent ED visit for adverse reaction to medication., and she has concerns, does not want amlodipine as afraid of leg swelling which she hs seen in a close family member C/o palpitations, whiuch she has ahd in the past , seems to be increased and she feels is linked wth her uncontrolled anxiety, still awaiting appt with psych, has established with new therapist C/o intermittent light headedness and hearing heartbeat in one ear   Observations/Objective: There were no vitals taken for this visit. Good communication with no confusion and intact memory. Alert and oriented x 3 No signs of respiratory distress during speech   Assessment and Plan: Hypertension DASH diet and commitment to daily physical activity for a minimum of 30 minutes discussed and encouraged, as a part of hypertension management. The importance of attaining a healthy weight is also discussed.  BP/Weight 05/09/2021 05/07/2021 02/23/2020 01/22/2020 01/03/2020 9/39/0300 03/28/3299  Systolic BP 762 263 335 456 - 256 389  Diastolic BP 373 428 78 78 - 78 78  Wt. (Lbs) - - 152 152 152 152 152  BMI - - 23.81 23.81 23.81 23.81 23.81  Some encounter information is confidential and restricted. Go to Review Flowsheets activity to see all  data.     uncontrolledand reporting increased palpitations, Cardiology to assist with mx  GAD (generalized anxiety disorder) Uncontrolled and severe, refer to Psychiatry, increase buspar to three times daily  Moderate persistent asthma Controlled, no change in medication   Palpitations Increased palpitations in setting of new hypertension, efer cardiology for eval and mx   Follow Up Instructions:    I discussed the assessment and treatment plan with the patient. The patient was provided an opportunity to ask questions and all were answered. The patient agreed with the plan and demonstrated an understanding of the instructions.   The patient was advised to call back or seek an in-person evaluation if the symptoms worsen or if the condition fails to improve as anticipated.  I provided 16 minutes of non-face-to-face time during this encounter.   Tula Nakayama, MD

## 2021-05-14 NOTE — Patient Instructions (Addendum)
F/U as before, call if you need me sooner  Increase buspar 7.5 mg to one tablet three times daily  Stay on same doe of blood pressure medication  We will follow up on Psychiatry appointment for you  You are referred to Cardiology re blood pressure and palpitations  You are referred for an US of the arteries in  your neck to ensure good blood flow and no blockage  Thanks for choosing [facility], we consider it a privelige to serve you.

## 2021-05-14 NOTE — BH Specialist Note (Signed)
[facility] Follow Up Assessment  MRN: [id number] NAME: Janet Acevedo Date: 05/14/21  Start time: 2p End time: 240p Total time: 40   Type of Contact: Follow up Call  Current concerns/stressors: overwhelmed  Screens/Assessment Tools:  GAD 7 : Generalized Anxiety Score 05/14/2021 04/02/2021 02/25/2021 09/10/2020  Nervous, Anxious, on Edge _0 Control/stop worrying _1 Worry too much - different things _2 Trouble relaxing _3 0  Restless _4 0  Easily annoyed or irritable 2 2 0 0  Afraid - awful might happen 3 2 0 0  Total GAD 7 Score _5 Anxiety Difficulty Extremely difficult Somewhat difficult - Not difficult at all      Functional Assessment:  Sleep: WNL Appetite: fair Coping ability: overwhelmed Patient taking medications as prescribed:  yes  Current medications:  Outpatient Encounter Medications as of 05/13/2021  Medication Sig   albuterol (ACCUNEB) 1.25 MG/3ML nebulizer solution Take 3 mLs (1.25 mg total) by nebulization every 4 (four) hours as needed. Dx J45.40   albuterol (PROVENTIL) (2.5 MG/3ML) 0.083% nebulizer solution Take 3 mLs (2.5 mg total) by nebulization every 6 (six) hours as needed for wheezing or shortness of breath.   albuterol (VENTOLIN HFA) 108 (90 Base) MCG/ACT inhaler Inhale 2 puffs into the lungs every 6 (six) hours as needed for wheezing or shortness of breath.   azelastine (ASTELIN) 0.1 % nasal spray Place 2 sprays into both nostrils 2 (two) times daily. Use in each nostril as directed (Patient not taking: No sig reported)   betamethasone dipropionate (DIPROLENE) 0.05 % cream APPLY TOPICALLY 2 (TWO) TIMES DAILY.   Biotin 10 MG TABS Take 1 tablet by mouth daily. (Patient not taking: No sig reported)   busPIRone (BUSPAR) 7.5 MG tablet TAKE ONE TABLET BY MOUTH AT 7 AM , AND AT 10 PM, YOU MAY TAKE HALF TABLET FOR ANXIETY AT 2 PM   cromolyn (OPTICROM) 4 % ophthalmic solution PLACE 1 DROP INTO BOTH EYES FOUR TIMES DAILY    ergocalciferol (VITAMIN D2) 1.25 MG (50000 UT) capsule Take 1 capsule (50,000 Units total) by mouth once a week. One capsule once weekly   Fluocinolone Acetonide 0.01 % OIL Apply sparingly to scalp two times  Weekly, as needed, for itch and flaking   fluticasone (FLONASE) 50 MCG/ACT nasal spray SPRAY 2 SPRAYS INTO EACH NOSTRIL EVERY DAY (Patient not taking: No sig reported)   ketoconazole (NIZORAL) 2 % cream APPLY 1 APPLICATION TOPICALLY AS NEEDED FOR DERMATITIS   ketoconazole (NIZORAL) 2 % shampoo APPLY TO SCALP FOR 5 MINUTES AND RINSE OUT   levocetirizine (XYZAL) 5 MG tablet TAKE 1 TABLET BY MOUTH EVERY DAY IN THE EVENING   metoprolol succinate (TOPROL-XL) 25 MG 24 hr tablet Take 1 tablet (25 mg total) by mouth daily.   montelukast (SINGULAIR) 10 MG tablet TAKE 1 TABLET BY MOUTH EVERY DAY   nystatin (MYCOSTATIN/NYSTOP) powder APPLY TO AFFECTED AREA TWICE A DAY AS NEEDED   olmesartan (BENICAR) 20 MG tablet Take 1 tablet (20 mg total) by mouth daily.   omeprazole (PRILOSEC) 20 MG capsule THE PATIENT HAS TROUBLE SWALLOWING. PLEASE TAKE 2 CAPSULES TWICE A DAY BY MOUTH   ondansetron (ZOFRAN) 4 MG tablet Take one tablet by mouth once daily , as needed, for nausea (Patient not taking: No sig reported)   Respiratory Therapy Supplies (NEBULIZER/ADULT MASK) KIT Insurance preference   Respiratory Therapy Supplies (NEBULIZER/TUBING/MOUTHPIECE) KIT  Use as directed. Insurance preference. Nebulizer kit and mask   rosuvastatin (CRESTOR) 5 MG tablet Take 1 tablet (5 mg total) by mouth daily.   sertraline (ZOLOFT) 50 MG tablet TAKE 1 TABLET BY MOUTH EVERY DAY   Spacer/Aero-Holding Chambers (AEROCHAMBER PLUS) inhaler Use as instructed   SYMBICORT 160-4.5 MCG/ACT inhaler Inhale 2 puffs into the lungs in the morning and at bedtime.   UNABLE TO FIND Gloves- 1 box per month as needed   UNABLE TO FIND Under pads- for use daily as needed   UNABLE TO FIND Incontinence liners- use as needed for incontinence Gloves- 1  box as needed for incontinence DX urinary incontinence   UNABLE TO FIND Nebulizer machine and tubing  DX J45.40   No facility-administered encounter medications on file as of 05/13/2021.    Self-harm and/or Suicidal Behaviors Risk Assessment Self-harm risk factors: no Patient endorses recent self injurious thoughts and/or behaviors: No   Suicide ideations: No plan to harm self or others   Danger to Others Risk Assessment Danger to others risk factors: no Patient endorses recent thoughts of harming others: No    Substance Use Assessment Patient recently consumed alcohol: No  Patient recently used drugs: No  Patient is concerned about dependence or abuse of substances: No    Goals, Interventions and Follow-up Plan Goals: Increase healthy adjustment to current life circumstances Interventions: Mindfulness or Relaxation Training, Behavioral Activation, and CBT Cognitive Behavioral Therapy   Summary of Clinical Assessment  Janet Acevedo is a 30 yr old who is a follow up Patient.  She reports that the past week has been stressful.  Her blood pressure has been higher than normal and she now is on a new medication.  She continues to have difficulty with around people and adhering to her daily routine. She is able to engage in enjoyable activities such as video games and has learned to reduce the amount of time that she plays.  In the past, she played all night but now spends no more than 3-4 hours. She is learning her triggers and how to communicate those to others (yelling).  She is excited about a new treatment that she will begin May 16 2021 for her medical DX   Follow-up Plan:  Weekly VBH session Nicole M Peacock, LCSW no 

## 2021-05-14 NOTE — BH Specialist Note (Signed)
Virtual Behavioral Health Treatment Plan Team Note  MRN: [id number] NAME: Janet Acevedo  DATE: 05/14/21  Start time:   335p End time:  340p Total time:  5min  Total number of Virtual BH Treatment Team Plan encounters: 2/4  Treatment Team Attendees: Nicole Peacock, LCSW & Dr. Hisada  Diagnoses: No diagnosis found.  Goals, Interventions and Follow-up Plan Goals: Increase healthy adjustment to current life circumstances Interventions: Mindfulness or Relaxation Training Behavioral Activation CBT Cognitive Behavioral Therapy Medication Management Recommendations: no change in her medication Follow-up Plan: Weekly VBH session  History of the present illness Presenting Problem/Current Symptoms: past trauma  Psychiatric History  Depression: Yes Anxiety: No Mania: No Psychosis: No PTSD symptoms: No  Past Psychiatric History/Hospitalization(s): Hospitalization for psychiatric illness: No Prior Suicide Attempts: No Prior Self-injurious behavior: No  Psychosocial stressors chaos in the home  Self-harm Behaviors Risk Assessment   Screenings PHQ-9 Assessments:  Depression screen PHQ 2/9 05/14/2021 05/07/2021 04/02/2021  Decreased Interest 0 0 3  Down, Depressed, Hopeless 0 0 3  PHQ - 2 Score 0 0 6  Altered sleeping 0 0 2  Tired, decreased energy 0 3 2  Change in appetite 0 0 1  Feeling bad or failure about yourself  0 0 1  Trouble concentrating 0 0 2  Moving slowly or fidgety/restless 0 0 2  Suicidal thoughts 0 0 0  PHQ-9 Score 0 3 16  Difficult doing work/chores - - Somewhat difficult  Some recent data might be hidden   GAD-7 Assessments:  GAD 7 : Generalized Anxiety Score 05/14/2021 04/02/2021 02/25/2021 09/10/2020  Nervous, Anxious, on Edge 3 2 2 1  Control/stop worrying 3 2 1 1  Worry too much - different things 3 2 2 1  Trouble relaxing 3 2 1 0  Restless 3 2 1 0  Easily annoyed or irritable 2 2 0 0  Afraid - awful might happen 3 2 0 0  Total GAD 7 Score 20 14 7  3  Anxiety Difficulty Extremely difficult Somewhat difficult - Not difficult at all    Past Medical History Past Medical History:  Diagnosis Date   Allergy    Anxiety    Asthma    Depression, major, single episode, moderate (HCC) 09/05/2017   GERD (gastroesophageal reflux disease)    Hypertension 05/07/2021   Neuromuscular disorder (HCC)    Osteomyelitis (HCC) June 2011   Otitis externa, eczematoid 05/28/2019   Perennial allergic rhinitis    Seborrheic dermatitis of scalp 2006   Spinal muscle atrophy (HCC)    type 2/3 18 months    Vital signs: There were no vitals filed for this visit.  Allergies:  Allergies as of 05/14/2021 - Review Complete 05/14/2021  Allergen Reaction Noted   Other Diarrhea 01/01/2020   Ciprofloxacin Nausea And Vomiting 05/29/2015    Medication History Current medications:  Outpatient Encounter Medications as of 05/14/2021  Medication Sig   albuterol (ACCUNEB) 1.25 MG/3ML nebulizer solution Take 3 mLs (1.25 mg total) by nebulization every 4 (four) hours as needed. Dx J45.40   albuterol (PROVENTIL) (2.5 MG/3ML) 0.083% nebulizer solution Take 3 mLs (2.5 mg total) by nebulization every 6 (six) hours as needed for wheezing or shortness of breath.   albuterol (VENTOLIN HFA) 108 (90 Base) MCG/ACT inhaler Inhale 2 puffs into the lungs every 6 (six) hours as needed for wheezing or shortness of breath.   azelastine (ASTELIN) 0.1 % nasal spray Place 2 sprays into both nostrils 2 (two) times daily. Use in each nostril   as directed (Patient not taking: No sig reported)   betamethasone dipropionate (DIPROLENE) 0.05 % cream APPLY TOPICALLY 2 (TWO) TIMES DAILY.   Biotin 10 MG TABS Take 1 tablet by mouth daily. (Patient not taking: No sig reported)   busPIRone (BUSPAR) 7.5 MG tablet TAKE ONE TABLET BY MOUTH AT 7 AM , AND AT 10 PM, YOU MAY TAKE HALF TABLET FOR ANXIETY AT 2 PM   busPIRone (BUSPAR) 7.5 MG tablet Take 1 tablet (7.5 mg total) by mouth 3 (three) times daily.    cromolyn (OPTICROM) 4 % ophthalmic solution PLACE 1 DROP INTO BOTH EYES FOUR TIMES DAILY   ergocalciferol (VITAMIN D2) 1.25 MG (50000 UT) capsule Take 1 capsule (50,000 Units total) by mouth once a week. One capsule once weekly   Fluocinolone Acetonide 0.01 % OIL Apply sparingly to scalp two times  Weekly, as needed, for itch and flaking   fluticasone (FLONASE) 50 MCG/ACT nasal spray SPRAY 2 SPRAYS INTO EACH NOSTRIL EVERY DAY (Patient not taking: No sig reported)   ketoconazole (NIZORAL) 2 % cream APPLY 1 APPLICATION TOPICALLY AS NEEDED FOR DERMATITIS   ketoconazole (NIZORAL) 2 % shampoo APPLY TO SCALP FOR 5 MINUTES AND RINSE OUT   levocetirizine (XYZAL) 5 MG tablet TAKE 1 TABLET BY MOUTH EVERY DAY IN THE EVENING   metoprolol succinate (TOPROL-XL) 25 MG 24 hr tablet Take 1 tablet (25 mg total) by mouth daily.   montelukast (SINGULAIR) 10 MG tablet TAKE 1 TABLET BY MOUTH EVERY DAY   nystatin (MYCOSTATIN/NYSTOP) powder APPLY TO AFFECTED AREA TWICE A DAY AS NEEDED   olmesartan (BENICAR) 20 MG tablet Take 1 tablet (20 mg total) by mouth daily.   omeprazole (PRILOSEC) 20 MG capsule THE PATIENT HAS TROUBLE SWALLOWING. PLEASE TAKE 2 CAPSULES TWICE A DAY BY MOUTH   ondansetron (ZOFRAN) 4 MG tablet Take one tablet by mouth once daily , as needed, for nausea (Patient not taking: No sig reported)   Respiratory Therapy Supplies (NEBULIZER/ADULT MASK) KIT Insurance preference   Respiratory Therapy Supplies (NEBULIZER/TUBING/MOUTHPIECE) KIT Use as directed. Insurance preference. Nebulizer kit and mask   rosuvastatin (CRESTOR) 5 MG tablet Take 1 tablet (5 mg total) by mouth daily.   sertraline (ZOLOFT) 50 MG tablet TAKE 1 TABLET BY MOUTH EVERY DAY   Spacer/Aero-Holding Chambers (AEROCHAMBER PLUS) inhaler Use as instructed   SYMBICORT 160-4.5 MCG/ACT inhaler Inhale 2 puffs into the lungs in the morning and at bedtime.   UNABLE TO FIND Gloves- 1 box per month as needed   UNABLE TO FIND Under pads- for use daily  as needed   UNABLE TO FIND Incontinence liners- use as needed for incontinence Gloves- 1 box as needed for incontinence DX urinary incontinence   UNABLE TO FIND Nebulizer machine and tubing  DX J45.40   [DISCONTINUED] Calcium Carbonate (CALCIUM 500 PO) Take 1 tablet by mouth daily.    No facility-administered encounter medications on file as of 05/14/2021.     Scribe for Treatment Team: Lubertha South, LCSW

## 2021-05-18 ENCOUNTER — Encounter: Payer: Self-pay | Admitting: Family Medicine

## 2021-05-18 DIAGNOSIS — H938X9 Other specified disorders of ear, unspecified ear: Secondary | ICD-10-CM | POA: Insufficient documentation

## 2021-05-18 DIAGNOSIS — R002 Palpitations: Secondary | ICD-10-CM | POA: Insufficient documentation

## 2021-05-18 NOTE — Assessment & Plan Note (Signed)
Uncontrolled and severe, refer to Psychiatry, increase buspar to three times daily

## 2021-05-18 NOTE — Assessment & Plan Note (Signed)
Controlled, no change in medication  

## 2021-05-18 NOTE — Assessment & Plan Note (Signed)
Increased palpitations in setting of new hypertension, efer cardiology for eval and mx

## 2021-05-18 NOTE — Assessment & Plan Note (Signed)
DASH diet and commitment to daily physical activity for a minimum of 30 minutes discussed and encouraged, as a part of hypertension management. The importance of attaining a healthy weight is also discussed.  BP/Weight 05/09/2021 05/07/2021 02/23/2020 01/22/2020 01/03/2020 4/37/0052 12/03/1026  Systolic BP 902 284 069 861 - 483 073  Diastolic BP 543 014 78 78 - 78 78  Wt. (Lbs) - - 152 152 152 152 152  BMI - - 23.81 23.81 23.81 23.81 23.81  Some encounter information is confidential and restricted. Go to Review Flowsheets activity to see all data.     uncontrolledand reporting increased palpitations, Cardiology to assist with mx

## 2021-05-19 ENCOUNTER — Other Ambulatory Visit: Payer: Self-pay | Admitting: Family

## 2021-05-20 ENCOUNTER — Other Ambulatory Visit: Payer: Self-pay

## 2021-05-20 ENCOUNTER — Other Ambulatory Visit: Payer: Self-pay | Admitting: *Deleted

## 2021-05-20 ENCOUNTER — Ambulatory Visit (HOSPITAL_COMMUNITY): Payer: Medicare Other

## 2021-05-20 ENCOUNTER — Telehealth: Payer: Self-pay

## 2021-05-20 ENCOUNTER — Encounter: Payer: Self-pay | Admitting: Family Medicine

## 2021-05-20 ENCOUNTER — Other Ambulatory Visit: Payer: Self-pay | Admitting: Family Medicine

## 2021-05-20 DIAGNOSIS — R6889 Other general symptoms and signs: Secondary | ICD-10-CM

## 2021-05-20 MED ORDER — ALBUTEROL SULFATE (2.5 MG/3ML) 0.083% IN NEBU: 2.5000 mg | INHALATION_SOLUTION | Freq: Four times a day (QID) | RESPIRATORY_TRACT | 1 refills | Status: DC | PRN

## 2021-05-20 MED ORDER — ALBUTEROL SULFATE HFA 108 (90 BASE) MCG/ACT IN AERS: 2.0000 | INHALATION_SPRAY | Freq: Four times a day (QID) | RESPIRATORY_TRACT | 3 refills | Status: DC | PRN

## 2021-05-20 NOTE — Telephone Encounter (Signed)
Patient called need med refill, said the pharmacy denied her refill  Albuterol inhaler  Pharmacy:  [facility]

## 2021-05-20 NOTE — Telephone Encounter (Signed)
Refill sent.

## 2021-05-21 ENCOUNTER — Other Ambulatory Visit: Payer: Self-pay

## 2021-05-21 ENCOUNTER — Encounter: Payer: Self-pay | Admitting: Family Medicine

## 2021-05-21 DIAGNOSIS — J4541 Moderate persistent asthma with (acute) exacerbation: Secondary | ICD-10-CM

## 2021-05-21 NOTE — Telephone Encounter (Signed)
Faxed order

## 2021-05-23 ENCOUNTER — Encounter: Payer: Medicare Other | Admitting: Family Medicine

## 2021-05-23 ENCOUNTER — Other Ambulatory Visit: Payer: Self-pay

## 2021-05-25 ENCOUNTER — Telehealth: Payer: Medicare Other | Admitting: Emergency Medicine

## 2021-05-25 DIAGNOSIS — R062 Wheezing: Secondary | ICD-10-CM | POA: Diagnosis not present

## 2021-05-25 DIAGNOSIS — J4541 Moderate persistent asthma with (acute) exacerbation: Secondary | ICD-10-CM | POA: Diagnosis not present

## 2021-05-25 MED ORDER — ALBUTEROL SULFATE HFA 108 (90 BASE) MCG/ACT IN AERS: 1.0000 | INHALATION_SPRAY | Freq: Four times a day (QID) | RESPIRATORY_TRACT | 0 refills | Status: DC | PRN

## 2021-05-25 MED ORDER — PREDNISONE 20 MG PO TABS: 20.0000 mg | ORAL_TABLET | Freq: Two times a day (BID) | ORAL | 0 refills | Status: AC

## 2021-05-25 NOTE — Progress Notes (Signed)
Visit for Asthma  Based on what you have shared with me, it looks like you may have a flare up of your asthma.  Asthma is a chronic (ongoing) lung disease which results in airway obstruction, inflammation and hyper-responsiveness.   Asthma symptoms vary from person to person, with common symptoms including nighttime awakening and decreased ability to participate in normal activities as a result of shortness of breath. It is often triggered by changes in weather, changes in the season, changes in air temperature, or inside (home, school, daycare or work) allergens such as animal dander, mold, mildew, woodstoves or cockroaches.   It can also be triggered by hormonal changes, extreme emotion, physical exertion or an upper respiratory tract illness.     It is important to identify the trigger, and then eliminate or avoid the trigger if possible.   If you have been prescribed medications to be taken on a regular basis, it is important to follow the asthma action plan and to follow guidelines to adjust medication in response to increasing symptoms of decreased peak expiratory flow rate  Treatment: I have prescribed: Albuterol (Proventil HFA; Ventolin HFA) 108 (90 Base) MCG/ACT Inhaler 2 puffs into the lungs every six hours as needed for wheezing or shortness of breath and Prednisone 40mg  by mouth per day for 5 - 7 days  If symptoms do not improve with above treatments over the next 12-24 hours please seek in person evaluation either at urgent care, with PCP or in the emergency room.    HOME CARE Only take medications as instructed by your medical team. Consider wearing a mask or scarf to improve breathing air temperature have been shown to decrease irritation and decrease exacerbations Get rest. Taking a steamy shower or using a humidifier may help nasal congestion sand ease sore throat pain.  You can place a towel over your head and breathe in the steam from hot water coming from a faucet. Using a saline nasal spray works much the same way.  Cough drops, hare candies and sore throat lozenges may ease your cough.  Avoid close contacts especially the very you and the elderly Cover your mouth if you cough or sneeze Always remember to wash your hands.    GET HELP RIGHT AWAY IF: You develop worsening symptoms; breathlessness at rest, drowsy, confused or agitated, unable to speak in full sentences You have coughing fits You develop a severe headache or visual changes You develop shortness of breath, difficulty breathing or start having chest pain Your symptoms persist after you have completed your treatment plan If your symptoms do not improve within 10 days  MAKE SURE YOU Understand these instructions. Will watch your condition. Will get help right away if you are not doing well or get worse.   Your e-visit answers were reviewed by a board certified advanced clinical practitioner to complete your personal care plan, Depending upon the condition, your plan could have included both over the counter or prescription medications.   Please review your pharmacy choice. Your safety is important to [place]. If you have drug allergies check your prescription carefully.  You can use MyChart to ask questions about today's visit, request a non-urgent  call back, or ask for a work or school excuse for 24 hours related to this e-Visit. If it has been greater than 24 hours you will need to follow up with your provider, or enter a new e-Visit to address those concerns.   You will get an e-mail in the next two  days asking about your experience. I hope that your e-visit has been valuable and will speed your recovery. Thank you for using e-visits.

## 2021-05-25 NOTE — Progress Notes (Signed)
I have spent 5 minutes in review of e-visit questionnaire, review and updating patient chart, medical decision making and response to patient.   Gearold Wainer, PA-C    

## 2021-05-27 DIAGNOSIS — J4541 Moderate persistent asthma with (acute) exacerbation: Secondary | ICD-10-CM | POA: Diagnosis not present

## 2021-05-27 DIAGNOSIS — J4540 Moderate persistent asthma, uncomplicated: Secondary | ICD-10-CM | POA: Diagnosis not present

## 2021-05-30 ENCOUNTER — Other Ambulatory Visit: Payer: Self-pay | Admitting: Family Medicine

## 2021-06-01 ENCOUNTER — Encounter: Payer: Self-pay | Admitting: Family Medicine

## 2021-06-02 ENCOUNTER — Telehealth: Payer: Self-pay | Admitting: Family Medicine

## 2021-06-02 NOTE — Telephone Encounter (Signed)
Pt reports light headedness and  faint / low blood pressure, needing to call EMS. She has recently been diagnosed with hypertension, has had episodes of palpitations, which took her to the Ed as well as most recently light headedness with" low blood pressure" has an appointment in 3 weeks, can you see if she can get a sooner appointment, do I need to change the request to urgent, actually I will, and re enter the referral if you let me know if I have to do this, thanks!

## 2021-06-03 ENCOUNTER — Other Ambulatory Visit: Payer: Self-pay | Admitting: Family Medicine

## 2021-06-03 ENCOUNTER — Encounter: Payer: Self-pay | Admitting: Internal Medicine

## 2021-06-03 ENCOUNTER — Ambulatory Visit (INDEPENDENT_AMBULATORY_CARE_PROVIDER_SITE_OTHER): Payer: Medicare Other

## 2021-06-03 ENCOUNTER — Ambulatory Visit (INDEPENDENT_AMBULATORY_CARE_PROVIDER_SITE_OTHER): Payer: Medicare Other | Admitting: Internal Medicine

## 2021-06-03 ENCOUNTER — Other Ambulatory Visit: Payer: Self-pay

## 2021-06-03 VITALS — BP 134/83 | HR 88 | Ht 67.0 in | Wt 152.0 lb

## 2021-06-03 DIAGNOSIS — R55 Syncope and collapse: Secondary | ICD-10-CM

## 2021-06-03 DIAGNOSIS — R002 Palpitations: Secondary | ICD-10-CM

## 2021-06-03 NOTE — Patient Instructions (Signed)
Medication Instructions:  Continue same medications   Lab Work: None ordered   Testing/Procedures: Echo  2 week Zio Monitor   Follow-Up: At [facility], you and your health needs are our priority.  As part of our continuing mission to provide you with exceptional heart care, we have created designated Provider Care Teams.  These Care Teams include your primary Cardiologist (physician) and Advanced Practice Providers (APPs -  Physician Assistants and Nurse Practitioners) who all work together to provide you with the care you need, when you need it.  We recommend signing up for the patient portal called "MyChart".  Sign up information is provided on this After Visit Summary.  MyChart is used to connect with patients for Virtual Visits (Telemedicine).  Patients are able to view lab/test results, encounter notes, upcoming appointments, etc.  Non-urgent messages can be sent to your provider as well.   To learn more about what you can do with MyChart, go to [email or web address].      Your next appointment:  3 months    The format for your next appointment: Office     Provider:  Dr.Branch

## 2021-06-03 NOTE — Progress Notes (Signed)
Cardiology Office Note:    Date:  06/03/2021   ID:  [name], DOB January 20, 1991, MRN [id number]  PCP:  Fayrene Helper, MD   [facility] Providers Cardiologist:  None     Referring MD: Fayrene Helper, MD   No chief complaint on file. Pre-Syncope  History of Present Illness:    Janet Acevedo is a 30 y.o. adult with a hx of HTN, GERD, SMA type II who presented to the ED with pre-syncope  She went to urgent care with these symptoms and it was suggested this may be due to an electrolyte disturbance. She was told to go to the ED. EKG showed sinus tachycardia HR 100 bpm, Qtc 495. A tropoinin was 2. She feels elevated heart rate. She was recently treated with prednisone. On Sunday afternoon she fainted. She was using the restroom when it happened. She started to feel strange and groggy and weak. Her pallor became white. She felt tingling and hotness and she went limp. Paramedics came 110/70 mmHg. Her vitals were normal. She's felt tired and weak. Her hands and feet feel cold and nausea. Her appetite is down. Feeling not like herself. She feels brain fog. No prior syncopal episode. Notes some feeling of fast heart rates. She started new BP meds olmasartan. She started crestor on the 12th of October. She has family hx of HLD and HTN. She started Evrysdi.for SMA on October 21st managed by neurology at [facility].  No notable cardiotoxicity.No family hx of arrhythmia. No family hx of heart disease. Great grandmother had congestive heart failure and her great aunt. No chest pain, dyspnea, no LE edema. No orthopnea or PND.    EKG 05/12/2021 sinus tachycardia, Qtc 495 ms EKG 01/13/2017 sinus rhythm, Qtc 492 ms  02/26/2021 LDL 142  TG 93 TC 197 Crt 0.1 K 3.9 TSH 1.3 A1c 5.0%   Past Medical History:  Diagnosis Date   Allergy    Anxiety    Asthma    Depression, major, single episode, moderate (HCC) 09/05/2017   GERD (gastroesophageal reflux disease)    Hypertension 05/07/2021    Neuromuscular disorder ([place])    Osteomyelitis ([place]) June 2011   Otitis externa, eczematoid 05/28/2019   Perennial allergic rhinitis    Seborrheic dermatitis of scalp 2006   Spinal muscle atrophy ([place])    type 2/3 18 months    Past Surgical History:  Procedure Laterality Date   bilateral hip reinsertion  under age 22   hips out of socket, pt was only able to cruise hold on and move short distances    BONE BIOPSY     spinal bone    MIRENA PLACED 10/27/10     SPINAL FUSION  1999   rods placed in thoracolumbar spine to excessive scoliosis primarily    [place] 07/29/2020    Current Medications: No outpatient medications have been marked as taking for the 06/03/21 encounter (Appointment) with Janina Mayo, MD.     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[place] Strain: Low Risk    Difficulty of Paying Living Expenses: Not hard at all  Food Insecurity: No Food Insecurity   Worried About Charity fundraiser in the Last Year: Never true   Arboriculturist in the Last Year: Never true  Transportation Needs: No Transportation Needs   Lack of Transportation (Medical): No   Lack of Transportation (Non-Medical): No  Physical Activity: Inactive   Days of Exercise per Week: 0 days   Minutes of Exercise per Session: 0 min  Stress: No  Stress Concern Present   Feeling of Stress : Only a little  Social Connections: Socially Isolated   Frequency of Communication with Friends and Family: More than three times a week   Frequency of Social Gatherings with Friends and Family: Twice a week   Attends Religious Services: Never   Marine scientist or Organizations: No   Attends Music therapist: Never   Marital Status: Never married     Family History: The patient's family history includes Anxiety disorder in [place] brother; Depression in [place] brother; Diabetes in [place] brother; Drug abuse in [place] brother; Hyperlipidemia in [place]. Joos's mother; Hypertension in [place] brother and mother.  ROS:   Please see the history of present illness.     All other systems reviewed and are negative.  EKGs/Labs/Other Studies Reviewed:    The following studies were reviewed today:   EKG:   Per above  Recent Labs: 02/26/2021: ALT 15; TSH 1.340 05/09/2021: BUN 9; Creatinine, Ser <0.30; Hemoglobin 13.2; Platelets 266; Potassium 3.9; Sodium 136  Recent Lipid Panel    Component Value Date/Time   CHOL 197 02/26/2021 1526   TRIG 93 02/26/2021 1526   HDL 38 (L) 02/26/2021 1526   CHOLHDL 5.2 (H) 02/26/2021 1526   CHOLHDL 4.6 10/06/2018 1353   VLDL 12 02/08/2017 1450   LDLCALC 142 (H) 02/26/2021 1526   LDLCALC 140 (H) 10/06/2018 1353     Risk Assessment/Calculations:       Physical Exam:    VS:  There were no vitals taken for this visit.    Wt Readings from Last 3 Encounters:  02/23/20 152 lb (68.9 kg)  01/22/20 152 lb (68.9 kg)  01/03/20 152 lb (68.9 kg)     GEN:  Well nourished, well developed in no acute distress. In a motorized wheel chair HEENT: Normal NECK: No JVD; No carotid bruits LYMPHATICS: No lymphadenopathy CARDIAC: RRR, no murmurs, rubs, gallops RESPIRATORY:  Normal wob, Clear to auscultation  ABDOMEN: Soft, non-tender,  non-distended MUSCULOSKELETAL:  No edema; No deformity  SKIN: Warm and dry NEUROLOGIC:  Alert and oriented x 3 PSYCHIATRIC:  Normal affect   ASSESSMENT:    #Pre-Syncope: Her symptoms are c/w vasovagal syncope. However considering hx of SMA and prolonged Qtc will obtain a cardiac event monitor and TTE. Arrhythmia and cardiomyopathy risk is increased in patients with SMA. I told her to avoid Qtc prolonging medications and provided a hand out for oral medications that prolong the QT.   PLAN:    In order of problems listed above:  Cardiac event monitor 2 weeks  TTE Avoid Qtc prolonging oral medications Follow up 3 months         Medication Adjustments/Labs and Tests Ordered: Current medicines are reviewed at length with the patient today.  Concerns regarding medicines are outlined above.   Signed, Janina Mayo, MD  06/03/2021 2:58 PM    [place]

## 2021-06-03 NOTE — Progress Notes (Unsigned)
Patient enrolled for [employer] to mail a 14 day ZIO XT monitor to her address on file.

## 2021-06-05 ENCOUNTER — Telehealth: Payer: Self-pay

## 2021-06-05 ENCOUNTER — Telehealth: Payer: Self-pay | Admitting: Internal Medicine

## 2021-06-05 NOTE — Telephone Encounter (Signed)
Patient states she is returning a call to Jocelyn Lamer regarding her upcoming echo, 12/28. She states it is regarding her being in a wheelchair and needing transfer assistance. I was unable to contact Jocelyn Lamer for transfer.

## 2021-06-05 NOTE — Telephone Encounter (Signed)
Called patient, she is aware she will have to have the ECHO over at [facility] due to her needing assistance for the testing.  Patient states that she needs to know what type of specific lift they use because she can only use a certain type and she does not want to go and not be able to do the testing. I advised I could get her the number to contact over at the hospital and they would be able to answer the specific questions she has.  At some point can you call and just give her this number so she can call and ask them what questions she has.  Thanks!

## 2021-06-05 NOTE — Telephone Encounter (Signed)
Left 2 phones message 06-04-21 making her aware of her upcomong appmt at [facility] and sent a reminder.  Called patient 06-05-21 and gave her 2 contacts (Rich and Melissa) to speak w about her questions concerning her test and the liftat [facility].  06-05-21 VB

## 2021-06-06 ENCOUNTER — Encounter: Payer: Self-pay | Admitting: Family Medicine

## 2021-06-06 ENCOUNTER — Other Ambulatory Visit: Payer: Self-pay | Admitting: Family Medicine

## 2021-06-06 MED ORDER — NYSTATIN 100000 UNIT/GM EX POWD: 1.0000 "application " | Freq: Three times a day (TID) | CUTANEOUS | 1 refills | Status: DC

## 2021-06-07 DIAGNOSIS — R002 Palpitations: Secondary | ICD-10-CM | POA: Diagnosis not present

## 2021-06-07 DIAGNOSIS — R55 Syncope and collapse: Secondary | ICD-10-CM | POA: Diagnosis not present

## 2021-06-10 ENCOUNTER — Ambulatory Visit: Payer: Medicaid Other | Admitting: Psychiatry

## 2021-06-18 ENCOUNTER — Encounter: Payer: Self-pay | Admitting: Family Medicine

## 2021-06-18 ENCOUNTER — Ambulatory Visit (INDEPENDENT_AMBULATORY_CARE_PROVIDER_SITE_OTHER): Payer: Medicare Other | Admitting: Family Medicine

## 2021-06-18 ENCOUNTER — Other Ambulatory Visit: Payer: Self-pay

## 2021-06-18 VITALS — BP 108/80

## 2021-06-18 DIAGNOSIS — J302 Other seasonal allergic rhinitis: Secondary | ICD-10-CM

## 2021-06-18 DIAGNOSIS — E785 Hyperlipidemia, unspecified: Secondary | ICD-10-CM

## 2021-06-18 DIAGNOSIS — F321 Major depressive disorder, single episode, moderate: Secondary | ICD-10-CM

## 2021-06-18 DIAGNOSIS — I10 Essential (primary) hypertension: Secondary | ICD-10-CM | POA: Diagnosis not present

## 2021-06-18 DIAGNOSIS — F411 Generalized anxiety disorder: Secondary | ICD-10-CM

## 2021-06-18 DIAGNOSIS — G121 Other inherited spinal muscular atrophy: Secondary | ICD-10-CM

## 2021-06-18 DIAGNOSIS — J4540 Moderate persistent asthma, uncomplicated: Secondary | ICD-10-CM

## 2021-06-18 NOTE — Patient Instructions (Addendum)
F/u  in late feb/ early March, call if you need  me sooner  Good that health is improving and that you are getting the medical treatments you need  Fasting lipd, cmp and eGFR and cBC 5 days before next visit  Take blood pressure once daily same time every day  Thanks for choosing [facility], we consider it a privelige to serve you.

## 2021-06-21 ENCOUNTER — Encounter: Payer: Self-pay | Admitting: Internal Medicine

## 2021-06-22 ENCOUNTER — Encounter: Payer: Self-pay | Admitting: Family Medicine

## 2021-06-22 NOTE — Progress Notes (Signed)
Virtual Visit via Telephone Note  I connected with Janet Acevedo on 06/22/21 at  3:00 PM EST by telephone and verified that I am speaking with the correct person using two identifiers.  Location: Patient: home Provider: office   I discussed the limitations, risks, security and privacy concerns of performing an evaluation and management service by telephone and the availability of in person appointments. I also discussed with the patient that there may be a patient responsible charge related to this service. The patient expressed understanding and agreed to proceed.   History of Present Illness: F/u hypertension and chronic problems States her blood pressure fluctuates a lot when sh e checks it, during different times of the day, multiple times and is concerned as to whether this is a problem Doing well with therapist,mental health is improving, and she is happy that she is being followed by Cardiology and Pulmonary No more fainting spells and taking new medication for her SMA   Observations/Objective: BP 108/80  Good communication with no confusion and intact memory. Alert and oriented x 3 No signs of respiratory distress during speech   Assessment and Plan: Depression, major, single episode, moderate (HCC) Controlled on current meds, continue same  GAD (generalized anxiety disorder) Improved on medication and with therapy, continue both  Dyslipidemia Hyperlipidemia:Low fat diet discussed and encouraged.   Lipid Panel  Lab Results  Component Value Date   CHOL 197 02/26/2021   HDL 38 (L) 02/26/2021   LDLCALC 142 (H) 02/26/2021   TRIG 93 02/26/2021   CHOLHDL 5.2 (H) 02/26/2021     Updated lab needed at/ before next visit. Needs to lower fat and continue statin  Hypertension Controlled, no change in medication Advised to test BP once daily unless she has symptoms that concern her that it may be abnormal  Type II spinal muscular atrophy (HCC) Recently started on  medication for this and tolerating well  Moderate persistent asthma Controlled, no change in medication   Seasonal allergies Controlled, no change in medication '   Follow Up Instructions:    I discussed the assessment and treatment plan with the patient. The patient was provided an opportunity to ask questions and all were answered. The patient agreed with the plan and demonstrated an understanding of the instructions.   The patient was advised to call back or seek an in-person evaluation if the symptoms worsen or if the condition fails to improve as anticipated.  I provided 18  minutes of non-face-to-face time during this encounter.   Tula Nakayama, MD

## 2021-06-22 NOTE — Assessment & Plan Note (Signed)
Controlled, no change in medication Advised to test BP once daily unless she has symptoms that concern her that it may be abnormal

## 2021-06-22 NOTE — Assessment & Plan Note (Signed)
Controlled, no change in medication  

## 2021-06-22 NOTE — Assessment & Plan Note (Signed)
Hyperlipidemia:Low fat diet discussed and encouraged.   Lipid Panel  Lab Results  Component Value Date   CHOL 197 02/26/2021   HDL 38 (L) 02/26/2021   LDLCALC 142 (H) 02/26/2021   TRIG 93 02/26/2021   CHOLHDL 5.2 (H) 02/26/2021     Updated lab needed at/ before next visit. Needs to lower fat and continue statin

## 2021-06-22 NOTE — Assessment & Plan Note (Signed)
Controlled on current meds, continue same

## 2021-06-22 NOTE — Assessment & Plan Note (Signed)
Improved on medication and with therapy, continue both

## 2021-06-22 NOTE — Assessment & Plan Note (Signed)
Recently started on medication for this and tolerating well

## 2021-06-23 ENCOUNTER — Ambulatory Visit: Payer: Medicare Other | Admitting: Cardiology

## 2021-06-23 ENCOUNTER — Other Ambulatory Visit: Payer: Self-pay | Admitting: Family Medicine

## 2021-06-26 ENCOUNTER — Other Ambulatory Visit: Payer: Self-pay | Admitting: Family Medicine

## 2021-06-26 ENCOUNTER — Ambulatory Visit: Payer: Medicaid Other | Admitting: Psychiatry

## 2021-06-26 DIAGNOSIS — J4541 Moderate persistent asthma with (acute) exacerbation: Secondary | ICD-10-CM | POA: Diagnosis not present

## 2021-06-26 DIAGNOSIS — J4540 Moderate persistent asthma, uncomplicated: Secondary | ICD-10-CM | POA: Diagnosis not present

## 2021-06-27 DIAGNOSIS — R002 Palpitations: Secondary | ICD-10-CM | POA: Diagnosis not present

## 2021-06-27 DIAGNOSIS — R55 Syncope and collapse: Secondary | ICD-10-CM | POA: Diagnosis not present

## 2021-07-01 ENCOUNTER — Encounter: Payer: Self-pay | Admitting: Internal Medicine

## 2021-07-01 ENCOUNTER — Telehealth: Payer: Self-pay | Admitting: Internal Medicine

## 2021-07-01 NOTE — Telephone Encounter (Signed)
Called Ms. Perritt and described her ziopatch results in detail.

## 2021-07-01 NOTE — Progress Notes (Deleted)
Psychiatric Initial Adult Assessment   Patient Identification: Janet Acevedo MRN:  [id number] Date of Evaluation:  07/01/2021 Referral Source: *** Chief Complaint:   Visit Diagnosis: No diagnosis found.  History of Present Illness:   Janet Acevedo is a 30 y.o. year old adult with a history of  anxiety,  Type II spinal muscular atrophy (wheel chair dependent),  micrognasia, who is referred for depression.       Associated Signs/Symptoms: Depression Symptoms:  {DEPRESSION SYMPTOMS:20000} (Hypo) Manic Symptoms:  {BHH MANIC SYMPTOMS:22872} Anxiety Symptoms:  {BHH ANXIETY SYMPTOMS:22873} Psychotic Symptoms:  {BHH PSYCHOTIC SYMPTOMS:22874} PTSD Symptoms: {BHH PTSD SYMPTOMS:22875}  Past Psychiatric History:  Outpatient:  Psychiatry admission:  Previous suicide attempt:  Past trials of medication:  History of violence:    Previous Psychotropic Medications: {YES/NO:21197}  Substance Abuse History in the last 12 months:  {yes no:314532}  Consequences of Substance Abuse: {BHH CONSEQUENCES OF SUBSTANCE ABUSE:22880}  Past Medical History:  Past Medical History:  Diagnosis Date   Allergy    Anxiety    Asthma    Depression, major, single episode, moderate ([place]) 09/05/2017   GERD (gastroesophageal reflux disease)    Hypertension 05/07/2021   Neuromuscular disorder ([place])    Osteomyelitis ([place]) June 2011   Otitis externa, eczematoid 05/28/2019   Perennial allergic rhinitis    Seborrheic dermatitis of scalp 2006   Spinal muscle atrophy (HCC)    type 2/3 18 months    Past Surgical History:  Procedure Laterality Date   bilateral hip reinsertion  under age 88   hips out of socket, pt was only able to cruise hold on and move short distances    BONE BIOPSY     spinal bone    MIRENA PLACED 10/27/10     SPINAL FUSION  1999   rods placed in thoracolumbar spine to excessive scoliosis primarily    [place] 07/29/2020    Family Psychiatric History: ***  Family  History:  Family History  Problem Relation Age of Onset   Hyperlipidemia Mother    Hypertension Mother    Hypertension Brother    Diabetes Brother        borderline    Anxiety disorder Brother    Depression Brother    Drug abuse Brother     Social History:   Social History   Socioeconomic History   Marital status: Single    Spouse name: Not on file   Number of children: Not on file   Years of education: Not on file   Highest education level: Associate degree: academic program  Occupational History   Occupation: student  Tobacco Use   Smoking status: Never   Smokeless tobacco: Never  Vaping Use   Vaping Use: Never used  Substance and Sexual Activity   Alcohol use: No   Drug use: No   Sexual activity: Never  Other Topics Concern   Not on file  Social History Narrative   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[place] Determinants of Radio broadcast assistant Strain: Low Risk    Difficulty of Paying Living Expenses: Not hard at all  Food Insecurity: No Food Insecurity   Worried About Charity fundraiser in the Last Year: Never true   Arboriculturist in the Last Year: Never true  Transportation Needs: No Transportation Needs   Lack of Transportation (Medical): No   Lack of Transportation (Non-Medical): No  Physical  Activity: Inactive   Days of Exercise per Week: 0 days   Minutes of Exercise per Session: 0 min  Stress: No Stress Concern Present   Feeling of Stress : Only a little  Social Connections: Socially Isolated   Frequency of Communication with Friends and Family: More than three times a week   Frequency of Social Gatherings with Friends and Family: Twice a week   Attends Religious Services: Never   Marine scientist or Organizations: No   Attends Archivist Meetings: Never   Marital Status: Never married    Additional Social History: ***  Allergies:   Allergies  Allergen Reactions   Other Diarrhea    Ciprofloxacin Nausea And Vomiting    Metabolic Disorder Labs: Lab Results  Component Value Date   HGBA1C 5.0 02/26/2021   MPG 88 01/06/2018   MPG 100 02/08/2017   No results found for: PROLACTIN Lab Results  Component Value Date   CHOL 197 02/26/2021   TRIG 93 02/26/2021   HDL 38 (L) 02/26/2021   CHOLHDL 5.2 (H) 02/26/2021   VLDL 12 02/08/2017   LDLCALC 142 (H) 02/26/2021   LDLCALC 140 (H) 10/06/2018   Lab Results  Component Value Date   TSH 1.340 02/26/2021    Therapeutic Level Labs: No results found for: LITHIUM No results found for: CBMZ No results found for: VALPROATE  Current Medications: Current Outpatient Medications  Medication Sig Dispense Refill   albuterol (ACCUNEB) 1.25 MG/3ML nebulizer solution Take 3 mLs (1.25 mg total) by nebulization every 4 (four) hours as needed. Dx J45.40 75 mL 1   albuterol (PROVENTIL) (2.5 MG/3ML) 0.083% nebulizer solution Take 3 mLs (2.5 mg total) by nebulization every 6 (six) hours as needed for wheezing or shortness of breath. 150 mL 1   albuterol (VENTOLIN HFA) 108 (90 Base) MCG/ACT inhaler Inhale 2 puffs into the lungs every 6 (six) hours as needed for wheezing or shortness of breath. 8 g 3   albuterol (VENTOLIN HFA) 108 (90 Base) MCG/ACT inhaler Inhale 1-2 puffs into the lungs every 6 (six) hours as needed for wheezing or shortness of breath. 18 g 0   betamethasone dipropionate (DIPROLENE) 0.05 % cream APPLY TOPICALLY 2 (TWO) TIMES DAILY. 45 g 3   busPIRone (BUSPAR) 7.5 MG tablet TAKE ONE TABLET BY MOUTH AT 7 AM , AND AT 10 PM, YOU MAY TAKE HALF TABLET FOR ANXIETY AT 2 PM 135 tablet 2   busPIRone (BUSPAR) 7.5 MG tablet Take 1 tablet (7.5 mg total) by mouth 3 (three) times daily. 90 tablet 3   cromolyn (OPTICROM) 4 % ophthalmic solution PLACE 1 DROP INTO BOTH EYES FOUR TIMES DAILY 40 mL 8   ergocalciferol (VITAMIN D2) 1.25 MG (50000 UT) capsule Take 1 capsule (50,000 Units total) by mouth once a week. One capsule once weekly 12  capsule 2   Fluocinolone Acetonide Scalp 0.01 % OIL APPLY SPARINGLY TO SCALP TWO TIMES WEEKLY, AS NEEDED, FOR ITCH AND FLAKING 118.28 mL 3   ketoconazole (NIZORAL) 2 % cream APPLY 1 APPLICATION TOPICALLY AS NEEDED FOR DERMATITIS 15 g 3   ketoconazole (NIZORAL) 2 % shampoo APPLY TO SCALP FOR 5 MINUTES AND RINSE OUT 120 mL 4   levocetirizine (XYZAL) 5 MG tablet TAKE 1 TABLET BY MOUTH EVERY DAY IN THE EVENING 90 tablet 3   metoprolol succinate (TOPROL-XL) 25 MG 24 hr tablet TAKE 1 TABLET (25 MG TOTAL) BY MOUTH DAILY. 30 tablet 1   montelukast (SINGULAIR) 10 MG tablet TAKE  1 TABLET BY MOUTH EVERY DAY 90 tablet 3   nystatin (MYCOSTATIN/NYSTOP) powder Apply 1 application topically 3 (three) times daily. 60 g 1   olmesartan (BENICAR) 20 MG tablet Take 1 tablet (20 mg total) by mouth daily. 30 tablet 3   omeprazole (PRILOSEC) 20 MG capsule THE PATIENT HAS TROUBLE SWALLOWING. PLEASE TAKE 2 CAPSULES TWICE A DAY BY MOUTH 360 capsule 3   ondansetron (ZOFRAN) 4 MG tablet Take one tablet by mouth once daily , as needed, for nausea 30 tablet 1   Respiratory Therapy Supplies (NEBULIZER/ADULT MASK) KIT Insurance preference 1 each 0   Respiratory Therapy Supplies (NEBULIZER/TUBING/MOUTHPIECE) KIT Use as directed. Insurance preference. Nebulizer kit and mask 1 kit 0   Risdiplam (EVRYSDI) 0.75 MG/ML SOLR      rosuvastatin (CRESTOR) 5 MG tablet Take 1 tablet (5 mg total) by mouth daily. 90 tablet 3   sertraline (ZOLOFT) 50 MG tablet TAKE 1 TABLET BY MOUTH EVERY DAY 90 tablet 2   Spacer/Aero-Holding Chambers (AEROCHAMBER PLUS) inhaler Use as instructed 1 each 2   SYMBICORT 160-4.5 MCG/ACT inhaler Inhale 2 puffs into the lungs in the morning and at bedtime. 1 each 12   UNABLE TO FIND Gloves- 1 box per month as needed 200 each prn   UNABLE TO FIND Under pads- for use daily as needed 100 each prn   UNABLE TO FIND Incontinence liners- use as needed for incontinence Gloves- 1 box as needed for incontinence DX urinary  incontinence 1 each 0   UNABLE TO FIND Nebulizer machine and tubing  DX J45.40 1 each 0   No current facility-administered medications for this visit.    Musculoskeletal: Strength & Muscle Tone:    Gait & Station: {PE GAIT ED ZYYQ:82500} Patient leans: {Patient Leans:21022755}  Psychiatric Specialty Exam: Review of Systems  There were no vitals taken for this visit.There is no height or weight on file to calculate BMI.  General Appearance: Fairly Groomed  Eye Contact:  Good  Speech:  Clear and Coherent  Volume:  Normal  Mood:  {BHH MOOD:22306}  Affect:  {Affect (PAA):22687}  Thought Process:  Coherent  Orientation:  Full (Time, Place, and Person)  Thought Content:  Logical  Suicidal Thoughts:  {ST/HT (PAA):22692}  Homicidal Thoughts:  {ST/HT (PAA):22692}  Memory:  Immediate;   Good  Judgement:  {Judgement (PAA):22694}  Insight:  {Insight (PAA):22695}  Psychomotor Activity:  Normal  Concentration:  Concentration: Good and Attention Span: Good  Recall:  Good  Fund of Knowledge:Good  Language: Good  Akathisia:  No  Handed:  Right  AIMS (if indicated):  not done  Assets:  Communication Skills Desire for Improvement  ADL's:  Intact  Cognition: WNL  Sleep:  {BHH GOOD/FAIR/POOR:22877}   Screenings: GAD-7    Flowsheet Row Office Visit from 05/14/2021 in [place] Visit from 04/02/2021 in [facility] Video Visit from 02/25/2021 in [place] from 09/10/2020 in [place] Video Visit from 06/12/2020 in [facility]  Total GAD-7 Score _0 PHQ2-9    [place] Office Visit from 06/18/2021 in [facility] Office Visit from 05/14/2021 in [facility] Office Visit from 05/07/2021 in [place] [facility] Visit from 04/02/2021 in [facility] Video Visit from 02/25/2021 in [facility]  PHQ-2 Total  Score 0 0 0 6 1  PHQ-9 Total Score -- 0 3 16 --  [place] ED from 05/09/2021 in [place] Counselor from 12/31/2020 in [place] Counselor from 09/10/2020 in [place] No Risk No Risk No Risk       Assessment and Plan:    Plan   The patient demonstrates the following risk factors for suicide: Chronic risk factors for suicide include: {Chronic Risk Factors for MKLKJZP:91505697}. Acute risk factors for suicide include: {Acute Risk Factors for XYIAXKP:53748270}. Protective factors for this patient include: {Protective Factors for Suicide BEML:54492010}. Considering these factors, the overall suicide risk at this point appears to be {Desc; low/moderate/high:110033}. Patient {ACTION; IS/IS OFH:21975883} appropriate for outpatient follow up.     Norman Clay, MD 12/6/20225:19 PM

## 2021-07-07 ENCOUNTER — Ambulatory Visit: Payer: Medicaid Other | Admitting: Psychiatry

## 2021-07-23 ENCOUNTER — Ambulatory Visit (HOSPITAL_COMMUNITY)
Admission: RE | Admit: 2021-07-23 | Discharge: 2021-07-23 | Disposition: A | Discharge status: Home / Self Care | Payer: Medicare Other | Source: Ambulatory Visit | Attending: Internal Medicine | Admitting: Internal Medicine

## 2021-07-23 ENCOUNTER — Other Ambulatory Visit: Payer: Self-pay

## 2021-07-23 DIAGNOSIS — R55 Syncope and collapse: Secondary | ICD-10-CM | POA: Insufficient documentation

## 2021-07-23 DIAGNOSIS — I10 Essential (primary) hypertension: Secondary | ICD-10-CM | POA: Insufficient documentation

## 2021-07-23 DIAGNOSIS — R002 Palpitations: Secondary | ICD-10-CM | POA: Insufficient documentation

## 2021-07-23 LAB — ECHOCARDIOGRAM COMPLETE
Area-P 1/2: 3.65 cm2
S' Lateral: 2.5 cm

## 2021-07-23 NOTE — Progress Notes (Signed)
°  Echocardiogram 2D Echocardiogram has been performed.  Janet Acevedo 07/23/2021, 2:55 PM

## 2021-07-27 ENCOUNTER — Other Ambulatory Visit: Payer: Self-pay | Admitting: Family Medicine

## 2021-07-27 DIAGNOSIS — J4541 Moderate persistent asthma with (acute) exacerbation: Secondary | ICD-10-CM | POA: Diagnosis not present

## 2021-07-27 DIAGNOSIS — J4540 Moderate persistent asthma, uncomplicated: Secondary | ICD-10-CM | POA: Diagnosis not present

## 2021-07-30 ENCOUNTER — Other Ambulatory Visit: Payer: Self-pay | Admitting: Family Medicine

## 2021-08-10 ENCOUNTER — Other Ambulatory Visit: Payer: Self-pay | Admitting: Family Medicine

## 2021-08-19 DIAGNOSIS — J984 Other disorders of lung: Secondary | ICD-10-CM | POA: Diagnosis not present

## 2021-08-23 ENCOUNTER — Other Ambulatory Visit: Payer: Self-pay | Admitting: Family Medicine

## 2021-08-24 DIAGNOSIS — J984 Other disorders of lung: Secondary | ICD-10-CM | POA: Diagnosis not present

## 2021-08-24 DIAGNOSIS — R0689 Other abnormalities of breathing: Secondary | ICD-10-CM | POA: Diagnosis not present

## 2021-08-27 DIAGNOSIS — J4541 Moderate persistent asthma with (acute) exacerbation: Secondary | ICD-10-CM | POA: Diagnosis not present

## 2021-08-27 DIAGNOSIS — J4540 Moderate persistent asthma, uncomplicated: Secondary | ICD-10-CM | POA: Diagnosis not present

## 2021-09-08 ENCOUNTER — Ambulatory Visit: Payer: Medicare Other | Admitting: Internal Medicine

## 2021-09-09 ENCOUNTER — Other Ambulatory Visit: Payer: Self-pay | Admitting: Family Medicine

## 2021-09-18 ENCOUNTER — Other Ambulatory Visit: Payer: Self-pay

## 2021-09-23 ENCOUNTER — Other Ambulatory Visit: Payer: Self-pay

## 2021-09-23 ENCOUNTER — Ambulatory Visit (INDEPENDENT_AMBULATORY_CARE_PROVIDER_SITE_OTHER): Payer: Medicare Other | Admitting: Family Medicine

## 2021-09-23 ENCOUNTER — Encounter: Payer: Self-pay | Admitting: Family Medicine

## 2021-09-23 VITALS — BP 125/84 | HR 86

## 2021-09-23 DIAGNOSIS — J4540 Moderate persistent asthma, uncomplicated: Secondary | ICD-10-CM | POA: Diagnosis not present

## 2021-09-23 DIAGNOSIS — G121 Other inherited spinal muscular atrophy: Secondary | ICD-10-CM

## 2021-09-23 DIAGNOSIS — E785 Hyperlipidemia, unspecified: Secondary | ICD-10-CM | POA: Diagnosis not present

## 2021-09-23 DIAGNOSIS — H6122 Impacted cerumen, left ear: Secondary | ICD-10-CM | POA: Diagnosis not present

## 2021-09-23 DIAGNOSIS — F321 Major depressive disorder, single episode, moderate: Secondary | ICD-10-CM

## 2021-09-23 DIAGNOSIS — F411 Generalized anxiety disorder: Secondary | ICD-10-CM

## 2021-09-23 DIAGNOSIS — H6592 Unspecified nonsuppurative otitis media, left ear: Secondary | ICD-10-CM

## 2021-09-23 MED ORDER — HYDROCORTISONE 2.5 % EX CREA: TOPICAL_CREAM | Freq: Two times a day (BID) | CUTANEOUS | 1 refills | Status: DC

## 2021-09-23 MED ORDER — SULFAMETHOXAZOLE-TRIMETHOPRIM 800-160 MG PO TABS: 1.0000 | ORAL_TABLET | Freq: Two times a day (BID) | ORAL | 0 refills | Status: DC

## 2021-09-23 MED ORDER — FLUTICASONE PROPIONATE 50 MCG/ACT NA SUSP: 2.0000 | Freq: Every day | NASAL | 6 refills | Status: DC

## 2021-09-23 NOTE — Patient Instructions (Addendum)
F/U in 4 months, call if you need me sooner   Left ear is impacted with wax, we will flush today,f;lush has been successfiul, you do need 1 week of antibiiotic as ear drum is dull with poor reflex, septra is prescribed\ Continue xyzal and singulair daily for allergy symptoms and I have added flonase daily also  Call if left ear symptoms persist I will refer you to ENT  Lipid , cmp and EGFR past due please get this today  I will work on psychiatry and therapy for you, I encourage you to look for therapist you are wanting to see also  Thanks for choosing [facility], we consider it a privelige to serve you.

## 2021-09-23 NOTE — Progress Notes (Signed)
° °  Janet Acevedo     MRN: [id number]      DOB: 01-23-1991   HPI   Ne[place]irth is here for follow up and re-evaluation of chronic medical conditions, medication management and review of any available recent lab and radiology data.  Preventive health is updated, specifically  Cancer screening and Immunization.   Questions or concerns regarding consultations or procedures which the PT has had in the interim are  addressed. The PT denies any adverse reactions to current medications since the last visit.  1 week h/o left ear pain and pressure , with  muffled hearing , frontal pressure, no nasal drainage, minor sore throat,no cough ROS Denies recent fever or chills. Denies sinus pressure, nasal congestion, or sore throat. Denies chest congestion, productive cough or wheezing. Denies chest pains, palpitations and leg swelling Denies abdominal pain, nausea, vomiting,diarrhea or constipation.   Chronic  limitation in mobility. Denies headaches, seizures, numbness, or tingling. Denies uncontrolled depression, anxiety or insomnia. Denies skin break down or rash.   PE  BP 125/84    Pulse 86    SpO2 95%   Patient alert and oriented and in no cardiopulmonary distress.  HEENT: No facial asymmetry, EOMI,     Neck supple .left ear impacted with cerumen and TM ull , once visible with poor light reflex  Chest: Clear to auscultation bilaterally.  CVS: S1, S2 no murmurs, no S3.Regular rate.  ABD: Soft non tender.   Ext: No edema  MS: decreased  ROM spine, shoulders, hips and knees.  Skin: Intact, no ulcerations or rash noted.  Psych: Good eye contact, normal affect. Memory intact not anxious or depressed appearing.  CNS: CN 2-12 intact, reduced power and tone in all extremities  Assessment & Plan  Left otitis media with effusion Septra prescribed  Left ear impacted cerumen Successful external flush by nursing  GAD (generalized anxiety disorder) In therapy and on medication, fair control,  needs to improve  Depression, major, single episode, moderate (HCC) Controlled, no change in medication   Dyslipidemia Hyperlipidemia:Low fat diet discussed and encouraged.   Lipid Panel  Lab Results  Component Value Date   CHOL 134 09/23/2021   HDL 37 (L) 09/23/2021   LDLCALC 80 09/23/2021   TRIG 90 09/23/2021   CHOLHDL 3.6 09/23/2021     No med change, controlled   Moderate persistent asthma Controlled, no change in medication

## 2021-09-24 DIAGNOSIS — J4540 Moderate persistent asthma, uncomplicated: Secondary | ICD-10-CM | POA: Diagnosis not present

## 2021-09-24 DIAGNOSIS — J4541 Moderate persistent asthma with (acute) exacerbation: Secondary | ICD-10-CM | POA: Diagnosis not present

## 2021-09-24 LAB — LIPID PANEL
Chol/HDL Ratio: 3.6 ratio (ref 0.0–4.4)
Cholesterol, Total: 134 mg/dL (ref 100–199)
HDL: 37 mg/dL — ABNORMAL LOW (ref >39)
LDL Chol Calc (NIH): 80 mg/dL (ref 0–99)
Triglycerides: 90 mg/dL (ref 0–149)
VLDL Cholesterol Cal: 17 mg/dL (ref 5–40)

## 2021-09-24 LAB — CMP14+EGFR
ALT: 16 IU/L (ref 0–32)
AST: 17 IU/L (ref 0–40)
Albumin/Globulin Ratio: 1.3 (ref 1.2–2.2)
Albumin: 4.5 g/dL (ref 3.9–5.0)
Alkaline Phosphatase: 118 IU/L (ref 44–121)
BUN/Creatinine Ratio: 33 — ABNORMAL HIGH (ref 9–23)
BUN: 7 mg/dL (ref 6–20)
Bilirubin Total: 0.4 mg/dL (ref 0.0–1.2)
CO2: 26 mmol/L (ref 20–29)
Calcium: 9.7 mg/dL (ref 8.7–10.2)
Chloride: 98 mmol/L (ref 96–106)
Creatinine, Ser: 0.21 mg/dL — ABNORMAL LOW (ref 0.57–1.00)
Globulin, Total: 3.6 g/dL (ref 1.5–4.5)
Glucose: 99 mg/dL (ref 70–99)
Potassium: 5.1 mmol/L (ref 3.5–5.2)
Sodium: 139 mmol/L (ref 134–144)
Total Protein: 8.1 g/dL (ref 6.0–8.5)
eGFR: 160 mL/min/{1.73_m2} (ref >59)

## 2021-09-25 ENCOUNTER — Ambulatory Visit: Payer: Medicare Other | Admitting: Internal Medicine

## 2021-09-30 ENCOUNTER — Ambulatory Visit (INDEPENDENT_AMBULATORY_CARE_PROVIDER_SITE_OTHER): Payer: Medicare Other | Admitting: Licensed Clinical Social Worker

## 2021-09-30 ENCOUNTER — Other Ambulatory Visit: Payer: Self-pay

## 2021-09-30 ENCOUNTER — Encounter: Payer: Self-pay | Admitting: Family Medicine

## 2021-09-30 DIAGNOSIS — H6592 Unspecified nonsuppurative otitis media, left ear: Secondary | ICD-10-CM | POA: Insufficient documentation

## 2021-09-30 DIAGNOSIS — Z91199 Patient's noncompliance with other medical treatment and regimen due to unspecified reason: Secondary | ICD-10-CM

## 2021-09-30 DIAGNOSIS — H6122 Impacted cerumen, left ear: Secondary | ICD-10-CM | POA: Insufficient documentation

## 2021-09-30 NOTE — Assessment & Plan Note (Signed)
Septra prescribed 

## 2021-09-30 NOTE — Assessment & Plan Note (Signed)
Hyperlipidemia:Low fat diet discussed and encouraged. ? ? ?Lipid Panel  ?Lab Results  ?Component Value Date  ? CHOL 134 09/23/2021  ? HDL 37 (L) 09/23/2021  ? [place] 80 09/23/2021  ? TRIG 90 09/23/2021  ? CHOLHDL 3.6 09/23/2021  ? ? ? ?No med change, controlled  ?

## 2021-09-30 NOTE — Assessment & Plan Note (Signed)
In therapy and on medication, fair control, needs to improve ?

## 2021-09-30 NOTE — Assessment & Plan Note (Signed)
Controlled, no change in medication  

## 2021-09-30 NOTE — Assessment & Plan Note (Signed)
Successful external flush by nursing ?

## 2021-10-01 NOTE — BH Specialist Note (Signed)
Left message encouraging contact 

## 2021-10-13 ENCOUNTER — Encounter: Payer: Self-pay | Admitting: Internal Medicine

## 2021-10-13 ENCOUNTER — Ambulatory Visit: Payer: Medicare Other | Admitting: Internal Medicine

## 2021-10-13 NOTE — Progress Notes (Deleted)
?Cardiology Office Note:   ? ?Date:  10/13/2021  ? ?ID:  Janet Acevedo, DOB 08-22-1990, MRN [id number] ? ?PCP:  Fayrene Helper, MD ?  ?[facility] Providers ?Cardiologist:  None    ? ?Referring MD: Fayrene Helper, MD  ? ?No chief complaint on file. ?Pre-Syncope ? ?History of Present Illness:   ? ?Janet Acevedo is a 31 y.o. adult with a hx of HTN, GERD, SMA type II who presented to the ED with pre-syncope ? ?She went to urgent care with these symptoms and it was suggested this may be due to an electrolyte disturbance. She was told to go to the ED. EKG showed sinus tachycardia HR 100 bpm, Qtc 495. A tropoinin was 2. She feels elevated heart rate. She was recently treated with prednisone. On Sunday afternoon she fainted. She was using the restroom when it happened. She started to feel strange and groggy and weak. Her pallor became white. She felt tingling and hotness and she went limp. Paramedics came 110/70 mmHg. Her vitals were normal. She's felt tired and weak. Her hands and feet feel cold and nausea. Her appetite is down. Feeling not like herself. She feels brain fog. No prior syncopal episode. Notes some feeling of fast heart rates. She started new BP meds olmasartan. She started crestor on the 12th of October. She has family hx of HLD and HTN. She started Evrysdi.for SMA on October 21st managed by neurology at [facility].  No notable cardiotoxicity.No family hx of arrhythmia. No family hx of heart disease. Great grandmother had congestive heart failure and her great aunt. No chest pain, dyspnea, no LE edema. No orthopnea or PND.  ? ? ?EKG 05/12/2021 sinus tachycardia, Qtc 495 ms ?EKG 01/13/2017 sinus rhythm, Qtc 492 ms ? ?02/26/2021 ?LDL 142  ?TG 93 ?TC 197 ?Crt 0.1 ?K 3.9 ?TSH 1.3 ?A1c 5.0% ? ? ?Interim Hx: ?*** ? ?Past Medical History:  ?Diagnosis Date  ? Allergy   ? Anxiety   ? Asthma   ? Depression, major, single episode, moderate ([place]) 09/05/2017  ? GERD (gastroesophageal reflux disease)   ?  Hypertension 05/07/2021  ? Neuromuscular disorder ([place])   ? Osteomyelitis [facility]) June 2011  ? Otitis externa, eczematoid 05/28/2019  ? Perennial allergic rhinitis   ? Seborrheic dermatitis of scalp 2006  ? Spinal muscle atrophy ([place])   ? type 2/3 18 months  ? ? ?Past Surgical History:  ?Procedure Laterality Date  ? bilateral hip reinsertion  under age 37  ? hips out of socket, pt was only able to cruise hold on and move short distances   ? BONE BIOPSY    ? spinal bone   ? MIRENA PLACED 10/27/10    ? SPINAL FUSION  1999  ? rods placed in thoracolumbar spine to excessive scoliosis primarily   ? SPINE SURGERY N/A   ? Phreesia 07/29/2020  ? ? ?Current Medications: ?No outpatient medications have been marked as taking for the 10/13/21 encounter (Appointment) with Janina Mayo, MD.  ?  ? ?Allergies:   Other and Ciprofloxacin  ? ?Social History  ? ?Socioeconomic History  ? Marital status: Single  ?  Spouse name: Not on file  ? Number of children: Not on file  ? Years of education: Not on file  ? Highest education level: Associate degree: academic program  ?Occupational History  ? Occupation: student  ?Tobacco Use  ? Smoking status: Never  ? Smokeless tobacco: Never  ?Vaping Use  ? Vaping Use: Never  used  ?Substance and Sexual Activity  ? Alcohol use: No  ? Drug use: No  ? Sexual activity: Never  ?Other Topics Concern  ? Not on file  ?Social History Narrative  ? 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[place]    ? ?Social Determinants of Health  ? ?Financial Resource Strain: Low Risk   ? Difficulty of Paying Living Expenses: Not hard at all  ?Food Insecurity: No Food Insecurity  ? Worried About Charity fundraiser in the Last Year: Never true  ? Ran Out of Food in the Last Year: Never true  ?Transportation Needs: No Transportation Needs  ? Lack of Transportation (Medical): No  ? Lack of Transportation (Non-Medical): No  ?Physical Activity: Inactive  ? Days of Exercise per Week: 0 days  ? Minutes of Exercise per  Session: 0 min  ?Stress: No Stress Concern Present  ? Feeling of Stress : Only a little  ?Social Connections: Socially Isolated  ? Frequency of Communication with Friends and Family: More than three times a week  ? Frequency of Social Gatherings with Friends and Family: Twice a week  ? Attends Religious Services: Never  ? Active Member of Clubs or Organizations: No  ? Attends Archivist Meetings: Never  ? Marital Status: Never married  ?  ? ?Family History: ?The patient's family history includes Anxiety disorder in [place]. [place]'s brother; Depression in [place] brother; Diabetes in [place] brother; Drug abuse in [place]. [place]'s brother; Hyperlipidemia in [place]. Bertone's mother; Hypertension in [place]. Klas's brother and mother. ? ?ROS:   ?Please see the history of present illness.    ? All other systems reviewed and are negative. ? ?EKGs/Labs/Other Studies Reviewed:   ? ?The following studies were reviewed today: ? ? ?EKG:   ?Per above ? ?Recent Labs: ?02/26/2021: TSH 1.340 ?05/09/2021: Hemoglobin 13.2; Platelets 266 ?09/23/2021: ALT 16; BUN 7; Creatinine, Ser 0.21; Potassium 5.1; Sodium 139  ?Recent Lipid Panel ?   ?Component Value Date/Time  ? CHOL 134 09/23/2021 1546  ? TRIG 90 09/23/2021 1546  ? HDL 37 (L) 09/23/2021 1546  ? CHOLHDL 3.6 09/23/2021 1546  ? CHOLHDL 4.6 10/06/2018 1353  ? VLDL 12 02/08/2017 1450  ? [place] 80 09/23/2021 1546  ? [place] 140 (H) 10/06/2018 1353  ? ? ? ?Risk Assessment/Calculations:   ?  ? ? ?Physical Exam:   ? ?VS:   ? ? ?Wt Readings from Last 3 Encounters:  ?06/03/21 152 lb (68.9 kg)  ?02/23/20 152 lb (68.9 kg)  ?01/22/20 152 lb (68.9 kg)  ?  ? ?GEN:  Well nourished, well developed in no acute distress. In a motorized wheel chair ?HEENT: Normal ?NECK: No JVD; No carotid bruits ?LYMPHATICS: No lymphadenopathy ?CARDIAC: RRR, no murmurs, rubs, gallops ?RESPIRATORY:  Normal wob, Clear to auscultation  ?ABDOMEN: Soft, non-tender,  non-distended ?MUSCULOSKELETAL:  No edema; No deformity  ?SKIN: Warm and dry ?NEUROLOGIC:  Alert and oriented x 3 ?PSYCHIATRIC:  Normal affect  ? ?ASSESSMENT:   ? ?#Pre-Syncope: Her symptoms are c/w vasovagal syncope. However considering hx of SMA and prolonged Qtc will obtain a cardiac event monitor and TTE. Arrhythmia and cardiomyopathy risk is increased in patients with SMA. I told her to avoid Qtc prolonging medications and provided a hand out for oral medications that prolong the QT. Her echo was normal. Ziopatch unremarkable. ? ? ?PLAN:   ? ?In order of problems listed above: ? ?Follow up in one  year ? ?   ? ?Medication Adjustments/Labs and Tests Ordered: ?Current medicines are reviewed at length with the patient today.  Concerns regarding medicines are outlined above.  ? ?Signed, ?Janina Mayo, MD  ?10/13/2021 1:20 PM    ?[place] ? ?

## 2021-10-25 DIAGNOSIS — J4540 Moderate persistent asthma, uncomplicated: Secondary | ICD-10-CM | POA: Diagnosis not present

## 2021-10-25 DIAGNOSIS — J4541 Moderate persistent asthma with (acute) exacerbation: Secondary | ICD-10-CM | POA: Diagnosis not present

## 2021-10-29 ENCOUNTER — Telehealth: Payer: Self-pay

## 2021-10-29 NOTE — Telephone Encounter (Signed)
LVM with Kieth Brightly we have received paperwork and are waiting for provider signature since she was out of the office last week  ?

## 2021-10-29 NOTE — Telephone Encounter (Signed)
Kieth Brightly called from [employer] # [phone]. Did we received paperwork on patient power wheelchair repair if so faxed back to [phone]. Kieth Brightly has a question. Please return her call.  ?

## 2021-11-04 ENCOUNTER — Encounter: Payer: Self-pay | Admitting: Family Medicine

## 2021-11-05 ENCOUNTER — Ambulatory Visit (INDEPENDENT_AMBULATORY_CARE_PROVIDER_SITE_OTHER): Payer: Medicare Other | Admitting: Family Medicine

## 2021-11-05 ENCOUNTER — Encounter: Payer: Self-pay | Admitting: Family Medicine

## 2021-11-05 DIAGNOSIS — M62838 Other muscle spasm: Secondary | ICD-10-CM

## 2021-11-05 NOTE — Telephone Encounter (Signed)
Appointment scheduled for Thursday, 11/06/2021, if any no show or cancellation today , will contact patient as a work in. ?

## 2021-11-05 NOTE — Progress Notes (Signed)
No visit conducted , unable to contact pt despite severl attempts, visit rescheduled ?

## 2021-11-05 NOTE — Telephone Encounter (Signed)
Appt scheduled

## 2021-11-06 ENCOUNTER — Ambulatory Visit (INDEPENDENT_AMBULATORY_CARE_PROVIDER_SITE_OTHER): Payer: Medicare Other | Admitting: Family Medicine

## 2021-11-06 ENCOUNTER — Encounter: Payer: Self-pay | Admitting: Family Medicine

## 2021-11-06 DIAGNOSIS — R252 Cramp and spasm: Secondary | ICD-10-CM | POA: Diagnosis not present

## 2021-11-06 DIAGNOSIS — I10 Essential (primary) hypertension: Secondary | ICD-10-CM | POA: Diagnosis not present

## 2021-11-06 NOTE — Patient Instructions (Addendum)
F/U as before , call if you need me sooner ? ?Please come tomorrow to get non fasting vit d, chem 7 and EGFR, Magnesium and cK level due to muscle cramps ? ?Ensure you drink 64 oz water daily and start eating vegetables! ? ?Thanks for choosing [facility], we consider it a privelige to serve you. ? ?

## 2021-11-06 NOTE — Patient Instructions (Signed)
Visit cancelled , and rescheduled, unable to make contact ?

## 2021-11-06 NOTE — Progress Notes (Signed)
Virtual Visit via Telephone Note ? ?I connected with Janet Acevedo on 11/06/21 at  4:20 PM EDT by telephone and verified that I am speaking with the correct person using two identifiers. ? ?Location: ?Patient: home ?Provider: office ?  ?I discussed the limitations, risks, security and privacy concerns of performing an evaluation and management service by telephone and the availability of in person appointments. I also discussed with the patient that there may be a patient responsible charge related to this service. The patient expressed understanding and agreed to proceed. ? ? ?History of Present Illness: ?2 week h/o muscle  cramps in legs and spasm, wonders if dehydrated, and want to know if potassium is low ?  ?Observations/Objective: ? ?There were no vitals taken for this visit. ?Good communication with no confusion and intact memory. ?Alert and oriented x 3 ?Feeling better since increasing water intake ?No signs of respiratory distress during speech ? ?Assessment and Plan: ? ?Muscle cramps ?Need  To check on potassium, vit D and magnesium levels ? ? ?Hypertension ?DASH diet and commitment to daily physical activity for a minimum of 30 minutes discussed and encouraged, as a part of hypertension management. ?The importance of attaining a healthy weight is also discussed. ?Currently controlled ? ? ?  09/23/2021  ?  2:17 PM 06/18/2021  ?  1:53 PM 06/03/2021  ?  4:07 PM 05/09/2021  ?  9:30 PM 05/09/2021  ?  6:39 PM 05/07/2021  ?  3:32 PM 02/23/2020  ? 11:29 AM  ?BP/Weight  ?Systolic BP 196 222 979 892 159 144 108  ?Diastolic BP 84 80 83 119 417 105 78  ?Wt. (Lbs)   152    152  ?BMI   23.81 kg/m2    23.81 kg/m2  ? ? ? ? ? ?Follow Up Instructions: ? ?  ?I discussed the assessment and treatment plan with the patient. The patient was provided an opportunity to ask questions and all were answered. The patient agreed with the plan and demonstrated an understanding of the instructions. ?  ?The patient was advised to call back  or seek an in-person evaluation if the symptoms worsen or if the condition fails to improve as anticipated. ? ?I provided  7 minutes of non-face-to-face time during this encounter. ? ? ?Tula Nakayama, MD ? ?

## 2021-11-09 NOTE — Assessment & Plan Note (Signed)
DASH diet and commitment to daily physical activity for a minimum of 30 minutes discussed and encouraged, as a part of hypertension management. ?The importance of attaining a healthy weight is also discussed. ?Currently controlled ? ? ?  09/23/2021  ?  2:17 PM 06/18/2021  ?  1:53 PM 06/03/2021  ?  4:07 PM 05/09/2021  ?  9:30 PM 05/09/2021  ?  6:39 PM 05/07/2021  ?  3:32 PM 02/23/2020  ? 11:29 AM  ?BP/Weight  ?Systolic BP 484 720 721 828 159 144 108  ?Diastolic BP 84 80 83 833 744 105 78  ?Wt. (Lbs)   152    152  ?BMI   23.81 kg/m2    23.81 kg/m2  ? ? ? ? ?

## 2021-11-09 NOTE — Assessment & Plan Note (Signed)
Need  To check on potassium, vit D and magnesium levels ? ?

## 2021-11-11 ENCOUNTER — Ambulatory Visit: Payer: Medicare Other | Admitting: Internal Medicine

## 2021-11-12 ENCOUNTER — Ambulatory Visit: Payer: Medicare Other | Admitting: Physician Assistant

## 2021-11-14 ENCOUNTER — Other Ambulatory Visit: Payer: Self-pay | Admitting: Family Medicine

## 2021-11-15 ENCOUNTER — Other Ambulatory Visit: Payer: Self-pay | Admitting: Family Medicine

## 2021-11-16 ENCOUNTER — Telehealth: Payer: Medicare Other | Admitting: Family

## 2021-11-16 DIAGNOSIS — J4521 Mild intermittent asthma with (acute) exacerbation: Secondary | ICD-10-CM | POA: Diagnosis not present

## 2021-11-16 MED ORDER — ALBUTEROL SULFATE 1.25 MG/3ML IN NEBU: 1.2500 mg | INHALATION_SOLUTION | RESPIRATORY_TRACT | 1 refills | Status: DC | PRN

## 2021-11-16 MED ORDER — PREDNISONE 20 MG PO TABS: 40.0000 mg | ORAL_TABLET | Freq: Every day | ORAL | 0 refills | Status: AC

## 2021-11-16 NOTE — Progress Notes (Signed)
Visit for Asthma ? ?Based on what you have shared with me, it looks like you may have a flare up of your asthma.  Asthma is a chronic (ongoing) lung disease which results in airway obstruction, inflammation and hyper-responsiveness.  ? ?Asthma symptoms vary from person to person, with common symptoms including nighttime awakening and decreased ability to participate in normal activities as a result of shortness of breath. It is often triggered by changes in weather, changes in the season, changes in air temperature, or inside (home, school, daycare or work) allergens such as animal dander, mold, mildew, woodstoves or cockroaches.   It can also be triggered by hormonal changes, extreme emotion, physical exertion or an upper respiratory tract illness.    ? ?It is important to identify the trigger, and then eliminate or avoid the trigger if possible.  ? ?If you have been prescribed medications to be taken on a regular basis, it is important to follow the asthma action plan and to follow guidelines to adjust medication in response to increasing symptoms of decreased peak expiratory flow rate ? ?Treatment: ?I have prescribed: Albuterol (Proventil) (2.5 mg in 3 mL) 0.083 % Take 82ms by nebulization solution every six hours as needed for wheezing or shortness of breath and Prednisone '40mg'$  by mouth per day for  7 days ? ?HOME CARE ?Only take medications as instructed by your medical team. ?Consider wearing a mask or scarf to improve breathing air temperature have been shown to decrease irritation and decrease exacerbations ?Get rest. ?Taking a steamy shower or using a humidifier may help nasal congestion sand ease sore throat pain. You can place a towel over your head and breathe in the steam from hot water coming from a faucet. ?Using a saline nasal spray works much the same way.  ?Cough drops, hare candies and  sore throat lozenges may ease your cough.  ?Avoid close contacts especially the very you and the elderly ?Cover your mouth if you cough or sneeze ?Always remember to wash your hands.  ? ? ?GET HELP RIGHT AWAY IF: ?You develop worsening symptoms; breathlessness at rest, drowsy, confused or agitated, unable to speak in full sentences ?You have coughing fits ?You develop a severe headache or visual changes ?You develop shortness of breath, difficulty breathing or start having chest pain ?Your symptoms persist after you have completed your treatment plan ?If your symptoms do not improve within 10 days ? ?MAKE SURE YOU ?Understand these instructions. ?Will watch your condition. ?Will get help right away if you are not doing well or get worse.  ? ?Your e-visit answers were reviewed by a board certified advanced clinical practitioner to complete your personal care plan, Depending upon the condition, your plan could have included both over the counter or prescription medications.  ? ?Please review your pharmacy choice. ?Your safety is important to [place] If you have drug allergies check your prescription carefully. ? ?You can use MyChart to ask questions about today's visit, request a non-urgent  ?call back, or ask for a work or school excuse for 24 hours related to this e-Visit. If it has been greater than 24 hours you will need to follow up with your provider, or enter a new e-Visit to address those concerns.  ? ?You will get an e-mail in the next two days asking about your experience. I hope that your e-visit has been valuable and will speed your recovery. Thank you for using e-visits. ? ?Approximately 5 minutes was spent documenting and reviewing patient's chart.  ? ?

## 2021-11-20 DIAGNOSIS — J4540 Moderate persistent asthma, uncomplicated: Secondary | ICD-10-CM | POA: Diagnosis not present

## 2021-11-20 DIAGNOSIS — J4541 Moderate persistent asthma with (acute) exacerbation: Secondary | ICD-10-CM | POA: Diagnosis not present

## 2021-11-24 DIAGNOSIS — J4540 Moderate persistent asthma, uncomplicated: Secondary | ICD-10-CM | POA: Diagnosis not present

## 2021-11-24 DIAGNOSIS — J4541 Moderate persistent asthma with (acute) exacerbation: Secondary | ICD-10-CM | POA: Diagnosis not present

## 2021-11-29 ENCOUNTER — Telehealth (INDEPENDENT_AMBULATORY_CARE_PROVIDER_SITE_OTHER): Payer: Medicare Other | Admitting: Urgent Care

## 2021-11-29 DIAGNOSIS — S3992XA Unspecified injury of lower back, initial encounter: Secondary | ICD-10-CM | POA: Diagnosis not present

## 2021-11-29 MED ORDER — BACLOFEN 10 MG PO TABS: 10.0000 mg | ORAL_TABLET | Freq: Three times a day (TID) | ORAL | 0 refills | Status: AC

## 2021-11-29 MED ORDER — ETODOLAC 300 MG PO CAPS: 300.0000 mg | ORAL_CAPSULE | Freq: Two times a day (BID) | ORAL | 0 refills | Status: AC

## 2021-11-29 NOTE — Progress Notes (Signed)
We are sorry that you are not feeling well.  Here is how we plan to help! ? ?Based on what you have shared with me it looks like you mostly have a contusion of the tailbone.  ? ?I have prescribed Etodolac 300 mg take one by mouth twice a day non-steroid anti-inflammatory (NSAID) as well as Baclofen 10 mg every eight hours as needed which is a muscle relaxer  Some patients experience stomach irritation or in increased heartburn with anti-inflammatory drugs.  Please keep in mind that muscle relaxer's can cause fatigue and should not be taken while at work or driving.  Back pain is very common.  The pain often gets better over time.  The cause of back pain is usually not dangerous.  Most people can learn to manage their back pain on their own. ? ?Use or purchase a donut cushion to raise your tailbone off a chair. This may help prevent excessive pressure on the affected area. If you don't have one, you can also roll up a towel and create a U shape with it to sit on, again to prevent excessive pressure on the tailbone. If your symptoms continue or worsen despite the treatment offered today, a further workup including imaging may be warranted. ? ?Home Care ?Stay active.  Start with short walks on flat ground if you can.  Try to walk farther each day. ?Do not sit, drive or stand in one place for more than 30 minutes.  Do not stay in bed. ?Do not avoid exercise or work.  Activity can help your back heal faster. ?Be careful when you bend or lift an object.  Bend at your knees, keep the object close to you, and do not twist. ?Sleep on a firm mattress.  Lie on your side, and bend your knees.  If you lie on your back, put a pillow under your knees. ?Only take medicines as told by your doctor. ?Put ice on the injured area. ?Put ice in a plastic bag ?Place a towel between your skin and the bag ?Leave the ice on for 15-20 minutes, 3-4 times a day for the first 2-3 days. 210 After that, you can switch between ice and heat  packs. ?Ask your doctor about back exercises or massage. ?Avoid feeling anxious or stressed.  Find good ways to deal with stress, such as exercise. ? ?Get Help Right Way If: ?Your pain does not go away with rest or medicine. ?Your pain does not go away in 1 week. ?You have new problems. ?You do not feel well. ?The pain spreads into your legs. ?You cannot control when you poop (bowel movement) or pee (urinate) ?You feel sick to your stomach (nauseous) or throw up (vomit) ?You have belly (abdominal) pain. ?You feel like you may pass out (faint). ?If you develop a fever. ? ?Make Sure you: ?Understand these instructions. ?Will watch your condition ?Will get help right away if you are not doing well or get worse. ? ?Your e-visit answers were reviewed by a board certified advanced clinical practitioner to complete your personal care plan.  Depending on the condition, your plan could have included both over the counter or prescription medications. ? ?If there is a problem please reply  once you have received a response from your provider. ? ?Your safety is important to [place].  If you have drug allergies check your prescription carefully.   ? ?You can use MyChart to ask questions about today?s visit, request a non-urgent call back, or ask  for a work or school excuse for 24 hours related to this e-Visit. If it has been greater than 24 hours you will need to follow up with your provider, or enter a new e-Visit to address those concerns. ? ?You will get an e-mail in the next two days asking about your experience.  I hope that your e-visit has been valuable and will speed your recovery. Thank you for using e-visits.  ? ?I have spent 9 minutes in review of e-visit questionnaire, review and updating patient chart, medical decision making and response to patient.  ? ?Ambrosio Reuter L Laberta Wilbon, PA ? ?  ?

## 2021-12-01 ENCOUNTER — Ambulatory Visit: Payer: Medicare Other | Admitting: Internal Medicine

## 2021-12-22 ENCOUNTER — Other Ambulatory Visit: Payer: Self-pay | Admitting: Family Medicine

## 2021-12-23 ENCOUNTER — Ambulatory Visit: Payer: Medicare Other | Admitting: Internal Medicine

## 2021-12-25 ENCOUNTER — Encounter: Payer: Self-pay | Admitting: Family Medicine

## 2021-12-25 DIAGNOSIS — J4541 Moderate persistent asthma with (acute) exacerbation: Secondary | ICD-10-CM | POA: Diagnosis not present

## 2021-12-25 DIAGNOSIS — J4540 Moderate persistent asthma, uncomplicated: Secondary | ICD-10-CM | POA: Diagnosis not present

## 2021-12-26 NOTE — Telephone Encounter (Signed)
LVM to schedule tele visit

## 2022-01-06 ENCOUNTER — Encounter: Payer: Self-pay | Admitting: Internal Medicine

## 2022-01-06 ENCOUNTER — Ambulatory Visit (INDEPENDENT_AMBULATORY_CARE_PROVIDER_SITE_OTHER): Payer: Medicare Other | Admitting: Internal Medicine

## 2022-01-06 VITALS — BP 128/88 | HR 76 | Ht 67.0 in

## 2022-01-06 DIAGNOSIS — R55 Syncope and collapse: Secondary | ICD-10-CM | POA: Diagnosis not present

## 2022-01-06 NOTE — Patient Instructions (Signed)
Medication Instructions:  No Changes In Medications at this time.  *If you need a refill on your cardiac medications before your next appointment, please call your pharmacy*  Lab Work: None Ordered At This Time.  If you have labs (blood work) drawn today and your tests are completely normal, you will receive your results only by: MyChart Message (if you have MyChart) OR A paper copy in the mail If you have any lab test that is abnormal or we need to change your treatment, we will call you to review the results.  Testing/Procedures: None Ordered At This Time.   Follow-Up: At [facility], you and your health needs are our priority.  As part of our continuing mission to provide you with exceptional heart care, we have created designated Provider Care Teams.  These Care Teams include your primary Cardiologist (physician) and Advanced Practice Providers (APPs -  Physician Assistants and Nurse Practitioners) who all work together to provide you with the care you need, when you need it.  Your next appointment:   1 year(s)  The format for your next appointment:   In Person  Provider:   Branch, Mary E, MD          

## 2022-01-06 NOTE — Progress Notes (Signed)
Cardiology Office Note:    Date:  01/06/2022   ID:  Janet Acevedo, DOB July 19, 1991, MRN [id number]  PCP:  Fayrene Helper, MD   [facility] Providers Cardiologist:  None     Referring MD: Fayrene Helper, MD   No chief complaint on file. Pre-Syncope  History of Present Illness:    Janet Acevedo is a 31 y.o. adult with a hx of HTN, GERD, SMA type II who presented to the ED with pre-syncope  She went to urgent care with these symptoms and it was suggested this may be due to an electrolyte disturbance. She was told to go to the ED. EKG showed sinus tachycardia HR 100 bpm, Qtc 495. A tropoinin was 2. She feels elevated heart rate. She was recently treated with prednisone. On Sunday afternoon she fainted. She was using the restroom when it happened. She started to feel strange and groggy and weak. Her pallor became white. She felt tingling and hotness and she went limp. Paramedics came 110/70 mmHg. Her vitals were normal. She's felt tired and weak. Her hands and feet feel cold and nausea. Her appetite is down. Feeling not like herself. She feels brain fog. No prior syncopal episode. Notes some feeling of fast heart rates. She started new BP meds olmasartan. She started crestor on the 12th of October. She has family hx of HLD and HTN. She started Evrysdi.for SMA on October 21st managed by neurology at [facility].  No notable cardiotoxicity.No family hx of arrhythmia. No family hx of heart disease. Great grandmother had congestive heart failure and her great aunt. No chest pain, dyspnea, no LE edema. No orthopnea or PND.    EKG 05/12/2021 sinus tachycardia, Qtc 495 ms EKG 01/13/2017 sinus rhythm, Qtc 492 ms  Interim Hx 01/06/2022: Janet Acevedo is doing well. She's hoping to get outside more for the summer. She denies recurrent  syncopal events.  No chest pain or sob.   02/26/2021 LDL 142  TG 93 TC 197 Crt 0.1 K 3.9 TSH 1.3 A1c 5.0%   Cardiac Studies TTE 07/23/2021 - Normal  Ziopatch-  4 beat run of NSVT  Past Medical History:  Diagnosis Date   Allergy    Anxiety    Asthma    Depression, major, single episode, moderate (HCC) 09/05/2017   GERD (gastroesophageal reflux disease)    Hypertension 05/07/2021   Neuromuscular disorder ([place])    Osteomyelitis ([place]) June 2011   Otitis externa, eczematoid 05/28/2019   Perennial allergic rhinitis    Seborrheic dermatitis of scalp 2006   Spinal muscle atrophy ([place])    type 2/3 18 months    Past Surgical History:  Procedure Laterality Date   bilateral hip reinsertion  under age 47   hips out of socket, pt was only able to cruise hold on and move short distances    BONE BIOPSY     spinal bone    MIRENA PLACED 10/27/10     SPINAL FUSION  1999   rods placed in thoracolumbar spine to excessive scoliosis primarily    [place] 07/29/2020    Current Medications: No outpatient medications have been marked as taking for the 01/06/22 encounter (Appointment) with Janina Mayo, MD.     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  (01/22/2021)   Overall Financial Resource Strain (CARDIA)    Difficulty of Paying Living Expenses: Not hard at all  Food Insecurity: No Food Insecurity (01/22/2021)   Hunger Vital Sign    Worried About Running Out of Food in the  Last Year: Never true    [place] in the Last Year: Never true  Transportation Needs: No Transportation Needs (01/22/2021)   PRAPARE - Hydrologist (Medical): No    Lack of Transportation (Non-Medical): No  Physical Activity: Inactive (01/22/2021)   Exercise Vital Sign    Days of Exercise per Week: 0 days    Minutes of Exercise per Session: 0 min  Stress: No Stress Concern Present (01/22/2021)   [place]    Feeling of Stress : Only a little  Social Connections: Socially Isolated (01/22/2021)   Social Connection and Isolation Panel [NHANES]    Frequency of Communication with Friends and Family: More than three times a week    Frequency of Social Gatherings with Friends and Family: Twice a week    Attends Religious Services: Never    Marine scientist or Organizations: No    Attends Music therapist: Never    Marital Status: Never married     Family History: The patient's family history includes Anxiety disorder in [place] brother; Depression in [place] brother; Diabetes in [place] brother; Drug abuse in [place] brother; Hyperlipidemia in [place]. [place]'s mother; Hypertension in [place] brother and mother.  ROS:   Please see the history of present illness.     All other systems reviewed and are negative.  EKGs/Labs/Other Studies Reviewed:    The following studies were reviewed today:   EKG:   Per above  Recent Labs: 02/26/2021: TSH 1.340 05/09/2021: Hemoglobin 13.2; Platelets 266 09/23/2021: ALT 16; BUN 7; Creatinine, Ser 0.21; Potassium 5.1; Sodium 139  Recent Lipid Panel    Component Value Date/Time   CHOL 134 09/23/2021 1546   TRIG 90 09/23/2021 1546   HDL 37 (L) 09/23/2021 1546   CHOLHDL 3.6 09/23/2021 1546   CHOLHDL 4.6 10/06/2018 1353   VLDL 12 02/08/2017 1450   LDLCALC 80 09/23/2021 1546   LDLCALC  140 (H) 10/06/2018 1353     Risk Assessment/Calculations:       Physical Exam:    VS:  There were no vitals taken for this visit.    Wt Readings from Last 3 Encounters:  06/03/21 152 lb (68.9 kg)  02/23/20 152 lb (68.9 kg)  01/22/20 152 lb (68.9 kg)     GEN:  Well nourished, well developed in no acute distress. In a motorized wheel chair HEENT: Normal NECK: No JVD;  LYMPHATICS: No lymphadenopathy CARDIAC: RRR, no murmurs, rubs, gallops RESPIRATORY:  Normal wob, Clear to auscultation  ABDOMEN:  Soft, non-tender, non-distended MUSCULOSKELETAL:  No edema; No deformity  SKIN: Warm and dry NEUROLOGIC:  Alert and oriented x 3 PSYCHIATRIC:  Normal affect   ASSESSMENT:    #Pre-Syncope: Her symptoms were c/w vasovagal syncope. However considering hx of SMA and prolonged Qtc, obtained a cardiac event monitor and TTE. Arrhythmia and cardiomyopathy risk is increased in patients with SMA. Her zio did not show any concerning rhythm abnormalities. She has no structural heart disease on TTE. Recommend Avoiding QTc prolonging medications and provided a hand out for oral medications that prolong the QT in the past.    PLAN:    In order of problems listed above:  Follow up in one year Avoid Qtc prolonging medications         Medication Adjustments/Labs and Tests Ordered: Current medicines are reviewed at length with the patient today.  Concerns regarding medicines are outlined above.   Signed, Janina Mayo, MD  01/06/2022 1:42 PM    [facility]

## 2022-01-21 ENCOUNTER — Telehealth: Payer: Self-pay | Admitting: Family Medicine

## 2022-01-21 ENCOUNTER — Encounter: Payer: Self-pay | Admitting: Family Medicine

## 2022-01-21 ENCOUNTER — Ambulatory Visit (INDEPENDENT_AMBULATORY_CARE_PROVIDER_SITE_OTHER): Payer: Medicare Other | Admitting: Family Medicine

## 2022-01-21 DIAGNOSIS — G121 Other inherited spinal muscular atrophy: Secondary | ICD-10-CM

## 2022-01-21 DIAGNOSIS — L219 Seborrheic dermatitis, unspecified: Secondary | ICD-10-CM

## 2022-01-21 DIAGNOSIS — J302 Other seasonal allergic rhinitis: Secondary | ICD-10-CM | POA: Diagnosis not present

## 2022-01-21 DIAGNOSIS — E559 Vitamin D deficiency, unspecified: Secondary | ICD-10-CM | POA: Diagnosis not present

## 2022-01-21 DIAGNOSIS — J4540 Moderate persistent asthma, uncomplicated: Secondary | ICD-10-CM | POA: Diagnosis not present

## 2022-01-21 DIAGNOSIS — K219 Gastro-esophageal reflux disease without esophagitis: Secondary | ICD-10-CM | POA: Diagnosis not present

## 2022-01-21 DIAGNOSIS — I10 Essential (primary) hypertension: Secondary | ICD-10-CM

## 2022-01-21 DIAGNOSIS — F321 Major depressive disorder, single episode, moderate: Secondary | ICD-10-CM

## 2022-01-21 DIAGNOSIS — R002 Palpitations: Secondary | ICD-10-CM | POA: Diagnosis not present

## 2022-01-21 DIAGNOSIS — F411 Generalized anxiety disorder: Secondary | ICD-10-CM

## 2022-01-21 NOTE — Patient Instructions (Signed)
Annual exam in 4 months, call if you need me sooner  Need fasting cBC, lipid , cmp and EGFR, TSH and vit d in the next 1 week  Please check on therapist and let me know who you want to be referred to  Thankful that you are doing better

## 2022-01-21 NOTE — Progress Notes (Signed)
Virtual Visit via Video Note  I connected with Janet Acevedo on 01/21/22 at  2:00 PM EDT by a video enabled telemedicine application and verified that I am speaking with the correct person using two identifiers.  Location: Patient: home Provider: office   I discussed the limitations of evaluation and management by telemedicine and the availability of in person appointments. The patient expressed understanding and agreed to proceed.  History of Present Illness: F/U chronic problems, med review and update record. States doing better overall. Still needs to find a therapist. Denies uncontrolled depression or anxiety Breathing stable Tolerating medication for muscular dystrophy with no adverse s/e  Has been released from Cardiology for 1 year as blood pressure and palpitations are controlled   Observations/Objective: There were no vitals taken for this visit. Good communication with no confusion and intact memory. Alert and oriented x 3 No signs of respiratory distress during speech   Assessment and Plan: Hypertension Controlled, no change in medication   Moderate persistent asthma Stable and controlled on curren tmeds, continue same  Seborrheic dermatitis of scalp Controlled, no change in medication   Seasonal allergies Controlled, no change in medication   Palpitations Controlled, no change in medication   Type II spinal muscular atrophy ([place]) Managed at [facility] , currently being treated  Vitamin D deficiency Updated lab needed at/ before next visit. Continue weekly supplement  GERD Controlled, no change in medication   GAD (generalized anxiety disorder) Improved, still needs therapist , she will lwt me know who she decides on  Depression, major, single episode, moderate ([place]) Improved, continue current meds, wants therapy not suicidal or homicidal   Follow Up Instructions:    I discussed the assessment and treatment plan with the patient. The patient was  provided an opportunity to ask questions and all were answered. The patient agreed with the plan and demonstrated an understanding of the instructions.   The patient was advised to call back or seek an in-person evaluation if the symptoms worsen or if the condition fails to improve as anticipated.  I provided 22 minutes of non-face-to-face time during this encounter.   Tula Nakayama, MD

## 2022-01-21 NOTE — Telephone Encounter (Signed)
Pt is scheduled for a phone visit. She is aware

## 2022-01-21 NOTE — Telephone Encounter (Signed)
Patient return call. ?

## 2022-01-24 DIAGNOSIS — J4541 Moderate persistent asthma with (acute) exacerbation: Secondary | ICD-10-CM | POA: Diagnosis not present

## 2022-01-24 DIAGNOSIS — J4540 Moderate persistent asthma, uncomplicated: Secondary | ICD-10-CM | POA: Diagnosis not present

## 2022-01-26 ENCOUNTER — Ambulatory Visit (INDEPENDENT_AMBULATORY_CARE_PROVIDER_SITE_OTHER): Payer: Medicare Other

## 2022-01-26 ENCOUNTER — Telehealth: Payer: Self-pay | Admitting: Family Medicine

## 2022-01-26 DIAGNOSIS — Z0000 Encounter for general adult medical examination without abnormal findings: Secondary | ICD-10-CM | POA: Diagnosis not present

## 2022-01-26 NOTE — Patient Instructions (Signed)
    Janet Acevedo , Thank you for taking time to come for your Medicare Wellness Visit. I appreciate your ongoing commitment to your health goals. Please review the following plan we discussed and let me know if I can assist you in the future.   These are the goals we discussed:  Goals      DIET - EAT MORE FRUITS AND VEGETABLES     DIET - INCREASE WATER INTAKE        This is a list of the screening recommended for you and due dates:  Health Maintenance  Topic Date Due   Pap Smear  Never done   COVID-19 Vaccine (3 - Moderna series) 01/15/2020   Flu Shot  02/24/2022   Tetanus Vaccine  07/27/2025   Hepatitis C Screening: USPSTF Recommendation to screen - Ages 18-79 yo.  Completed   HIV Screening  Completed   HPV Vaccine  Aged Out

## 2022-01-26 NOTE — Progress Notes (Signed)
Subjective:   Janet Acevedo is a 31 y.o. female who presents for Medicare Annual (Subsequent) preventive examination. I connected with  [name] on 01/26/22 by a audio enabled telemedicine application and verified that I am speaking with the correct person using two identifiers.  Patient Location: Home  Provider Location: Office/Clinic  I discussed the limitations of evaluation and management by telemedicine. The patient expressed understanding and agreed to proceed.  Review of Systems           Objective:    There were no vitals filed for this visit. There is no height or weight on file to calculate BMI.     05/09/2021    6:40 PM 01/22/2021    1:26 PM 01/12/2017   11:47 PM  Advanced Directives  Does Patient Have a Medical Advance Directive? No No No  Would patient like information on creating a medical advance directive? No - Patient declined No - Patient declined     Current Medications (verified) Outpatient Encounter Medications as of 01/26/2022  Medication Sig   albuterol (ACCUNEB) 1.25 MG/3ML nebulizer solution Take 3 mLs (1.25 mg total) by nebulization every 4 (four) hours as needed. Dx J45.40   albuterol (VENTOLIN HFA) 108 (90 Base) MCG/ACT inhaler Inhale 1-2 puffs into the lungs every 6 (six) hours as needed for wheezing or shortness of breath.   cromolyn (OPTICROM) 4 % ophthalmic solution PLACE 1 DROP INTO BOTH EYES FOUR TIMES DAILY   ergocalciferol (VITAMIN D2) 1.25 MG (50000 UT) capsule Take 1 capsule (50,000 Units total) by mouth once a week. One capsule once weekly   Fluocinolone Acetonide Scalp 0.01 % OIL APPLY SPARINGLY TO SCALP TWO TIMES WEEKLY, AS NEEDED, FOR ITCH AND FLAKING   hydrocortisone 2.5 % cream APPLY TOPICALLY TWICE A DAY   ketoconazole (NIZORAL) 2 % shampoo APPLY TO SCALP FOR 5 MINUTES AND RINSE OUT   levocetirizine (XYZAL) 5 MG tablet TAKE 1 TABLET BY MOUTH EVERY DAY IN THE EVENING   metoprolol succinate (TOPROL-XL) 25 MG 24 hr tablet TAKE 1  TABLET (25 MG TOTAL) BY MOUTH DAILY.   montelukast (SINGULAIR) 10 MG tablet TAKE 1 TABLET BY MOUTH EVERY DAY   nystatin (MYCOSTATIN/NYSTOP) powder APPLY TO AFFECTED AREA 3 TIMES A DAY   olmesartan (BENICAR) 20 MG tablet TAKE 1 TABLET BY MOUTH EVERY DAY   omeprazole (PRILOSEC) 20 MG capsule TAKE 2 CAPSULES BY MOUTH TWICE DAILY - PATIENT HAS TROUBLE SWALLOWING   Respiratory Therapy Supplies (NEBULIZER/ADULT MASK) KIT Insurance preference   Respiratory Therapy Supplies (NEBULIZER/TUBING/MOUTHPIECE) KIT Use as directed. Insurance preference. Nebulizer kit and mask   Risdiplam (EVRYSDI) 0.75 MG/ML SOLR    rosuvastatin (CRESTOR) 5 MG tablet Take 1 tablet (5 mg total) by mouth daily.   sertraline (ZOLOFT) 50 MG tablet TAKE 1 TABLET BY MOUTH EVERY DAY   Spacer/Aero-Holding Chambers (AEROCHAMBER PLUS) inhaler Use as instructed   SYMBICORT 160-4.5 MCG/ACT inhaler TAKE 2 PUFFS BY MOUTH TWICE A DAY   UNABLE TO FIND Gloves- 1 box per month as needed   UNABLE TO FIND Under pads- for use daily as needed   UNABLE TO FIND Incontinence liners- use as needed for incontinence Gloves- 1 box as needed for incontinence DX urinary incontinence   UNABLE TO FIND Nebulizer machine and tubing  DX J45.40   [DISCONTINUED] Calcium Carbonate (CALCIUM 500 PO) Take 1 tablet by mouth daily.    No facility-administered encounter medications on file as of 01/26/2022.    Allergies (verified) Other and  Ciprofloxacin   History: Past Medical History:  Diagnosis Date   Allergy    Anxiety    Asthma    Depression, major, single episode, moderate ([place]) 09/05/2017   GERD (gastroesophageal reflux disease)    Hypertension 05/07/2021   Neuromuscular disorder ([place])    Osteomyelitis ([place]) June 2011   Otitis externa, eczematoid 05/28/2019   Perennial allergic rhinitis    Seborrheic dermatitis of scalp 2006   Spinal muscle atrophy (HCC)    type 2/3 18 months   Past Surgical History:  Procedure Laterality Date   bilateral hip  reinsertion  under age 14   hips out of socket, pt was only able to cruise hold on and move short distances    BONE BIOPSY     spinal bone    MIRENA PLACED 10/27/10     SPINAL FUSION  1999   rods placed in thoracolumbar spine to excessive scoliosis primarily    [place] 07/29/2020   Family History  Problem Relation Age of Onset   Hyperlipidemia Mother    Hypertension Mother    Hypertension Brother    Diabetes Brother        borderline    Anxiety disorder Brother    Depression Brother    Drug abuse Brother    Social History   Socioeconomic History   Marital status: Single    Spouse name: Not on file   Number of children: Not on file   Years of education: Not on file   Highest education level: Associate degree: academic program  Occupational History   Occupation: student  Tobacco Use   Smoking status: Never   Smokeless tobacco: Never  Vaping Use   Vaping Use: Never used  Substance and Sexual Activity   Alcohol use: No   Drug use: No   Sexual activity: Never  Other Topics Concern   Not on file  Social History Narrative   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  (01/22/2021)   Overall Financial Resource Strain (CARDIA)    Difficulty of Paying Living Expenses: Not hard at all  Food Insecurity: No Food Insecurity (01/22/2021)   Hunger Vital Sign    Worried About Running Out of Food in the Last Year: Never true    [place] in the Last Year: Never true  Transportation Needs: No Transportation Needs (01/22/2021)   PRAPARE - Hydrologist (Medical): No    Lack of Transportation (Non-Medical): No  Physical Activity: Inactive (01/22/2021)   Exercise Vital Sign    Days of Exercise per Week: 0 days    Minutes of Exercise per Session: 0 min  Stress: No Stress Concern Present (01/22/2021)   [place]    Feeling of Stress : Only a little  Social Connections: Socially Isolated (01/22/2021)   Social Connection and Isolation Panel [NHANES]    Frequency of Communication with Friends and Family: More than three times a week    Frequency of Social Gatherings with Friends and Family: Twice a week    Attends Religious Services: Never    Marine scientist or Organizations: No    Attends Archivist Meetings: Never    Marital Status: Never married    Tobacco Counseling Counseling given: Not Answered   Clinical Intake:  Diabetic?no         Activities of Daily Living     No data to display          Patient Care Team: Fayrene Helper, MD as PCP - General (Family Medicine) Harl Bowie Royetta Crochet, MD as PCP - Cardiology (Cardiology) Aretha Parrot Donnella Sham, MD as Referring Physician (Pulmonary Disease)  Indicate any recent Medical Services you may have received from other than [facility] providers in the past year (date may be approximate).     Assessment:   This is a routine wellness examination for [place].  Hearing/Vision screen No results found.  Dietary issues and exercise activities discussed:     Goals Addressed   None    Depression Screen    01/21/2022    2:08 PM 11/05/2021    9:04 AM 06/18/2021    1:53 PM 05/14/2021    1:17 PM 05/07/2021    3:26 PM 04/02/2021    2:49 PM 02/25/2021    1:51 PM  PHQ 2/9 Scores  PHQ - 2 Score 0 0 0 0 0 6 1  PHQ- 9 Score    0 3 16     Fall Risk    11/06/2021   11:29 AM 11/05/2021    9:04 AM 09/23/2021    2:18 PM 05/14/2021    1:17 PM 05/07/2021    3:26 PM  Fall Risk   Falls in the past year? Exclusion - non ambulatory 0 Exclusion - non ambulatory 0 0  Number falls in past yr:  0     Injury with Fall?  0     Risk for fall due to :  No Fall Risks     Follow up  Falls evaluation completed       [place]:  Any stairs in or  around the home? No  If so, are there any without handrails?    Home free of loose throw rugs in walkways, pet beds, electrical cords, etc? Yes  Adequate lighting in your home to reduce risk of falls? Yes   ASSISTIVE DEVICES UTILIZED TO PREVENT FALLS:  Life alert? No  Use of a cane, walker or w/c? Yes  Grab bars in the bathroom? Yes  Shower chair or bench in shower? Yes  Elevated toilet seat or a handicapped toilet? Yes   TIMED UP AND GO:  Was the test performed? No .  Length of time to ambulate 10 feet:  sec.     Cognitive Function:        01/22/2021    1:33 PM 01/22/2020    1:19 PM 12/22/2018    1:11 PM 07/12/2017    2:55 PM  6CIT Screen  What Year? 0 points 0 points 0 points 0 points  What month? 0 points 0 points 0 points 0 points  What time? 0 points 0 points 0 points 0 points  Count back from 20 0 points 0 points 0 points 0 points  Months in reverse 0 points 0 points 0 points 0 points  Repeat phrase 0 points 0 points 0 points 0 points  Total Score 0 points 0 points 0 points 0 points    Immunizations Immunization History  Administered Date(s) Administered   Influenza Split 04/07/2012   Influenza Whole 04/07/2010, 04/03/2011   Influenza,inj,Quad PF,6+ Mos 05/10/2013, 07/12/2014, 04/09/2015, 05/05/2016, 05/03/2017, 05/10/2018, 06/20/2019, 04/05/2020, 05/07/2021   Moderna Sars-Covid-2 Vaccination 10/25/2019, 11/20/2019   Pneumococcal Conjugate-13 01/20/2017    TDAP status: Up to date  Flu Vaccine status: Up to date  Pneumococcal vaccine status: Up to date  Covid-19 vaccine status: Information provided on how to obtain vaccines.   Qualifies for Shingles Vaccine? No   Zostavax completed No   Shingrix Completed?: No.    Education has been provided regarding the importance of this vaccine. Patient has been advised to call insurance company to determine out of pocket expense if they have not yet received this vaccine. Advised may also receive vaccine at local  pharmacy or Health Dept. Verbalized acceptance and understanding.  Screening Tests Health Maintenance  Topic Date Due   PAP SMEAR-Modifier  Never done   COVID-19 Vaccine (3 - Moderna series) 01/15/2020   INFLUENZA VACCINE  02/24/2022   TETANUS/TDAP  07/27/2025   Hepatitis C Screening  Completed   HIV Screening  Completed   HPV VACCINES  Aged Out    Health Maintenance  Health Maintenance Due  Topic Date Due   PAP SMEAR-Modifier  Never done   COVID-19 Vaccine (3 - Moderna series) 01/15/2020          Lung Cancer Screening: (Low Dose CT Chest recommended if Age 39-80 years, 30 pack-year currently smoking OR have quit w/in 15years.) does not qualify.   Lung Cancer Screening Referral:   Additional Screening:  Hepatitis C Screening: does not qualify; Completed 10/06/2018  Vision Screening: Recommended annual ophthalmology exams for early detection of glaucoma and other disorders of the eye. Is the patient up to date with their annual eye exam?  No  Who is the provider or what is the name of the office in which the patient attends annual eye exams? [email or web address] If pt is not established with a provider, would they like to be referred to a provider to establish care? No .   Dental Screening: Recommended annual dental exams for proper oral hygiene  Community Resource Referral / Chronic Care Management: CRR required this visit?  No   CCM required this visit?  No      Plan:     I have personally reviewed and noted the following in the patient's chart:   Medical and social history Use of alcohol, tobacco or illicit drugs  Current medications and supplements including opioid prescriptions.  Functional ability and status Nutritional status Physical activity Advanced directives List of other physicians Hospitalizations, surgeries, and ER visits in previous 12 months Vitals Screenings to include cognitive, depression, and falls Referrals and appointments  In  addition, I have reviewed and discussed with patient certain preventive protocols, quality metrics, and best practice recommendations. A written personalized care plan for preventive services as well as general preventive health recommendations were provided to patient.     Jill Side, [place]   01/26/2022   Nurse Notes:

## 2022-01-26 NOTE — Telephone Encounter (Signed)
[facility] called and said they have set  [place] Medicaid form and order for Dr Moshe Cipro to sign.  Can you call them with a update on the forms.  This is for a wheel chair repair.  [phone]

## 2022-01-27 ENCOUNTER — Encounter: Payer: Self-pay | Admitting: Family Medicine

## 2022-01-27 NOTE — Assessment & Plan Note (Signed)
Improved, still needs therapist , she will lwt me know who she decides on

## 2022-01-27 NOTE — Assessment & Plan Note (Signed)
Improved, continue current meds, wants therapy not suicidal or homicidal

## 2022-01-27 NOTE — Assessment & Plan Note (Signed)
Controlled, no change in medication  

## 2022-01-27 NOTE — Assessment & Plan Note (Signed)
Updated lab needed at/ before next visit. Continue weekly supplement

## 2022-01-27 NOTE — Assessment & Plan Note (Signed)
Stable and controlled on current meds, continue same 

## 2022-01-27 NOTE — Assessment & Plan Note (Signed)
Managed at [facility] , currently being treated

## 2022-01-28 NOTE — Telephone Encounter (Signed)
Called and will fax forms again.

## 2022-01-28 NOTE — Telephone Encounter (Signed)
They need to refax the forms. We have not received anything

## 2022-02-09 ENCOUNTER — Other Ambulatory Visit: Payer: Self-pay | Admitting: Family Medicine

## 2022-02-09 ENCOUNTER — Telehealth: Payer: Self-pay | Admitting: Family Medicine

## 2022-02-09 NOTE — Telephone Encounter (Signed)
Error message already answered

## 2022-02-13 ENCOUNTER — Other Ambulatory Visit: Payer: Self-pay | Admitting: Family Medicine

## 2022-02-13 DIAGNOSIS — R7302 Impaired glucose tolerance (oral): Secondary | ICD-10-CM | POA: Diagnosis not present

## 2022-02-13 DIAGNOSIS — Z13220 Encounter for screening for lipoid disorders: Secondary | ICD-10-CM | POA: Diagnosis not present

## 2022-02-13 DIAGNOSIS — I10 Essential (primary) hypertension: Secondary | ICD-10-CM | POA: Diagnosis not present

## 2022-02-13 DIAGNOSIS — E559 Vitamin D deficiency, unspecified: Secondary | ICD-10-CM

## 2022-02-14 LAB — CBC
Hematocrit: 41.4 % (ref 34.0–46.6)
Hemoglobin: 13.1 g/dL (ref 11.1–15.9)
MCH: 27 pg (ref 26.6–33.0)
MCHC: 31.6 g/dL (ref 31.5–35.7)
MCV: 85 fL (ref 79–97)
Platelets: 215 10*3/uL (ref 150–450)
RBC: 4.86 x10E6/uL (ref 3.77–5.28)
RDW: 13 % (ref 11.7–15.4)
WBC: 10.4 10*3/uL (ref 3.4–10.8)

## 2022-02-14 LAB — CMP14+EGFR
ALT: 20 IU/L (ref 0–32)
AST: 15 IU/L (ref 0–40)
Albumin/Globulin Ratio: 1.3 (ref 1.2–2.2)
Albumin: 4.1 g/dL (ref 4.0–5.0)
Alkaline Phosphatase: 99 IU/L (ref 44–121)
BUN/Creatinine Ratio: 39 — ABNORMAL HIGH (ref 9–23)
BUN: 9 mg/dL (ref 6–20)
Bilirubin Total: 0.3 mg/dL (ref 0.0–1.2)
CO2: 26 mmol/L (ref 20–29)
Calcium: 9.3 mg/dL (ref 8.7–10.2)
Chloride: 98 mmol/L (ref 96–106)
Creatinine, Ser: 0.23 mg/dL — ABNORMAL LOW (ref 0.57–1.00)
Globulin, Total: 3.2 g/dL (ref 1.5–4.5)
Glucose: 177 mg/dL — ABNORMAL HIGH (ref 70–99)
Potassium: 4.6 mmol/L (ref 3.5–5.2)
Sodium: 139 mmol/L (ref 134–144)
Total Protein: 7.3 g/dL (ref 6.0–8.5)
eGFR: 156 mL/min/{1.73_m2} (ref >59)

## 2022-02-14 LAB — LIPID PANEL
Chol/HDL Ratio: 3.7 ratio (ref 0.0–4.4)
Cholesterol, Total: 110 mg/dL (ref 100–199)
HDL: 30 mg/dL — ABNORMAL LOW (ref >39)
LDL Chol Calc (NIH): 64 mg/dL (ref 0–99)
Triglycerides: 75 mg/dL (ref 0–149)
VLDL Cholesterol Cal: 16 mg/dL (ref 5–40)

## 2022-02-14 LAB — VITAMIN D 25 HYDROXY (VIT D DEFICIENCY, FRACTURES): Vit D, 25-Hydroxy: 66.9 ng/mL (ref 30.0–100.0)

## 2022-02-14 LAB — TSH: TSH: 1.87 u[IU]/mL (ref 0.450–4.500)

## 2022-02-17 LAB — HGB A1C W/O EAG: Hgb A1c MFr Bld: 7.5 % — ABNORMAL HIGH (ref 4.8–5.6)

## 2022-02-17 LAB — SPECIMEN STATUS REPORT

## 2022-02-18 NOTE — Progress Notes (Signed)
Ov tele scheduled

## 2022-02-19 ENCOUNTER — Ambulatory Visit (INDEPENDENT_AMBULATORY_CARE_PROVIDER_SITE_OTHER): Payer: Medicare Other | Admitting: Family Medicine

## 2022-02-19 ENCOUNTER — Encounter: Payer: Self-pay | Admitting: Family Medicine

## 2022-02-19 DIAGNOSIS — E559 Vitamin D deficiency, unspecified: Secondary | ICD-10-CM

## 2022-02-19 DIAGNOSIS — E1169 Type 2 diabetes mellitus with other specified complication: Secondary | ICD-10-CM | POA: Diagnosis not present

## 2022-02-19 DIAGNOSIS — G121 Other inherited spinal muscular atrophy: Secondary | ICD-10-CM | POA: Diagnosis not present

## 2022-02-19 HISTORY — DX: Type 2 diabetes mellitus with other specified complication: E11.69

## 2022-02-19 MED ORDER — EMPAGLIFLOZIN 10 MG PO TABS: 10.0000 mg | ORAL_TABLET | Freq: Every day | ORAL | 3 refills | Status: DC

## 2022-02-19 NOTE — Progress Notes (Addendum)
Virtual Visit via Video Note  I connected with Janet Acevedo on 02/19/22 at 11:40 AM EDT by a video enabled telemedicine application and verified that I am speaking with the correct person using two identifiers.  Location Patient: home Provider: office   I discussed the limitations of evaluation and management by telemedicine and the availability of in person appointments. The patient expressed understanding and agreed to proceed.  History of Present Illness: Visit to discuss recent labs and her new dx of type 2 diabetes Denies polyuria, polydipsia, blurred vision , or hypoglycemic episodes. Requests and needs new bath / toilet equipment as her  current  equipment is old and worn. She is able to safely shower and toilet with equipment needed and requested. With Due to spinal muscular atrophy, [place] unable to ambulate and to stand. Needs to be attended while showering and toleting, hence she needs equipment requested for basic hygiene needs She is non ambulatory and is wheelchair confined more than 8 hours per day    Observations/Objective: There were no vitals taken for this visit. Good communication with no confusion and intact memory. Alert and oriented x 3 No signs of respiratory distress during speech   Assessment and Plan: Type 2 diabetes mellitus with other specified complication ([place]) New diagnosis. Pt ed to be mailed and referrd to diabetic educator Start jardiance daily   [place] is reminded of the importance of commitment to daily physical activity for 30 minutes or more, as able and the need to limit carbohydrate intake to 30 to 60 grams per meal to help with blood sugar control.   The need to take medication as prescribed, test blood sugar as directed, and to call between visits if there is a concern that blood sugar is uncontrolled is also discussed.     [place] is reminded of the importance of daily foot exam, annual eye examination, and good blood sugar, blood  pressure and cholesterol control.     Latest Ref Rng & Units 02/13/2022    1:56 PM 09/23/2021    3:46 PM 05/09/2021    7:24 PM 02/26/2021    3:26 PM 10/06/2018    1:53 PM  Diabetic Labs  HbA1c 4.8 - 5.6 % 7.5    5.0    Chol 100 - 199 mg/dL 110  134   197  197   HDL >39 mg/dL 30  37   38  43   Calc LDL 0 - 99 mg/dL 64  80   142  140   Triglycerides 0 - 149 mg/dL 75  90   93  53   Creatinine 0.57 - 1.00 mg/dL 0.23  0.21  <0.30  0.19  0.20       01/06/2022    4:16 PM 09/23/2021    2:17 PM 06/18/2021    1:53 PM 06/03/2021    4:07 PM 05/09/2021    9:30 PM 05/09/2021    6:39 PM 05/07/2021    3:32 PM  BP/Weight  Systolic BP 161 096 045 409 [phone]  Diastolic BP 88 84 80 83 956 114 105  Wt. (Lbs)    152     BMI    23.81 kg/m2          No data to display              Vitamin D deficiency Adequately corrected continue current med  Type II spinal muscular atrophy ([place]) Wheelchair confined and incapable of ambulation, requires assistance in ttransfering  Meeds new bath/ toilet equipment for basic hygiene, current equipment reported old and broken    Follow Up Instructions:    I discussed the assessment and treatment plan with the patient. The patient was provided an opportunity to ask questions and all were answered. The patient agreed with the plan and demonstrated an understanding of the instructions.   The patient was advised to call back or seek an in-person evaluation if the symptoms worsen or if the condition fails to improve as anticipated.  I provided 13 minutes of face-to-face time during this encounter.   Tula Nakayama, MD

## 2022-02-19 NOTE — Patient Instructions (Addendum)
F/u with blood sugar log, in early Septemebr, call if you need me before, flu vaccine at visit  New is once daily jardiance for blood sugar  Nurse please refer pt for diabetic education , soonest available, face to face. and mail basic information about diabetes and blod siuugar testing to her   Nurse please order once daily testing supplies to be sent to her [facility]  Test and record blood sugar every morning before eating , goal is 80 to 120  Eat regularly, cut out obvious sugar and cut down on white starchy foods  Drink only water or no calorie drink   Aim not to eat after 7 pm, and not before 7 am  You WILL get blood sugar very well controlled, I kNOW that!   Thanks for choosing [facility], we consider it a privelige to serve you.

## 2022-02-21 ENCOUNTER — Encounter: Payer: Self-pay | Admitting: Family Medicine

## 2022-02-21 NOTE — Assessment & Plan Note (Signed)
Adequately corrected continue current med

## 2022-02-21 NOTE — Assessment & Plan Note (Signed)
New diagnosis. Pt ed to be mailed and referrd to diabetic educator Start jardiance daily   [place] is reminded of the importance of commitment to daily physical activity for 30 minutes or more, as able and the need to limit carbohydrate intake to 30 to 60 grams per meal to help with blood sugar control.   The need to take medication as prescribed, test blood sugar as directed, and to call between visits if there is a concern that blood sugar is uncontrolled is also discussed.     Janet Acevedo is reminded of the importance of daily foot exam, annual eye examination, and good blood sugar, blood pressure and cholesterol control.     Latest Ref Rng & Units 02/13/2022    1:56 PM 09/23/2021    3:46 PM 05/09/2021    7:24 PM 02/26/2021    3:26 PM 10/06/2018    1:53 PM  Diabetic Labs  HbA1c 4.8 - 5.6 % 7.5    5.0    Chol 100 - 199 mg/dL 110  134   197  197   HDL >39 mg/dL 30  37   38  43   Calc LDL 0 - 99 mg/dL 64  80   142  140   Triglycerides 0 - 149 mg/dL 75  90   93  53   Creatinine 0.57 - 1.00 mg/dL 0.23  0.21  <0.30  0.19  0.20       01/06/2022    4:16 PM 09/23/2021    2:17 PM 06/18/2021    1:53 PM 06/03/2021    4:07 PM 05/09/2021    9:30 PM 05/09/2021    6:39 PM 05/07/2021    3:32 PM  BP/Weight  Systolic BP 482 [phone]  Diastolic BP 88 84 80 83 888 114 105  Wt. (Lbs)    152     BMI    23.81 kg/m2          No data to display

## 2022-02-23 ENCOUNTER — Telehealth: Payer: Self-pay

## 2022-02-23 NOTE — Telephone Encounter (Signed)
Patient called said never received her diabetic supply kit at her pharmacy yet.  Pharmacy: [facility]

## 2022-02-24 ENCOUNTER — Other Ambulatory Visit: Payer: Self-pay

## 2022-02-24 DIAGNOSIS — J4540 Moderate persistent asthma, uncomplicated: Secondary | ICD-10-CM | POA: Diagnosis not present

## 2022-02-24 DIAGNOSIS — J4541 Moderate persistent asthma with (acute) exacerbation: Secondary | ICD-10-CM | POA: Diagnosis not present

## 2022-02-24 MED ORDER — GLUCOSE BLOOD VI STRP: ORAL_STRIP | 5 refills | Status: DC

## 2022-02-24 MED ORDER — BLOOD GLUCOSE METER KIT: PACK | 0 refills | Status: AC

## 2022-02-24 MED ORDER — LANCETS 30G MISC: 5 refills | Status: AC

## 2022-02-25 ENCOUNTER — Telehealth: Payer: Medicare Other | Admitting: Internal Medicine

## 2022-02-25 ENCOUNTER — Encounter: Payer: Self-pay | Admitting: Family Medicine

## 2022-02-25 NOTE — Telephone Encounter (Signed)
Testing kit sent to pharmacy.

## 2022-02-26 ENCOUNTER — Telehealth: Payer: Medicare Other | Admitting: Internal Medicine

## 2022-02-26 ENCOUNTER — Encounter: Payer: Self-pay | Admitting: Family Medicine

## 2022-02-26 NOTE — Telephone Encounter (Signed)
Called patient-voicemail not set up

## 2022-03-02 ENCOUNTER — Telehealth: Payer: Medicare Other | Admitting: Internal Medicine

## 2022-03-02 ENCOUNTER — Encounter: Payer: Self-pay | Admitting: Internal Medicine

## 2022-03-03 ENCOUNTER — Other Ambulatory Visit: Payer: Self-pay | Admitting: Family Medicine

## 2022-03-03 ENCOUNTER — Ambulatory Visit (INDEPENDENT_AMBULATORY_CARE_PROVIDER_SITE_OTHER): Payer: Medicare Other | Admitting: Family Medicine

## 2022-03-03 DIAGNOSIS — R350 Frequency of micturition: Secondary | ICD-10-CM | POA: Diagnosis not present

## 2022-03-03 DIAGNOSIS — E1169 Type 2 diabetes mellitus with other specified complication: Secondary | ICD-10-CM | POA: Diagnosis not present

## 2022-03-03 MED ORDER — SULFAMETHOXAZOLE-TRIMETHOPRIM 800-160 MG PO TABS: 1.0000 | ORAL_TABLET | Freq: Two times a day (BID) | ORAL | 0 refills | Status: DC

## 2022-03-03 NOTE — Progress Notes (Unsigned)
Virtual Visit via Video Note  I connected with [name] on 03/03/22 at  4:40 PM EDT by a video enabled telemedicine application and verified that I am speaking with the correct person using two identifiers.  Location: Patient: home Provider: office   I discussed the limitations of evaluation and management by telemedicine and the availability of in person appointments. The patient expressed understanding and agreed to proceed.  History of Present Illness: 5 day  h/o ;lower abdominal pain and pelvic pressure with increased frequency, also low back pain, no fever or chills Has been increasing  water intake , urine is clear   Observations/Objective: There were no vitals taken for this visit. Good communication with no confusion and intact memory. Alert and oriented x 3 No signs of respiratory distress during speech   Assessment and Plan:  Urinary frequency Presumed UTI based on symptoms, will treat with 3 day course of septra, also push wtaer intake, if symptoms persist or worsen will need s[pecimen for c/s  Type 2 diabetes mellitus with other specified complication ([facility]) Reports FBG of 120 or less, tolerating meds well, still has not diabetic ed class  Follow Up Instructions:    I discussed the assessment and treatment plan with the patient. The patient was provided an opportunity to ask questions and all were answered. The patient agreed with the plan and demonstrated an understanding of the instructions.   The patient was advised to call back or seek an in-person evaluation if the symptoms worsen or if the condition fails to improve as anticipated.  I provided 12 minutes of non-face-to-face time during this encounter.   Tula Nakayama, MD

## 2022-03-03 NOTE — Patient Instructions (Addendum)
F/u as before, call if you need me sooner  3 day antibiotic course septra oisd prescribed  Nurse please refer  for  diabetic education as a new diabetic, needs this  Non fasting hBA1C, chem 7 and EGFR 3 days before next visit  Thanks for choosing [facility], we consider it a privelige to serve you.

## 2022-03-04 ENCOUNTER — Encounter: Payer: Self-pay | Admitting: Family Medicine

## 2022-03-04 DIAGNOSIS — R350 Frequency of micturition: Secondary | ICD-10-CM | POA: Insufficient documentation

## 2022-03-04 NOTE — Assessment & Plan Note (Signed)
Presumed UTI based on symptoms, will treat with 3 day course of septra, also push wtaer intake, if symptoms persist or worsen will need s[pecimen for c/s

## 2022-03-04 NOTE — Assessment & Plan Note (Signed)
Reports FBG of 120 or less, tolerating meds well, still has not diabetic ed class

## 2022-04-09 ENCOUNTER — Telehealth: Payer: Medicare Other | Admitting: Physician Assistant

## 2022-04-09 DIAGNOSIS — Z20828 Contact with and (suspected) exposure to other viral communicable diseases: Secondary | ICD-10-CM

## 2022-04-09 DIAGNOSIS — R6889 Other general symptoms and signs: Secondary | ICD-10-CM

## 2022-04-09 MED ORDER — OSELTAMIVIR PHOSPHATE 75 MG PO CAPS: 75.0000 mg | ORAL_CAPSULE | Freq: Two times a day (BID) | ORAL | 0 refills | Status: DC

## 2022-04-09 NOTE — Progress Notes (Signed)

## 2022-04-09 NOTE — Progress Notes (Signed)
I have spent 5 minutes in review of e-visit questionnaire, review and updating patient chart, medical decision making and response to patient.   Kemon Devincenzi Cody Lachina Salsberry, PA-C    

## 2022-04-09 NOTE — Progress Notes (Signed)
Message sent to patient requesting further input regarding current symptoms. Awaiting patient response.  

## 2022-04-15 ENCOUNTER — Other Ambulatory Visit: Payer: Self-pay | Admitting: Family Medicine

## 2022-04-15 NOTE — Assessment & Plan Note (Signed)
Wheelchair confined and incapable of ambulation, requires assistance in ttransfering Meeds new bath/ toilet equipment for basic hygiene, current equipment reported old and broken

## 2022-04-29 ENCOUNTER — Ambulatory Visit: Payer: Medicare Other | Admitting: Nutrition

## 2022-05-14 ENCOUNTER — Telehealth: Payer: Medicare Other | Admitting: Nutrition

## 2022-05-26 ENCOUNTER — Encounter: Payer: Medicare Other | Admitting: Family Medicine

## 2022-05-29 ENCOUNTER — Encounter: Payer: Medicare Other | Admitting: Family Medicine

## 2022-06-04 ENCOUNTER — Other Ambulatory Visit: Payer: Self-pay | Admitting: Family Medicine

## 2022-06-06 ENCOUNTER — Other Ambulatory Visit: Payer: Self-pay | Admitting: Family Medicine

## 2022-06-17 ENCOUNTER — Encounter: Payer: Medicare Other | Attending: Family Medicine | Admitting: Nutrition

## 2022-06-23 NOTE — Progress Notes (Signed)
Canceled visit due to conflict. Will r/s

## 2022-06-30 ENCOUNTER — Ambulatory Visit: Payer: Medicare Other | Admitting: Physician Assistant

## 2022-07-07 DIAGNOSIS — Z79899 Other long term (current) drug therapy: Secondary | ICD-10-CM | POA: Diagnosis not present

## 2022-07-15 ENCOUNTER — Ambulatory Visit (INDEPENDENT_AMBULATORY_CARE_PROVIDER_SITE_OTHER): Payer: Medicare Other | Admitting: Family Medicine

## 2022-07-15 ENCOUNTER — Encounter: Payer: Self-pay | Admitting: Family Medicine

## 2022-07-15 VITALS — BP 120/80

## 2022-07-15 DIAGNOSIS — Z23 Encounter for immunization: Secondary | ICD-10-CM

## 2022-07-15 DIAGNOSIS — Z13220 Encounter for screening for lipoid disorders: Secondary | ICD-10-CM | POA: Diagnosis not present

## 2022-07-15 DIAGNOSIS — Z0001 Encounter for general adult medical examination with abnormal findings: Secondary | ICD-10-CM | POA: Diagnosis not present

## 2022-07-15 DIAGNOSIS — E1169 Type 2 diabetes mellitus with other specified complication: Secondary | ICD-10-CM

## 2022-07-15 DIAGNOSIS — I10 Essential (primary) hypertension: Secondary | ICD-10-CM

## 2022-07-15 NOTE — Patient Instructions (Signed)
F/U in 4 months, call if you need me sooner  Please do vision screen before checking out   Flu vaccine today  Foot exam deferred per request, I hope feet feel better soon  Pls give cup and request for microalb to be brought bv back to office  Labs today lipid, cmp and EGFR and hBA1C  Please schedule your diabetic eye exam  Thankful that you feel and are doing very well  You need the current covid vaccine and TdAP , you can get those at your pharmacy  Best to you and your family for christmas and 2024!

## 2022-07-16 LAB — CMP14+EGFR
ALT: 19 IU/L (ref 0–32)
AST: 17 IU/L (ref 0–40)
Albumin/Globulin Ratio: 1.4 (ref 1.2–2.2)
Albumin: 4.5 g/dL (ref 4.0–5.0)
Alkaline Phosphatase: 107 IU/L (ref 44–121)
BUN/Creatinine Ratio: 48 — ABNORMAL HIGH (ref 9–23)
BUN: 11 mg/dL (ref 6–20)
Bilirubin Total: 0.4 mg/dL (ref 0.0–1.2)
CO2: 25 mmol/L (ref 20–29)
Calcium: 9.5 mg/dL (ref 8.7–10.2)
Chloride: 101 mmol/L (ref 96–106)
Creatinine, Ser: 0.23 mg/dL — ABNORMAL LOW (ref 0.57–1.00)
Globulin, Total: 3.2 g/dL (ref 1.5–4.5)
Glucose: 96 mg/dL (ref 70–99)
Potassium: 4.7 mmol/L (ref 3.5–5.2)
Sodium: 140 mmol/L (ref 134–144)
Total Protein: 7.7 g/dL (ref 6.0–8.5)
eGFR: 156 mL/min/{1.73_m2} (ref >59)

## 2022-07-16 LAB — HEMOGLOBIN A1C
Est. average glucose Bld gHb Est-mCnc: 134 mg/dL
Hgb A1c MFr Bld: 6.3 % — ABNORMAL HIGH (ref 4.8–5.6)

## 2022-07-16 LAB — LIPID PANEL
Chol/HDL Ratio: 3.8 ratio (ref 0.0–4.4)
Cholesterol, Total: 129 mg/dL (ref 100–199)
HDL: 34 mg/dL — ABNORMAL LOW (ref >39)
LDL Chol Calc (NIH): 80 mg/dL (ref 0–99)
Triglycerides: 74 mg/dL (ref 0–149)
VLDL Cholesterol Cal: 15 mg/dL (ref 5–40)

## 2022-07-17 ENCOUNTER — Encounter: Payer: Self-pay | Admitting: Family Medicine

## 2022-07-17 DIAGNOSIS — Z0001 Encounter for general adult medical examination with abnormal findings: Secondary | ICD-10-CM | POA: Insufficient documentation

## 2022-07-17 HISTORY — DX: Encounter for general adult medical examination with abnormal findings: Z00.01

## 2022-07-17 NOTE — Assessment & Plan Note (Signed)
Annual exam as documented. . Immunization and cancer screening needs are specifically addressed at this visit.  

## 2022-07-17 NOTE — Progress Notes (Signed)
    BREUNNA NORDMANN     MRN: [id number]      DOB: Apr 26, 1991  HPI: Patient is in for annual physical exam. No other health concerns are expressed or addressed at the visit. Recent labs,  are reviewed. Immunization is reviewed , and  updated if needed.   PE: BP 120/80   Pleasant  female, alert and oriented x 3, in no cardio-pulmonary distress. Afebrile. HEENT No facial trauma or asymetry. Sinuses non tender.  Extra occullar muscles intact.. External ears normal, . Neck: decreased ROM, no adenopathy,JVD or thyromegaly.No bruits.  Chest: Clear to ascultation bilaterally.No crackles or wheezes. Non tender to palpation  Cardiovascular system; Heart sounds normal,  S1 and  S2 ,no S3.  No murmur,  Abdomen: Soft, non tender,   Musculoskeletal exam: Markedly decreased  ROM of spine, hips , shoulders and knees. No deformity ,swelling or crepitus noted. Generalized muscle wasting and  atrophy.   Neurologic: Cranial nerves 2 to 12 intact. Quadriplegic Sensation intact  Skin: Intact, no ulceration, erythema , scaling or rash noted. Pigmentation normal throughout  Psych; Normal mood and affect. Judgement and concentration normal   Assessment & Plan:  Annual visit for general adult medical examination with abnormal findings Annual exam as documented. . Immunization and cancer screening needs are specifically addressed at this visit.

## 2022-07-23 ENCOUNTER — Other Ambulatory Visit: Payer: Self-pay | Admitting: Family Medicine

## 2022-08-02 ENCOUNTER — Other Ambulatory Visit: Payer: Self-pay | Admitting: Family Medicine

## 2022-08-05 ENCOUNTER — Encounter: Payer: Self-pay | Admitting: Family Medicine

## 2022-08-06 MED ORDER — CLOTRIMAZOLE-BETAMETHASONE 1-0.05 % EX CREA: 1.0000 | TOPICAL_CREAM | Freq: Two times a day (BID) | CUTANEOUS | 1 refills | Status: DC

## 2022-08-12 ENCOUNTER — Other Ambulatory Visit: Payer: Self-pay | Admitting: Family Medicine

## 2022-09-04 ENCOUNTER — Other Ambulatory Visit: Payer: Self-pay | Admitting: Family Medicine

## 2022-09-08 DIAGNOSIS — J984 Other disorders of lung: Secondary | ICD-10-CM | POA: Diagnosis not present

## 2022-09-10 DIAGNOSIS — J984 Other disorders of lung: Secondary | ICD-10-CM | POA: Diagnosis not present

## 2022-09-14 DIAGNOSIS — J984 Other disorders of lung: Secondary | ICD-10-CM | POA: Diagnosis not present

## 2022-09-14 DIAGNOSIS — E7800 Pure hypercholesterolemia, unspecified: Secondary | ICD-10-CM | POA: Diagnosis not present

## 2022-09-14 DIAGNOSIS — Z79899 Other long term (current) drug therapy: Secondary | ICD-10-CM | POA: Diagnosis not present

## 2022-09-14 DIAGNOSIS — Z7984 Long term (current) use of oral hypoglycemic drugs: Secondary | ICD-10-CM | POA: Diagnosis not present

## 2022-09-14 DIAGNOSIS — I10 Essential (primary) hypertension: Secondary | ICD-10-CM | POA: Diagnosis not present

## 2022-09-14 DIAGNOSIS — E119 Type 2 diabetes mellitus without complications: Secondary | ICD-10-CM | POA: Diagnosis not present

## 2022-09-18 DIAGNOSIS — E119 Type 2 diabetes mellitus without complications: Secondary | ICD-10-CM | POA: Diagnosis not present

## 2022-09-18 LAB — HM DIABETES EYE EXAM

## 2022-09-27 ENCOUNTER — Other Ambulatory Visit: Payer: Self-pay | Admitting: Family Medicine

## 2022-10-13 ENCOUNTER — Other Ambulatory Visit: Payer: Self-pay | Admitting: Family Medicine

## 2022-10-14 ENCOUNTER — Other Ambulatory Visit: Payer: Self-pay | Admitting: Family Medicine

## 2022-11-16 ENCOUNTER — Encounter: Payer: Self-pay | Admitting: Family Medicine

## 2022-11-17 ENCOUNTER — Telehealth (INDEPENDENT_AMBULATORY_CARE_PROVIDER_SITE_OTHER): Payer: 59 | Admitting: Family Medicine

## 2022-11-17 ENCOUNTER — Encounter: Payer: Self-pay | Admitting: Family Medicine

## 2022-11-17 DIAGNOSIS — E1159 Type 2 diabetes mellitus with other circulatory complications: Secondary | ICD-10-CM

## 2022-11-17 DIAGNOSIS — I152 Hypertension secondary to endocrine disorders: Secondary | ICD-10-CM

## 2022-11-18 NOTE — Telephone Encounter (Signed)
Already scheduled

## 2022-11-22 NOTE — Progress Notes (Signed)
Visit cancelled, unable to access pt via video

## 2022-11-25 ENCOUNTER — Other Ambulatory Visit: Payer: Self-pay | Admitting: Family Medicine

## 2022-11-25 ENCOUNTER — Telehealth: Payer: 59 | Admitting: Family Medicine

## 2022-11-25 ENCOUNTER — Encounter: Payer: Self-pay | Admitting: Internal Medicine

## 2022-11-25 DIAGNOSIS — R110 Nausea: Secondary | ICD-10-CM

## 2022-11-25 DIAGNOSIS — Z20828 Contact with and (suspected) exposure to other viral communicable diseases: Secondary | ICD-10-CM | POA: Diagnosis not present

## 2022-11-25 MED ORDER — ONDANSETRON HCL 4 MG PO TABS
4.0000 mg | ORAL_TABLET | Freq: Three times a day (TID) | ORAL | 0 refills | Status: DC | PRN
Start: 2022-11-25 — End: 2023-02-16

## 2022-11-25 MED ORDER — OSELTAMIVIR PHOSPHATE 75 MG PO CAPS: 75.0000 mg | ORAL_CAPSULE | Freq: Two times a day (BID) | ORAL | 0 refills | Status: AC

## 2022-11-25 NOTE — Progress Notes (Signed)
E visit for Flu like symptoms   We are sorry that you are not feeling well.  Here is how we plan to help! Based on what you have shared with me it looks like you may have possible exposure to a virus that causes influenza.  Influenza or "the flu" is   an infection caused by a respiratory virus. The flu virus is highly contagious and persons who did not receive their yearly flu vaccination may "catch" the flu from close contact.  We have anti-viral medications to treat the viruses that cause this infection. They are not a "cure" and only shorten the course of the infection. These prescriptions are most effective when they are given within the first 2 days of "flu" symptoms. Antiviral medication are indicated if you have a high risk of complications from the flu. You should  also consider an antiviral medication if you are in close contact with someone who is at risk. These medications can help patients avoid complications from the flu  but have side effects that you should know. Possible side effects from Tamiflu or oseltamivir include nausea, vomiting, diarrhea, dizziness, headaches, eye redness, sleep problems or other respiratory symptoms. You should not take Tamiflu if you have an allergy to oseltamivir or any to the ingredients in Tamiflu.  Based upon your symptoms and potential risk factors I have prescribed Oseltamivir (Tamiflu).  It has been sent to your designated pharmacy.  You will take one 75 mg capsule orally twice a day for the next 5 days. and I recommend that you follow the flu symptoms recommendation that I have listed below.  ANYONE WHO HAS FLU SYMPTOMS SHOULD: Stay home. The flu is highly contagious and going out or to work exposes others! Be sure to drink plenty of fluids. Water is fine as well as fruit juices, sodas and electrolyte beverages. You may want to stay away from caffeine or alcohol. If you are nauseated, try taking small sips of liquids. How do you know if you are getting  enough fluid? Your urine should be a pale yellow or almost colorless. Get rest. Taking a steamy shower or using a humidifier may help nasal congestion and ease sore throat pain. Using a saline nasal spray works much the same way. Cough drops, hard candies and sore throat lozenges may ease your cough. Line up a caregiver. Have someone check on you regularly.   GET HELP RIGHT AWAY IF: You cannot keep down liquids or your medications. You become short of breath Your fell like you are going to pass out or loose consciousness. Your symptoms persist after you have completed your treatment plan MAKE SURE YOU  Understand these instructions. Will watch your condition. Will get help right away if you are not doing well or get worse.  Your e-visit answers were reviewed by a board certified advanced clinical practitioner to complete your personal care plan.  Depending on the condition, your plan could have included both over the counter or prescription medications.  If there is a problem please reply  once you have received a response from your provider.  Your safety is important to us.  If you have drug allergies check your prescription carefully.    You can use MyChart to ask questions about today's visit, request a non-urgent call back, or ask for a work or school excuse for 24 hours related to this e-Visit. If it has been greater than 24 hours you will need to follow up with your provider, or enter   a new e-Visit to address those concerns.  You will get an e-mail in the next two days asking about your experience.  I hope that your e-visit has been valuable and will speed your recovery. Thank you for using e-visits.  I provided 5 minutes of non face-to-face time during this encounter for chart review, medication and order placement, as well as and documentation.   

## 2022-11-25 NOTE — Addendum Note (Signed)
Addended by: Freddy Finner on: 11/25/2022 09:19 AM   Modules accepted: Orders

## 2022-11-25 NOTE — Telephone Encounter (Signed)
LVM to call back to office to discuss symptoms and to set appt. Also sent message back through My Chart

## 2022-12-01 ENCOUNTER — Telehealth: Payer: 59 | Admitting: Nurse Practitioner

## 2022-12-01 ENCOUNTER — Telehealth: Payer: Self-pay | Admitting: Internal Medicine

## 2022-12-01 DIAGNOSIS — J4541 Moderate persistent asthma with (acute) exacerbation: Secondary | ICD-10-CM

## 2022-12-01 DIAGNOSIS — J069 Acute upper respiratory infection, unspecified: Secondary | ICD-10-CM

## 2022-12-01 NOTE — Telephone Encounter (Signed)
Patient returned RN's call. 

## 2022-12-01 NOTE — Telephone Encounter (Signed)
Called patient. She states issues with "twitching of the lip" that she was seen for a year ago.  Offered appt for next week but she declines due to "sick and will take me a week to get better".  She asked for a few weeks out. Dr Wyline Mood next available is June and patient scheduled June 5th

## 2022-12-02 MED ORDER — PREDNISONE 10 MG (21) PO TBPK
ORAL_TABLET | ORAL | 0 refills | Status: DC
Start: 2022-12-02 — End: 2022-12-15

## 2022-12-02 MED ORDER — ALBUTEROL SULFATE (2.5 MG/3ML) 0.083% IN NEBU
2.5000 mg | INHALATION_SOLUTION | Freq: Four times a day (QID) | RESPIRATORY_TRACT | 1 refills | Status: DC | PRN
Start: 2022-12-02 — End: 2023-05-20

## 2022-12-02 MED ORDER — BENZONATATE 100 MG PO CAPS
100.0000 mg | ORAL_CAPSULE | Freq: Three times a day (TID) | ORAL | 0 refills | Status: DC | PRN
Start: 2022-12-02 — End: 2023-03-10

## 2022-12-02 NOTE — Progress Notes (Signed)
E-Visit for Cough  We are sorry that you are not feeling well.  Here is how we plan to help!  Based on your presentation I believe you most likely have A cough due to a virus.  This is called viral bronchitis and is best treated by rest, plenty of fluids and control of the cough.  You may use Ibuprofen or Tylenol as directed to help your symptoms.     In addition you may use A prescription cough medication called Tessalon Perles 100mg . You may take 1-2 capsules every 8 hours as needed for your cough.  Prednisone 10 mg daily for 6 days (see taper instructions below) We will also refill your Albuterol Nebulizer solution to use as needed Meds ordered this encounter  Medications   predniSONE (STERAPRED UNI-PAK 21 TAB) 10 MG (21) TBPK tablet    Sig: Take 6 tablets on day one, 5 on day two, 4 on day three, 3 on day four, 2 on day five, and 1 on day six. Take with food.    Dispense:  21 tablet    Refill:  0   benzonatate (TESSALON) 100 MG capsule    Sig: Take 1 capsule (100 mg total) by mouth 3 (three) times daily as needed.    Dispense:  30 capsule    Refill:  0   albuterol (PROVENTIL) (2.5 MG/3ML) 0.083% nebulizer solution    Sig: Take 3 mLs (2.5 mg total) by nebulization every 6 (six) hours as needed for wheezing or shortness of breath.    Dispense:  150 mL    Refill:  1     From your responses in the eVisit questionnaire you describe inflammation in the upper respiratory tract which is causing a significant cough.  This is commonly called Bronchitis and has four common causes:   Allergies Viral Infections Acid Reflux Bacterial Infection Allergies, viruses and acid reflux are treated by controlling symptoms or eliminating the cause. An example might be a cough caused by taking certain blood pressure medications. You stop the cough by changing the medication. Another example might be a cough caused by acid reflux. Controlling the reflux helps control the cough.  USE OF BRONCHODILATOR  ("RESCUE") INHALERS: There is a risk from using your bronchodilator too frequently.  The risk is that over-reliance on a medication which only relaxes the muscles surrounding the breathing tubes can reduce the effectiveness of medications prescribed to reduce swelling and congestion of the tubes themselves.  Although you feel brief relief from the bronchodilator inhaler, your asthma may actually be worsening with the tubes becoming more swollen and filled with mucus.  This can delay other crucial treatments, such as oral steroid medications. If you need to use a bronchodilator inhaler daily, several times per day, you should discuss this with your provider.  There are probably better treatments that could be used to keep your asthma under control.     HOME CARE Only take medications as instructed by your medical team. Complete the entire course of an antibiotic. Drink plenty of fluids and get plenty of rest. Avoid close contacts especially the very young and the elderly Cover your mouth if you cough or cough into your sleeve. Always remember to wash your hands A steam or ultrasonic humidifier can help congestion.   GET HELP RIGHT AWAY IF: You develop worsening fever. You become short of breath You cough up blood. Your symptoms persist after you have completed your treatment plan MAKE SURE YOU  Understand these instructions. Will  watch your condition. Will get help right away if you are not doing well or get worse.    Thank you for choosing an e-visit.  Your e-visit answers were reviewed by a board certified advanced clinical practitioner to complete your personal care plan. Depending upon the condition, your plan could have included both over the counter or prescription medications.  Please review your pharmacy choice. Make sure the pharmacy is open so you can pick up prescription now. If there is a problem, you may contact your provider through [employer] and have the prescription  routed to another pharmacy.  Your safety is important to [place]. If you have drug allergies check your prescription carefully.   For the next 24 hours you can use MyChart to ask questions about today's visit, request a non-urgent call back, or ask for a work or school excuse. You will get an email in the next two days asking about your experience. I hope that your e-visit has been valuable and will speed your recovery.   I spent approximately 5 minutes reviewing the patient's history, current symptoms and coordinating their care today.

## 2022-12-04 ENCOUNTER — Encounter: Payer: Self-pay | Admitting: Family Medicine

## 2022-12-04 ENCOUNTER — Other Ambulatory Visit: Payer: Self-pay

## 2022-12-04 DIAGNOSIS — J4540 Moderate persistent asthma, uncomplicated: Secondary | ICD-10-CM

## 2022-12-04 MED ORDER — NEBULIZER/TUBING/MOUTHPIECE KIT
PACK | 0 refills | Status: DC
Start: 2022-12-04 — End: 2022-12-09

## 2022-12-09 ENCOUNTER — Other Ambulatory Visit: Payer: Self-pay

## 2022-12-09 DIAGNOSIS — J4540 Moderate persistent asthma, uncomplicated: Secondary | ICD-10-CM

## 2022-12-09 MED ORDER — UNABLE TO FIND: 0 refills | Status: AC

## 2022-12-09 MED ORDER — NEBULIZER/TUBING/MOUTHPIECE KIT
PACK | 0 refills | Status: AC
Start: 2022-12-09 — End: ?

## 2022-12-15 ENCOUNTER — Telehealth: Payer: 59 | Admitting: Physician Assistant

## 2022-12-15 DIAGNOSIS — J4541 Moderate persistent asthma with (acute) exacerbation: Secondary | ICD-10-CM | POA: Diagnosis not present

## 2022-12-15 MED ORDER — AZITHROMYCIN 250 MG PO TABS
ORAL_TABLET | ORAL | 0 refills | Status: AC
Start: 2022-12-15 — End: 2022-12-20

## 2022-12-15 MED ORDER — PREDNISONE 50 MG PO TABS
50.0000 mg | ORAL_TABLET | Freq: Every day | ORAL | 0 refills | Status: DC
Start: 2022-12-15 — End: 2023-02-16

## 2022-12-15 MED ORDER — PROMETHAZINE-DM 6.25-15 MG/5ML PO SYRP
5.0000 mL | ORAL_SOLUTION | Freq: Four times a day (QID) | ORAL | 0 refills | Status: DC | PRN
Start: 2022-12-15 — End: 2023-02-16

## 2022-12-15 NOTE — Progress Notes (Signed)
E-Visit for Asthma  Based on what you have shared with me, it looks like you may have a flare up of your asthma.  Asthma is a chronic (ongoing) lung disease which results in airway obstruction, inflammation and hyper-responsiveness.   Asthma symptoms vary from person to person, with common symptoms including nighttime awakening and decreased ability to participate in normal activities as a result of shortness of breath. It is often triggered by changes in weather, changes in the season, changes in air temperature, or inside (home, school, daycare or work) allergens such as animal dander, mold, mildew, woodstoves or cockroaches.   It can also be triggered by hormonal changes, extreme emotion, physical exertion or an upper respiratory tract illness.     It is important to identify the trigger, and then eliminate or avoid the trigger if possible.   If you have been prescribed medications to be taken on a regular basis, it is important to follow the asthma action plan and to follow guidelines to adjust medication in response to increasing symptoms of decreased peak expiratory flow rate  Treatment: I have prescribed: Prednisone 50mg  Take one tablet daily for 5 days, then go back to previously prescribed Prednisone 10mg   I have also prescribed Azithromycin 250mg  Take 2 tablets on Day 1, then 1 tablet daily until completed.   I will also prescribe Promethazine DM cough syrup Take 5mL every 6 hours as needed for cough.  HOME CARE Only take medications as instructed by your medical team. Consider wearing a mask or scarf to improve breathing air temperature have been shown to decrease irritation and decrease exacerbations Get rest. Taking a steamy shower or using a humidifier may help nasal congestion sand ease sore throat pain. You can place a towel over your head and breathe in the steam from  hot water coming from a faucet. Using a saline nasal spray works much the same way.  Cough drops, hare candies and sore throat lozenges may ease your cough.  Avoid close contacts especially the very you and the elderly Cover your mouth if you cough or sneeze Always remember to wash your hands.    GET HELP RIGHT AWAY IF: You develop worsening symptoms; breathlessness at rest, drowsy, confused or agitated, unable to speak in full sentences You have coughing fits You develop a severe headache or visual changes You develop shortness of breath, difficulty breathing or start having chest pain Your symptoms persist after you have completed your treatment plan If your symptoms do not improve within 10 days  MAKE SURE YOU Understand these instructions. Will watch your condition. Will get help right away if you are not doing well or get worse.   Your e-visit answers were reviewed by a board certified advanced clinical practitioner to complete your personal care plan, Depending upon the condition, your plan could have included both over the counter or prescription medications.   Please review your pharmacy choice. Your safety is important to [place]. If you have drug allergies check your prescription carefully.  You can use MyChart to ask questions about today's visit, request a non-urgent  call back, or ask for a work or school excuse for 24 hours related to this e-Visit. If it has been greater than 24 hours you will need to follow up with your provider, or enter a new e-Visit to address those concerns.   You will get an e-mail in the next two days asking about your experience. I hope that your e-visit has been valuable and will  speed your recovery. Thank you for using e-visits.  I have spent 5 minutes in review of e-visit questionnaire, review and updating patient chart, medical decision making and response to patient.   Margaretann Loveless, PA-C

## 2022-12-19 ENCOUNTER — Other Ambulatory Visit: Payer: Self-pay | Admitting: Family Medicine

## 2022-12-30 ENCOUNTER — Ambulatory Visit: Payer: 59 | Admitting: Internal Medicine

## 2023-01-15 DIAGNOSIS — J4540 Moderate persistent asthma, uncomplicated: Secondary | ICD-10-CM | POA: Diagnosis not present

## 2023-01-15 DIAGNOSIS — J4541 Moderate persistent asthma with (acute) exacerbation: Secondary | ICD-10-CM | POA: Diagnosis not present

## 2023-01-20 ENCOUNTER — Encounter: Payer: Self-pay | Admitting: Internal Medicine

## 2023-01-20 ENCOUNTER — Encounter: Payer: Self-pay | Admitting: Family Medicine

## 2023-01-21 NOTE — Telephone Encounter (Signed)
Patient scheduled for an in office visit, does patient need need lab order to come in before her appointment or wait til her visit. Call patient back either way.

## 2023-01-22 ENCOUNTER — Other Ambulatory Visit: Payer: Self-pay

## 2023-01-22 DIAGNOSIS — E1169 Type 2 diabetes mellitus with other specified complication: Secondary | ICD-10-CM

## 2023-01-22 DIAGNOSIS — E559 Vitamin D deficiency, unspecified: Secondary | ICD-10-CM

## 2023-01-22 DIAGNOSIS — Z13220 Encounter for screening for lipoid disorders: Secondary | ICD-10-CM

## 2023-01-22 DIAGNOSIS — E1159 Type 2 diabetes mellitus with other circulatory complications: Secondary | ICD-10-CM

## 2023-01-22 DIAGNOSIS — I10 Essential (primary) hypertension: Secondary | ICD-10-CM

## 2023-02-02 ENCOUNTER — Ambulatory Visit: Payer: 59 | Admitting: Internal Medicine

## 2023-02-11 ENCOUNTER — Encounter: Payer: Self-pay | Admitting: Family Medicine

## 2023-02-12 ENCOUNTER — Other Ambulatory Visit: Payer: Self-pay | Admitting: Family Medicine

## 2023-02-15 ENCOUNTER — Telehealth: Payer: Self-pay | Admitting: Family Medicine

## 2023-02-15 NOTE — Telephone Encounter (Signed)
Pls refer for twice weekly gh/gh to eval and manage sacral decubitus ulcer for 6 weeks/ until fully healed, send attached pic she sent in pt message last week please also

## 2023-02-16 ENCOUNTER — Encounter: Payer: Self-pay | Admitting: Family Medicine

## 2023-02-16 ENCOUNTER — Other Ambulatory Visit: Payer: Self-pay

## 2023-02-16 ENCOUNTER — Telehealth (INDEPENDENT_AMBULATORY_CARE_PROVIDER_SITE_OTHER): Payer: 59 | Admitting: Family Medicine

## 2023-02-16 DIAGNOSIS — L8990 Pressure ulcer of unspecified site, unspecified stage: Secondary | ICD-10-CM | POA: Insufficient documentation

## 2023-02-16 DIAGNOSIS — L89159 Pressure ulcer of sacral region, unspecified stage: Secondary | ICD-10-CM

## 2023-02-16 DIAGNOSIS — E785 Hyperlipidemia, unspecified: Secondary | ICD-10-CM

## 2023-02-16 DIAGNOSIS — E1169 Type 2 diabetes mellitus with other specified complication: Secondary | ICD-10-CM

## 2023-02-16 DIAGNOSIS — L89301 Pressure ulcer of unspecified buttock, stage 1: Secondary | ICD-10-CM | POA: Diagnosis not present

## 2023-02-16 DIAGNOSIS — I10 Essential (primary) hypertension: Secondary | ICD-10-CM | POA: Diagnosis not present

## 2023-02-16 MED ORDER — BUSPIRONE HCL 7.5 MG PO TABS: 7.5000 mg | ORAL_TABLET | Freq: Two times a day (BID) | ORAL | 5 refills | Status: DC

## 2023-02-16 NOTE — Patient Instructions (Addendum)
F/u as before, call if you need me sooner  Wellness is past due , please schedule  Please send in microalb as soon as possible  If able please get fasting labs already ordered before your appt  Pls send eye exam report that you have had done this year  Continue saline cleanse and dry gauze with neosporin sparingly to ulcer  H/H will come out to assess, your ulcer,  I have referred   You ned TdAP, please get at your pharmacy  I am trying to get help with therapist for you  Thanks for choosing  Primary [facility], we consider it a privelige to serve you.

## 2023-02-16 NOTE — Assessment & Plan Note (Signed)
1 week history, responding to current management initiated by pt, refer for h/h to assess and manage asap, to reduce complications

## 2023-02-16 NOTE — Assessment & Plan Note (Signed)
Janet Acevedo is reminded of the importance of commitment to daily physical activity for 30 minutes or more, as able and the need to limit carbohydrate intake to 30 to 60 grams per meal to help with blood sugar control.   The need to take medication as prescribed, test blood sugar as directed, and to call between visits if there is a concern that blood sugar is uncontrolled is also discussed.     Janet Acevedo is reminded of the importance of daily foot exam, annual eye examination, and good blood sugar, blood pressure and cholesterol control.     Latest Ref Rng & Units 07/15/2022    3:02 PM 02/13/2022    1:56 PM 09/23/2021    3:46 PM 05/09/2021    7:24 PM 02/26/2021    3:26 PM  Diabetic Labs  HbA1c 4.8 - 5.6 % 6.3  7.5    5.0   Chol 100 - 199 mg/dL 409  811  914   782   HDL >39 mg/dL 34  30  37   38   Calc LDL 0 - 99 mg/dL 80  64  80   956   Triglycerides 0 - 149 mg/dL 74  75  90   93   Creatinine 0.57 - 1.00 mg/dL 2.13  0.86  5.78  <4.69  0.19       07/15/2022    2:39 PM 07/15/2022    2:09 PM 01/06/2022    4:16 PM 09/23/2021    2:17 PM 06/18/2021    1:53 PM 06/03/2021    4:07 PM 05/09/2021    9:30 PM  BP/Weight  Systolic BP 120 119 128 125 108 134 139  Diastolic BP 80 85 88 84 80 83 629  Wt. (Lbs)      152   BMI      23.81 kg/m2        No data to display            Updated lab needed at/ before next visit.

## 2023-02-16 NOTE — Telephone Encounter (Signed)
Referral sent 

## 2023-02-16 NOTE — Assessment & Plan Note (Signed)
Hyperlipidemia:Low fat diet discussed and encouraged.   Lipid Panel  Lab Results  Component Value Date   CHOL 129 07/15/2022   HDL 34 (L) 07/15/2022   LDLCALC 80 07/15/2022   TRIG 74 07/15/2022   CHOLHDL 3.8 07/15/2022     Updated lab needed at/ before next visit.

## 2023-02-16 NOTE — Progress Notes (Signed)
Virtual Visit via Video Note  I connected with Janet Acevedo on 02/16/23 at 11:40 AM EDT by a video enabled telemedicine application and verified that I am speaking with the correct person using two identifiers.  Location: Patient: home Provider: office   I discussed the limitations of evaluation and management by telemedicine and the availability of in person appointments. The patient expressed understanding and agreed to proceed.  History of Present Illness: 1 week h/o skin breakdown between upper buttocks , first ever episode, upper  fold, no fever or chills, scant bloody drainage from the area Has been doing saline clenser , neosporin and loose gauze to area, thinks may be improving   Observations/Objective: There were no vitals taken for this visit. Good communication with no confusion and intact memory. Alert and oriented x 3 No signs of respiratory distress during speech Photo of affected area scanned in, max diameter approx 3 cm  Assessment and Plan:  Pressure injury of buttock, stage 1, unspecified laterality Assessment & Plan: 1 week history, responding to current management initiated by pt, refer for h/h to assess and manage asap, to reduce complications   Type 2 diabetes mellitus with other specified complication, without long-term current use of insulin (HCC) Overview: Dx in 01/2022  Assessment & Plan:   [place] is reminded of the importance of commitment to daily physical activity for 30 minutes or more, as able and the need to limit carbohydrate intake to 30 to 60 grams per meal to help with blood sugar control.   The need to take medication as prescribed, test blood sugar as directed, and to call between visits if there is a concern that blood sugar is uncontrolled is also discussed.     [place] is reminded of the importance of daily foot exam, annual eye examination, and good blood sugar, blood pressure and cholesterol control.     Latest Ref Rng & Units  07/15/2022    3:02 PM 02/13/2022    1:56 PM 09/23/2021    3:46 PM 05/09/2021    7:24 PM 02/26/2021    3:26 PM  Diabetic Labs  HbA1c 4.8 - 5.6 % 6.3  7.5    5.0   Chol 100 - 199 mg/dL 098  119  147   829   HDL >39 mg/dL 34  30  37   38   Calc LDL 0 - 99 mg/dL 80  64  80   562   Triglycerides 0 - 149 mg/dL 74  75  90   93   Creatinine 0.57 - 1.00 mg/dL 1.30  8.65  7.84  <6.96  0.19       07/15/2022    2:39 PM 07/15/2022    2:09 PM 01/06/2022    4:16 PM 09/23/2021    2:17 PM 06/18/2021    1:53 PM 06/03/2021    4:07 PM 05/09/2021    9:30 PM  BP/Weight  Systolic BP 120 119 128 125 108 134 139  Diastolic BP 80 85 88 84 80 83 295  Wt. (Lbs)      152   BMI      23.81 kg/m2        No data to display            Updated lab needed at/ before next visit.    Hypertension, unspecified type Overview: Dx in 04/2021  Assessment & Plan: Controlled, no change in medication    Dyslipidemia Assessment & Plan: Hyperlipidemia:Low fat diet discussed and encouraged.  Lipid Panel  Lab Results  Component Value Date   CHOL 129 07/15/2022   HDL 34 (L) 07/15/2022   LDLCALC 80 07/15/2022   TRIG 74 07/15/2022   CHOLHDL 3.8 07/15/2022     Updated lab needed at/ before next visit.    Other orders -     busPIRone HCl; Take 1 tablet (7.5 mg total) by mouth 2 (two) times daily.  Dispense: 60 tablet; Refill: 5    Follow Up Instructions:    I discussed the assessment and treatment plan with the patient. The patient was provided an opportunity to ask questions and all were answered. The patient agreed with the plan and demonstrated an understanding of the instructions.   The patient was advised to call back or seek an in-person evaluation if the symptoms worsen or if the condition fails to improve as anticipated.  I provided 13 minutes face-to-face time during this encounter.   Syliva Overman, MD

## 2023-02-16 NOTE — Assessment & Plan Note (Signed)
Controlled, no change in medication  

## 2023-02-17 ENCOUNTER — Other Ambulatory Visit: Payer: Self-pay

## 2023-02-17 DIAGNOSIS — E1169 Type 2 diabetes mellitus with other specified complication: Secondary | ICD-10-CM

## 2023-02-21 ENCOUNTER — Other Ambulatory Visit: Payer: Self-pay | Admitting: Family Medicine

## 2023-02-26 ENCOUNTER — Ambulatory Visit: Payer: 59 | Admitting: Family Medicine

## 2023-02-26 DIAGNOSIS — E1169 Type 2 diabetes mellitus with other specified complication: Secondary | ICD-10-CM | POA: Diagnosis not present

## 2023-02-26 DIAGNOSIS — E559 Vitamin D deficiency, unspecified: Secondary | ICD-10-CM | POA: Diagnosis not present

## 2023-02-26 DIAGNOSIS — I152 Hypertension secondary to endocrine disorders: Secondary | ICD-10-CM | POA: Diagnosis not present

## 2023-02-26 DIAGNOSIS — E1159 Type 2 diabetes mellitus with other circulatory complications: Secondary | ICD-10-CM | POA: Diagnosis not present

## 2023-02-26 DIAGNOSIS — Z13220 Encounter for screening for lipoid disorders: Secondary | ICD-10-CM | POA: Diagnosis not present

## 2023-03-02 ENCOUNTER — Encounter: Payer: Self-pay | Admitting: Family Medicine

## 2023-03-05 ENCOUNTER — Other Ambulatory Visit: Payer: Self-pay

## 2023-03-05 DIAGNOSIS — Z113 Encounter for screening for infections with a predominantly sexual mode of transmission: Secondary | ICD-10-CM

## 2023-03-05 NOTE — Telephone Encounter (Signed)
Labs added.

## 2023-03-10 ENCOUNTER — Encounter: Payer: Self-pay | Admitting: Family Medicine

## 2023-03-10 ENCOUNTER — Ambulatory Visit (INDEPENDENT_AMBULATORY_CARE_PROVIDER_SITE_OTHER): Payer: 59 | Admitting: Family Medicine

## 2023-03-10 VITALS — BP 111/84 | HR 78

## 2023-03-10 DIAGNOSIS — J4540 Moderate persistent asthma, uncomplicated: Secondary | ICD-10-CM

## 2023-03-10 DIAGNOSIS — I10 Essential (primary) hypertension: Secondary | ICD-10-CM | POA: Diagnosis not present

## 2023-03-10 DIAGNOSIS — R7989 Other specified abnormal findings of blood chemistry: Secondary | ICD-10-CM | POA: Diagnosis not present

## 2023-03-10 DIAGNOSIS — Z113 Encounter for screening for infections with a predominantly sexual mode of transmission: Secondary | ICD-10-CM | POA: Diagnosis not present

## 2023-03-10 DIAGNOSIS — F321 Major depressive disorder, single episode, moderate: Secondary | ICD-10-CM

## 2023-03-10 DIAGNOSIS — E1169 Type 2 diabetes mellitus with other specified complication: Secondary | ICD-10-CM

## 2023-03-10 DIAGNOSIS — D72829 Elevated white blood cell count, unspecified: Secondary | ICD-10-CM

## 2023-03-10 NOTE — Patient Instructions (Addendum)
You are referred to Dr Wolfgang Phoenix  ( kidney) and also to Hematology re elevated WBC  Pls scan your recent eye exam from 08/2022   Urine ACR , and STD screening tests also add HSV 2 to to  lab today    TdaP  due and at pharmacy  Good foot exam , you may have Podiatry cut nails, you will be referred   Thanks for choosing [facility], we consider it a privelige to serve you.

## 2023-03-11 ENCOUNTER — Other Ambulatory Visit: Payer: Self-pay | Admitting: Family Medicine

## 2023-03-12 LAB — HSV-2 AB, IGG: HSV 2 IgG, Type Spec: <0.91 {index} (ref 0.00–0.90)

## 2023-03-15 ENCOUNTER — Encounter: Payer: Self-pay | Admitting: Family Medicine

## 2023-03-15 ENCOUNTER — Other Ambulatory Visit: Payer: Self-pay | Admitting: Family Medicine

## 2023-03-15 DIAGNOSIS — R7989 Other specified abnormal findings of blood chemistry: Secondary | ICD-10-CM | POA: Insufficient documentation

## 2023-03-15 DIAGNOSIS — D72829 Elevated white blood cell count, unspecified: Secondary | ICD-10-CM | POA: Insufficient documentation

## 2023-03-15 NOTE — Assessment & Plan Note (Signed)
Intermittently elevated  white cell count for past 5 year, refer hematology

## 2023-03-15 NOTE — Assessment & Plan Note (Signed)
Controlled, no change in medication  

## 2023-03-15 NOTE — Assessment & Plan Note (Addendum)
Janet Acevedo is reminded of the importance of commitment to daily physical activity for 30 minutes or more, as able and the need to limit carbohydrate intake to 30 to 60 grams per meal to help with blood sugar control.   The need to take medication as prescribed, test blood sugar as directed, and to call between visits if there is a concern that blood sugar is uncontrolled is also discussed.  Controlled , continue current medication    Janet Acevedo is reminded of the importance of daily foot exam, annual eye examination, and good blood sugar, blood pressure and cholesterol control.     Latest Ref Rng & Units 02/26/2023    1:49 PM 07/15/2022    3:02 PM 02/13/2022    1:56 PM 09/23/2021    3:46 PM 05/09/2021    7:24 PM  Diabetic Labs  HbA1c 4.8 - 5.6 % 6.0  6.3  7.5     Chol 100 - 199 mg/dL 981  191  478  295    HDL >39 mg/dL 34  34  30  37    Calc LDL 0 - 99 mg/dL 84  80  64  80    Triglycerides 0 - 149 mg/dL 83  74  75  90    Creatinine 0.57 - 1.00 mg/dL 6.21  3.08  6.57  8.46  <0.30       03/10/2023    3:57 PM 07/15/2022    2:39 PM 07/15/2022    2:09 PM 01/06/2022    4:16 PM 09/23/2021    2:17 PM 06/18/2021    1:53 PM 06/03/2021    4:07 PM  BP/Weight  Systolic BP 111 120 119 128 125 108 134  Diastolic BP 84 80 85 88 84 80 83  Wt. (Lbs)       152  BMI       23.81 kg/m2       No data to display            Updated lab needed at/ before next visit.

## 2023-03-15 NOTE — Progress Notes (Signed)
Janet Acevedo     MRN: [id number]      DOB: 07/27/1991  Chief Complaint  Patient presents with   Follow-up    Follow up requesting labs    HPI   [place] is here for follow up and re-evaluation of chronic medical conditions, medication management and review of any available recent lab and radiology data.  Preventive health is updated, specifically  Cancer screening and Immunization.   Questions or concerns regarding consultations or procedures which the PT has had in the interim are  addressed.Has had her pap. In new relationship and wants full STD testing The PT denies any adverse reactions to current medications since the last visit.  ROS Denies recent fever or chills. Denies sinus pressure, nasal congestion, ear pain or sore throat. Denies chest congestion, productive cough or wheezing. Denies chest pains, palpitations and leg swelling Denies abdominal pain, nausea, vomiting,diarrhea or constipation.   Denies dysuria, frequency, hesitancy or incontinence.  Denies ucontrolled depression, anxiety or insomnia.Wants therapy still Denies skin break down or rash.Skin wasintact when h/ called to assess last week   PE  BP 111/84 (BP Location: Left Arm, Patient Position: Sitting, Cuff Size: Large)   Pulse 78   SpO2 94%   Patient alert and oriented and in no cardiopulmonary distress.  HEENT: No facial asymmetry, EOMI,     Neck decreased ROM .  Chest: Clear to auscultation bilaterally.  CVS: S1, S2 no S3.Regular rate.  ABD: Soft non tender.   Ext: No edema  MS: markedly decreased  ROM spine, shoulders, hips and knees.  Skin: Intact, no ulcerations or rash noted.  Psych: Good eye contact, normal affect. Memory intact not anxious or depressed appearing.  CNS: CN 2-12 intact, parpalegic Assessment & Plan  Leukocytosis Intermittently elevated  white cell count for past 5 year, refer hematology  Hypertension Controlled, no change in medication   Type 2 diabetes mellitus  with other specified complication (HCC)   [place] is reminded of the importance of commitment to daily physical activity for 30 minutes or more, as able and the need to limit carbohydrate intake to 30 to 60 grams per meal to help with blood sugar control.   The need to take medication as prescribed, test blood sugar as directed, and to call between visits if there is a concern that blood sugar is uncontrolled is also discussed.  Controlled , continue current medication    [place] is reminded of the importance of daily foot exam, annual eye examination, and good blood sugar, blood pressure and cholesterol control.     Latest Ref Rng & Units 02/26/2023    1:49 PM 07/15/2022    3:02 PM 02/13/2022    1:56 PM 09/23/2021    3:46 PM 05/09/2021    7:24 PM  Diabetic Labs  HbA1c 4.8 - 5.6 % 6.0  6.3  7.5     Chol 100 - 199 mg/dL 409  811  914  782    HDL >39 mg/dL 34  34  30  37    Calc LDL 0 - 99 mg/dL 84  80  64  80    Triglycerides 0 - 149 mg/dL 83  74  75  90    Creatinine 0.57 - 1.00 mg/dL 9.56  2.13  0.86  5.78  <0.30       03/10/2023    3:57 PM 07/15/2022    2:39 PM 07/15/2022    2:09 PM 01/06/2022    4:16 PM 09/23/2021  2:17 PM 06/18/2021    1:53 PM 06/03/2021    4:07 PM  BP/Weight  Systolic BP 111 120 119 128 125 108 134  Diastolic BP 84 80 85 88 84 80 83  Wt. (Lbs)       152  BMI       23.81 kg/m2       No data to display            Updated lab needed at/ before next visit.   Moderate persistent asthma Controlled, no change in medication   Depression, major, single episode, moderate (HCC) Controlled, no change in medication

## 2023-03-22 ENCOUNTER — Other Ambulatory Visit: Payer: Self-pay | Admitting: Family Medicine

## 2023-03-24 ENCOUNTER — Other Ambulatory Visit: Payer: Self-pay | Admitting: Family Medicine

## 2023-03-26 ENCOUNTER — Telehealth: Payer: 59 | Admitting: Family Medicine

## 2023-03-26 DIAGNOSIS — U071 COVID-19: Secondary | ICD-10-CM | POA: Diagnosis not present

## 2023-03-26 MED ORDER — PROMETHAZINE-DM 6.25-15 MG/5ML PO SYRP
5.0000 mL | ORAL_SOLUTION | Freq: Four times a day (QID) | ORAL | 0 refills | Status: AC | PRN
Start: 2023-03-26 — End: 2023-04-05

## 2023-03-26 MED ORDER — NIRMATRELVIR/RITONAVIR (PAXLOVID)TABLET
3.0000 | ORAL_TABLET | Freq: Two times a day (BID) | ORAL | 0 refills | Status: AC
Start: 2023-03-26 — End: 2023-03-31

## 2023-03-26 MED ORDER — PREDNISONE 20 MG PO TABS
20.0000 mg | ORAL_TABLET | Freq: Two times a day (BID) | ORAL | 0 refills | Status: AC
Start: 2023-03-26 — End: 2023-03-31

## 2023-03-26 NOTE — Progress Notes (Signed)
Virtual Visit Consent   SYAN Acevedo, you are scheduled for a virtual visit with a [place] provider today. Just as with appointments in the office, your consent must be obtained to participate. Your consent will be active for this visit and any virtual visit you may have with one of our providers in the next 365 days. If you have a MyChart account, a copy of this consent can be sent to you electronically.  As this is a virtual visit, video technology does not allow for your provider to perform a traditional examination. This may limit your provider's ability to fully assess your condition. If your provider identifies any concerns that need to be evaluated in person or the need to arrange testing (such as labs, EKG, etc.), we will make arrangements to do so. Although advances in technology are sophisticated, we cannot ensure that it will always work on either your end or our end. If the connection with a video visit is poor, the visit may have to be switched to a telephone visit. With either a video or telephone visit, we are not always able to ensure that we have a secure connection.  By engaging in this virtual visit, you consent to the provision of healthcare and authorize for your insurance to be billed (if applicable) for the services provided during this visit. Depending on your insurance coverage, you may receive a charge related to this service.  I need to obtain your verbal consent now. Are you willing to proceed with your visit today? Janet Acevedo has provided verbal consent on 03/26/2023 for a virtual visit (video or telephone). Georgana Curio, FNP  Date: 03/26/2023 6:52 PM  Virtual Visit via Video Note   I, Georgana Curio, connected with  Janet Acevedo  ([id number], 1990/09/10) on 03/26/23 at  6:30 PM EDT by a video-enabled telemedicine application and verified that I am speaking with the correct person using two identifiers.  Location: Patient: Virtual Visit Location Patient:  Home Provider: Virtual Visit Location Provider: Home Office   I discussed the limitations of evaluation and management by telemedicine and the availability of in person appointments. The patient expressed understanding and agreed to proceed.    History of Present Illness: Janet Acevedo is a 32 y.o. who identifies as a nonbinary who was assigned adult at birth, and is being seen today for covid positive testing in home with fever chills, body aches, cough worse at night. She has asthma and is using her nebulier. [name]  HPI: HPI  Problems:  Patient Active Problem List   Diagnosis Date Noted   Elevated serum creatinine 03/15/2023   Leukocytosis 03/15/2023   Decubital ulcer 02/16/2023   Annual visit for general adult medical examination with abnormal findings 07/17/2022   Urinary frequency 03/04/2022   Type 2 diabetes mellitus with other specified complication (HCC) 02/19/2022   Muscle cramps 11/06/2021   Palpitations 05/18/2021   Audible heartbeat in ear 05/18/2021   Hypertension 05/07/2021   Vitamin D deficiency 03/03/2021   Neck swelling 02/23/2020   Wheelchair dependent 01/03/2020   Depression, major, single episode, moderate (HCC) 09/05/2017   Dysphagia 08/24/2017   GAD (generalized anxiety disorder) 02/08/2017   Moderate persistent asthma 01/14/2017   H/O spinal fusion 01/14/2017   Osteopenia determined by x-ray 01/14/2017   Dyslipidemia 09/28/2016   Seborrheic dermatitis of scalp 02/07/2015   Urinary incontinence due to severe physical disability 02/12/2014   DERMATOMYCOSIS 06/29/2010   GERD 06/29/2010   Type II spinal muscular atrophy (  HCC) 02/24/2010   Seasonal allergies 02/24/2010    Allergies:  Allergies  Allergen Reactions   Other Diarrhea   Ciprofloxacin Nausea And Vomiting   Medications:  Current Outpatient Medications:    nirmatrelvir/ritonavir (PAXLOVID) 20 x 150 MG & 10 x 100MG  TABS, Take 3 tablets by mouth 2 (two) times daily for 5 days. Promethazine dm(Take  nirmatrelvir 150 mg two tablets twice daily for 5 days and ritonavir 100 mg one tablet twice daily for 5 days) Patient GFR is normal, Disp: 30 tablet, Rfl: 0   predniSONE (DELTASONE) 20 MG tablet, Take 1 tablet (20 mg total) by mouth 2 (two) times daily with a meal for 5 days., Disp: 10 tablet, Rfl: 0   promethazine-dextromethorphan (PROMETHAZINE-DM) 6.25-15 MG/5ML syrup, Take 5 mLs by mouth 4 (four) times daily as needed for up to 10 days for cough., Disp: 118 mL, Rfl: 0   albuterol (ACCUNEB) 1.25 MG/3ML nebulizer solution, Take 3 mLs (1.25 mg total) by nebulization every 4 (four) hours as needed. Dx J45.40, Disp: 75 mL, Rfl: 1   albuterol (PROVENTIL) (2.5 MG/3ML) 0.083% nebulizer solution, Take 3 mLs (2.5 mg total) by nebulization every 6 (six) hours as needed for wheezing or shortness of breath., Disp: 150 mL, Rfl: 1   albuterol (VENTOLIN HFA) 108 (90 Base) MCG/ACT inhaler, Inhale 1-2 puffs into the lungs every 6 (six) hours as needed for wheezing or shortness of breath., Disp: 18 g, Rfl: 0   blood glucose meter kit and supplies, Dispense based on patient and insurance preference. Once daily testing DX E11.9 (Patient not taking: Reported on 03/10/2023), Disp: 1 each, Rfl: 0   busPIRone (BUSPAR) 7.5 MG tablet, TAKE 1 TABLET BY MOUTH 2 TIMES DAILY., Disp: 180 tablet, Rfl: 2   clotrimazole-betamethasone (LOTRISONE) cream, APPLY TO AFFECTED AREA TWICE A DAY, Disp: 45 g, Rfl: 1   cromolyn (OPTICROM) 4 % ophthalmic solution, PLACE 1 DROP INTO BOTH EYES FOUR TIMES DAILY, Disp: 40 mL, Rfl: 8   fluconazole (DIFLUCAN) 150 MG tablet, Take 150 mg by mouth once., Disp: , Rfl:    Fluocinolone Acetonide Scalp 0.01 % OIL, APPLY SPARINGLY TO SCALP TWO TIMES WEEKLY, AS NEEDED, FOR ITCH AND FLAKING, Disp: 118.28 mL, Rfl: 3   glucose blood test strip, Use as instructed oonce daily testing dx e11.9 (Patient not taking: Reported on 03/10/2023), Disp: 100 each, Rfl: 5   hydrocortisone 2.5 % cream, APPLY TO AFFECTED AREA  TWICE A DAY, Disp: 28 g, Rfl: 1   JARDIANCE 10 MG TABS tablet, TAKE 1 TABLET BY MOUTH EVERY DAY BEFORE BREAKFAST, Disp: 30 tablet, Rfl: 3   ketoconazole (NIZORAL) 2 % shampoo, APPLY TO SCALP FOR 5 MINUTES AND RINSE OUT, Disp: 120 mL, Rfl: 4   Lancets 30G MISC, Once daily testing dx e11.9 (Patient not taking: Reported on 03/10/2023), Disp: 100 each, Rfl: 5   levocetirizine (XYZAL) 5 MG tablet, TAKE 1 TABLET BY MOUTH EVERY DAY IN THE EVENING, Disp: 90 tablet, Rfl: 3   metoprolol succinate (TOPROL-XL) 25 MG 24 hr tablet, TAKE 1 TABLET (25 MG TOTAL) BY MOUTH DAILY., Disp: 90 tablet, Rfl: 1   montelukast (SINGULAIR) 10 MG tablet, TAKE 1 TABLET BY MOUTH EVERY DAY, Disp: 90 tablet, Rfl: 3   nystatin (MYCOSTATIN/NYSTOP) powder, APPLY TO AFFECTED AREA 3 TIMES A DAY, Disp: 60 g, Rfl: 1   olmesartan (BENICAR) 20 MG tablet, TAKE 1 TABLET BY MOUTH EVERY DAY, Disp: 90 tablet, Rfl: 1   omeprazole (PRILOSEC) 20 MG capsule, TAKE 2 CAPSULES  BY MOUTH TWICE DAILY - PATIENT HAS TROUBLE SWALLOWING, Disp: 360 capsule, Rfl: 3   Respiratory Therapy Supplies (NEBULIZER/TUBING/MOUTHPIECE) KIT, Use as directed. Insurance preference. Nebulizer kit and mask, Disp: 1 kit, Rfl: 0   Risdiplam (EVRYSDI) 0.75 MG/ML SOLR, , Disp: , Rfl:    rosuvastatin (CRESTOR) 5 MG tablet, TAKE 1 TABLET (5 MG TOTAL) BY MOUTH DAILY., Disp: 90 tablet, Rfl: 3   sertraline (ZOLOFT) 50 MG tablet, TAKE 1 TABLET BY MOUTH EVERY DAY, Disp: 90 tablet, Rfl: 2   Spacer/Aero-Holding Chambers (AEROCHAMBER PLUS) inhaler, Use as instructed, Disp: 1 each, Rfl: 2   SYMBICORT 160-4.5 MCG/ACT inhaler, INHALE 2 PUFFS INTO THE LUNGS TWICE A DAY, Disp: 10.2 each, Rfl: 6   UNABLE TO FIND, Gloves- 1 box per month as needed, Disp: 200 each, Rfl: prn   UNABLE TO FIND, Under pads- for use daily as needed, Disp: 100 each, Rfl: prn   UNABLE TO FIND, Incontinence liners- use as needed for incontinence Gloves- 1 box as needed for incontinence DX urinary incontinence, Disp: 1 each,  Rfl: 0   UNABLE TO FIND, Nebulizer machine and tubing  DX J45.40, Disp: 1 each, Rfl: 0  Observations/Objective: Patient is well-developed, well-nourished in no acute distress.  Resting comfortably  at home.  Head is normocephalic, atraumatic.  No labored breathing.  Speech is clear and coherent with logical content.  Patient is alert and oriented at baseline.    Assessment and Plan: 1. COVID  Increase fluids, humidifier at night, tylenol or ibuprofen as directed, mvi with vit d and zinc, quarantine discussed, UC if sx worsen.   Follow Up Instructions: I discussed the assessment and treatment plan with the patient. The patient was provided an opportunity to ask questions and all were answered. The patient agreed with the plan and demonstrated an understanding of the instructions.  A copy of instructions were sent to the patient via MyChart unless otherwise noted below.     The patient was advised to call back or seek an in-person evaluation if the symptoms worsen or if the condition fails to improve as anticipated.  Time:  I spent 10 minutes with the patient via telehealth technology discussing the above problems/concerns.    Georgana Curio, FNP

## 2023-03-26 NOTE — Patient Instructions (Signed)

## 2023-03-29 ENCOUNTER — Encounter: Payer: Self-pay | Admitting: Family Medicine

## 2023-04-01 ENCOUNTER — Inpatient Hospital Stay: Payer: 59 | Admitting: Oncology

## 2023-04-01 ENCOUNTER — Encounter: Payer: Self-pay | Admitting: Family Medicine

## 2023-04-02 ENCOUNTER — Telehealth: Payer: 59 | Admitting: Physician Assistant

## 2023-04-02 DIAGNOSIS — B9689 Other specified bacterial agents as the cause of diseases classified elsewhere: Secondary | ICD-10-CM

## 2023-04-02 DIAGNOSIS — J029 Acute pharyngitis, unspecified: Secondary | ICD-10-CM | POA: Diagnosis not present

## 2023-04-02 DIAGNOSIS — J019 Acute sinusitis, unspecified: Secondary | ICD-10-CM | POA: Diagnosis not present

## 2023-04-02 MED ORDER — AMOXICILLIN-POT CLAVULANATE 875-125 MG PO TABS
1.0000 | ORAL_TABLET | Freq: Two times a day (BID) | ORAL | 0 refills | Status: AC
Start: 2023-04-02 — End: ?

## 2023-04-02 NOTE — Progress Notes (Signed)
E-Visit for Sinus Problems  We are sorry that you are not feeling well.  Here is how we plan to help!  Based on what you have shared with me it looks like you have sinusitis.  Sinusitis is inflammation and infection in the sinus cavities of the head.  Based on your presentation I believe you most likely have Acute Bacterial Sinusitis.  This is an infection caused by bacteria and is treated with antibiotics. I have prescribed Augmentin 875mg/125mg one tablet twice daily with food, for 7 days. You may use an oral decongestant such as Mucinex D or if you have glaucoma or high blood pressure use plain Mucinex. Saline nasal spray help and can safely be used as often as needed for congestion.  If you develop worsening sinus pain, fever or notice severe headache and vision changes, or if symptoms are not better after completion of antibiotic, please schedule an appointment with a health care provider.    Sinus infections are not as easily transmitted as other respiratory infection, however we still recommend that you avoid close contact with loved ones, especially the very young and elderly.  Remember to wash your hands thoroughly throughout the day as this is the number one way to prevent the spread of infection!  Home Care: Only take medications as instructed by your medical team. Complete the entire course of an antibiotic. Do not take these medications with alcohol. A steam or ultrasonic humidifier can help congestion.  You can place a towel over your head and breathe in the steam from hot water coming from a faucet. Avoid close contacts especially the very young and the elderly. Cover your mouth when you cough or sneeze. Always remember to wash your hands.  Get Help Right Away If: You develop worsening fever or sinus pain. You develop a severe head ache or visual changes. Your symptoms persist after you have completed your treatment plan.  Make sure you Understand these instructions. Will watch  your condition. Will get help right away if you are not doing well or get worse.  Thank you for choosing an e-visit.  Your e-visit answers were reviewed by a board certified advanced clinical practitioner to complete your personal care plan. Depending upon the condition, your plan could have included both over the counter or prescription medications.  Please review your pharmacy choice. Make sure the pharmacy is open so you can pick up prescription now. If there is a problem, you may contact your provider through MyChart messaging and have the prescription routed to another pharmacy.  Your safety is important to us. If you have drug allergies check your prescription carefully.   For the next 24 hours you can use MyChart to ask questions about today's visit, request a non-urgent call back, or ask for a work or school excuse. You will get an email in the next two days asking about your experience. I hope that your e-visit has been valuable and will speed your recovery.  I have spent 5 minutes in review of e-visit questionnaire, review and updating patient chart, medical decision making and response to patient.   Jennifer M Burnette, PA-C  

## 2023-04-08 ENCOUNTER — Other Ambulatory Visit: Payer: Self-pay | Admitting: Family Medicine

## 2023-04-09 ENCOUNTER — Telehealth: Payer: 59 | Admitting: Family Medicine

## 2023-04-14 ENCOUNTER — Ambulatory Visit: Payer: 59 | Admitting: Internal Medicine

## 2023-04-14 ENCOUNTER — Other Ambulatory Visit: Payer: Self-pay | Admitting: Family Medicine

## 2023-04-16 ENCOUNTER — Inpatient Hospital Stay: Payer: 59 | Admitting: Oncology

## 2023-04-19 ENCOUNTER — Encounter: Payer: Self-pay | Admitting: Family Medicine

## 2023-04-20 ENCOUNTER — Other Ambulatory Visit: Payer: Self-pay

## 2023-04-20 DIAGNOSIS — K219 Gastro-esophageal reflux disease without esophagitis: Secondary | ICD-10-CM

## 2023-04-20 NOTE — Telephone Encounter (Signed)
Referral sent 

## 2023-04-21 ENCOUNTER — Encounter (INDEPENDENT_AMBULATORY_CARE_PROVIDER_SITE_OTHER): Payer: Self-pay | Admitting: *Deleted

## 2023-04-23 DIAGNOSIS — E559 Vitamin D deficiency, unspecified: Secondary | ICD-10-CM | POA: Diagnosis not present

## 2023-04-23 DIAGNOSIS — I10 Essential (primary) hypertension: Secondary | ICD-10-CM | POA: Diagnosis not present

## 2023-04-23 DIAGNOSIS — D72829 Elevated white blood cell count, unspecified: Secondary | ICD-10-CM | POA: Diagnosis not present

## 2023-04-23 DIAGNOSIS — R7989 Other specified abnormal findings of blood chemistry: Secondary | ICD-10-CM | POA: Diagnosis not present

## 2023-04-26 ENCOUNTER — Ambulatory Visit: Payer: 59 | Admitting: Podiatry

## 2023-04-27 ENCOUNTER — Ambulatory Visit (INDEPENDENT_AMBULATORY_CARE_PROVIDER_SITE_OTHER): Payer: 59 | Admitting: Gastroenterology

## 2023-04-27 ENCOUNTER — Encounter (INDEPENDENT_AMBULATORY_CARE_PROVIDER_SITE_OTHER): Payer: Self-pay | Admitting: Gastroenterology

## 2023-04-27 DIAGNOSIS — K219 Gastro-esophageal reflux disease without esophagitis: Secondary | ICD-10-CM

## 2023-04-27 MED ORDER — SUCRALFATE 1 G PO TABS
1.0000 g | ORAL_TABLET | Freq: Three times a day (TID) | ORAL | 1 refills | Status: DC
Start: 2023-04-27 — End: 2023-06-28

## 2023-04-27 NOTE — Progress Notes (Signed)
Vista Lawman, M.D. Gastroenterology & Hepatology [facility]astroenter[facility] [address] [place], [place] [address] Primary Care Physician: Kerri Perches, MD [address] [place] [address]  This is a telephone virtual visit.  It required patient-provider interaction for the medical decision making as documented below. The patient has consented and agreed to proceed with a Telehealth encounter.  VIRTUAL VISIT NOTE I will communicate my assessment and recommendations to the referring MD via EMR.  Chief Complaint:  GERD, Throat and chest burning   History of Present Illness: Janet Acevedo is a 32 y.o. adult with spinal muscular dystrophy who is contacted over the phone as initial visit for long standing GERD , Throat and chest burning   Patient reports that few weeks back she had COVID.  After her symptoms worsen.  Reports burning in the middle of the chest sore throat.  She denies any regurgitation odynophagia or difficulty swallowing at this time. Patient takes omeprazole 20 mg which does seem to help with the symptoms but takes the medication usually after food Patient has noticed that certain foods will worsen her symptoms such as fatty food spicy food and carbonated beverages  Patient was last seen in [facility] in 2019 for dysphagia weight loss .where EGD did not reveal any etiology of her symptoms.  No dilation was done at that time due to micrognathia.  Plan was to perform HRM where patient did not follow with them thereafter as she reports her symptoms resolved  The patient denies having any nausea, vomiting, fever, chills, hematochezia, melena, hematemesis, abdominal distention, abdominal pain, diarrhea, jaundice, pruritus or weight loss.  Last EGD:2019 E[facility]  IMPRESSIONS: 1. Normal esophegeal mucosa and patent esophageal lumen with no evidence of inflammation, stricture, ring or web. Random esophageal biopsies were  performed in the distal and proximal esophagus 2. Normal gastric mucosa with no evidence of ulceration, masses, inflammatory changes or vascular ectasias. Normal appearing antrum. Few polyps in the gastric fundus, biopsies of the largest polyp were performed 3. The duodenal mucosa showed no abnormalities 4. Retroflexion was performed in the stomach and revealed no abnormalities  .  "GASTRIC POLYPS", BIOPSY:      Fundic gland polyps.      No. H. pylori organisms identified on routine H&E.  B.  DISTAL ESOPHAGUS, BIOPSY:      Reactive squamous epithelium with scattered neutrophils and eosinophils (up to 3 eosinophils/high power field).  C.  PROXIMAL ESOPHAGUS, BIOPSY:      Reactive squamous epithelium with scattered neutrophils and eosinophils (up to 2 eosinophils/high power field).  Esophaggram   No evidence of aspiration or penetration with any tested consistencies. Please refer to the speech pathologist's report for further detail and dietary recommendations.    Last Colonoscopy:None  FHx: neg for any gastrointestinal/liver disease, no malignancies Social: neg smoking, alcohol or illicit drug use Surgical: non contributory  Past Medical History: Past Medical History:  Diagnosis Date   Allergy    Annual visit for general adult medical examination with abnormal findings 07/17/2022   Anxiety    Asthma    Depression, major, single episode, moderate (HCC) 09/05/2017   GERD (gastroesophageal reflux disease)    Hypertension 05/07/2021   Neuromuscular disorder (HCC)    Osteomyelitis (HCC) June 2011   Otitis externa, eczematoid 05/28/2019   Perennial allergic rhinitis    Seborrheic dermatitis of scalp 2006   Spinal muscle atrophy (HCC)    type 2/3 18 months  Type 2 diabetes mellitus with other specified complication (HCC) 02/19/2022    Past Surgical History: Past Surgical History:  Procedure Laterality Date   bilateral hip reinsertion  under age 56   hips out of socket, pt  was only able to cruise hold on and move short distances    BONE BIOPSY     spinal bone    MIRENA PLACED 10/27/10     SPINAL FUSION  1999   rods placed in thoracolumbar spine to excessive scoliosis primarily    SPINE SURGERY N/A    [employer] 07/29/2020    Family History: Family History  Problem Relation Age of Onset   Hyperlipidemia Mother    Hypertension Mother    Hypertension Brother    Diabetes Brother        borderline    Anxiety disorder Brother    Depression Brother    Drug abuse Brother     Social History: Social History   Tobacco Use  Smoking Status Never  Smokeless Tobacco Never   Social History   Substance and Sexual Activity  Alcohol Use No   Social History   Substance and Sexual Activity  Drug Use No    Allergies: Allergies  Allergen Reactions   Other Diarrhea   Ciprofloxacin Nausea And Vomiting    Medications: Current Outpatient Medications  Medication Sig Dispense Refill   ACCU-CHEK GUIDE test strip USE AS INSTRUCTED ONCE DAILY TESTING DX E11.9 100 strip 5   albuterol (ACCUNEB) 1.25 MG/3ML nebulizer solution Take 3 mLs (1.25 mg total) by nebulization every 4 (four) hours as needed. Dx J45.40 75 mL 1   albuterol (PROVENTIL) (2.5 MG/3ML) 0.083% nebulizer solution Take 3 mLs (2.5 mg total) by nebulization every 6 (six) hours as needed for wheezing or shortness of breath. 150 mL 1   albuterol (VENTOLIN HFA) 108 (90 Base) MCG/ACT inhaler Inhale 1-2 puffs into the lungs every 6 (six) hours as needed for wheezing or shortness of breath. 18 g 0   blood glucose meter kit and supplies Dispense based on patient and insurance preference. Once daily testing DX E11.9 1 each 0   busPIRone (BUSPAR) 7.5 MG tablet TAKE 1 TABLET BY MOUTH 2 TIMES DAILY. 180 tablet 2   clotrimazole-betamethasone (LOTRISONE) cream APPLY TO AFFECTED AREA TWICE A DAY 45 g 1   cromolyn (OPTICROM) 4 % ophthalmic solution PLACE 1 DROP INTO BOTH EYES FOUR TIMES DAILY 40 mL 8   Fluocinolone  Acetonide Scalp 0.01 % OIL APPLY SPARINGLY TO SCALP TWO TIMES WEEKLY, AS NEEDED, FOR ITCH AND FLAKING 118.28 mL 3   hydrocortisone 2.5 % cream APPLY TO AFFECTED AREA TWICE A DAY 28 g 1   JARDIANCE 10 MG TABS tablet TAKE 1 TABLET BY MOUTH EVERY DAY BEFORE BREAKFAST 30 tablet 3   ketoconazole (NIZORAL) 2 % shampoo APPLY TO SCALP FOR 5 MINUTES AND RINSE OUT 120 mL 4   Lancets 30G MISC Once daily testing dx e11.9 100 each 5   levocetirizine (XYZAL) 5 MG tablet TAKE 1 TABLET BY MOUTH EVERY DAY IN THE EVENING 90 tablet 3   levonorgestrel (MIRENA) 20 MCG/DAY IUD 1 each by Intrauterine route once.     metoprolol succinate (TOPROL-XL) 25 MG 24 hr tablet TAKE 1 TABLET (25 MG TOTAL) BY MOUTH DAILY. 90 tablet 1   montelukast (SINGULAIR) 10 MG tablet TAKE 1 TABLET BY MOUTH EVERY DAY 90 tablet 3   nystatin (MYCOSTATIN/NYSTOP) powder APPLY TO AFFECTED AREA 3 TIMES A DAY 60 g 1  olmesartan (BENICAR) 20 MG tablet TAKE 1 TABLET BY MOUTH EVERY DAY 90 tablet 1   omeprazole (PRILOSEC) 20 MG capsule TAKE 2 CAPSULES BY MOUTH TWICE DAILY - PATIENT HAS TROUBLE SWALLOWING 360 capsule 3   Respiratory Therapy Supplies (NEBULIZER/TUBING/MOUTHPIECE) KIT Use as directed. Insurance preference. Nebulizer kit and mask 1 kit 0   Risdiplam (EVRYSDI) 0.75 MG/ML SOLR      rosuvastatin (CRESTOR) 5 MG tablet TAKE 1 TABLET (5 MG TOTAL) BY MOUTH DAILY. 90 tablet 3   sertraline (ZOLOFT) 50 MG tablet TAKE 1 TABLET BY MOUTH EVERY DAY 90 tablet 2   Spacer/Aero-Holding Chambers (AEROCHAMBER PLUS) inhaler Use as instructed 1 each 2   SYMBICORT 160-4.5 MCG/ACT inhaler INHALE 2 PUFFS INTO THE LUNGS TWICE A DAY 10.2 each 6   UNABLE TO FIND Gloves- 1 box per month as needed 200 each prn   UNABLE TO FIND Under pads- for use daily as needed 100 each prn   UNABLE TO FIND Incontinence liners- use as needed for incontinence Gloves- 1 box as needed for incontinence DX urinary incontinence 1 each 0   UNABLE TO FIND Nebulizer machine and tubing  DX  J45.40 1 each 0   No current facility-administered medications for this visit.    Review of Systems: GENERAL: negative for malaise, night sweats HEENT: No changes in hearing or vision, no nose bleeds or other nasal problems. NECK: Negative for lumps, goiter, pain and significant neck swelling RESPIRATORY: Negative for cough, wheezing CARDIOVASCULAR: Negative for chest pain, leg swelling, palpitations, orthopnea GI: SEE HPI MUSCULOSKELETAL: Negative for joint pain or swelling, back pain, and muscle pain. SKIN: Negative for lesions, rash PSYCH: Negative for sleep disturbance, mood disorder and recent psychosocial stressors. HEMATOLOGY Negative for prolonged bleeding, bruising easily, and swollen nodes. ENDOCRINE: Negative for cold or heat intolerance, polyuria, polydipsia and goiter. NEURO: negative for tremor, gait imbalance, syncope and seizures. The remainder of the review of systems is noncontributory.   Physical Exam: No exam was performed as this was a telephone encounter  Imaging/Labs: as above  I personally reviewed and interpreted the available labs, imaging and endoscopic files.  Impression and Plan:Janet Acevedo is a 32 y.o. adult with spinal muscular dystrophy who is contacted over the phone as initial visit for long standing GERD , Throat and chest burning   #Refractory GERD  Patient's symptoms worsened after COVID.  Have noticed relationship with certain food which are her trigger foods such as spicy food fatty food or carbonated beverages  Patient was last seen in [facility] in 2019 for dysphagia weight loss .where EGD did not reveal any etiology of her symptoms.  No dilation was done at that time due to micrognathia.  Plan was to perform HRM where patient did not follow with them thereafter as she reports her symptoms resolved  Patient currently reporting only burning sensation in the middle of the chest and sore throat.  She does have some response with omeprazole 20  mg although taking it incorrectly as she takes it after food  Advised to take omeprazole 20 mg twice daily 30 to 60 minutes before meal  Avoid Fatty food spicy food and carbonated beverages  Patient was given option for upper endoscopy with BRAVO placement given persistent symptoms which she will think about  Will add Carafate 3 times daily  All questions were answered.      Total visit time: I spent a total of  26 minutes  Vista Lawman, MD Gastroenterology and Hepatology [facility]  [facility] Gastroenterology

## 2023-05-10 ENCOUNTER — Encounter: Payer: Self-pay | Admitting: Oncology

## 2023-05-10 ENCOUNTER — Inpatient Hospital Stay: Payer: 59 | Attending: Oncology | Admitting: Oncology

## 2023-05-10 ENCOUNTER — Inpatient Hospital Stay: Payer: 59

## 2023-05-10 VITALS — BP 135/83 | HR 81 | Temp 99.4°F | Resp 17

## 2023-05-10 DIAGNOSIS — D72829 Elevated white blood cell count, unspecified: Secondary | ICD-10-CM | POA: Diagnosis not present

## 2023-05-10 DIAGNOSIS — Z7951 Long term (current) use of inhaled steroids: Secondary | ICD-10-CM | POA: Insufficient documentation

## 2023-05-10 DIAGNOSIS — Z79899 Other long term (current) drug therapy: Secondary | ICD-10-CM | POA: Insufficient documentation

## 2023-05-10 DIAGNOSIS — Z993 Dependence on wheelchair: Secondary | ICD-10-CM | POA: Diagnosis not present

## 2023-05-10 DIAGNOSIS — Z793 Long term (current) use of hormonal contraceptives: Secondary | ICD-10-CM | POA: Diagnosis not present

## 2023-05-10 DIAGNOSIS — G7109 Other specified muscular dystrophies: Secondary | ICD-10-CM | POA: Insufficient documentation

## 2023-05-10 DIAGNOSIS — J45901 Unspecified asthma with (acute) exacerbation: Secondary | ICD-10-CM | POA: Insufficient documentation

## 2023-05-10 DIAGNOSIS — E1169 Type 2 diabetes mellitus with other specified complication: Secondary | ICD-10-CM | POA: Diagnosis not present

## 2023-05-10 LAB — CBC WITH DIFFERENTIAL/PLATELET
Abs Immature Granulocytes: 0.02 10*3/uL (ref 0.00–0.07)
Basophils Absolute: 0.1 10*3/uL (ref 0.0–0.1)
Basophils Relative: 1 %
Eosinophils Absolute: 0.4 10*3/uL (ref 0.0–0.5)
Eosinophils Relative: 4 %
HCT: 40.3 % (ref 36.0–46.0)
Hemoglobin: 12.9 g/dL (ref 12.0–15.0)
Immature Granulocytes: 0 %
Lymphocytes Relative: 37 %
Lymphs Abs: 3.7 10*3/uL (ref 0.7–4.0)
MCH: 27 pg (ref 26.0–34.0)
MCHC: 32 g/dL (ref 30.0–36.0)
MCV: 84.3 fL (ref 80.0–100.0)
Monocytes Absolute: 0.9 10*3/uL (ref 0.1–1.0)
Monocytes Relative: 9 %
Neutro Abs: 5 10*3/uL (ref 1.7–7.7)
Neutrophils Relative %: 49 %
Platelets: 227 10*3/uL (ref 150–400)
RBC: 4.78 MIL/uL (ref 3.87–5.11)
RDW: 15.9 % — ABNORMAL HIGH (ref 11.5–15.5)
WBC: 10 10*3/uL (ref 4.0–10.5)
nRBC: 0 % (ref 0.0–0.2)

## 2023-05-10 LAB — LACTATE DEHYDROGENASE: LDH: 108 U/L (ref 98–192)

## 2023-05-10 NOTE — Assessment & Plan Note (Signed)
-  Patient has some occasional leukocytosis with neutrophil predominance -Patient stated that she uses steroids for asthma exacerbation up to 4 times a year.  Leukocytosis is likely coinciding with this but needs a closer watch. -Patient has no B symptoms at this time -Will repeat CBC today and LDH.  If normal does not need further follow-up with hematology.  I assume that her leukocytosis is likely secondary to occasional steroid use along with chronic infection/inflammation of her sacral wound with very less suspicion for hematologic malignancy.  Would recommend primary care to keep a close eye on steroid use and leukocytosis and if needed referred back for further workup.

## 2023-05-10 NOTE — Progress Notes (Signed)
[facility] HEMATOLOGY NEW VISIT  Kerri Perches, MD  REASON FOR REFERRAL: Leukocytosis   HISTORY OF PRESENT ILLNESS: Janet Acevedo 32 y.o. adult referred for leukocytosis.  She is accompanied by her cousin today.  She has a past medical history of spinal muscular dystrophy is restricted to a wheelchair, asthma, decubitus ulcer.  She has no complaints today and has been doing very well.  Patient denies fever, chills, abdominal pain, urinary symptoms, night sweats, lymphadenopathy, weight loss and loss of appetite.  She stated that she takes steroids occasionally up to 4 times a year for asthma.  Patient has no history of smoking, using alcohol.  No family history of leukemia.  Lives with her mom and Wyn Forster and needs help for personal care.   I have reviewed the past medical history, past surgical history, social history and family history with the patient   ALLERGIES:  is allergic to other and ciprofloxacin.  MEDICATIONS:  Current Outpatient Medications  Medication Sig Dispense Refill   ACCU-CHEK GUIDE test strip USE AS INSTRUCTED ONCE DAILY TESTING DX E11.9 100 strip 5   albuterol (ACCUNEB) 1.25 MG/3ML nebulizer solution Take 3 mLs (1.25 mg total) by nebulization every 4 (four) hours as needed. Dx J45.40 75 mL 1   albuterol (PROVENTIL) (2.5 MG/3ML) 0.083% nebulizer solution Take 3 mLs (2.5 mg total) by nebulization every 6 (six) hours as needed for wheezing or shortness of breath. 150 mL 1   albuterol (VENTOLIN HFA) 108 (90 Base) MCG/ACT inhaler Inhale 1-2 puffs into the lungs every 6 (six) hours as needed for wheezing or shortness of breath. 18 g 0   blood glucose meter kit and supplies Dispense based on patient and insurance preference. Once daily testing DX E11.9 1 each 0   busPIRone (BUSPAR) 7.5 MG tablet TAKE 1 TABLET BY MOUTH 2 TIMES DAILY. 180 tablet 2   clotrimazole-betamethasone (LOTRISONE) cream APPLY TO AFFECTED AREA TWICE A DAY 45  g 1   cromolyn (OPTICROM) 4 % ophthalmic solution PLACE 1 DROP INTO BOTH EYES FOUR TIMES DAILY 40 mL 8   Fluocinolone Acetonide Scalp 0.01 % OIL APPLY SPARINGLY TO SCALP TWO TIMES WEEKLY, AS NEEDED, FOR ITCH AND FLAKING 118.28 mL 3   hydrocortisone 2.5 % cream APPLY TO AFFECTED AREA TWICE A DAY 28 g 1   JARDIANCE 10 MG TABS tablet TAKE 1 TABLET BY MOUTH EVERY DAY BEFORE BREAKFAST 30 tablet 3   ketoconazole (NIZORAL) 2 % shampoo APPLY TO SCALP FOR 5 MINUTES AND RINSE OUT 120 mL 4   Lancets 30G MISC Once daily testing dx e11.9 100 each 5   levocetirizine (XYZAL) 5 MG tablet TAKE 1 TABLET BY MOUTH EVERY DAY IN THE EVENING 90 tablet 3   levonorgestrel (MIRENA) 20 MCG/DAY IUD 1 each by Intrauterine route once.     metoprolol succinate (TOPROL-XL) 25 MG 24 hr tablet TAKE 1 TABLET (25 MG TOTAL) BY MOUTH DAILY. 90 tablet 1   montelukast (SINGULAIR) 10 MG tablet TAKE 1 TABLET BY MOUTH EVERY DAY 90 tablet 3   nystatin (MYCOSTATIN/NYSTOP) powder APPLY TO AFFECTED AREA 3 TIMES A DAY 60 g 1   olmesartan (BENICAR) 20 MG tablet TAKE 1 TABLET BY MOUTH EVERY DAY 90 tablet 1   omeprazole (PRILOSEC) 20 MG capsule TAKE 2 CAPSULES BY MOUTH TWICE DAILY - PATIENT HAS TROUBLE SWALLOWING 360 capsule 3   Respiratory Therapy Supplies (NEBULIZER/TUBING/MOUTHPIECE) KIT Use as directed. Insurance preference. Nebulizer kit and mask 1 kit  0   Risdiplam (EVRYSDI) 0.75 MG/ML SOLR      rosuvastatin (CRESTOR) 5 MG tablet TAKE 1 TABLET (5 MG TOTAL) BY MOUTH DAILY. 90 tablet 3   sertraline (ZOLOFT) 50 MG tablet TAKE 1 TABLET BY MOUTH EVERY DAY 90 tablet 2   Spacer/Aero-Holding Chambers (AEROCHAMBER PLUS) inhaler Use as instructed 1 each 2   sucralfate (CARAFATE) 1 g tablet Take 1 tablet (1 g total) by mouth 4 (four) times daily -  with meals and at bedtime. 120 tablet 1   SYMBICORT 160-4.5 MCG/ACT inhaler INHALE 2 PUFFS INTO THE LUNGS TWICE A DAY 10.2 each 6   UNABLE TO FIND Gloves- 1 box per month as needed 200 each prn   UNABLE  TO FIND Under pads- for use daily as needed 100 each prn   UNABLE TO FIND Incontinence liners- use as needed for incontinence Gloves- 1 box as needed for incontinence DX urinary incontinence 1 each 0   UNABLE TO FIND Nebulizer machine and tubing  DX J45.40 1 each 0   No current facility-administered medications for this visit.     REVIEW OF SYSTEMS:   Constitutional: Denies fevers, chills or night sweats Eyes: Denies blurriness of vision Ears, nose, mouth, throat, and face: Denies mucositis or sore throat Respiratory: Denies cough, dyspnea or wheezes Cardiovascular: Denies palpitation, chest discomfort or lower extremity swelling Gastrointestinal:  Denies nausea, heartburn or change in bowel habits Skin: Denies abnormal skin rashes Lymphatics: Denies new lymphadenopathy or easy bruising Neurological:Denies numbness, tingling or new weaknesses Behavioral/Psych: Mood is stable, no new changes  All other systems were reviewed with the patient and are negative.  PHYSICAL EXAMINATION:   Vitals:   05/10/23 1115  BP: 135/83  Pulse: 81  Resp: 17  Temp: 99.4 F (37.4 C)  SpO2: 100%    GENERAL:alert, no distress and comfortable SKIN: skin color, texture, turgor are normal, no rashes or significant lesions EYES: normal, Conjunctiva are pink and non-injected, sclera clear OROPHARYNX:no exudate, no erythema and lips, buccal mucosa, and tongue normal  NECK: supple, thyroid normal size, non-tender, without nodularity LYMPH:  no palpable lymphadenopathy in the cervical, axillary or inguinal LUNGS: clear to auscultation and percussion with normal breathing effort HEART: regular rate & rhythm and no murmurs and no lower extremity edema  LABORATORY DATA:  I have reviewed the data as listed  Lab Results  Component Value Date   WBC 10.0 05/10/2023   NEUTROABS 5.0 05/10/2023   HGB 12.9 05/10/2023   HCT 40.3 05/10/2023   MCV 84.3 05/10/2023   PLT 227 05/10/2023      Component Value  Date/Time   NA 143 02/26/2023 1349   K 4.5 02/26/2023 1349   CL 101 02/26/2023 1349   CO2 22 02/26/2023 1349   GLUCOSE 92 02/26/2023 1349   GLUCOSE 73 05/09/2021 1924   BUN 9 02/26/2023 1349   CREATININE 0.19 (L) 02/26/2023 1349   CREATININE 0.20 (L) 10/06/2018 1353   CALCIUM 9.2 02/26/2023 1349   PROT 7.4 02/26/2023 1349   ALBUMIN 4.2 02/26/2023 1349   AST 13 02/26/2023 1349   ALT 14 02/26/2023 1349   ALKPHOS 105 02/26/2023 1349   BILITOT 0.4 02/26/2023 1349   GFRNONAA NOT CALCULATED 05/09/2021 1924   GFRNONAA 181 10/06/2018 1353   GFRAA 209 10/06/2018 1353       Chemistry      Component Value Date/Time   NA 143 02/26/2023 1349   K 4.5 02/26/2023 1349   CL 101 02/26/2023 1349  CO2 22 02/26/2023 1349   BUN 9 02/26/2023 1349   CREATININE 0.19 (L) 02/26/2023 1349   CREATININE 0.20 (L) 10/06/2018 1353      Component Value Date/Time   CALCIUM 9.2 02/26/2023 1349   ALKPHOS 105 02/26/2023 1349   AST 13 02/26/2023 1349   ALT 14 02/26/2023 1349   BILITOT 0.4 02/26/2023 1349       ASSESSMENT & PLAN:  Patient is a 32 year old female with past medical history of spinal muscular dystrophy and wheelchair-bound presenting for leukocytosis  Leukocytosis -Patient has some occasional leukocytosis with neutrophil predominance -Patient stated that she uses steroids for asthma exacerbation up to 4 times a year.  Leukocytosis is likely coinciding with this but needs a closer watch. -Patient has no B symptoms at this time -Will repeat CBC today and LDH.  If normal does not need further follow-up with hematology.  I assume that her leukocytosis is likely secondary to occasional steroid use along with chronic infection/inflammation of her sacral wound with very less suspicion for hematologic malignancy.  Would recommend primary care to keep a close eye on steroid use and leukocytosis and if needed referred back for further workup.   Orders Placed This Encounter  Procedures   CBC  with Differential/Platelet    Standing Status:   Future    Number of Occurrences:   1    Standing Expiration Date:   05/09/2024   Lactate dehydrogenase    Standing Status:   Future    Number of Occurrences:   1    Standing Expiration Date:   05/09/2024    The total time spent in the appointment was 25 minutes encounter with patients including review of chart and various tests results, discussions about plan of care and coordination of care plan   All questions were answered. The patient knows to call the clinic with any problems, questions or concerns. No barriers to learning was detected.   Cindie Crumbly, MD 10/14/202412:52 PM

## 2023-05-12 ENCOUNTER — Other Ambulatory Visit: Payer: Self-pay | Admitting: Family Medicine

## 2023-05-12 LAB — MICROALBUMIN / CREATININE URINE RATIO
Creatinine, Urine: 36.7 mg/dL
Microalb/Creat Ratio: 36 mg/g{creat} — ABNORMAL HIGH (ref 0–29)
Microalbumin, Urine: 13.3 ug/mL

## 2023-05-20 ENCOUNTER — Telehealth: Payer: 59 | Admitting: Physician Assistant

## 2023-05-20 DIAGNOSIS — J4541 Moderate persistent asthma with (acute) exacerbation: Secondary | ICD-10-CM

## 2023-05-20 MED ORDER — ALBUTEROL SULFATE (2.5 MG/3ML) 0.083% IN NEBU
2.5000 mg | INHALATION_SOLUTION | Freq: Four times a day (QID) | RESPIRATORY_TRACT | 1 refills | Status: DC | PRN
Start: 2023-05-20 — End: 2023-06-20

## 2023-05-20 MED ORDER — PREDNISONE 20 MG PO TABS
40.0000 mg | ORAL_TABLET | Freq: Every day | ORAL | 0 refills | Status: DC
Start: 2023-05-20 — End: 2023-07-01

## 2023-05-20 NOTE — Progress Notes (Signed)
E-Visit for Asthma  Based on what you have shared with me, it looks like you may have a flare up of your asthma.  Asthma is a chronic (ongoing) lung disease which results in airway obstruction, inflammation and hyper-responsiveness.   Asthma symptoms vary from person to person, with common symptoms including nighttime awakening and decreased ability to participate in normal activities as a result of shortness of breath. It is often triggered by changes in weather, changes in the season, changes in air temperature, or inside (home, school, daycare or work) allergens such as animal dander, mold, mildew, woodstoves or cockroaches.   It can also be triggered by hormonal changes, extreme emotion, physical exertion or an upper respiratory tract illness.     It is important to identify the trigger, and then eliminate or avoid the trigger if possible.   If you have been prescribed medications to be taken on a regular basis, it is important to follow the asthma action plan and to follow guidelines to adjust medication in response to increasing symptoms of decreased peak expiratory flow rate  Treatment: I have prescribed: Prednisone 40mg  by mouth per day for 5 - 7 days. I have also refilled your nebulizer solution  HOME CARE Only take medications as instructed by your medical team. Consider wearing a mask or scarf to improve breathing air temperature have been shown to decrease irritation and decrease exacerbations Get rest. Taking a steamy shower or using a humidifier may help nasal congestion sand ease sore throat pain. You can place a towel over your head and breathe in the steam from hot water coming from a faucet. Using a saline nasal spray works much the same way.  Cough drops, hare candies and sore throat lozenges may ease your cough.  Avoid close contacts especially the very you and the  elderly Cover your mouth if you cough or sneeze Always remember to wash your hands.    GET HELP RIGHT AWAY IF: You develop worsening symptoms; breathlessness at rest, drowsy, confused or agitated, unable to speak in full sentences You have coughing fits You develop a severe headache or visual changes You develop shortness of breath, difficulty breathing or start having chest pain Your symptoms persist after you have completed your treatment plan If your symptoms do not improve within 10 days  MAKE SURE YOU Understand these instructions. Will watch your condition. Will get help right away if you are not doing well or get worse.   Your e-visit answers were reviewed by a board certified advanced clinical practitioner to complete your personal care plan, Depending upon the condition, your plan could have included both over the counter or prescription medications.   Please review your pharmacy choice. Your safety is important to [place]. If you have drug allergies check your prescription carefully.  You can use MyChart to ask questions about today's visit, request a non-urgent  call back, or ask for a work or school excuse for 24 hours related to this e-Visit. If it has been greater than 24 hours you will need to follow up with your provider, or enter a new e-Visit to address those concerns.   You will get an e-mail in the next two days asking about your experience. I hope that your e-visit has been valuable and will speed your recovery. Thank you for using e-visits.

## 2023-05-20 NOTE — Progress Notes (Signed)
I have spent 5 minutes in review of e-visit questionnaire, review and updating patient chart, medical decision making and response to patient.   Mia Milan Cody Jacklynn Dehaas, PA-C    

## 2023-06-02 ENCOUNTER — Other Ambulatory Visit: Payer: Self-pay | Admitting: Family Medicine

## 2023-06-14 ENCOUNTER — Ambulatory Visit: Payer: 59 | Admitting: Internal Medicine

## 2023-06-16 ENCOUNTER — Encounter: Payer: Self-pay | Admitting: Internal Medicine

## 2023-06-16 ENCOUNTER — Ambulatory Visit: Payer: 59 | Admitting: Internal Medicine

## 2023-06-18 ENCOUNTER — Telehealth: Payer: Self-pay | Admitting: Family Medicine

## 2023-06-18 ENCOUNTER — Encounter: Payer: Self-pay | Admitting: Family Medicine

## 2023-06-18 NOTE — Telephone Encounter (Signed)
Copied from CRM [id number]. Topic: Referral - Status >> Jun 18, 2023 12:44 PM Tiffany H wrote: Reason for CRM: Tinnie Gens with [facility] called to confirm whether or not Dr. Lodema Hong received his orders for hospital bed repair sent on 06/12/23 to [phone] fax number.   Did not see order packet in Chart. Please assist. Tinnie Gens called during clinic lunch. Please assist today if possible.  Phone: [phone] Fax: [phone]

## 2023-06-18 NOTE — Telephone Encounter (Signed)
Called to refax the order

## 2023-06-18 NOTE — Telephone Encounter (Signed)
Can they refax the order. We have not received as of yet

## 2023-06-20 ENCOUNTER — Other Ambulatory Visit: Payer: Self-pay | Admitting: Family Medicine

## 2023-06-20 ENCOUNTER — Telehealth: Payer: 59 | Admitting: Nurse Practitioner

## 2023-06-20 DIAGNOSIS — J4541 Moderate persistent asthma with (acute) exacerbation: Secondary | ICD-10-CM

## 2023-06-20 MED ORDER — ALBUTEROL SULFATE (2.5 MG/3ML) 0.083% IN NEBU: 2.5000 mg | INHALATION_SOLUTION | Freq: Four times a day (QID) | RESPIRATORY_TRACT | 1 refills | Status: AC | PRN

## 2023-06-20 MED ORDER — ALBUTEROL SULFATE HFA 108 (90 BASE) MCG/ACT IN AERS: 1.0000 | INHALATION_SPRAY | Freq: Four times a day (QID) | RESPIRATORY_TRACT | 0 refills | Status: DC | PRN

## 2023-06-20 NOTE — Progress Notes (Deleted)
  Cardiology Office Note:  .   Date:  06/20/2023  ID:  Janet Acevedo, DOB 23-Nov-1990, MRN [id number] PCP: Janet Perches, MD  [facility] Providers Cardiologist:  Janet Fus, MD {   Janet Acevedo is a 32 y.o. adult t with a hx of HTN, GERD, SMA type II (spinal muscle atrophy), asthma and  pre-syncope.  ZIO monitor did not show any evidence of rhythm abnormalities.  She was recommended to avoid QTc prolonging medications, and has already been given a handout for oral medications of prolonged QT in the past.  Last seen on 01/06/2022 by Dr. Wyline Acevedo.  Seen on 06/20/2023 for asthma symptoms.  Was prescribed albuterol and use nebulizer treatments via televisit.  ROS: ***  Studies Reviewed: .     Echocardiogram 07/23/2021 1. Left ventricular ejection fraction, by estimation, is 60 to 65%. The  left ventricle has normal function. The left ventricle has no regional  wall motion abnormalities. Left ventricular diastolic parameters were  normal.   2. Right ventricular systolic function is normal. The right ventricular  size is normal. Tricuspid regurgitation signal is inadequate for assessing  PA pressure.   3. The mitral valve is grossly normal. No evidence of mitral valve  regurgitation. No evidence of mitral stenosis.   4. The aortic valve was not well visualized. Aortic valve regurgitation  is not visualized. No aortic stenosis is present.   5. The inferior vena cava is normal in size with greater than 50%  respiratory variability, suggesting right atrial pressure of 3 mmHg.     *** EKG Interpretation Date/Time:    Ventricular Rate:    PR Interval:    QRS Duration:    QT Interval:    QTC Calculation:   R Axis:      Text Interpretation:      Physical Exam:   VS:  There were no vitals taken for this visit.   Wt Readings from Last 3 Encounters:  06/03/21 152 lb (68.9 kg)  02/23/20 152 lb (68.9 kg)  01/22/20 152 lb (68.9 kg)    GEN: Well nourished, well  developed in no acute distress NECK: No JVD; No carotid bruits CARDIAC: ***RRR, no murmurs, rubs, gallops RESPIRATORY:  Clear to auscultation without rales, wheezing or rhonchi  ABDOMEN: Soft, non-tender, non-distended EXTREMITIES:  No edema; No deformity   ASSESSMENT AND PLAN: .   ***    {Are you ordering a CV Procedure (e.g. stress test, cath, DCCV, TEE, etc)?   Press F2        :[id number]}    Signed, Janet Acevedo, ANP, AACC

## 2023-06-20 NOTE — Progress Notes (Signed)
I have spent 5 minutes in review of e-visit questionnaire, review and updating patient chart, medical decision making and response to patient.  ° °Jerrell Mangel W Secilia Apps, NP ° °  °

## 2023-06-20 NOTE — Progress Notes (Signed)
E-Visit for Asthma  Based on what you have shared with me, it looks like you may have a flare up of your asthma.  Asthma is a chronic (ongoing) lung disease which results in airway obstruction, inflammation and hyper-responsiveness.   Asthma symptoms vary from person to person, with common symptoms including nighttime awakening and decreased ability to participate in normal activities as a result of shortness of breath. It is often triggered by changes in weather, changes in the season, changes in air temperature, or inside (home, school, daycare or work) allergens such as animal dander, mold, mildew, woodstoves or cockroaches.   It can also be triggered by hormonal changes, extreme emotion, physical exertion or an upper respiratory tract illness.     It is important to identify the trigger, and then eliminate or avoid the trigger if possible.   If you have been prescribed medications to be taken on a regular basis, it is important to follow the asthma action plan and to follow guidelines to adjust medication in response to increasing symptoms of decreased peak expiratory flow rate  Treatment: I have prescribed: Albuterol (Proventil) (2.5 mg in 3 mL) 0.083 % Take by nebulization solution every six hours as needed for wheezing or shortness of breath as requested. If you are not feeling better in 5 days please let us know or if your symptoms worsen despite using nebulizers I recommend you be seen in person.   HOME CARE Only take medications as instructed by your medical team. Consider wearing a mask or scarf to improve breathing air temperature have been shown to decrease irritation and decrease exacerbations Get rest. Taking a steamy shower or using a humidifier may help nasal congestion sand ease sore throat pain. You can place a towel over your head and breathe in the steam from hot  water coming from a faucet. Using a saline nasal spray works much the same way.  Cough drops, hare candies and sore throat lozenges may ease your cough.  Avoid close contacts especially the very you and the elderly Cover your mouth if you cough or sneeze Always remember to wash your hands.    GET HELP RIGHT AWAY IF: You develop worsening symptoms; breathlessness at rest, drowsy, confused or agitated, unable to speak in full sentences You have coughing fits You develop a severe headache or visual changes You develop shortness of breath, difficulty breathing or start having chest pain Your symptoms persist after you have completed your treatment plan If your symptoms do not improve within 10 days  MAKE SURE YOU Understand these instructions. Will watch your condition. Will get help right away if you are not doing well or get worse.   Your e-visit answers were reviewed by a board certified advanced clinical practitioner to complete your personal care plan, Depending upon the condition, your plan could have included both over the counter or prescription medications.   Please review your pharmacy choice. Your safety is important to [place]. If you have drug allergies check your prescription carefully.  You can use MyChart to ask questions about today's visit, request a non-urgent  call back, or ask for a work or school excuse for 24 hours related to this e-Visit. If it has been greater than 24 hours you will need to follow up with your provider, or enter a new e-Visit to address those concerns.   You will get an e-mail in the next two days asking about your experience. I hope that your e-visit has been valuable and  will speed your recovery. Thank you for using e-visits.

## 2023-06-22 ENCOUNTER — Other Ambulatory Visit: Payer: Self-pay

## 2023-06-22 DIAGNOSIS — E785 Hyperlipidemia, unspecified: Secondary | ICD-10-CM

## 2023-06-22 DIAGNOSIS — E1169 Type 2 diabetes mellitus with other specified complication: Secondary | ICD-10-CM

## 2023-06-22 DIAGNOSIS — I10 Essential (primary) hypertension: Secondary | ICD-10-CM

## 2023-06-23 ENCOUNTER — Ambulatory Visit: Payer: 59 | Admitting: Adult Health

## 2023-06-27 ENCOUNTER — Other Ambulatory Visit (INDEPENDENT_AMBULATORY_CARE_PROVIDER_SITE_OTHER): Payer: Self-pay | Admitting: Gastroenterology

## 2023-06-27 DIAGNOSIS — K219 Gastro-esophageal reflux disease without esophagitis: Secondary | ICD-10-CM

## 2023-06-28 NOTE — Telephone Encounter (Signed)
Last OV 04/27/23

## 2023-06-29 ENCOUNTER — Ambulatory Visit: Payer: 59 | Admitting: Adult Health

## 2023-06-30 ENCOUNTER — Encounter (INDEPENDENT_AMBULATORY_CARE_PROVIDER_SITE_OTHER): Payer: Self-pay

## 2023-07-01 ENCOUNTER — Encounter (INDEPENDENT_AMBULATORY_CARE_PROVIDER_SITE_OTHER): Payer: Self-pay | Admitting: Gastroenterology

## 2023-07-01 ENCOUNTER — Telehealth (INDEPENDENT_AMBULATORY_CARE_PROVIDER_SITE_OTHER): Payer: 59 | Admitting: Gastroenterology

## 2023-07-01 DIAGNOSIS — K219 Gastro-esophageal reflux disease without esophagitis: Secondary | ICD-10-CM | POA: Diagnosis not present

## 2023-07-01 NOTE — Progress Notes (Signed)
Primary Care Physician:  Kerri Perches, MD  Primary GI: Dr. Tasia Catchings  Patient Location: Home Provider Location: [facility] Reason for Visit: follow up of GERD    Persons present on the virtual encounter, with roles: Janet Acevedo L. [name], MSN, APRN, AGNP-C, Janet Acevedo, patient    Total time (minutes) spent on medical discussion:8 minutes  Virtual Visit via MyChart Video Note visit is conducted virtually and was requested by patient.   I connected with Janet Acevedo on 07/01/23 at 11:15 AM EST by telephone and verified that I am speaking with the correct person using two identifiers.   I discussed the limitations, risks, security and privacy concerns of performing an evaluation and management service by telephone and the availability of in person appointments. I also discussed with the patient that there may be a patient responsible charge related to this service. The patient expressed understanding and agreed to proceed.  Chief Complaint  Patient presents with   Gastroesophageal Reflux    Patient doing a video visit. Follow up on GERD. Takes omeprazole 20mg  2 bid due to trouble swallowing 40mg  tablet. She states her symptoms are better now.    History of Present Illness: Janet Acevedo is a 32 years old female with history of spinal muscular dystrophy who is contacted over the phone as follow up visit for long standing GERD , Throat and chest burning   Patient was last seen in October, at that time she reports burning in the middle of her chest and sore throat.  Taking omeprazole 20 mg daily which seems to help some but often takes medication usually after food.  Certain foods such as fatty, spicy, carbonated beverages tend to worsen her symptoms.  Patient was recommended to continue omeprazole 20 mg BID and to switch to taking this prior to eating, as well as addition of Carafate 3 times daily.  Also instructed on good reflux precautions.  She was also given option for  upper endoscopy with Bravo placement given persistent symptoms which patient wished to think about before proceeding.  Present: States she is doing much better since changing omeprazole to before she eats. She is taking this twice daily. Reports she took course of carafate and finished this. Has occasional heartburn at times but this resolves on its own. Is not taking anything extra when this occurs. Usually will improve once she gets up. She does endorse some nausea that is chronic for her, no vomiting, this usually self resolves once she gets up and moves around. She notes some occasional swallowing difficulty secondary to her MD that is chronic for her, she is not waking up at night with any GERD symptoms. Sore throat has resolved, she denies hoarseness, or ongoing cough, She does usually avoid more dairy products as these tend to induce nausea but otherwise is not avoiding spicy foods, caffeine or other GERD inducing products.    Last FAO:1308 EGD [facility] 1. Normal esophegeal mucosa and patent esophageal lumen with no evidence of inflammation, stricture, ring or web. Random esophageal biopsies were performed in the distal and proximal esophagus 2. Normal gastric mucosa with no evidence of ulceration, masses, inflammatory changes or vascular ectasias. Normal appearing antrum. Few polyps in the gastric fundus, biopsies of the largest polyp were performed 3. The duodenal mucosa showed no abnormalities 4. Retroflexion was performed in the stomach and revealed no abnormalities  .  "GASTRIC POLYPS", BIOPSY:      Fundic gland polyps.  No. H. pylori organisms identified on routine H&E.  B.  DISTAL ESOPHAGUS, BIOPSY:      Reactive squamous epithelium with scattered neutrophils and eosinophils (up to 3 eosinophils/high power field).  C.  PROXIMAL ESOPHAGUS, BIOPSY:      Reactive squamous epithelium with scattered neutrophils and eosinophils (up to 2 eosinophils/high power  field). Esophagram No evidence of aspiration or penetration with any tested consistencies. Please refer to the speech pathologist's report for further detail and dietary recommendations.  Last Colonoscopy:None   Past Medical History:  Diagnosis Date   Allergy    Annual visit for general adult medical examination with abnormal findings 07/17/2022   Anxiety    Asthma    Depression, major, single episode, moderate (HCC) 09/05/2017   GERD (gastroesophageal reflux disease)    Hypertension 05/07/2021   Neuromuscular disorder (HCC)    Osteomyelitis (HCC) June 2011   Otitis externa, eczematoid 05/28/2019   Perennial allergic rhinitis    Seborrheic dermatitis of scalp 2006   Spinal muscle atrophy (HCC)    type 2/3 18 months   Type 2 diabetes mellitus with other specified complication (HCC) 02/19/2022     Past Surgical History:  Procedure Laterality Date   bilateral hip reinsertion  under age 49   hips out of socket, pt was only able to cruise hold on and move short distances    BONE BIOPSY     spinal bone    MIRENA PLACED 10/27/10     SPINAL FUSION  1999   rods placed in thoracolumbar spine to excessive scoliosis primarily    SPINE SURGERY N/A    [employer] 07/29/2020     Current Meds  Medication Sig   ACCU-CHEK GUIDE test strip USE AS INSTRUCTED ONCE DAILY TESTING DX E11.9   albuterol (PROVENTIL) (2.5 MG/3ML) 0.083% nebulizer solution Take 3 mLs (2.5 mg total) by nebulization every 6 (six) hours as needed for wheezing or shortness of breath.   albuterol (VENTOLIN HFA) 108 (90 Base) MCG/ACT inhaler Inhale 1-2 puffs into the lungs every 6 (six) hours as needed for wheezing or shortness of breath.   blood glucose meter kit and supplies Dispense based on patient and insurance preference. Once daily testing DX E11.9   busPIRone (BUSPAR) 7.5 MG tablet TAKE 1 TABLET BY MOUTH 2 TIMES DAILY.   clotrimazole-betamethasone (LOTRISONE) cream APPLY TO AFFECTED AREA TWICE A DAY   cromolyn (OPTICROM)  4 % ophthalmic solution PLACE 1 DROP INTO BOTH EYES FOUR TIMES DAILY   Fluocinolone Acetonide Scalp 0.01 % OIL APPLY SPARINGLY TO SCALP TWO TIMES WEEKLY, AS NEEDED, FOR ITCH AND FLAKING   hydrocortisone 2.5 % cream APPLY TO AFFECTED AREA TWICE A DAY   JARDIANCE 10 MG TABS tablet TAKE 1 TABLET BY MOUTH EVERY DAY BEFORE BREAKFAST   ketoconazole (NIZORAL) 2 % shampoo APPLY TO SCALP FOR 5 MINUTES AND RINSE OUT   Lancets 30G MISC Once daily testing dx e11.9   levocetirizine (XYZAL) 5 MG tablet TAKE 1 TABLET BY MOUTH EVERY DAY IN THE EVENING   levonorgestrel (MIRENA) 20 MCG/DAY IUD 1 each by Intrauterine route once.   metoprolol succinate (TOPROL-XL) 25 MG 24 hr tablet TAKE 1 TABLET (25 MG TOTAL) BY MOUTH DAILY.   montelukast (SINGULAIR) 10 MG tablet TAKE 1 TABLET BY MOUTH EVERY DAY   nystatin (MYCOSTATIN/NYSTOP) powder APPLY TO AFFECTED AREA 3 TIMES A DAY   olmesartan (BENICAR) 20 MG tablet TAKE 1 TABLET BY MOUTH EVERY DAY   omeprazole (PRILOSEC) 20 MG capsule TAKE 2  CAPSULES BY MOUTH TWICE DAILY - PATIENT HAS TROUBLE SWALLOWING   Respiratory Therapy Supplies (NEBULIZER/TUBING/MOUTHPIECE) KIT Use as directed. Insurance preference. Nebulizer kit and mask   Risdiplam (EVRYSDI) 0.75 MG/ML SOLR    rosuvastatin (CRESTOR) 5 MG tablet TAKE 1 TABLET (5 MG TOTAL) BY MOUTH DAILY.   sertraline (ZOLOFT) 50 MG tablet TAKE 1 TABLET BY MOUTH EVERY DAY   Spacer/Aero-Holding Chambers (AEROCHAMBER PLUS) inhaler Use as instructed   SYMBICORT 160-4.5 MCG/ACT inhaler INHALE 2 PUFFS INTO THE LUNGS TWICE A DAY   UNABLE TO FIND Gloves- 1 box per month as needed   UNABLE TO FIND Under pads- for use daily as needed   UNABLE TO FIND Incontinence liners- use as needed for incontinence Gloves- 1 box as needed for incontinence DX urinary incontinence   UNABLE TO FIND Nebulizer machine and tubing  DX J45.40     Family History  Problem Relation Age of Onset   Hyperlipidemia Mother    Hypertension Mother    Hypertension  Brother    Diabetes Brother        borderline    Anxiety disorder Brother    Depression Brother    Drug abuse Brother     Social History   Socioeconomic History   Marital status: Single    Spouse name: Not on file   Number of children: Not on file   Years of education: Not on file   Highest education level: Associate degree: academic program  Occupational History   Occupation: student  Tobacco Use   Smoking status: Never   Smokeless tobacco: Never  Vaping Use   Vaping status: Never Used  Substance and Sexual Activity   Alcohol use: No   Drug use: No   Sexual activity: Never  Other Topics Concern   Not on file  Social History Narrative   She is a Electrical engineer a Probation officer in [employer]     Social Determinants of Health   Financial Resource Strain: Medium Risk (11/16/2022)   Overall Financial Resource Strain (CARDIA)    Difficulty of Paying Living Expenses: Somewhat hard  Food Insecurity: No Food Insecurity (11/16/2022)   Hunger Vital Sign    Worried About Running Out of Food in the Last Year: Never true    Ran Out of Food in the Last Year: Never true  Transportation Needs: No Transportation Needs (11/16/2022)   PRAPARE - Administrator, Civil Service (Medical): No    Lack of Transportation (Non-Medical): No  Physical Activity: Unknown (11/16/2022)   Exercise Vital Sign    Days of Exercise per Week: 0 days    Minutes of Exercise per Session: Not on file  Stress: No Stress Concern Present (11/16/2022)   Janet Acevedo [employer] Occupational Health - Occupational Stress Questionnaire    Feeling of Stress : Not at all  Social Connections: Socially Isolated (11/16/2022)   Social Connection and Isolation Panel [NHANES]    Frequency of Communication with Friends and Family: More than three times a week    Frequency of Social Gatherings with Friends and Family: More than three times a week    Attends Religious Services: Never    Database administrator or  Organizations: No    Attends Engineer, structural: Not on file    Marital Status: Never married   Review of Systems: Gen: Denies fever, chills, anorexia. Denies fatigue, weakness, weight loss.  CV: Denies chest pain, palpitations, syncope, peripheral edema, and claudication. Resp: Denies dyspnea at rest,  cough, wheezing, coughing up blood, and pleurisy. GI: see HPI Derm: Denies rash, itching, dry skin Psych: Denies depression, anxiety, memory loss, confusion. No homicidal or suicidal ideation.  Heme: Denies bruising, bleeding, and enlarged lymph nodes.  Observations/Objective: No distress. Unable to perform physical exam due to telephone encounter. No video available.   Assessment and Plan: Janet Acevedo is a 32 year old female with longstanding history of GERD who presented today for follow-up of GERD flare.  Patient reports improvement in her GERD symptoms since she changed timing of her omeprazole doses.  She did complete Carafate course which also seem to help.  No longer having a sore throat, has some occasional heartburn though she also reports she is not avoiding any GERD inducing foods/drinks and that consist heavily of caffeinated drinks.  We did discuss potential EGD with Bravo placement as recommended by Dr. Tasia Catchings at her last visit however at this time she would like to hold off on this which I think is reasonable given her symptoms are improved however if symptoms were to worsen or again become refractory to PPI therapy would recommend proceeding with EGD with Bravo placement.    Recommendations:  -continue with omeprazole 20 mg twice daily, taking this 30 to 60 minutes before meals.   -Discussed importance of maintaining good reflux precautions to include avoiding greasy, spicy, fried, citrus/tomato-based foods, caffeine, chocolate, alcohol as these can worsen reflux.  She should avoid eating late at night and stay upright 2 to 3 hours after eating prior to laying down,  can also elevate head of bed at night as well. -EGD with Bravo placement if GERD symptoms worsen or become refractory to PPI therapy  Follow Up Instructions: 3 months   I discussed the assessment and treatment plan with the patient. The patient was provided an opportunity to ask questions and all were answered. The patient agreed with the plan and demonstrated an understanding of the instructions.   The patient was advised to call back or seek an in-person evaluation if the symptoms worsen or if the condition fails to improve as anticipated.  I provided 8 minutes of face-to-face time during this MyChart Video encounter.  Thurmon Mizell L. [name], MSN, APRN, AGNP-C Adult-Gerontology Nurse Practitioner [facility]

## 2023-07-01 NOTE — Patient Instructions (Signed)
-  continue with omeprazole 20 mg twice daily, taking this 30 to 60 minutes before meals.   -as we discussed, it is important to be mindful of greasy, spicy, fried, citrus/tomato-based foods, caffeine, chocolate, alcohol as these can worsen reflux.   -should avoid eating late at night and stay upright 2 to 3 hours after eating prior to laying down, can also elevate head of bed at night as well. -if GERD symptoms worsen again, would recommend EGD with possible BRAVO placement for further evaluation   Follow up 4 months  It was a pleasure to see you today. I want to create trusting relationships with patients and provide genuine, compassionate, and quality care. I truly value your feedback! please be on the lookout for a survey regarding your visit with me today. I appreciate your input about our visit and your time in completing this!    Elnor Renovato L. [name], MSN, APRN, AGNP-C Adult-Gerontology Nurse Practitioner [facility]

## 2023-07-06 DIAGNOSIS — E1169 Type 2 diabetes mellitus with other specified complication: Secondary | ICD-10-CM | POA: Diagnosis not present

## 2023-07-06 DIAGNOSIS — N189 Chronic kidney disease, unspecified: Secondary | ICD-10-CM | POA: Diagnosis not present

## 2023-07-06 DIAGNOSIS — R7989 Other specified abnormal findings of blood chemistry: Secondary | ICD-10-CM | POA: Diagnosis not present

## 2023-07-06 DIAGNOSIS — I10 Essential (primary) hypertension: Secondary | ICD-10-CM | POA: Diagnosis not present

## 2023-07-06 DIAGNOSIS — D72829 Elevated white blood cell count, unspecified: Secondary | ICD-10-CM | POA: Diagnosis not present

## 2023-07-08 LAB — LAB REPORT - SCANNED: EGFR: 85

## 2023-07-12 ENCOUNTER — Encounter: Payer: Self-pay | Admitting: Internal Medicine

## 2023-07-13 ENCOUNTER — Ambulatory Visit: Payer: 59 | Admitting: Internal Medicine

## 2023-07-15 ENCOUNTER — Telehealth: Payer: 59 | Admitting: Physician Assistant

## 2023-07-15 DIAGNOSIS — J358 Other chronic diseases of tonsils and adenoids: Secondary | ICD-10-CM | POA: Diagnosis not present

## 2023-07-15 NOTE — Progress Notes (Signed)
E-Visit for Sore Throat  We are sorry that you are not feeling well.  Here is how we plan to help!  Your symptoms indicate a likely viral infection (Pharyngitis).   Pharyngitis is inflammation in the back of the throat which can cause a sore throat, scratchiness and sometimes difficulty swallowing.   Pharyngitis is typically caused by a respiratory virus and will just run its course.  Please keep in mind that your symptoms could last up to 10 days.  For throat pain, we recommend over the counter oral pain relief medications such as acetaminophen or aspirin, or anti-inflammatory medications such as ibuprofen or naproxen sodium.  Topical treatments such as oral throat lozenges or sprays may be used as needed.  Avoid close contact with loved ones, especially the very young and elderly.  Remember to wash your hands thoroughly throughout the day as this is the number one way to prevent the spread of infection and wipe down door knobs and counters with disinfectant.  After careful review of your answers, I would not recommend an antibiotic for your condition.  Antibiotics should not be used to treat conditions that we suspect are caused by viruses like the virus that causes the common cold or flu. However, some people can have Strep with atypical symptoms. You may need formal testing in clinic or office to confirm if your symptoms continue or worsen.  Providers prescribe antibiotics to treat infections caused by bacteria. Antibiotics are very powerful in treating bacterial infections when they are used properly.  To maintain their effectiveness, they should be used only when necessary.  Overuse of antibiotics has resulted in the development of super bugs that are resistant to treatment!    Home Care: Only take medications as instructed by your medical team. Do not drink alcohol while taking these medications. A steam or ultrasonic humidifier can help congestion.  You can place a towel over your head and  breathe in the steam from hot water coming from a faucet. Avoid close contacts especially the very young and the elderly. Cover your mouth when you cough or sneeze. Always remember to wash your hands. Gargle with warm salt water. Eat sour candy to salivation and dislodge the stone if any.  Take over the counter Ibuprofen for swelling and pain.   Get Help Right Away If: You develop worsening fever or throat pain. You develop a severe head ache or visual changes. Your symptoms persist after you have completed your treatment plan.  Make sure you Understand these instructions. Will watch your condition. Will get help right away if you are not doing well or get worse.   Thank you for choosing an e-visit.  Your e-visit answers were reviewed by a board certified advanced clinical practitioner to complete your personal care plan. Depending upon the condition, your plan could have included both over the counter or prescription medications.  Please review your pharmacy choice. Make sure the pharmacy is open so you can pick up prescription now. If there is a problem, you may contact your provider through [employer] and have the prescription routed to another pharmacy.  Your safety is important to [place]. If you have drug allergies check your prescription carefully.   For the next 24 hours you can use MyChart to ask questions about today's visit, request a non-urgent call back, or ask for a work or school excuse. You will get an email in the next two days asking about your experience. I hope that your e-visit has been  valuable and will speed your recovery.   I have spent 5 minutes in review of e-visit questionnaire, review and updating patient chart, medical decision making and response to patient.   Gilberto Better, PA-C

## 2023-07-17 ENCOUNTER — Encounter: Payer: Self-pay | Admitting: Family Medicine

## 2023-07-19 ENCOUNTER — Other Ambulatory Visit: Payer: Self-pay | Admitting: Family Medicine

## 2023-07-19 NOTE — Progress Notes (Signed)
No ant inflammatories prescribed

## 2023-07-22 ENCOUNTER — Ambulatory Visit: Payer: 59 | Admitting: Family Medicine

## 2023-07-23 ENCOUNTER — Telehealth (INDEPENDENT_AMBULATORY_CARE_PROVIDER_SITE_OTHER): Payer: 59 | Admitting: Family Medicine

## 2023-07-23 VITALS — BP 124/92 | HR 72

## 2023-07-23 DIAGNOSIS — E1169 Type 2 diabetes mellitus with other specified complication: Secondary | ICD-10-CM

## 2023-07-23 DIAGNOSIS — G121 Other inherited spinal muscular atrophy: Secondary | ICD-10-CM

## 2023-07-23 DIAGNOSIS — I10 Essential (primary) hypertension: Secondary | ICD-10-CM | POA: Diagnosis not present

## 2023-07-23 DIAGNOSIS — E785 Hyperlipidemia, unspecified: Secondary | ICD-10-CM | POA: Diagnosis not present

## 2023-07-23 DIAGNOSIS — L219 Seborrheic dermatitis, unspecified: Secondary | ICD-10-CM | POA: Diagnosis not present

## 2023-07-23 DIAGNOSIS — D72829 Elevated white blood cell count, unspecified: Secondary | ICD-10-CM | POA: Diagnosis not present

## 2023-07-23 DIAGNOSIS — J4540 Moderate persistent asthma, uncomplicated: Secondary | ICD-10-CM

## 2023-07-23 DIAGNOSIS — F411 Generalized anxiety disorder: Secondary | ICD-10-CM

## 2023-07-23 MED ORDER — CLOTRIMAZOLE-BETAMETHASONE 1-0.05 % EX CREA: TOPICAL_CREAM | Freq: Two times a day (BID) | CUTANEOUS | 1 refills | Status: AC

## 2023-07-23 NOTE — Patient Instructions (Addendum)
Annual exam in office in 3 to  4 months, call if you need me sooner  Nurse pls send for eye exam [facility] in [place] done 08/2022  Fasting lipid, hepatic panel and hBA1C  as soon as possible  Please schedule wellness visit , past due     Pls get flu, covid and TdAP vaccines as soon as possible space them out, they are past due  Good that you are doing well overall

## 2023-07-25 ENCOUNTER — Encounter: Payer: Self-pay | Admitting: Family Medicine

## 2023-07-25 NOTE — Assessment & Plan Note (Signed)
Hyperlipidemia:Low fat diet discussed and encouraged.   Lipid Panel  Lab Results  Component Value Date   CHOL 134 02/26/2023   HDL 34 (L) 02/26/2023   LDLCALC 84 02/26/2023   TRIG 83 02/26/2023   CHOLHDL 3.9 02/26/2023     Updated lab needed at/ before next visit.

## 2023-07-25 NOTE — Assessment & Plan Note (Addendum)
Diabetes associated with hypertension, obesity, neurologic disease, dylipidemia Updated lab needed at/ before next visit.         Latest Ref Rng & Units 05/10/2023   11:00 AM 02/26/2023    1:49 PM 07/15/2022    3:02 PM 02/13/2022    1:56 PM 09/23/2021    3:46 PM  Diabetic Labs  HbA1c 4.8 - 5.6 %  6.0  6.3  7.5    Micro/Creat Ratio 0 - 29 mg/g creat 36       Chol 100 - 199 mg/dL  161  096  045  409   HDL >39 mg/dL  34  34  30  37   Calc LDL 0 - 99 mg/dL  84  80  64  80   Triglycerides 0 - 149 mg/dL  83  74  75  90   Creatinine 0.57 - 1.00 mg/dL  8.11  9.14  7.82  9.56       07/23/2023    2:31 PM 05/10/2023   11:15 AM 03/10/2023    3:57 PM 07/15/2022    2:39 PM 07/15/2022    2:09 PM 01/06/2022    4:16 PM 09/23/2021    2:17 PM  BP/Weight  Systolic BP 124 135 111 120 119 128 125  Diastolic BP 92 83 84 80 85 88 84       No data to display

## 2023-07-25 NOTE — Assessment & Plan Note (Signed)
Has been evaluated by Hematology, has f/u no significant pathology

## 2023-07-25 NOTE — Assessment & Plan Note (Signed)
Controlled, no change in medication  

## 2023-07-25 NOTE — Assessment & Plan Note (Signed)
Stale , has had recent Neurology televisit maintained on evrysdi, follows twice yearly

## 2023-07-25 NOTE — Progress Notes (Signed)
Virtual Visit via Video Note  I connected with Janet Acevedo on 07/25/23 at  2:20 PM EST by a video enabled telemedicine application and verified that I am speaking with the correct person using two identifiers.  Location: Patient: home Provider: office   I discussed the limitations of evaluation and management by telemedicine and the availability of in person appointments. The patient expressed understanding and agreed to proceed.  History of Present Illness: F/u chronic problems States doing well currently , pain with tonsil stones which was her most recent complaint , has resoled ANxiety is increased only because of recurrent hospitalization of her paraplegic Aunt Has had respiratory illness intermittently and has not been able to get fly vaccine as yet but intends to do so House bound most of the time and satidsfied with current living circumstances Follows with Cardiology, GI, Pulmonary, Nephrology, Hematology    Observations/Objective: BP (!) 124/92   Pulse 72  Good communication with no confusion and intact memory. Alert and oriented x 3 No signs of respiratory distress during speech   Assessment and Plan:  Type II spinal muscular atrophy (HCC) Stale , has had recent Neurology televisit maintained on evrysdi, follows twice yearly  Moderate persistent asthma Controlled on current meds , followed by Pulmonary in [place]  Hypertension Sub optimal control reported ,no med change, will see Cardiology in next 3 monhts, would prefer in office value. Low sodium and weight loss will lower BP  Type 2 diabetes mellitus with other specified complication (HCC) Diabetes associated with hypertension, obesity, neurologic disease, dylipidemia Updated lab needed at/ before next visit.         Latest Ref Rng & Units 05/10/2023   11:00 AM 02/26/2023    1:49 PM 07/15/2022    3:02 PM 02/13/2022    1:56 PM 09/23/2021    3:46 PM  Diabetic Labs  HbA1c 4.8 - 5.6 %  6.0  6.3  7.5     Micro/Creat Ratio 0 - 29 mg/g creat 36       Chol 100 - 199 mg/dL  191  478  295  621   HDL >39 mg/dL  34  34  30  37   Calc LDL 0 - 99 mg/dL  84  80  64  80   Triglycerides 0 - 149 mg/dL  83  74  75  90   Creatinine 0.57 - 1.00 mg/dL  3.08  6.57  8.46  9.62       07/23/2023    2:31 PM 05/10/2023   11:15 AM 03/10/2023    3:57 PM 07/15/2022    2:39 PM 07/15/2022    2:09 PM 01/06/2022    4:16 PM 09/23/2021    2:17 PM  BP/Weight  Systolic BP 124 135 111 120 119 128 125  Diastolic BP 92 83 84 80 85 88 84       No data to display              Leukocytosis Has been evaluated by Hematology, has f/u no significant pathology  GAD (generalized anxiety disorder) Controlled, no change in medication   Dyslipidemia Hyperlipidemia:Low fat diet discussed and encouraged.   Lipid Panel  Lab Results  Component Value Date   CHOL 134 02/26/2023   HDL 34 (L) 02/26/2023   LDLCALC 84 02/26/2023   TRIG 83 02/26/2023   CHOLHDL 3.9 02/26/2023     Updated lab needed at/ before next visit.   Seborrheic dermatitis of scalp Controlled, no change  in medication   Follow Up Instructions:    I discussed the assessment and treatment plan with the patient. The patient was provided an opportunity to ask questions and all were answered. The patient agreed with the plan and demonstrated an understanding of the instructions.   The patient was advised to call back or seek an in-person evaluation if the symptoms worsen or if the condition fails to improve as anticipated.  I provided 24 minutes of non-face-to-face time during this encounter.   Syliva Overman, MD

## 2023-07-25 NOTE — Assessment & Plan Note (Signed)
Sub optimal control reported ,no med change, will see Cardiology in next 3 monhts, would prefer in office value. Low sodium and weight loss will lower BP

## 2023-07-25 NOTE — Assessment & Plan Note (Addendum)
Controlled on current meds , followed by Pulmonary in [facility]

## 2023-08-10 ENCOUNTER — Encounter: Payer: Self-pay | Admitting: Internal Medicine

## 2023-08-12 ENCOUNTER — Ambulatory Visit: Payer: 59 | Attending: Internal Medicine | Admitting: Internal Medicine

## 2023-08-12 VITALS — BP 132/84 | HR 81 | Ht 67.0 in

## 2023-08-12 DIAGNOSIS — I10 Essential (primary) hypertension: Secondary | ICD-10-CM

## 2023-08-12 NOTE — Patient Instructions (Signed)
Medication Instructions:  No chnages *If you need a refill on your cardiac medications before your next appointment, please call your pharmacy*   Lab Work: None  If you have labs (blood work) drawn today and your tests are completely normal, you will receive your results only by: MyChart Message (if you have MyChart) OR A paper copy in the mail If you have any lab test that is abnormal or we need to change your treatment, we will call you to review the results.   Testing/Procedures: None    Follow-Up: At [facility], you and your health needs are our priority.  As part of our continuing mission to provide you with exceptional heart care, we have created designated Provider Care Teams.  These Care Teams include your primary Cardiologist (physician) and Advanced Practice Providers (APPs -  Physician Assistants and Nurse Practitioners) who all work together to provide you with the care you need, when you need it.   Your next appointment:   1 year(s)  Provider:   Marjie Skiff, PA-C        Other Instructions

## 2023-08-12 NOTE — Progress Notes (Signed)
Cardiology Office Note:    Date:  08/12/2023   ID:  Janet Acevedo, DOB [birth year], MRN [id number]  PCP:  Kerri Perches, MD   [facility] Providers Cardiologist:  Maisie Fus, MD     Referring MD: Kerri Perches, MD   No chief complaint on file. Pre-Syncope  History of Present Illness:    Janet Acevedo is a 33 y.o. adult with a hx of HTN, GERD, SMA type II who presented to the ED with pre-syncope  She went to urgent care with these symptoms and it was suggested this may be due to an electrolyte disturbance. She was told to go to the ED. EKG showed sinus tachycardia HR 100 bpm, Qtc 495. A tropoinin was 2. She feels elevated heart rate. She was recently treated with prednisone. On Sunday afternoon she fainted. She was using the restroom when it happened. She started to feel strange and groggy and weak. Her pallor became white. She felt tingling and hotness and she went limp. Paramedics came 110/70 mmHg. Her vitals were normal. She's felt tired and weak. Her hands and feet feel cold and nausea. Her appetite is down. Feeling not like herself. She feels brain fog. No prior syncopal episode. Notes some feeling of fast heart rates. She started new BP meds olmasartan. She started crestor on the 12th of October. She has family hx of HLD and HTN. She started Evrysdi.for SMA on October 21st managed by neurology at [facility].  No notable cardiotoxicity.No family hx of arrhythmia. No family hx of heart disease. Great grandmother had congestive heart failure and her great aunt. No chest pain, dyspnea, no LE edema. No orthopnea or PND.    EKG 05/12/2021 sinus tachycardia, Qtc 495 ms EKG 01/13/2017 sinus rhythm, Qtc 492 ms  Interim Hx 01/06/2022: Janet Acevedo is doing well. She's hoping to get outside more for the summer. She denies recurrent  syncopal events.  No chest pain or sob.   02/26/2021 LDL 142  TG 93 TC 197 Crt 0.1 K 3.9 TSH 1.3 A1c 5.0%   Interim hx 08/12/2023 No recent  episodes of syncope. She denies any cardiac symptoms.  She wanted to make sure that she did not have any structural issues.         Cardiac Studies TTE 07/23/2021 - Normal  Ziopatch- 4 beat run of NSVT   Current Medications: Current Outpatient Medications on File Prior to Visit  Medication Sig Dispense Refill   ACCU-CHEK GUIDE test strip USE AS INSTRUCTED ONCE DAILY TESTING DX E11.9 100 strip 5   albuterol (PROVENTIL) (2.5 MG/3ML) 0.083% nebulizer solution Take 3 mLs (2.5 mg total) by nebulization every 6 (six) hours as needed for wheezing or shortness of breath. 150 mL 1   albuterol (VENTOLIN HFA) 108 (90 Base) MCG/ACT inhaler Inhale 1-2 puffs into the lungs every 6 (six) hours as needed for wheezing or shortness of breath. 17 g 0   blood glucose meter kit and supplies Dispense based on patient and insurance preference. Once daily testing DX E11.9 1 each 0   busPIRone (BUSPAR) 7.5 MG tablet TAKE 1 TABLET BY MOUTH 2 TIMES DAILY. 180 tablet 2   clotrimazole-betamethasone (LOTRISONE) cream Apply topically 2 (two) times daily. 45 g 1   cromolyn (OPTICROM) 4 % ophthalmic solution PLACE 1 DROP INTO BOTH EYES FOUR TIMES DAILY 40 mL 8   Fluocinolone Acetonide Scalp 0.01 % OIL APPLY SPARINGLY TO SCALP TWO TIMES WEEKLY, AS NEEDED, FOR ITCH AND FLAKING 118.28 mL 3  hydrocortisone 2.5 % cream APPLY TO AFFECTED AREA TWICE A DAY 28 g 1   JARDIANCE 10 MG TABS tablet TAKE 1 TABLET BY MOUTH EVERY DAY BEFORE BREAKFAST 90 tablet 2   ketoconazole (NIZORAL) 2 % shampoo APPLY TO SCALP FOR 5 MINUTES AND RINSE OUT 120 mL 4   Lancets 30G MISC Once daily testing dx e11.9 100 each 5   levocetirizine (XYZAL) 5 MG tablet TAKE 1 TABLET BY MOUTH EVERY DAY IN THE EVENING 90 tablet 3   levonorgestrel (MIRENA) 20 MCG/DAY IUD 1 each by Intrauterine route once.     metoprolol succinate (TOPROL-XL) 25 MG 24 hr tablet TAKE 1 TABLET (25 MG TOTAL) BY MOUTH DAILY. 90 tablet 1   montelukast (SINGULAIR) 10 MG tablet TAKE 1  TABLET BY MOUTH EVERY DAY 90 tablet 3   nystatin (MYCOSTATIN/NYSTOP) powder APPLY TO AFFECTED AREA 3 TIMES A DAY 60 g 1   olmesartan (BENICAR) 20 MG tablet TAKE 1 TABLET BY MOUTH EVERY DAY 90 tablet 1   omeprazole (PRILOSEC) 20 MG capsule TAKE 2 CAPSULES BY MOUTH TWICE DAILY - PATIENT HAS TROUBLE SWALLOWING 360 capsule 3   Respiratory Therapy Supplies (NEBULIZER/TUBING/MOUTHPIECE) KIT Use as directed. Insurance preference. Nebulizer kit and mask 1 kit 0   Risdiplam (EVRYSDI) 0.75 MG/ML SOLR      rosuvastatin (CRESTOR) 5 MG tablet TAKE 1 TABLET (5 MG TOTAL) BY MOUTH DAILY. 90 tablet 3   sertraline (ZOLOFT) 50 MG tablet TAKE 1 TABLET BY MOUTH EVERY DAY 90 tablet 2   Spacer/Aero-Holding Chambers (AEROCHAMBER PLUS) inhaler Use as instructed 1 each 2   sucralfate (CARAFATE) 1 g tablet TAKE 1 TABLET (1 G TOTAL) BY MOUTH 4 TIMES A DAY WITH MEALS AND AT BEDTIME 120 tablet 1   SYMBICORT 160-4.5 MCG/ACT inhaler INHALE 2 PUFFS INTO THE LUNGS TWICE A DAY 10.2 each 6   UNABLE TO FIND Gloves- 1 box per month as needed 200 each prn   UNABLE TO FIND Under pads- for use daily as needed 100 each prn   UNABLE TO FIND Incontinence liners- use as needed for incontinence Gloves- 1 box as needed for incontinence DX urinary incontinence 1 each 0   UNABLE TO FIND Nebulizer machine and tubing  DX J45.40 1 each 0   [DISCONTINUED] Calcium Carbonate (CALCIUM 500 PO) Take 1 tablet by mouth daily.      No current facility-administered medications on file prior to visit.     Allergies:   Other and Ciprofloxacin   Family History: The patient's family history includes Anxiety disorder in Ziasia's brother; Depression in Zerina's brother; Diabetes in Kindel's brother; Drug abuse in Jane's brother; Hyperlipidemia in Bekka's mother; Hypertension in Katria's brother and mother.  ROS:   Please see the history of present illness.     All other systems reviewed and are negative.  EKGs/Labs/Other Studies Reviewed:    The  following studies were reviewed today:   EKG:   Per above  Recent Labs: 02/26/2023: ALT 14; BUN 9; Creatinine, Ser 0.19; Potassium 4.5; Sodium 143; TSH 2.720 05/10/2023: Hemoglobin 12.9; Platelets 227   Recent Lipid Panel    Component Value Date/Time   CHOL 134 02/26/2023 1349   TRIG 83 02/26/2023 1349   HDL 34 (L) 02/26/2023 1349   CHOLHDL 3.9 02/26/2023 1349   CHOLHDL 4.6 10/06/2018 1353   VLDL 12 02/08/2017 1450   LDLCALC 84 02/26/2023 1349   LDLCALC 140 (H) 10/06/2018 1353     Risk Assessment/Calculations:  Physical Exam:    VS Vitals:   08/12/23 1359  BP: 132/84  Pulse: 81  SpO2: 97%     Wt Readings from Last 3 Encounters:  06/03/21 152 lb (68.9 kg)  02/23/20 152 lb (68.9 kg)  01/22/20 152 lb (68.9 kg)     GEN:  Well nourished, well developed in no acute distress. In a motorized wheel chair HEENT: Normal NECK: No JVD;  LYMPHATICS: No lymphadenopathy CARDIAC: RRR, no murmurs, rubs, gallops RESPIRATORY:  Normal wob, Clear to auscultation  ABDOMEN: Soft, non-tender, non-distended MUSCULOSKELETAL:  No edema; No deformity  SKIN: Warm and dry NEUROLOGIC:  Alert and oriented x 3 PSYCHIATRIC:  Normal affect   ASSESSMENT:    # Vasovagal syncope her symptoms were c/w vasovagal syncope. However considering hx of SMA and prolonged Qtc, obtained a cardiac event monitor and TTE. Arrhythmia and cardiomyopathy risk is increased in patients with SMA. Her zio did not show any concerning rhythm abnormalities. She has no structural heart disease on TTE. Recommend Avoiding QTc prolonging medications and provided a hand out for oral medications that prolong the QT in the past.    PLAN:    In order of problems listed above:  No changes today Follow up in one year with an APP   Medication Adjustments/Labs and Tests Ordered: Current medicines are reviewed at length with the patient today.  Concerns regarding medicines are outlined above.   Signed, Maisie Fus, MD  08/12/2023 12:04 PM    [facility]

## 2023-08-19 ENCOUNTER — Encounter: Payer: Self-pay | Admitting: Family Medicine

## 2023-08-20 DIAGNOSIS — E1129 Type 2 diabetes mellitus with other diabetic kidney complication: Secondary | ICD-10-CM | POA: Diagnosis not present

## 2023-08-20 DIAGNOSIS — E559 Vitamin D deficiency, unspecified: Secondary | ICD-10-CM | POA: Diagnosis not present

## 2023-08-20 DIAGNOSIS — N182 Chronic kidney disease, stage 2 (mild): Secondary | ICD-10-CM | POA: Diagnosis not present

## 2023-08-20 DIAGNOSIS — R809 Proteinuria, unspecified: Secondary | ICD-10-CM | POA: Diagnosis not present

## 2023-08-24 ENCOUNTER — Other Ambulatory Visit: Payer: Self-pay | Admitting: Family Medicine

## 2023-08-30 ENCOUNTER — Encounter: Payer: Self-pay | Admitting: Family Medicine

## 2023-09-02 ENCOUNTER — Telehealth: Payer: 59 | Admitting: Emergency Medicine

## 2023-09-02 DIAGNOSIS — R6889 Other general symptoms and signs: Secondary | ICD-10-CM

## 2023-09-03 ENCOUNTER — Encounter: Payer: Self-pay | Admitting: Family Medicine

## 2023-09-03 NOTE — Progress Notes (Signed)
   Thank you for the details you included in the comment boxes. Those details are very helpful in determining the best course of treatment for you and help us  to provide the best care.Because you stated that you were in close contact with someone who has Covid, we recommend that you schedule a Virtual Urgent Care video visit in order for the provider to better assess what is going on.  The provider will be able to give you a more accurate diagnosis and treatment plan if we can more freely discuss your symptoms and with the addition of a virtual examination.   If you change your visit to a video visit, we will bill your insurance (similar to an office visit) and you will not be charged for this e-Visit. You will be able to stay at home and speak with the first available [facility] advanced practice provider. The link to do a video visit is in the drop down Menu tab of your Welcome screen in MyChart.

## 2023-09-04 ENCOUNTER — Telehealth: Payer: 59 | Admitting: Nurse Practitioner

## 2023-09-04 DIAGNOSIS — R6889 Other general symptoms and signs: Secondary | ICD-10-CM

## 2023-09-04 MED ORDER — OSELTAMIVIR PHOSPHATE 75 MG PO CAPS: 75.0000 mg | ORAL_CAPSULE | Freq: Two times a day (BID) | ORAL | 0 refills | Status: AC

## 2023-09-04 NOTE — Progress Notes (Signed)
 Virtual Visit Consent   Janet Acevedo, you are scheduled for a virtual visit with a [facility] provider today. Just as with appointments in the office, your consent must be obtained to participate. Your consent will be active for this visit and any virtual visit you may have with one of our providers in the next 365 days. If you have a MyChart account, a copy of this consent can be sent to you electronically.  As this is a virtual visit, video technology does not allow for your provider to perform a traditional examination. This may limit your provider's ability to fully assess your condition. If your provider identifies any concerns that need to be evaluated in person or the need to arrange testing (such as labs, EKG, etc.), we will make arrangements to do so. Although advances in technology are sophisticated, we cannot ensure that it will always work on either your end or our end. If the connection with a video visit is poor, the visit may have to be switched to a telephone visit. With either a video or telephone visit, we are not always able to ensure that we have a secure connection.  By engaging in this virtual visit, you consent to the provision of healthcare and authorize for your insurance to be billed (if applicable) for the services provided during this visit. Depending on your insurance coverage, you may receive a charge related to this service.  I need to obtain your verbal consent now. Are you willing to proceed with your visit today? Janet Acevedo has provided verbal consent on 09/04/2023 for a virtual visit (video or telephone). Haze LELON Servant, NP  Date: 09/04/2023 8:56 AM  Virtual Visit via Video Note   I, Haze LELON Servant, connected with  Janet Acevedo  ([id number], Nov 07, 1990) on 09/04/23 at  9:00 AM EST by a video-enabled telemedicine application and verified that I am speaking with the correct person using two identifiers.  Location: Patient: Virtual Visit Location Patient:  Home Provider: Virtual Visit Location Provider: Home Office   I discussed the limitations of evaluation and management by telemedicine and the availability of in person appointments. The patient expressed understanding and agreed to proceed.    History of Present Illness: Janet Acevedo is a 33 y.o. who identifies as a nonbinary who was assigned adult at birth, and is being seen today for flu  like symptoms.  Ethelle has been experiencing symptoms of body aches, cough, headaches, congestion, sore throat, sinus pressure, chills and diaphoresis. Admits to possible exposure to both FLU and COVID. Onset of symptoms was 1 day ago. Currently taking tylenol  and prescription cough syrup.  Took a home covid and flu test and both were negative.     Problems:  Patient Active Problem List   Diagnosis Date Noted   Elevated serum creatinine 03/15/2023   Leukocytosis 03/15/2023   Decubital ulcer 02/16/2023   Annual visit for general adult medical examination with abnormal findings 07/17/2022   Urinary frequency 03/04/2022   Type 2 diabetes mellitus with other specified complication (HCC) 02/19/2022   Muscle cramps 11/06/2021   Palpitations 05/18/2021   Audible heartbeat in ear 05/18/2021   Hypertension 05/07/2021   Vitamin D  deficiency 03/03/2021   Neck swelling 02/23/2020   Wheelchair dependent 01/03/2020   Depression, major, single episode, moderate (HCC) 09/05/2017   Dysphagia 08/24/2017   GAD (generalized anxiety disorder) 02/08/2017   Moderate persistent asthma 01/14/2017   H/O spinal fusion 01/14/2017   Osteopenia determined by x-ray  01/14/2017   Dyslipidemia 09/28/2016   Seborrheic dermatitis of scalp 02/07/2015   Urinary incontinence due to severe physical disability 02/12/2014   Dermatomycosis 06/29/2010   GERD 06/29/2010   Type II spinal muscular atrophy (HCC) 02/24/2010   Seasonal allergies 02/24/2010    Allergies:  Allergies  Allergen Reactions   Other Diarrhea    Ciprofloxacin  Nausea And Vomiting   Medications:  Current Outpatient Medications:    oseltamivir  (TAMIFLU ) 75 MG capsule, Take 1 capsule (75 mg total) by mouth 2 (two) times daily for 5 days., Disp: 10 capsule, Rfl: 0   ACCU-CHEK GUIDE test strip, USE AS INSTRUCTED ONCE DAILY TESTING DX E11.9, Disp: 100 strip, Rfl: 5   albuterol  (PROVENTIL ) (2.5 MG/3ML) 0.083% nebulizer solution, Take 3 mLs (2.5 mg total) by nebulization every 6 (six) hours as needed for wheezing or shortness of breath., Disp: 150 mL, Rfl: 1   albuterol  (VENTOLIN  HFA) 108 (90 Base) MCG/ACT inhaler, Inhale 1-2 puffs into the lungs every 6 (six) hours as needed for wheezing or shortness of breath., Disp: 17 g, Rfl: 0   blood glucose meter kit and supplies, Dispense based on patient and insurance preference. Once daily testing DX E11.9, Disp: 1 each, Rfl: 0   busPIRone  (BUSPAR ) 7.5 MG tablet, TAKE 1 TABLET BY MOUTH 2 TIMES DAILY., Disp: 180 tablet, Rfl: 2   clotrimazole -betamethasone  (LOTRISONE ) cream, Apply topically 2 (two) times daily., Disp: 45 g, Rfl: 1   cromolyn  (OPTICROM ) 4 % ophthalmic solution, PLACE 1 DROP INTO BOTH EYES FOUR TIMES DAILY, Disp: 40 mL, Rfl: 8   Fluocinolone  Acetonide Scalp 0.01 % OIL, APPLY SPARINGLY TO SCALP TWO TIMES WEEKLY, AS NEEDED, FOR ITCH AND FLAKING, Disp: 118.28 mL, Rfl: 3   hydrocortisone  2.5 % cream, APPLY TO AFFECTED AREA TWICE A DAY, Disp: 28 g, Rfl: 1   JARDIANCE  10 MG TABS tablet, TAKE 1 TABLET BY MOUTH EVERY DAY BEFORE BREAKFAST, Disp: 90 tablet, Rfl: 2   ketoconazole  (NIZORAL ) 2 % shampoo, APPLY TO SCALP FOR 5 MINUTES AND RINSE OUT, Disp: 120 mL, Rfl: 4   Lancets 30G MISC, Once daily testing dx e11.9, Disp: 100 each, Rfl: 5   levocetirizine (XYZAL ) 5 MG tablet, TAKE 1 TABLET BY MOUTH EVERY DAY IN THE EVENING, Disp: 90 tablet, Rfl: 3   levonorgestrel (MIRENA) 20 MCG/DAY IUD, 1 each by Intrauterine route once., Disp: , Rfl:    metoprolol  succinate (TOPROL -XL) 25 MG 24 hr tablet, TAKE 1  TABLET (25 MG TOTAL) BY MOUTH DAILY., Disp: 90 tablet, Rfl: 1   montelukast  (SINGULAIR ) 10 MG tablet, TAKE 1 TABLET BY MOUTH EVERY DAY, Disp: 90 tablet, Rfl: 3   nystatin  (MYCOSTATIN /NYSTOP ) powder, APPLY TO AFFECTED AREA 3 TIMES A DAY, Disp: 60 g, Rfl: 1   olmesartan  (BENICAR ) 20 MG tablet, TAKE 1 TABLET BY MOUTH EVERY DAY, Disp: 90 tablet, Rfl: 1   omeprazole  (PRILOSEC) 20 MG capsule, TAKE 2 CAPSULES BY MOUTH TWICE DAILY - PATIENT HAS TROUBLE SWALLOWING, Disp: 360 capsule, Rfl: 3   Respiratory Therapy Supplies (NEBULIZER/TUBING/MOUTHPIECE) KIT, Use as directed. Insurance preference. Nebulizer kit and mask, Disp: 1 kit, Rfl: 0   Risdiplam (EVRYSDI) 0.75 MG/ML SOLR, , Disp: , Rfl:    rosuvastatin  (CRESTOR ) 5 MG tablet, TAKE 1 TABLET (5 MG TOTAL) BY MOUTH DAILY., Disp: 90 tablet, Rfl: 3   sertraline  (ZOLOFT ) 50 MG tablet, TAKE 1 TABLET BY MOUTH EVERY DAY, Disp: 90 tablet, Rfl: 2   Spacer/Aero-Holding Chambers (AEROCHAMBER PLUS) inhaler, Use as instructed, Disp: 1 each, Rfl:  2   sucralfate  (CARAFATE ) 1 g tablet, TAKE 1 TABLET (1 G TOTAL) BY MOUTH 4 TIMES A DAY WITH MEALS AND AT BEDTIME, Disp: 120 tablet, Rfl: 1   SYMBICORT  160-4.5 MCG/ACT inhaler, INHALE 2 PUFFS INTO THE LUNGS TWICE A DAY, Disp: 10.2 each, Rfl: 6   UNABLE TO FIND, Gloves- 1 box per month as needed, Disp: 200 each, Rfl: prn   UNABLE TO FIND, Under pads- for use daily as needed, Disp: 100 each, Rfl: prn   UNABLE TO FIND, Incontinence liners- use as needed for incontinence Gloves- 1 box as needed for incontinence DX urinary incontinence, Disp: 1 each, Rfl: 0   UNABLE TO FIND, Nebulizer machine and tubing  DX J45.40, Disp: 1 each, Rfl: 0  Observations/Objective: Patient is well-developed, well-nourished in no acute distress.  Resting comfortably at home.  Head is normocephalic, atraumatic.  No labored breathing.  Speech is clear and coherent with logical content.  Patient is alert and oriented at baseline.    Assessment and  Plan: 1. Flu-like symptoms (Primary) - oseltamivir  (TAMIFLU ) 75 MG capsule; Take 1 capsule (75 mg total) by mouth 2 (two) times daily for 5 days.  Dispense: 10 capsule; Refill: 0 INSTRUCTIONS: use a humidifier for nasal congestion Drink plenty of fluids, rest and wash hands frequently to avoid the spread of infection Alternate tylenol  and Motrin  for relief of fever  Recommend retest of COVID and FLU in 2 days with home kit.   Follow Up Instructions: I discussed the assessment and treatment plan with the patient. The patient was provided an opportunity to ask questions and all were answered. The patient agreed with the plan and demonstrated an understanding of the instructions.  A copy of instructions were sent to the patient via MyChart unless otherwise noted below.    The patient was advised to call back or seek an in-person evaluation if the symptoms worsen or if the condition fails to improve as anticipated.    Abdulai Blaylock W Shaniquia Brafford, NP

## 2023-09-04 NOTE — Patient Instructions (Signed)
 Janet Acevedo, thank you for joining Haze LELON Servant, NP for today's virtual visit.  While this provider is not your primary care provider (PCP), if your PCP is located in our provider database this encounter information will be shared with them immediately following your visit.   A [facility] MyChart account gives you access to today's visit and all your visits, tests, and labs performed at [facility]  click here if you don't have a [facility] MyChart account or go to [email or web address]  Consent: (Patient) Janet Acevedo provided verbal consent for this virtual visit at the beginning of the encounter.  Current Medications:  Current Outpatient Medications:    oseltamivir  (TAMIFLU ) 75 MG capsule, Take 1 capsule (75 mg total) by mouth 2 (two) times daily for 5 days., Disp: 10 capsule, Rfl: 0   ACCU-CHEK GUIDE test strip, USE AS INSTRUCTED ONCE DAILY TESTING DX E11.9, Disp: 100 strip, Rfl: 5   albuterol  (PROVENTIL ) (2.5 MG/3ML) 0.083% nebulizer solution, Take 3 mLs (2.5 mg total) by nebulization every 6 (six) hours as needed for wheezing or shortness of breath., Disp: 150 mL, Rfl: 1   albuterol  (VENTOLIN  HFA) 108 (90 Base) MCG/ACT inhaler, Inhale 1-2 puffs into the lungs every 6 (six) hours as needed for wheezing or shortness of breath., Disp: 17 g, Rfl: 0   blood glucose meter kit and supplies, Dispense based on patient and insurance preference. Once daily testing DX E11.9, Disp: 1 each, Rfl: 0   busPIRone  (BUSPAR ) 7.5 MG tablet, TAKE 1 TABLET BY MOUTH 2 TIMES DAILY., Disp: 180 tablet, Rfl: 2   clotrimazole -betamethasone  (LOTRISONE ) cream, Apply topically 2 (two) times daily., Disp: 45 g, Rfl: 1   cromolyn  (OPTICROM ) 4 % ophthalmic solution, PLACE 1 DROP INTO BOTH EYES FOUR TIMES DAILY, Disp: 40 mL, Rfl: 8   Fluocinolone  Acetonide Scalp 0.01 % OIL, APPLY SPARINGLY TO SCALP TWO TIMES WEEKLY, AS NEEDED, FOR ITCH AND FLAKING, Disp: 118.28 mL, Rfl: 3   hydrocortisone  2.5 %  cream, APPLY TO AFFECTED AREA TWICE A DAY, Disp: 28 g, Rfl: 1   JARDIANCE  10 MG TABS tablet, TAKE 1 TABLET BY MOUTH EVERY DAY BEFORE BREAKFAST, Disp: 90 tablet, Rfl: 2   ketoconazole  (NIZORAL ) 2 % shampoo, APPLY TO SCALP FOR 5 MINUTES AND RINSE OUT, Disp: 120 mL, Rfl: 4   Lancets 30G MISC, Once daily testing dx e11.9, Disp: 100 each, Rfl: 5   levocetirizine (XYZAL ) 5 MG tablet, TAKE 1 TABLET BY MOUTH EVERY DAY IN THE EVENING, Disp: 90 tablet, Rfl: 3   levonorgestrel (MIRENA) 20 MCG/DAY IUD, 1 each by Intrauterine route once., Disp: , Rfl:    metoprolol  succinate (TOPROL -XL) 25 MG 24 hr tablet, TAKE 1 TABLET (25 MG TOTAL) BY MOUTH DAILY., Disp: 90 tablet, Rfl: 1   montelukast  (SINGULAIR ) 10 MG tablet, TAKE 1 TABLET BY MOUTH EVERY DAY, Disp: 90 tablet, Rfl: 3   nystatin  (MYCOSTATIN /NYSTOP ) powder, APPLY TO AFFECTED AREA 3 TIMES A DAY, Disp: 60 g, Rfl: 1   olmesartan  (BENICAR ) 20 MG tablet, TAKE 1 TABLET BY MOUTH EVERY DAY, Disp: 90 tablet, Rfl: 1   omeprazole  (PRILOSEC) 20 MG capsule, TAKE 2 CAPSULES BY MOUTH TWICE DAILY - PATIENT HAS TROUBLE SWALLOWING, Disp: 360 capsule, Rfl: 3   Respiratory Therapy Supplies (NEBULIZER/TUBING/MOUTHPIECE) KIT, Use as directed. Insurance preference. Nebulizer kit and mask, Disp: 1 kit, Rfl: 0   Risdiplam (EVRYSDI) 0.75 MG/ML SOLR, , Disp: , Rfl:    rosuvastatin  (CRESTOR ) 5 MG tablet, TAKE 1 TABLET (5 MG  TOTAL) BY MOUTH DAILY., Disp: 90 tablet, Rfl: 3   sertraline  (ZOLOFT ) 50 MG tablet, TAKE 1 TABLET BY MOUTH EVERY DAY, Disp: 90 tablet, Rfl: 2   Spacer/Aero-Holding Chambers (AEROCHAMBER PLUS) inhaler, Use as instructed, Disp: 1 each, Rfl: 2   sucralfate  (CARAFATE ) 1 g tablet, TAKE 1 TABLET (1 G TOTAL) BY MOUTH 4 TIMES A DAY WITH MEALS AND AT BEDTIME, Disp: 120 tablet, Rfl: 1   SYMBICORT  160-4.5 MCG/ACT inhaler, INHALE 2 PUFFS INTO THE LUNGS TWICE A DAY, Disp: 10.2 each, Rfl: 6   UNABLE TO FIND, Gloves- 1 box per month as needed, Disp: 200 each, Rfl: prn   UNABLE TO  FIND, Under pads- for use daily as needed, Disp: 100 each, Rfl: prn   UNABLE TO FIND, Incontinence liners- use as needed for incontinence Gloves- 1 box as needed for incontinence DX urinary incontinence, Disp: 1 each, Rfl: 0   UNABLE TO FIND, Nebulizer machine and tubing  DX J45.40, Disp: 1 each, Rfl: 0   Medications ordered in this encounter:  Meds ordered this encounter  Medications   oseltamivir  (TAMIFLU ) 75 MG capsule    Sig: Take 1 capsule (75 mg total) by mouth 2 (two) times daily for 5 days.    Dispense:  10 capsule    Refill:  0    Supervising Provider:   BLAISE ALEENE KIDD [[id number]]     *If you need refills on other medications prior to your next appointment, please contact your pharmacy*  Follow-Up: Call back or seek an in-person evaluation if the symptoms worsen or if the condition fails to improve as anticipated.  [facility] [phone]  Other Instructions INSTRUCTIONS: use a humidifier for nasal congestion Drink plenty of fluids, rest and wash hands frequently to avoid the spread of infection Alternate tylenol  and Motrin  for relief of fever  Recommend retest of COVID and FLU in 2 days with home kit.    If you have been instructed to have an in-person evaluation today at a local Urgent Care facility, please use the link below. It will take you to a list of all of our available [facility], including address, phone number and hours of operation. Please do not delay care.  [facility]  If you or a family member do not have a primary care provider, use the link below to schedule a visit and establish care. When you choose a [facility] primary care physician or advanced practice provider, you gain a long-term partner in health. Find a Primary Care Provider  Learn more about [facility] in-office and virtual care options: [facility] Get Care Now

## 2023-09-07 ENCOUNTER — Encounter: Payer: Self-pay | Admitting: Family Medicine

## 2023-09-07 ENCOUNTER — Telehealth: Payer: Self-pay

## 2023-09-07 ENCOUNTER — Other Ambulatory Visit: Payer: Self-pay | Admitting: Family Medicine

## 2023-09-07 NOTE — Progress Notes (Signed)
Pt already has tamiflyuu ordered on 2/8 to  2/13

## 2023-09-07 NOTE — Telephone Encounter (Signed)
Copied from CRM [id number]. Topic: Clinical - Request for Lab/Test Order >> Sep 07, 2023  3:48 PM Fuller Mandril wrote: Reason for CRM: Patient called about chest xray - thinks she may need one due to congestion. No order showing for Chest X-ray. Patient called to see if it is possible to do a mobile chest x-ray - does not want to go to hospital to have it done. Thank You.

## 2023-09-08 ENCOUNTER — Other Ambulatory Visit: Payer: Self-pay

## 2023-09-08 DIAGNOSIS — R6889 Other general symptoms and signs: Secondary | ICD-10-CM

## 2023-09-08 NOTE — Telephone Encounter (Signed)
Chest xray ordered patient aware

## 2023-09-09 ENCOUNTER — Telehealth: Payer: Self-pay | Admitting: Family Medicine

## 2023-09-09 NOTE — Telephone Encounter (Signed)
Copied from CRM [id number]. Topic: General - Other >> Sep 09, 2023  9:50 AM Geroge Baseman wrote: Reason for CRM: Patient would like someone to call back in regard to home health, she is seeking someone who can come to her home and complete imaging. Please call back to advise if this is possible. [facility] is what she was thinking was available.

## 2023-09-10 DIAGNOSIS — R0989 Other specified symptoms and signs involving the circulatory and respiratory systems: Secondary | ICD-10-CM | POA: Diagnosis not present

## 2023-09-10 NOTE — Telephone Encounter (Signed)
Order sent.

## 2023-09-13 ENCOUNTER — Encounter: Payer: Self-pay | Admitting: Family Medicine

## 2023-09-14 ENCOUNTER — Inpatient Hospital Stay (HOSPITAL_COMMUNITY)
Admission: EM | Admit: 2023-09-14 | Discharge: 2023-09-16 | DRG: 871 | Disposition: A | Discharge status: Home / Self Care | Payer: 59 | Attending: Internal Medicine | Admitting: Internal Medicine

## 2023-09-14 ENCOUNTER — Encounter (HOSPITAL_COMMUNITY): Payer: Self-pay

## 2023-09-14 ENCOUNTER — Other Ambulatory Visit: Payer: Self-pay

## 2023-09-14 ENCOUNTER — Emergency Department (HOSPITAL_COMMUNITY): Payer: 59

## 2023-09-14 ENCOUNTER — Encounter: Payer: Self-pay | Admitting: Family Medicine

## 2023-09-14 DIAGNOSIS — I10 Essential (primary) hypertension: Secondary | ICD-10-CM | POA: Diagnosis not present

## 2023-09-14 DIAGNOSIS — Z975 Presence of (intrauterine) contraceptive device: Secondary | ICD-10-CM

## 2023-09-14 DIAGNOSIS — Z993 Dependence on wheelchair: Secondary | ICD-10-CM

## 2023-09-14 DIAGNOSIS — J984 Other disorders of lung: Secondary | ICD-10-CM | POA: Diagnosis not present

## 2023-09-14 DIAGNOSIS — R9431 Abnormal electrocardiogram [ECG] [EKG]: Secondary | ICD-10-CM | POA: Diagnosis present

## 2023-09-14 DIAGNOSIS — R000 Tachycardia, unspecified: Secondary | ICD-10-CM | POA: Diagnosis present

## 2023-09-14 DIAGNOSIS — Z1152 Encounter for screening for COVID-19: Secondary | ICD-10-CM

## 2023-09-14 DIAGNOSIS — R3981 Functional urinary incontinence: Secondary | ICD-10-CM | POA: Diagnosis present

## 2023-09-14 DIAGNOSIS — Z8249 Family history of ischemic heart disease and other diseases of the circulatory system: Secondary | ICD-10-CM

## 2023-09-14 DIAGNOSIS — J4540 Moderate persistent asthma, uncomplicated: Secondary | ICD-10-CM | POA: Diagnosis present

## 2023-09-14 DIAGNOSIS — Z83438 Family history of other disorder of lipoprotein metabolism and other lipidemia: Secondary | ICD-10-CM

## 2023-09-14 DIAGNOSIS — J181 Lobar pneumonia, unspecified organism: Secondary | ICD-10-CM | POA: Diagnosis present

## 2023-09-14 DIAGNOSIS — F411 Generalized anxiety disorder: Secondary | ICD-10-CM | POA: Diagnosis present

## 2023-09-14 DIAGNOSIS — R0602 Shortness of breath: Secondary | ICD-10-CM | POA: Diagnosis not present

## 2023-09-14 DIAGNOSIS — R591 Generalized enlarged lymph nodes: Secondary | ICD-10-CM | POA: Diagnosis not present

## 2023-09-14 DIAGNOSIS — J9601 Acute respiratory failure with hypoxia: Secondary | ICD-10-CM | POA: Diagnosis not present

## 2023-09-14 DIAGNOSIS — E1169 Type 2 diabetes mellitus with other specified complication: Secondary | ICD-10-CM | POA: Diagnosis not present

## 2023-09-14 DIAGNOSIS — M419 Scoliosis, unspecified: Secondary | ICD-10-CM | POA: Diagnosis present

## 2023-09-14 DIAGNOSIS — K219 Gastro-esophageal reflux disease without esophagitis: Secondary | ICD-10-CM | POA: Diagnosis present

## 2023-09-14 DIAGNOSIS — R252 Cramp and spasm: Secondary | ICD-10-CM | POA: Diagnosis present

## 2023-09-14 DIAGNOSIS — J189 Pneumonia, unspecified organism: Principal | ICD-10-CM

## 2023-09-14 DIAGNOSIS — E782 Mixed hyperlipidemia: Secondary | ICD-10-CM | POA: Diagnosis not present

## 2023-09-14 DIAGNOSIS — R0781 Pleurodynia: Secondary | ICD-10-CM | POA: Diagnosis not present

## 2023-09-14 DIAGNOSIS — Z981 Arthrodesis status: Secondary | ICD-10-CM | POA: Diagnosis not present

## 2023-09-14 DIAGNOSIS — G121 Other inherited spinal muscular atrophy: Secondary | ICD-10-CM | POA: Diagnosis present

## 2023-09-14 DIAGNOSIS — Z818 Family history of other mental and behavioral disorders: Secondary | ICD-10-CM

## 2023-09-14 DIAGNOSIS — A419 Sepsis, unspecified organism: Secondary | ICD-10-CM | POA: Diagnosis present

## 2023-09-14 DIAGNOSIS — E876 Hypokalemia: Secondary | ICD-10-CM | POA: Diagnosis not present

## 2023-09-14 DIAGNOSIS — K760 Fatty (change of) liver, not elsewhere classified: Secondary | ICD-10-CM | POA: Diagnosis present

## 2023-09-14 DIAGNOSIS — K8020 Calculus of gallbladder without cholecystitis without obstruction: Secondary | ICD-10-CM | POA: Diagnosis not present

## 2023-09-14 DIAGNOSIS — R079 Chest pain, unspecified: Secondary | ICD-10-CM | POA: Diagnosis not present

## 2023-09-14 DIAGNOSIS — Z7951 Long term (current) use of inhaled steroids: Secondary | ICD-10-CM

## 2023-09-14 DIAGNOSIS — R1310 Dysphagia, unspecified: Secondary | ICD-10-CM | POA: Diagnosis not present

## 2023-09-14 DIAGNOSIS — R509 Fever, unspecified: Secondary | ICD-10-CM | POA: Diagnosis not present

## 2023-09-14 DIAGNOSIS — D72829 Elevated white blood cell count, unspecified: Secondary | ICD-10-CM | POA: Diagnosis present

## 2023-09-14 DIAGNOSIS — Z833 Family history of diabetes mellitus: Secondary | ICD-10-CM

## 2023-09-14 DIAGNOSIS — N39498 Other specified urinary incontinence: Secondary | ICD-10-CM | POA: Diagnosis present

## 2023-09-14 DIAGNOSIS — R001 Bradycardia, unspecified: Secondary | ICD-10-CM | POA: Diagnosis not present

## 2023-09-14 DIAGNOSIS — E785 Hyperlipidemia, unspecified: Secondary | ICD-10-CM | POA: Diagnosis present

## 2023-09-14 DIAGNOSIS — R109 Unspecified abdominal pain: Secondary | ICD-10-CM | POA: Diagnosis not present

## 2023-09-14 DIAGNOSIS — R531 Weakness: Secondary | ICD-10-CM | POA: Diagnosis not present

## 2023-09-14 DIAGNOSIS — B369 Superficial mycosis, unspecified: Secondary | ICD-10-CM | POA: Diagnosis present

## 2023-09-14 DIAGNOSIS — Z882 Allergy status to sulfonamides status: Secondary | ICD-10-CM

## 2023-09-14 DIAGNOSIS — Z813 Family history of other psychoactive substance abuse and dependence: Secondary | ICD-10-CM

## 2023-09-14 DIAGNOSIS — Z79899 Other long term (current) drug therapy: Secondary | ICD-10-CM

## 2023-09-14 DIAGNOSIS — R918 Other nonspecific abnormal finding of lung field: Secondary | ICD-10-CM | POA: Diagnosis not present

## 2023-09-14 LAB — CBG MONITORING, ED
Glucose-Capillary: 75 mg/dL (ref 70–99)
Glucose-Capillary: 95 mg/dL (ref 70–99)

## 2023-09-14 LAB — COMPREHENSIVE METABOLIC PANEL
ALT: 17 U/L (ref 0–44)
AST: 13 U/L — ABNORMAL LOW (ref 15–41)
Albumin: 3.8 g/dL (ref 3.5–5.0)
Alkaline Phosphatase: 58 U/L (ref 38–126)
Anion gap: 13 (ref 5–15)
BUN: 13 mg/dL (ref 6–20)
CO2: 25 mmol/L (ref 22–32)
Calcium: 8.7 mg/dL — ABNORMAL LOW (ref 8.9–10.3)
Chloride: 103 mmol/L (ref 98–111)
Creatinine, Ser: <0.3 mg/dL — ABNORMAL LOW (ref 0.44–1.00)
Glucose, Bld: 125 mg/dL — ABNORMAL HIGH (ref 70–99)
Potassium: 2.6 mmol/L — CL (ref 3.5–5.1)
Sodium: 141 mmol/L (ref 135–145)
Total Bilirubin: 1.4 mg/dL — ABNORMAL HIGH (ref 0.0–1.2)
Total Protein: 8.3 g/dL — ABNORMAL HIGH (ref 6.5–8.1)

## 2023-09-14 LAB — CBC WITH DIFFERENTIAL/PLATELET
Abs Immature Granulocytes: 0.37 10*3/uL — ABNORMAL HIGH (ref 0.00–0.07)
Basophils Absolute: 0.1 10*3/uL (ref 0.0–0.1)
Basophils Relative: 0 %
Eosinophils Absolute: 0 10*3/uL (ref 0.0–0.5)
Eosinophils Relative: 0 %
HCT: 42.4 % (ref 36.0–46.0)
Hemoglobin: 13.6 g/dL (ref 12.0–15.0)
Immature Granulocytes: 1 %
Lymphocytes Relative: 7 %
Lymphs Abs: 2.2 10*3/uL (ref 0.7–4.0)
MCH: 27.1 pg (ref 26.0–34.0)
MCHC: 32.1 g/dL (ref 30.0–36.0)
MCV: 84.6 fL (ref 80.0–100.0)
Monocytes Absolute: 2.3 10*3/uL — ABNORMAL HIGH (ref 0.1–1.0)
Monocytes Relative: 7 %
Neutro Abs: 27.1 10*3/uL — ABNORMAL HIGH (ref 1.7–7.7)
Neutrophils Relative %: 85 %
Platelets: 263 10*3/uL (ref 150–400)
RBC: 5.01 MIL/uL (ref 3.87–5.11)
RDW: 16.3 % — ABNORMAL HIGH (ref 11.5–15.5)
Smear Review: ADEQUATE
WBC: 32.1 10*3/uL — ABNORMAL HIGH (ref 4.0–10.5)
nRBC: 0 % (ref 0.0–0.2)

## 2023-09-14 LAB — BRAIN NATRIURETIC PEPTIDE: B Natriuretic Peptide: 109 pg/mL — ABNORMAL HIGH (ref 0.0–100.0)

## 2023-09-14 LAB — TROPONIN I (HIGH SENSITIVITY)
Troponin I (High Sensitivity): 4 ng/L (ref <18)
Troponin I (High Sensitivity): 2 ng/L (ref <18)

## 2023-09-14 LAB — LACTIC ACID, PLASMA
Lactic Acid, Venous: 1.7 mmol/L (ref 0.5–1.9)
Lactic Acid, Venous: 1.8 mmol/L (ref 0.5–1.9)

## 2023-09-14 LAB — PROCALCITONIN: Procalcitonin: 12.78 ng/mL

## 2023-09-14 LAB — HEMOGLOBIN A1C
Hgb A1c MFr Bld: 5.8 % — ABNORMAL HIGH (ref 4.8–5.6)
Mean Plasma Glucose: 119.76 mg/dL

## 2023-09-14 LAB — HCG, SERUM, QUALITATIVE: Preg, Serum: NEGATIVE

## 2023-09-14 MED ORDER — SERTRALINE HCL 50 MG PO TABS
50.0000 mg | ORAL_TABLET | Freq: Every day | ORAL | Status: DC
  Administered 2023-09-14 – 2023-09-15 (×2): 50 mg via ORAL
  Filled 2023-09-14 (×2): qty 1

## 2023-09-14 MED ORDER — IPRATROPIUM-ALBUTEROL 0.5-2.5 (3) MG/3ML IN SOLN
3.0000 mL | RESPIRATORY_TRACT | Status: DC
  Administered 2023-09-14: 3 mL via RESPIRATORY_TRACT
  Filled 2023-09-14: qty 3

## 2023-09-14 MED ORDER — ENOXAPARIN SODIUM 40 MG/0.4ML IJ SOSY
40.0000 mg | PREFILLED_SYRINGE | INTRAMUSCULAR | Status: DC
  Administered 2023-09-14 – 2023-09-15 (×2): 40 mg via SUBCUTANEOUS
  Filled 2023-09-14 (×2): qty 0.4

## 2023-09-14 MED ORDER — METRONIDAZOLE 500 MG/100ML IV SOLN
500.0000 mg | Freq: Once | INTRAVENOUS | Status: AC
  Administered 2023-09-14: 500 mg via INTRAVENOUS
  Filled 2023-09-14: qty 100

## 2023-09-14 MED ORDER — IRBESARTAN 75 MG PO TABS
37.5000 mg | ORAL_TABLET | Freq: Every day | ORAL | Status: DC
  Administered 2023-09-15 – 2023-09-16 (×2): 37.5 mg via ORAL
  Filled 2023-09-14 (×2): qty 1

## 2023-09-14 MED ORDER — SODIUM CHLORIDE 0.9 % IV SOLN: INTRAVENOUS | Status: AC

## 2023-09-14 MED ORDER — BISACODYL 5 MG PO TBEC: 5.0000 mg | DELAYED_RELEASE_TABLET | Freq: Every day | ORAL | Status: DC | PRN

## 2023-09-14 MED ORDER — IOHEXOL 350 MG/ML SOLN
100.0000 mL | Freq: Once | INTRAVENOUS | Status: AC | PRN
  Administered 2023-09-14: 100 mL via INTRAVENOUS

## 2023-09-14 MED ORDER — OXYCODONE HCL 5 MG PO TABS
5.0000 mg | ORAL_TABLET | Freq: Four times a day (QID) | ORAL | Status: DC | PRN
  Filled 2023-09-14: qty 1

## 2023-09-14 MED ORDER — BUSPIRONE HCL 5 MG PO TABS
7.5000 mg | ORAL_TABLET | Freq: Two times a day (BID) | ORAL | Status: DC
  Administered 2023-09-14 – 2023-09-16 (×4): 7.5 mg via ORAL
  Filled 2023-09-14 (×5): qty 2

## 2023-09-14 MED ORDER — METOPROLOL SUCCINATE ER 25 MG PO TB24
25.0000 mg | ORAL_TABLET | Freq: Every day | ORAL | Status: DC
  Administered 2023-09-14 – 2023-09-15 (×2): 25 mg via ORAL
  Filled 2023-09-14 (×2): qty 1

## 2023-09-14 MED ORDER — TRAZODONE HCL 50 MG PO TABS: 25.0000 mg | ORAL_TABLET | Freq: Every evening | ORAL | Status: DC | PRN

## 2023-09-14 MED ORDER — ALBUTEROL SULFATE (2.5 MG/3ML) 0.083% IN NEBU: 2.5000 mg | INHALATION_SOLUTION | Freq: Four times a day (QID) | RESPIRATORY_TRACT | Status: DC

## 2023-09-14 MED ORDER — MONTELUKAST SODIUM 10 MG PO TABS
10.0000 mg | ORAL_TABLET | Freq: Every day | ORAL | Status: DC
  Administered 2023-09-14 – 2023-09-15 (×2): 10 mg via ORAL
  Filled 2023-09-14 (×2): qty 1

## 2023-09-14 MED ORDER — METOPROLOL SUCCINATE ER 25 MG PO TB24: 25.0000 mg | ORAL_TABLET | Freq: Every day | ORAL | Status: DC

## 2023-09-14 MED ORDER — ACETAMINOPHEN 325 MG PO TABS
650.0000 mg | ORAL_TABLET | Freq: Once | ORAL | Status: AC
Start: 2023-09-14 — End: 2023-09-14
  Administered 2023-09-14: 650 mg via ORAL
  Filled 2023-09-14: qty 2

## 2023-09-14 MED ORDER — ONDANSETRON HCL 4 MG/2ML IJ SOLN
4.0000 mg | Freq: Once | INTRAMUSCULAR | Status: AC
  Administered 2023-09-14: 4 mg via INTRAVENOUS
  Filled 2023-09-14: qty 2

## 2023-09-14 MED ORDER — POTASSIUM CHLORIDE CRYS ER 20 MEQ PO TBCR
40.0000 meq | EXTENDED_RELEASE_TABLET | Freq: Once | ORAL | Status: DC
  Filled 2023-09-14: qty 2

## 2023-09-14 MED ORDER — ARFORMOTEROL TARTRATE 15 MCG/2ML IN NEBU
15.0000 ug | INHALATION_SOLUTION | Freq: Two times a day (BID) | RESPIRATORY_TRACT | Status: DC
  Administered 2023-09-14 – 2023-09-16 (×4): 15 ug via RESPIRATORY_TRACT
  Filled 2023-09-14 (×4): qty 2

## 2023-09-14 MED ORDER — INSULIN ASPART 100 UNIT/ML IJ SOLN
0.0000 [IU] | Freq: Three times a day (TID) | INTRAMUSCULAR | Status: DC
  Administered 2023-09-15: 1 [IU] via SUBCUTANEOUS

## 2023-09-14 MED ORDER — SODIUM CHLORIDE 0.9 % IV SOLN: INTRAVENOUS | Status: DC

## 2023-09-14 MED ORDER — DEXTROMETHORPHAN POLISTIREX ER 30 MG/5ML PO SUER: 30.0000 mg | Freq: Two times a day (BID) | ORAL | Status: DC | PRN

## 2023-09-14 MED ORDER — BUDESONIDE 0.25 MG/2ML IN SUSP
0.2500 mg | Freq: Two times a day (BID) | RESPIRATORY_TRACT | Status: DC
  Administered 2023-09-14: 0.25 mg via RESPIRATORY_TRACT
  Filled 2023-09-14: qty 2

## 2023-09-14 MED ORDER — SODIUM CHLORIDE 0.9 % IV SOLN
100.0000 mg | Freq: Two times a day (BID) | INTRAVENOUS | Status: DC
  Administered 2023-09-15 – 2023-09-16 (×3): 100 mg via INTRAVENOUS
  Filled 2023-09-14 (×5): qty 100

## 2023-09-14 MED ORDER — CROMOLYN SODIUM 4 % OP SOLN
1.0000 [drp] | Freq: Four times a day (QID) | OPHTHALMIC | Status: DC
  Administered 2023-09-15 – 2023-09-16 (×4): 1 [drp] via OPHTHALMIC

## 2023-09-14 MED ORDER — LEVOCETIRIZINE DIHYDROCHLORIDE 5 MG PO TABS: 5.0000 mg | ORAL_TABLET | Freq: Every morning | ORAL | Status: DC

## 2023-09-14 MED ORDER — GUAIFENESIN ER 600 MG PO TB12
1200.0000 mg | ORAL_TABLET | Freq: Two times a day (BID) | ORAL | Status: DC
  Administered 2023-09-14 – 2023-09-16 (×4): 1200 mg via ORAL
  Filled 2023-09-14 (×5): qty 2

## 2023-09-14 MED ORDER — NYSTATIN 100000 UNIT/GM EX POWD: Freq: Three times a day (TID) | CUTANEOUS | Status: DC | PRN

## 2023-09-14 MED ORDER — LACTATED RINGERS IV BOLUS
1000.0000 mL | Freq: Once | INTRAVENOUS | Status: AC
  Administered 2023-09-14: 1000 mL via INTRAVENOUS

## 2023-09-14 MED ORDER — PROCHLORPERAZINE EDISYLATE 10 MG/2ML IJ SOLN
10.0000 mg | INTRAMUSCULAR | Status: DC | PRN
  Administered 2023-09-15 – 2023-09-16 (×2): 10 mg via INTRAVENOUS
  Filled 2023-09-14 (×2): qty 2

## 2023-09-14 MED ORDER — IPRATROPIUM-ALBUTEROL 0.5-2.5 (3) MG/3ML IN SOLN
RESPIRATORY_TRACT | Status: AC
  Filled 2023-09-14: qty 3

## 2023-09-14 MED ORDER — SODIUM CHLORIDE 0.9 % IV SOLN
2.0000 g | Freq: Once | INTRAVENOUS | Status: AC
  Administered 2023-09-14: 2 g via INTRAVENOUS
  Filled 2023-09-14: qty 12.5

## 2023-09-14 MED ORDER — ACETAMINOPHEN 650 MG RE SUPP: 650.0000 mg | Freq: Four times a day (QID) | RECTAL | Status: DC | PRN

## 2023-09-14 MED ORDER — ROSUVASTATIN CALCIUM 10 MG PO TABS
5.0000 mg | ORAL_TABLET | Freq: Every evening | ORAL | Status: DC
  Administered 2023-09-14 – 2023-09-15 (×2): 5 mg via ORAL
  Filled 2023-09-14 (×2): qty 1

## 2023-09-14 MED ORDER — IPRATROPIUM-ALBUTEROL 0.5-2.5 (3) MG/3ML IN SOLN
3.0000 mL | Freq: Three times a day (TID) | RESPIRATORY_TRACT | Status: DC
  Administered 2023-09-15 – 2023-09-16 (×5): 3 mL via RESPIRATORY_TRACT
  Filled 2023-09-14 (×5): qty 3

## 2023-09-14 MED ORDER — PANTOPRAZOLE SODIUM 40 MG PO TBEC
40.0000 mg | DELAYED_RELEASE_TABLET | Freq: Every evening | ORAL | Status: DC
  Administered 2023-09-14 – 2023-09-15 (×2): 40 mg via ORAL
  Filled 2023-09-14 (×2): qty 1

## 2023-09-14 MED ORDER — INSULIN ASPART 100 UNIT/ML IJ SOLN
3.0000 [IU] | Freq: Three times a day (TID) | INTRAMUSCULAR | Status: DC
Start: 2023-09-14 — End: 2023-09-16
  Administered 2023-09-14 – 2023-09-15 (×2): 3 [IU] via SUBCUTANEOUS
  Filled 2023-09-14: qty 1

## 2023-09-14 MED ORDER — VANCOMYCIN HCL 1500 MG/300ML IV SOLN
1500.0000 mg | Freq: Once | INTRAVENOUS | Status: AC
  Administered 2023-09-14: 1500 mg via INTRAVENOUS
  Filled 2023-09-14: qty 300

## 2023-09-14 MED ORDER — LORATADINE 10 MG PO TABS
10.0000 mg | ORAL_TABLET | Freq: Every day | ORAL | Status: DC
  Administered 2023-09-15 – 2023-09-16 (×2): 10 mg via ORAL
  Filled 2023-09-14 (×2): qty 1

## 2023-09-14 MED ORDER — DEXAMETHASONE SODIUM PHOSPHATE 10 MG/ML IJ SOLN
10.0000 mg | Freq: Once | INTRAMUSCULAR | Status: AC
  Administered 2023-09-14: 10 mg via INTRAVENOUS
  Filled 2023-09-14: qty 1

## 2023-09-14 MED ORDER — VANCOMYCIN HCL IN DEXTROSE 1-5 GM/200ML-% IV SOLN: 1000.0000 mg | Freq: Once | INTRAVENOUS | Status: DC

## 2023-09-14 MED ORDER — SODIUM CHLORIDE 0.9 % IV SOLN
2.0000 g | INTRAVENOUS | Status: DC
  Administered 2023-09-14 – 2023-09-15 (×2): 2 g via INTRAVENOUS
  Filled 2023-09-14 (×2): qty 20

## 2023-09-14 MED ORDER — POTASSIUM CHLORIDE 10 MEQ/100ML IV SOLN
10.0000 meq | INTRAVENOUS | Status: AC
  Administered 2023-09-14 (×6): 10 meq via INTRAVENOUS
  Filled 2023-09-14 (×6): qty 100

## 2023-09-14 MED ORDER — FENTANYL CITRATE PF 50 MCG/ML IJ SOSY
12.5000 ug | PREFILLED_SYRINGE | INTRAMUSCULAR | Status: DC | PRN
  Administered 2023-09-15: 12.5 ug via INTRAVENOUS
  Filled 2023-09-14: qty 1

## 2023-09-14 MED ORDER — ACETAMINOPHEN 325 MG PO TABS
650.0000 mg | ORAL_TABLET | Freq: Four times a day (QID) | ORAL | Status: DC | PRN
  Administered 2023-09-15: 650 mg via ORAL
  Filled 2023-09-14: qty 2

## 2023-09-14 MED ORDER — SERTRALINE HCL 50 MG PO TABS
50.0000 mg | ORAL_TABLET | Freq: Every day | ORAL | Status: DC
  Filled 2023-09-14: qty 1

## 2023-09-14 NOTE — ED Triage Notes (Signed)
Pt just diagnosed with flu and finished tamiflu. Now c/o chest pain when breathing.

## 2023-09-14 NOTE — ED Notes (Signed)
 Patient transported to CT

## 2023-09-14 NOTE — H&P (Signed)
History and Physical  [facility]  Janet Acevedo BJY:[id number] DOB: September 16, 1990 DOA: 09/14/2023  PCP: Kerri Perches, MD  Patient coming from: Home  Level of care: Telemetry  I have personally briefly reviewed patient's old medical records in [facility]  Chief Complaint: SOB   HPI: Janet Acevedo is a 33 year old nonbinary female with moderate persistent asthma followed by pulmonary in [place], hypertension, controlled type 2 diabetes mellitus, type II spinal muscular atrophy (wheelchair-bound), dyslipidemia, chronic leukocytosis, generalized anxiety disorder, cholelithiasis, dermatomycosis, GERD, hepatic steatosis was recently treated for influenza infection and completed a full course of Tamiflu about a week and a half ago.  She now presents with pleuritic chest pain chest congestion shortness of breath, malaise and nausea.  She reported that her symptoms initially improved after completing course of Tamiflu but over the last several days her symptoms have worsened.  She is having more shortness of breath and oral intolerance.  She was hypoxic on arrival and required supplemental oxygen administration.  Sepsis workup was initiated.  CT chest suggesting pneumonia.  She was started on broad-spectrum antibiotic coverage.  She was hypokalemic with a potassium of 2.6 and her white blood cell count was markedly elevated at 32.1.  Her lactic acid was 1.7.  CT abdomen pelvis demonstrated mild hepatic steatosis.  Patient was admitted for treatment and management of sepsis due to postviral pneumonia and acute respiratory failure with hypoxia.    Past Medical History:  Diagnosis Date   Allergy    Annual visit for general adult medical examination with abnormal findings 07/17/2022   Anxiety    Asthma    Depression, major, single episode, moderate (HCC) 09/05/2017   GERD (gastroesophageal reflux disease)    Hypertension 05/07/2021   Neuromuscular disorder (HCC)    Osteomyelitis  (HCC) June 2011   Otitis externa, eczematoid 05/28/2019   Perennial allergic rhinitis    Seborrheic dermatitis of scalp 2006   Spinal muscle atrophy (HCC)    type 2/3 18 months   Type 2 diabetes mellitus with other specified complication (HCC) 02/19/2022    Past Surgical History:  Procedure Laterality Date   bilateral hip reinsertion  under age 8   hips out of socket, pt was only able to cruise hold on and move short distances    BONE BIOPSY     spinal bone    MIRENA PLACED 10/27/10     SPINAL FUSION  1999   rods placed in thoracolumbar spine to excessive scoliosis primarily    SPINE SURGERY N/A    [employer] 07/29/2020     reports that Janet Acevedo has never smoked. Janet Acevedo has never used smokeless tobacco. Janet Acevedo reports that Janet Acevedo does not drink alcohol and does not use drugs.  Allergies  Allergen Reactions   Other Diarrhea   Ciprofloxacin Nausea And Vomiting    Family History  Problem Relation Age of Onset   Hyperlipidemia Mother    Hypertension Mother    Hypertension Brother    Diabetes Brother        borderline    Anxiety disorder Brother    Depression Brother    Drug abuse Brother     Prior to Admission medications   Medication Sig Start Date End Date Taking? Authorizing Provider  ACCU-CHEK GUIDE test strip USE AS INSTRUCTED ONCE DAILY TESTING DX E11.9 04/08/23   Kerri Perches, MD  albuterol (PROVENTIL) (2.5 MG/3ML) 0.083% nebulizer solution Take 3 mLs (2.5 mg total) by nebulization every 6 (six) hours as  needed for wheezing or shortness of breath. 06/20/23   Claiborne Rigg, NP  albuterol (VENTOLIN HFA) 108 (90 Base) MCG/ACT inhaler Inhale 1-2 puffs into the lungs every 6 (six) hours as needed for wheezing or shortness of breath. 06/20/23   Claiborne Rigg, NP  blood glucose meter kit and supplies Dispense based on patient and insurance preference. Once daily testing DX E11.9 02/24/22   Kerri Perches, MD  busPIRone (BUSPAR) 7.5 MG tablet TAKE 1 TABLET BY MOUTH 2  TIMES DAILY. 03/11/23   Kerri Perches, MD  clotrimazole-betamethasone (LOTRISONE) cream Apply topically 2 (two) times daily. 07/23/23 10/21/23  Kerri Perches, MD  cromolyn (OPTICROM) 4 % ophthalmic solution PLACE 1 DROP INTO BOTH EYES FOUR TIMES DAILY 04/16/22   Kerri Perches, MD  Fluocinolone Acetonide Scalp 0.01 % OIL APPLY SPARINGLY TO SCALP TWO TIMES WEEKLY, AS NEEDED, FOR [facility] AND FLAKING 06/02/23   Kerri Perches, MD  hydrocortisone 2.5 % cream APPLY TO AFFECTED AREA TWICE A DAY 12/22/22   Kerri Perches, MD  JARDIANCE 10 MG TABS tablet TAKE 1 TABLET BY MOUTH EVERY DAY BEFORE BREAKFAST 05/12/23   Kerri Perches, MD  ketoconazole (NIZORAL) 2 % shampoo APPLY TO SCALP FOR 5 MINUTES AND RINSE OUT 08/24/23   Kerri Perches, MD  Lancets 30G MISC Once daily testing dx e11.9 02/24/22   Kerri Perches, MD  levocetirizine (XYZAL) 5 MG tablet TAKE 1 TABLET BY MOUTH EVERY DAY IN THE EVENING 06/21/23   Kerri Perches, MD  levonorgestrel (MIRENA) 20 MCG/DAY IUD 1 each by Intrauterine route once.    [provider]  metoprolol succinate (TOPROL-XL) 25 MG 24 hr tablet TAKE 1 TABLET (25 MG TOTAL) BY MOUTH DAILY. 04/15/23   Kerri Perches, MD  montelukast (SINGULAIR) 10 MG tablet TAKE 1 TABLET BY MOUTH EVERY DAY 11/25/22   Kerri Perches, MD  nystatin (MYCOSTATIN/NYSTOP) powder APPLY TO AFFECTED AREA 3 TIMES A DAY 02/22/23   Kerri Perches, MD  olmesartan (BENICAR) 20 MG tablet TAKE 1 TABLET BY MOUTH EVERY DAY 04/15/23   Kerri Perches, MD  omeprazole (PRILOSEC) 20 MG capsule TAKE 2 CAPSULES BY MOUTH TWICE DAILY - PATIENT HAS TROUBLE SWALLOWING 09/07/22   Kerri Perches, MD  Respiratory Therapy Supplies (NEBULIZER/TUBING/MOUTHPIECE) KIT Use as directed. Insurance preference. Nebulizer kit and mask 12/09/22   Kerri Perches, MD  Risdiplam [facility]) 0.75 MG/ML SOLR  05/16/21   [provider]  rosuvastatin (CRESTOR) 5 MG tablet TAKE  1 TABLET (5 MG TOTAL) BY MOUTH DAILY. 03/24/23   Kerri Perches, MD  sertraline (ZOLOFT) 50 MG tablet TAKE 1 TABLET BY MOUTH EVERY DAY 06/21/23   Kerri Perches, MD  Spacer/Aero-Holding Chambers (AEROCHAMBER PLUS) inhaler Use as instructed 10/04/15   Kerri Perches, MD  sucralfate (CARAFATE) 1 g tablet TAKE 1 TABLET (1 G TOTAL) BY MOUTH 4 TIMES A DAY WITH MEALS AND AT BEDTIME 06/28/23   Franky Macho, MD  SYMBICORT 160-4.5 MCG/ACT inhaler INHALE 2 PUFFS INTO THE LUNGS TWICE A DAY 03/22/23   Kerri Perches, MD  UNABLE TO FIND Gloves- 1 box per month as needed 09/05/18   Kerri Perches, MD  UNABLE TO FIND Under pads- for use daily as needed 09/05/18   Kerri Perches, MD  UNABLE TO FIND Incontinence liners- use as needed for incontinence Gloves- 1 box as needed for incontinence DX urinary incontinence 03/02/19   Kerri Perches,  MD  UNABLE TO FIND Nebulizer machine and tubing  DX J45.40 12/09/22   Kerri Perches, MD  Calcium Carbonate (CALCIUM 500 PO) Take 1 tablet by mouth daily.   04/09/15  [provider]   Physical Exam: Vitals:   09/14/23 1003 09/14/23 1100 09/14/23 1115 09/14/23 1200  BP: 101/65 113/79 123/86 119/89  Pulse: (!) 108 (!) 103 100 93  Resp: (!) 28 (!) 26 (!) 24 (!) 21  Temp: 98.6 F (37 C)     TempSrc: Oral     SpO2: 97% 92% 94% 93%  Weight:       Constitutional: lying supine on gurnee, tachypneic and moderately distressed. Eyes: PERRL, lids and conjunctivae normal ENMT: Mucous membranes are moist. Posterior pharynx clear of any exudate or lesions.Normal dentition.  Neck: normal, supple, no masses, no thyromegaly Respiratory: diffuse rales heard, coarse sounds, expiratory wheezing heard, diffuse rhonchi sounds.  Cardiovascular: tachycardic, normal s1, s2 sounds, no murmurs / rubs / gallops. 1+ bilateral extremity edema. 2+ pedal pulses. No carotid bruits.  Abdomen: obese, soft, no tenderness, no masses palpated. No  hepatosplenomegaly. Bowel sounds positive.  Musculoskeletal: no clubbing / cyanosis. No joint deformity upper and lower extremities. Good ROM, no contractures. Normal muscle tone.  Skin: non jaundiced; No induration Neurologic: CN 2-12 grossly intact. Sensation intact, DTR normal. Strength 5/5 in all 4.  Psychiatric: Normal judgment and insight. Alert and oriented x 3. Flat affect.    Labs on Admission: I have personally reviewed following labs and imaging studies  CBC: Recent Labs  Lab 09/14/23 0737  WBC 32.1*  NEUTROABS 27.1*  HGB 13.6  HCT 42.4  MCV 84.6  PLT 263   Basic Metabolic Panel: Recent Labs  Lab 09/14/23 0737  NA 141  K 2.6*  CL 103  CO2 25  GLUCOSE 125*  BUN 13  CREATININE <0.30*  CALCIUM 8.7*   GFR: CrCl cannot be calculated (This lab value cannot be used to calculate CrCl because it is not a number: <0.30). Liver Function Tests: Recent Labs  Lab 09/14/23 0737  AST 13*  ALT 17  ALKPHOS 58  BILITOT 1.4*  PROT 8.3*  ALBUMIN 3.8   No results for input(s): "LIPASE", "AMYLASE" in the last 168 hours. No results for input(s): "AMMONIA" in the last 168 hours. Coagulation Profile: No results for input(s): "INR", "PROTIME" in the last 168 hours. Cardiac Enzymes: No results for input(s): "CKTOTAL", "CKMB", "CKMBINDEX", "TROPONINI" in the last 168 hours. BNP (last 3 results) No results for input(s): "PROBNP" in the last 8760 hours. HbA1C: No results for input(s): "HGBA1C" in the last 72 hours. CBG: No results for input(s): "GLUCAP" in the last 168 hours. Lipid Profile: No results for input(s): "CHOL", "HDL", "LDLCALC", "TRIG", "CHOLHDL", "LDLDIRECT" in the last 72 hours. Thyroid Function Tests: No results for input(s): "TSH", "T4TOTAL", "FREET4", "T3FREE", "THYROIDAB" in the last 72 hours. Anemia Panel: No results for input(s): "VITAMINB12", "FOLATE", "FERRITIN", "TIBC", "IRON", "RETICCTPCT" in the last 72 hours. Urine analysis:    Component Value  Date/Time   COLORURINE DARK YELLOW 10/09/2016 1602   APPEARANCEUR CLEAR 10/09/2016 1602   LABSPEC 1.022 10/09/2016 1602   PHURINE 6.0 10/09/2016 1602   GLUCOSEU NEGATIVE 10/09/2016 1602   HGBUR 3+ (A) 10/09/2016 1602   BILIRUBINUR negative 10/13/2019 1143   BILIRUBINUR negative 05/22/2015 1449   KETONESUR negative 10/13/2019 1143   KETONESUR NEGATIVE 10/09/2016 1602   PROTEINUR 1+ (A) 10/09/2016 1602   UROBILINOGEN 0.2 10/13/2019 1143   UROBILINOGEN 1 05/10/2013 0941  NITRITE Negative 10/13/2019 1143   NITRITE NEGATIVE 10/09/2016 1602   LEUKOCYTESUR Negative 10/13/2019 1143    Radiological Exams on Admission: CT ABDOMEN PELVIS W CONTRAST Result Date: 09/14/2023 CLINICAL DATA:  Acute abdominal pain EXAM: CT ABDOMEN AND PELVIS WITH CONTRAST TECHNIQUE: Multidetector CT imaging of the abdomen and pelvis was performed using the standard protocol following bolus administration of intravenous contrast. RADIATION DOSE REDUCTION: This exam was performed according to the departmental dose-optimization program which includes automated exposure control, adjustment of the mA and/or kV according to patient size and/or use of iterative reconstruction technique. CONTRAST:  OMNIPAQUE IOHEXOL 350 MG/ML SOLN COMPARISON:  None Available. FINDINGS: Lower Chest: Bilateral lower lobe airspace disease. Hepatobiliary: No suspicious hepatic masses identified. Mild diffuse hepatic steatosis noted. Tiny gallstone seen. No signs of cholecystitis or biliary ductal dilatation. Pancreas:  No mass or inflammatory changes. Spleen: Within normal limits in size and appearance. Adrenals/Urinary Tract: No suspicious masses identified. No evidence of ureteral calculi or hydronephrosis. Stomach/Bowel: No evidence of obstruction, inflammatory process or abnormal fluid collections. Normal appendix visualized. Vascular/Lymphatic: No pathologically enlarged lymph nodes. No acute vascular findings. Reproductive: IUD seen in expected  position. No mass or other significant abnormality. Other:  None. Musculoskeletal: No suspicious bone lesions identified. Posterior spinal fixation rods noted as well as internal fixation hardware in both proximal femurs. IMPRESSION: Bilateral lower lobe airspace disease, suspicious for pneumonia. No acute findings within the abdomen or pelvis. Mild hepatic steatosis. Cholelithiasis. No radiographic evidence of cholecystitis. Electronically Signed   By: Danae Orleans M.D.   On: 09/14/2023 12:15   CT Angio Chest PE W and/or Wo Contrast Result Date: 09/14/2023 CLINICAL DATA:  Pleuritic chest pain. High probability for pulmonary embolism. EXAM: CT ANGIOGRAPHY CHEST WITH CONTRAST TECHNIQUE: Multidetector CT imaging of the chest was performed using the standard protocol during bolus administration of intravenous contrast. Multiplanar CT image reconstructions and MIPs were obtained to evaluate the vascular anatomy. RADIATION DOSE REDUCTION: This exam was performed according to the departmental dose-optimization program which includes automated exposure control, adjustment of the mA and/or kV according to patient size and/or use of iterative reconstruction technique. CONTRAST:  OMNIPAQUE IOHEXOL 350 MG/ML SOLN COMPARISON:  None Available. FINDINGS: Cardiovascular: Very poor opacification of pulmonary arteries is seen on this study, and pulmonary embolism cannot be excluded by this exam. No evidence of thoracic aortic dissection or aneurysm. Mediastinum/Nodes: Mild bilateral hilar lymphadenopathy and mild subcarinal mediastinal lymphadenopathy are seen. Lungs/Pleura: Bilateral lower lobe consolidation with air bronchograms noted as well as patchy and nodular infiltrates in the upper and right middle lobes. This is consistent with infectious or inflammatory etiology. No pleural effusion. Upper abdomen: Hepatic steatosis noted. Musculoskeletal: No suspicious bone lesions identified. Thoracolumbar posterior spinal  fixation rods noted. Review of the MIP images confirms the above findings. IMPRESSION: Very poor opacification of pulmonary arteries on this study, and pulmonary embolism cannot be excluded . Bilateral lower lobe consolidation with air bronchograms, with patchy and nodular infiltrates in the upper and right middle lobes. This is consistent with infectious or inflammatory etiology. Mild bilateral hilar and subcarinal mediastinal lymphadenopathy, which may be reactive in etiology. Hepatic steatosis. Electronically Signed   By: Danae Orleans M.D.   On: 09/14/2023 11:55   DG Chest Portable 1 View Result Date: 09/14/2023 CLINICAL DATA:  Chest pain and shortness of breath. EXAM: PORTABLE CHEST 1 VIEW COMPARISON:  05/09/2021 FINDINGS: Cardiopericardial silhouette is at upper limits of normal for size. Prominent bronchovascular markings as before with no focal  consolidation or substantial pleural effusion. Extensive thoracolumbar fusion hardware evident. Bones are diffusely demineralized. IMPRESSION: Stable.  No new or acute interval findings. Electronically Signed   By: Kennith Center M.D.   On: 09/14/2023 06:44   EKG: Independently reviewed. Prolonged QT interval, sinus tachycardia  Assessment/Plan Principal Problem:   Sepsis due to pneumonia [facility]) Active Problems:   Acute respiratory failure with hypoxia (HCC)   Dermatomycosis   Type II spinal muscular atrophy (HCC)   GERD   Urinary incontinence due to severe physical disability   Dyslipidemia   Moderate persistent asthma   H/O spinal fusion   GAD (generalized anxiety disorder)   Wheelchair dependent   Dysphagia   Hypertension   Muscle cramps   Type 2 diabetes mellitus with other specified complication (HCC)   Leukocytosis   Sinus tachycardia   Hypokalemia   Hyperbilirubinemia   Hepatic steatosis   Cholelithiasis   Pleuritic chest pain   Prolonged QT interval   Acute respiratory failure with hypoxia -secondary to community-acquired  pneumonia -Continue supplemental oxygen with plan to wean to room air as able -Weaning orders placed for oxygen -Scheduled bronchodilators as noted below -Treating sepsis and pneumonia aggressively -Added flutter valve, chest physiotherapy -Added mucolytic's and cough suppressants -RT for sputum induction, send sputum for culture  Sepsis secondary to pneumonia Community-acquired pneumonia -Demonstrated on CT scan -Continue broad-spectrum antibiotics as ordered -Ceftriaxone 2 g IV every 24 hours and doxycycline 100 mg IV twice daily -Mucolytic's and cough suppressants as ordered -Continue scheduled bronchodilators -Flutter valve -Chest physiotherapy -Pulmonary toilet -follow blood and sputum cultures -check strep pneumonia and legionella urinary antigen testing   Pleuritic chest wall pain -Secondary to frequent coughing associated with postviral pneumonia -Treating supportively -Medications ordered as needed for pain and discomfort -Cough suppressants -mucolytics  Hypokalemia -Secondary to poor oral intake -Repleted in the emergency department by the ED provider -Follow-up BMP in the morning  Prolonged QT interval -Check magnesium -Replace potassium as noted above -Avoid medications that prolongs QT -Cardiac monitoring  GERD -Pantoprazole ordered for GI protection  Controlled type 2 diabetes mellitus -As evidenced by hemoglobin A1c of 6.0% -Continue SSI coverage with CBG monitoring as ordered -Anticipating some steroid-induced hyperglycemia -Added NovoLog Premeal coverage -Adjust therapies as needed  Dyslipidemia -Resume home rosuvastatin   Essential hypertension -Resume home medications  Leukocytosis -Severe -Presumably secondary to pneumonia -dexamethasone 10 mg IV x 1 dose given 2/18  -Pt has a reported history of chronic leukocytosis and has been evaluated by hematologist in the past -appears to be a leukemoid reaction as had a normal WBC on 05/10/23   -Repeat CBC with differential daily to monitor  Moderate persistent asthma -Bronchodilators and steroids as ordered -Treating pneumonia aggressively as noted above -RT consultation for close following while inpatient   Type II spinal muscular atrophy Wheelchair bound  Hepatic steatosis - mild  - incidental finding on CT scan  DVT prophylaxis: enoxaparin   Code Status: Full   Family Communication: mother bedside updated 2/18   Disposition Plan: anticipate home   Consults called:   Admission status: IP   Level of care: Telemetry Standley Dakins MD [facility] How to contact the [facility] Attending or Consulting provider 7A - 7P or covering provider during after hours 7P -7A, for this patient?  Check the care team in [facility] and look for a) attending/consulting [facility] provider listed and b) the [facility] team listed Log into [email or web address] and use [facility] universal password to access. If you do not have  the password, please contact the hospital operator. Locate the [facility] provider you are looking for under [facility] and page to a number that you can be directly reached. If you still have difficulty reaching the provider, please page the [facility] (Director on Call) for the Hospitalists listed on amion for assistance.   If 7PM-7AM, please contact night-coverage [email or web address] Password [facility]  09/14/2023, 12:36 PM

## 2023-09-14 NOTE — Sepsis Progress Note (Signed)
 Sepsis protocol monitored by eLink ?

## 2023-09-14 NOTE — ED Provider Notes (Signed)
[facility] Provider Note  CSN: [id number] Arrival date & time: 09/14/23 2841  Chief Complaint(s) Shortness of Breath and Flu Positive   HPI Janet Acevedo is a 33 y.o. adult with PMH spinal muscle atrophy, asthma, GERD, T2DM who presents [facility] for evaluation of cough, shortness of breath nausea.  States that she was diagnosed with the flu 2 weeks ago and was placed on Tamiflu.  She states that she initially felt like she was improving but 3 days ago started to feel significantly worse.  She is having difficulty tolerating p.o. and dry heaving and gagging when she attempts to eat solids or liquids.  Today she started to have some worsening shortness of breath and chest pain and called EMS.  Patient is hypoxic to 89% on room air and arrives tachycardic with rates in the 120s.  Denies current abdominal pain, diarrhea, fever or other systemic symptoms.   Past Medical History Past Medical History:  Diagnosis Date   Allergy    Annual visit for general adult medical examination with abnormal findings 07/17/2022   Anxiety    Asthma    Depression, major, single episode, moderate (HCC) 09/05/2017   GERD (gastroesophageal reflux disease)    Hypertension 05/07/2021   Neuromuscular disorder (HCC)    Osteomyelitis (HCC) June 2011   Otitis externa, eczematoid 05/28/2019   Perennial allergic rhinitis    Seborrheic dermatitis of scalp 2006   Spinal muscle atrophy (HCC)    type 2/3 18 months   Type 2 diabetes mellitus with other specified complication (HCC) 02/19/2022   Patient Active Problem List   Diagnosis Date Noted   Elevated serum creatinine 03/15/2023   Leukocytosis 03/15/2023   Decubital ulcer 02/16/2023   Annual visit for general adult medical examination with abnormal findings 07/17/2022   Urinary frequency 03/04/2022   Type 2 diabetes mellitus with other specified complication (HCC) 02/19/2022   Muscle cramps 11/06/2021    Palpitations 05/18/2021   Audible heartbeat in ear 05/18/2021   Hypertension 05/07/2021   Vitamin D deficiency 03/03/2021   Neck swelling 02/23/2020   Wheelchair dependent 01/03/2020   Depression, major, single episode, moderate (HCC) 09/05/2017   Dysphagia 08/24/2017   GAD (generalized anxiety disorder) 02/08/2017   Moderate persistent asthma 01/14/2017   H/O spinal fusion 01/14/2017   Osteopenia determined by x-ray 01/14/2017   Dyslipidemia 09/28/2016   Seborrheic dermatitis of scalp 02/07/2015   Urinary incontinence due to severe physical disability 02/12/2014   Dermatomycosis 06/29/2010   GERD 06/29/2010   Type II spinal muscular atrophy (HCC) 02/24/2010   Seasonal allergies 02/24/2010   Home Medication(s) Prior to Admission medications   Medication Sig Start Date End Date Taking? Authorizing Provider  ACCU-CHEK GUIDE test strip USE AS INSTRUCTED ONCE DAILY TESTING DX E11.9 04/08/23   Kerri Perches, MD  albuterol (PROVENTIL) (2.5 MG/3ML) 0.083% nebulizer solution Take 3 mLs (2.5 mg total) by nebulization every 6 (six) hours as needed for wheezing or shortness of breath. 06/20/23   Claiborne Rigg, NP  albuterol (VENTOLIN HFA) 108 (90 Base) MCG/ACT inhaler Inhale 1-2 puffs into the lungs every 6 (six) hours as needed for wheezing or shortness of breath. 06/20/23   Claiborne Rigg, NP  blood glucose meter kit and supplies Dispense based on patient and insurance preference. Once daily testing DX E11.9 02/24/22   Kerri Perches, MD  busPIRone (BUSPAR) 7.5 MG tablet TAKE 1 TABLET BY MOUTH 2 TIMES DAILY. 03/11/23   Lodema Hong,  Milus Mallick, MD  clotrimazole-betamethasone (LOTRISONE) cream Apply topically 2 (two) times daily. 07/23/23 10/21/23  Kerri Perches, MD  cromolyn (OPTICROM) 4 % ophthalmic solution PLACE 1 DROP INTO BOTH EYES FOUR TIMES DAILY 04/16/22   Kerri Perches, MD  Fluocinolone Acetonide Scalp 0.01 % OIL APPLY SPARINGLY TO SCALP TWO TIMES WEEKLY, AS NEEDED, FOR  [facility] AND FLAKING 06/02/23   Kerri Perches, MD  hydrocortisone 2.5 % cream APPLY TO AFFECTED AREA TWICE A DAY 12/22/22   Kerri Perches, MD  JARDIANCE 10 MG TABS tablet TAKE 1 TABLET BY MOUTH EVERY DAY BEFORE BREAKFAST 05/12/23   Kerri Perches, MD  ketoconazole (NIZORAL) 2 % shampoo APPLY TO SCALP FOR 5 MINUTES AND RINSE OUT 08/24/23   Kerri Perches, MD  Lancets 30G MISC Once daily testing dx e11.9 02/24/22   Kerri Perches, MD  levocetirizine (XYZAL) 5 MG tablet TAKE 1 TABLET BY MOUTH EVERY DAY IN THE EVENING 06/21/23   Kerri Perches, MD  levonorgestrel (MIRENA) 20 MCG/DAY IUD 1 each by Intrauterine route once.    [provider]  metoprolol succinate (TOPROL-XL) 25 MG 24 hr tablet TAKE 1 TABLET (25 MG TOTAL) BY MOUTH DAILY. 04/15/23   Kerri Perches, MD  montelukast (SINGULAIR) 10 MG tablet TAKE 1 TABLET BY MOUTH EVERY DAY 11/25/22   Kerri Perches, MD  nystatin (MYCOSTATIN/NYSTOP) powder APPLY TO AFFECTED AREA 3 TIMES A DAY 02/22/23   Kerri Perches, MD  olmesartan (BENICAR) 20 MG tablet TAKE 1 TABLET BY MOUTH EVERY DAY 04/15/23   Kerri Perches, MD  omeprazole (PRILOSEC) 20 MG capsule TAKE 2 CAPSULES BY MOUTH TWICE DAILY - PATIENT HAS TROUBLE SWALLOWING 09/07/22   Kerri Perches, MD  Respiratory Therapy Supplies (NEBULIZER/TUBING/MOUTHPIECE) KIT Use as directed. Insurance preference. Nebulizer kit and mask 12/09/22   Kerri Perches, MD  Risdiplam [facility]) 0.75 MG/ML SOLR  05/16/21   [provider]  rosuvastatin (CRESTOR) 5 MG tablet TAKE 1 TABLET (5 MG TOTAL) BY MOUTH DAILY. 03/24/23   Kerri Perches, MD  sertraline (ZOLOFT) 50 MG tablet TAKE 1 TABLET BY MOUTH EVERY DAY 06/21/23   Kerri Perches, MD  Spacer/Aero-Holding Chambers (AEROCHAMBER PLUS) inhaler Use as instructed 10/04/15   Kerri Perches, MD  sucralfate (CARAFATE) 1 g tablet TAKE 1 TABLET (1 G TOTAL) BY MOUTH 4 TIMES A DAY WITH MEALS AND AT BEDTIME  06/28/23   Franky Macho, MD  SYMBICORT 160-4.5 MCG/ACT inhaler INHALE 2 PUFFS INTO THE LUNGS TWICE A DAY 03/22/23   Kerri Perches, MD  UNABLE TO FIND Gloves- 1 box per month as needed 09/05/18   Kerri Perches, MD  UNABLE TO FIND Under pads- for use daily as needed 09/05/18   Kerri Perches, MD  UNABLE TO FIND Incontinence liners- use as needed for incontinence Gloves- 1 box as needed for incontinence DX urinary incontinence 03/02/19   Kerri Perches, MD  UNABLE TO FIND Nebulizer machine and tubing  DX J45.40 12/09/22   Kerri Perches, MD  Calcium Carbonate (CALCIUM 500 PO) Take 1 tablet by mouth daily.   04/09/15  [provider]  Past Surgical History Past Surgical History:  Procedure Laterality Date   bilateral hip reinsertion  under age 24   hips out of socket, pt was only able to cruise hold on and move short distances    BONE BIOPSY     spinal bone    MIRENA PLACED 10/27/10     SPINAL FUSION  1999   rods placed in thoracolumbar spine to excessive scoliosis primarily    SPINE SURGERY N/A    [employer] 07/29/2020   Family History Family History  Problem Relation Age of Onset   Hyperlipidemia Mother    Hypertension Mother    Hypertension Brother    Diabetes Brother        borderline    Anxiety disorder Brother    Depression Brother    Drug abuse Brother     Social History Social History   Tobacco Use   Smoking status: Never   Smokeless tobacco: Never  Vaping Use   Vaping status: Never Used  Substance Use Topics   Alcohol use: No   Drug use: No   Allergies Other and Ciprofloxacin  Review of Systems Review of Systems  Respiratory:  Positive for cough, chest tightness and shortness of breath.   Gastrointestinal:  Positive for nausea.    Physical Exam Vital Signs  I have reviewed the triage vital  signs There were no vitals taken for this visit.  Physical Exam Constitutional:      General: Lindora is not in acute distress.    Appearance: Normal appearance.  HENT:     Head: Normocephalic and atraumatic.     Nose: No congestion or rhinorrhea.  Eyes:     General:        Right eye: No discharge.        Left eye: No discharge.     Extraocular Movements: Extraocular movements intact.     Pupils: Pupils are equal, round, and reactive to light.  Cardiovascular:     Rate and Rhythm: Regular rhythm. Tachycardia present.     Heart sounds: No murmur heard. Pulmonary:     Effort: No respiratory distress.     Breath sounds: Wheezing and rales present.  Abdominal:     General: There is no distension.     Tenderness: There is no abdominal tenderness.  Musculoskeletal:        General: Normal range of motion.     Cervical back: Normal range of motion.  Skin:    General: Skin is warm and dry.  Neurological:     General: No focal deficit present.     Mental Status: Yvanna is alert.     ED Results and Treatments Labs (all labs ordered are listed, but only abnormal results are displayed) Labs Reviewed  CULTURE, BLOOD (ROUTINE X 2)  CULTURE, BLOOD (ROUTINE X 2)  COMPREHENSIVE METABOLIC PANEL  CBC WITH DIFFERENTIAL/PLATELET  BRAIN NATRIURETIC PEPTIDE  LACTIC ACID, PLASMA  LACTIC ACID, PLASMA  TROPONIN I (HIGH SENSITIVITY)  Radiology No results found.  Pertinent labs & imaging results that were available during my care of the patient were reviewed by me and considered in my medical decision making (see MDM for details).  Medications Ordered in ED Medications  ondansetron (ZOFRAN) injection 4 mg (has no administration in time range)  lactated ringers bolus 1,000 mL (has no administration in time range)                                                                                                                                      Procedures .Critical Care  Performed by: Glendora Score, MD Authorized by: Glendora Score, MD   Critical care provider statement:    Critical care time (minutes):  30   Critical care was necessary to treat or prevent imminent or life-threatening deterioration of the following conditions:  Respiratory failure   Critical care was time spent personally by me on the following activities:  Development of treatment plan with patient or surrogate, discussions with consultants, evaluation of patient's response to treatment, examination of patient, ordering and review of laboratory studies, ordering and review of radiographic studies, ordering and performing treatments and interventions, pulse oximetry, re-evaluation of patient's condition and review of old charts   (including critical care time)  Medical Decision Making / ED Course   This patient presents to the ED for concern of shortness of breath, cough, chills, this involves an extensive number of treatment options, and is a complaint that carries with it a high risk of complications and morbidity.  The differential diagnosis includes Pe, PTX, Pulmonary Edema, ARDS, COPD/Asthma, ACS, CHF exacerbation, Arrhythmia, Pericardial Effusion/Tamponade, Anemia, Sepsis, Acidosis/Hypercapnia, Anxiety, Viral URI  MDM: Patient seen emergency room for evaluation of dyspnea.  Physical exam with wheezing and rales bilaterally but no significant assessor muscle use.  Physical exam otherwise unremarkable.  Patient placed on 2 L nasal cannula to maintain oxygen saturations.  At time of signout, patient pending laboratory evaluation and imaging studies.  Please see provider signout for continuation of workup.   Additional history obtained:  -External records from outside source obtained and reviewed including: Chart review including previous notes, labs, imaging, consultation notes   Lab  Tests: -I ordered, reviewed, and interpreted labs.   The pertinent results include:   Labs Reviewed  CULTURE, BLOOD (ROUTINE X 2)  CULTURE, BLOOD (ROUTINE X 2)  COMPREHENSIVE METABOLIC PANEL  CBC WITH DIFFERENTIAL/PLATELET  BRAIN NATRIURETIC PEPTIDE  LACTIC ACID, PLASMA  LACTIC ACID, PLASMA  TROPONIN I (HIGH SENSITIVITY)      Imaging Studies ordered: I ordered imaging studies including CXR I independently visualized and interpreted imaging. I agree with the radiologist interpretation   Medicines ordered and prescription drug management: Meds ordered this encounter  Medications   ondansetron (ZOFRAN) injection 4 mg   lactated ringers bolus 1,000 mL    -I have reviewed the patients home medicines and have made adjustments as needed  Critical interventions Oxygen supplementation    Cardiac Monitoring: The patient was maintained on a cardiac monitor.  I personally viewed and interpreted the cardiac monitored which showed an underlying rhythm of: Sinus tachycardia  Social Determinants of Health:  Factors impacting patients care include: none   Reevaluation: After the interventions noted above, I reevaluated the patient and found that they have :improved  Co morbidities that complicate the patient evaluation  Past Medical History:  Diagnosis Date   Allergy    Annual visit for general adult medical examination with abnormal findings 07/17/2022   Anxiety    Asthma    Depression, major, single episode, moderate (HCC) 09/05/2017   GERD (gastroesophageal reflux disease)    Hypertension 05/07/2021   Neuromuscular disorder (HCC)    Osteomyelitis (HCC) June 2011   Otitis externa, eczematoid 05/28/2019   Perennial allergic rhinitis    Seborrheic dermatitis of scalp 2006   Spinal muscle atrophy (HCC)    type 2/3 18 months   Type 2 diabetes mellitus with other specified complication (HCC) 02/19/2022      Dispostion: I considered admission for this patient, and  disposition pending completion of laboratory evaluation and imaging studies.  Please see provider signout note for continuation of workup.     Final Clinical Impression(s) / ED Diagnoses Final diagnoses:  None     @PCDICTATION @    Glendora Score, MD 09/14/23 2311

## 2023-09-14 NOTE — ED Notes (Signed)
Patient turned and repositioned at this time. Patient is resting comfortably with family at bedside. Respirations even and unlabored.

## 2023-09-14 NOTE — ED Provider Notes (Addendum)
Patient presents for flulike symptoms.  She was diagnosed with flu a week and a half ago.  She underwent course of Tamiflu.  She felt like her symptoms improved but then worsened again over the past several days.  She has had p.o. intolerance, shortness of breath, chest pain.  She was found to be hypoxic on arrival.  IV fluids and septic workup was initiated. Physical Exam  BP 101/71   Pulse (!) 117   Temp (!) 100.6 F (38.1 C) (Oral)   Resp 16   SpO2 94%   Physical Exam Vitals and nursing note reviewed.  Constitutional:      General: Janet Acevedo is not in acute distress.    Appearance: Janet Ace[facility]o is well-developed. Janet Acevedo is not ill-appearing, toxic-appearing or diaphoretic.  HENT:     Head: Normocephalic and atraumatic.     Mouth/Throat:     Mouth: Mucous membranes are moist.  Eyes:     Conjunctiva/sclera: Conjunctivae normal.  Cardiovascular:     Rate and Rhythm: Regular rhythm. Tachycardia present.     Heart sounds: No murmur heard. Pulmonary:     Effort: Pulmonary effort is normal. No respiratory distress.     Breath sounds: Normal breath sounds.  Abdominal:     Palpations: Abdomen is soft.     Tenderness: There is no abdominal tenderness.  Musculoskeletal:        General: No swelling.     Cervical back: Neck supple.     Comments: Chronic contractures  Skin:    General: Skin is warm and dry.     Coloration: Skin is not cyanotic or pale.  Neurological:     Mental Status: Janet Acevedo is alert. Mental status is at baseline.  Psychiatric:        Mood and Affect: Mood normal.        Behavior: Behavior normal.     Procedures  .Ultrasound ED Peripheral IV (Provider)  Date/Time: 09/14/2023 8:24 AM  Performed by: Gloris Manchester, MD Authorized by: Gloris Manchester, MD   Procedure details:    Indications: multiple failed IV attempts     Skin Prep: chlorhexidine gluconate     Location:  Left anterior forearm   Angiocath:  20 G   Bedside Ultrasound Guided: Yes     Images: not archived      Patient tolerated procedure without complications: Yes     Dressing applied: Yes     ED Course / MDM    Medical Decision Making Amount and/or Complexity of Data Reviewed Labs: ordered. Radiology: ordered.  Risk OTC drugs. Prescription drug management. Decision regarding hospitalization.   Initial lab work notable for large leukocytosis, hypokalemia.  Replacement potassium was ordered.  Patient meets sepsis criteria and was placed on empiric antibiotics.  Imaging studies were ordered.  Although CTA cannot exclude PEs, it does show areas of inflammation consistent with multifocal pneumonia.  This is more consistent with recent symptoms.  On reassessment, patient is sleeping.  Patient remains on supplemental 2 L of oxygen with SpO2 in the low 90s.  Patient was admitted for further management.  CRITICAL CARE Performed by: Gloris Manchester   Total critical care time: 32 minutes  Critical care time was exclusive of separately billable procedures and treating other patients.  Critical care was necessary to treat or prevent imminent or life-threatening deterioration.  Critical care was time spent personally by me on the following activities: development of treatment plan with patient and/or surrogate as well as nursing, discussions with consultants, evaluation of patient's  response to treatment, examination of patient, obtaining history from patient or surrogate, ordering and performing treatments and interventions, ordering and review of laboratory studies, ordering and review of radiographic studies, pulse oximetry and re-evaluation of patient's condition.      Gloris Manchester, MD 09/14/23 1201    Gloris Manchester, MD 09/14/23 [phone]

## 2023-09-14 NOTE — Hospital Course (Signed)
33 year old nonbinary female with restrictive lung disease followed by pulmonary in [place], hypertension, controlled type 2 diabetes mellitus, type II spinal muscular atrophy (wheelchair-bound), dyslipidemia, chronic leukocytosis, generalized anxiety disorder, cholelithiasis, GERD presenting with coughing, shortness of breath, chest congestion, and malaise for at least the last 3 days.  The patient had a virtual visit on 09/04/2023 with flulike symptoms.  She was placed on a 5-day course of oseltamavir.  She had stated that she had a negative home test for influenza and COVID apparently.  She states that she initially felt better, but worsened again with the above symptoms.  He had some associated dry heaving but denied any diarrhea.  She did have some abdominal discomfort..  Passing flatus.  Last bowel movement was 2 days prior to admission.  In the ED, the patient was febrile to 100.6 F and tachycardic in the 120 range.  She was hemodynamically stable.  Oxygen saturation was 89% room air.  She was placed on 2 L with saturation up to 97%.  WBC 13.1, hemoglobin 13.6, platelets 263.  Sodium 141, potassium 2.6, bicarbonate 25, serum creatinine 0.30.  LFTs unremarkable.  Troponin 4 >> 2.  BNP 109.  CT a chest 0 poor opacification with pulmonary arteries.  There is bibasilar consolidation with air bronchograms.  There are patchy and nodular infiltrates in the RUL, RML.  Lactic acid 1.7 >> 1.8.  PCT 12.78.  CT abdomen and pelvis was negative for bowel obstruction or inflammation.  There is an IUD in place in proper position.  There is no hydronephrosis.  The patient was started on vancomycin and cefepime initially.

## 2023-09-15 DIAGNOSIS — Z993 Dependence on wheelchair: Secondary | ICD-10-CM | POA: Diagnosis not present

## 2023-09-15 DIAGNOSIS — J9601 Acute respiratory failure with hypoxia: Secondary | ICD-10-CM | POA: Diagnosis not present

## 2023-09-15 DIAGNOSIS — A419 Sepsis, unspecified organism: Secondary | ICD-10-CM | POA: Insufficient documentation

## 2023-09-15 DIAGNOSIS — J189 Pneumonia, unspecified organism: Secondary | ICD-10-CM | POA: Diagnosis not present

## 2023-09-15 DIAGNOSIS — J181 Lobar pneumonia, unspecified organism: Secondary | ICD-10-CM | POA: Insufficient documentation

## 2023-09-15 DIAGNOSIS — G121 Other inherited spinal muscular atrophy: Secondary | ICD-10-CM

## 2023-09-15 LAB — BASIC METABOLIC PANEL
Anion gap: 14 (ref 5–15)
BUN: 10 mg/dL (ref 6–20)
CO2: 23 mmol/L (ref 22–32)
Calcium: 8.3 mg/dL — ABNORMAL LOW (ref 8.9–10.3)
Chloride: 106 mmol/L (ref 98–111)
Creatinine, Ser: <0.3 mg/dL — ABNORMAL LOW (ref 0.44–1.00)
Glucose, Bld: 89 mg/dL (ref 70–99)
Potassium: 4.3 mmol/L (ref 3.5–5.1)
Sodium: 143 mmol/L (ref 135–145)

## 2023-09-15 LAB — CBC WITH DIFFERENTIAL/PLATELET
Abs Immature Granulocytes: 0.09 10*3/uL — ABNORMAL HIGH (ref 0.00–0.07)
Basophils Absolute: 0 10*3/uL (ref 0.0–0.1)
Basophils Relative: 0 %
Eosinophils Absolute: 0 10*3/uL (ref 0.0–0.5)
Eosinophils Relative: 0 %
HCT: 39.5 % (ref 36.0–46.0)
Hemoglobin: 12.3 g/dL (ref 12.0–15.0)
Immature Granulocytes: 0 %
Lymphocytes Relative: 5 %
Lymphs Abs: 1.1 10*3/uL (ref 0.7–4.0)
MCH: 26.4 pg (ref 26.0–34.0)
MCHC: 31.1 g/dL (ref 30.0–36.0)
MCV: 84.8 fL (ref 80.0–100.0)
Monocytes Absolute: 0.4 10*3/uL (ref 0.1–1.0)
Monocytes Relative: 2 %
Neutro Abs: 18.5 10*3/uL — ABNORMAL HIGH (ref 1.7–7.7)
Neutrophils Relative %: 93 %
Platelets: 237 10*3/uL (ref 150–400)
RBC: 4.66 MIL/uL (ref 3.87–5.11)
RDW: 16 % — ABNORMAL HIGH (ref 11.5–15.5)
WBC: 20.1 10*3/uL — ABNORMAL HIGH (ref 4.0–10.5)
nRBC: 0 % (ref 0.0–0.2)

## 2023-09-15 LAB — PROCALCITONIN: Procalcitonin: 12.22 ng/mL

## 2023-09-15 LAB — MRSA NEXT GEN BY PCR, NASAL: MRSA by PCR Next Gen: NOT DETECTED

## 2023-09-15 LAB — GLUCOSE, CAPILLARY
Glucose-Capillary: 94 mg/dL (ref 70–99)
Glucose-Capillary: 99 mg/dL (ref 70–99)
Glucose-Capillary: 125 mg/dL — ABNORMAL HIGH (ref 70–99)
Glucose-Capillary: 125 mg/dL — ABNORMAL HIGH (ref 70–99)

## 2023-09-15 LAB — SARS CORONAVIRUS 2 BY RT PCR: SARS Coronavirus 2 by RT PCR: NEGATIVE

## 2023-09-15 MED ORDER — BUDESONIDE 0.5 MG/2ML IN SUSP
0.5000 mg | Freq: Two times a day (BID) | RESPIRATORY_TRACT | Status: DC
  Administered 2023-09-15 – 2023-09-16 (×3): 0.5 mg via RESPIRATORY_TRACT
  Filled 2023-09-15 (×3): qty 2

## 2023-09-15 MED ORDER — ORAL CARE MOUTH RINSE: 15.0000 mL | OROMUCOSAL | Status: DC | PRN

## 2023-09-15 NOTE — Plan of Care (Signed)

## 2023-09-15 NOTE — Plan of Care (Signed)
  Problem: Coping: Goal: Ability to adjust to condition or change in health will improve Outcome: Progressing   Problem: Fluid Volume: Goal: Ability to maintain a balanced intake and output will improve Outcome: Progressing   Problem: Education: Goal: Knowledge of General Education information will improve Description: Including pain rating scale, medication(s)/side effects and non-pharmacologic comfort measures Outcome: Progressing   

## 2023-09-15 NOTE — ED Notes (Signed)
ED TO INPATIENT HANDOFF REPORT  ED Nurse Name and Phone #: [phone]  S Name/Age/Gender Janet Acevedo 33 y.o. adult Room/Bed: APA08/APA08  Code Status   Code Status: Full Code  Home/SNF/Other Home Patient oriented to: self, place, time, and situation Is this baseline? Yes   Triage Complete: Triage complete  Chief Complaint Sepsis due to pneumonia (HCC) [J18.9, A41.9]  Triage Note Pt just diagnosed with flu and finished tamiflu. Now c/o chest pain when breathing.    Allergies Allergies  Allergen Reactions   Other Diarrhea   Ciprofloxacin Nausea And Vomiting    Level of Care/Admitting Diagnosis ED Disposition     ED Disposition  Admit   Condition  --   Comment  Hospital Area: [facility] [[id number]]  Level of Care: Telemetry [5]  Covid Evaluation: Asymptomatic - no recent exposure (last 10 days) testing not required  Diagnosis: Sepsis due to pneumonia [facility]) [[id number]]  Admitting Physician: Cleora Fleet [[id number]]  Attending Physician: Cleora Fleet [[id number]]  Certification:: I certify this patient will need inpatient services for at least 2 midnights  Expected Medical Readiness: 09/17/2023          B Medical/Surgery History Past Medical History:  Diagnosis Date   Allergy    Annual visit for general adult medical examination with abnormal findings 07/17/2022   Anxiety    Asthma    Depression, major, single episode, moderate (HCC) 09/05/2017   GERD (gastroesophageal reflux disease)    Hypertension 05/07/2021   Neuromuscular disorder (HCC)    Osteomyelitis (HCC) June 2011   Otitis externa, eczematoid 05/28/2019   Perennial allergic rhinitis    Seborrheic dermatitis of scalp 2006   Spinal muscle atrophy (HCC)    type 2/3 18 months   Type 2 diabetes mellitus with other specified complication (HCC) 02/19/2022   Past Surgical History:  Procedure Laterality Date   bilateral hip reinsertion  under age 22   hips out of socket, pt was only able to  cruise hold on and move short distances    BONE BIOPSY     spinal bone    MIRENA PLACED 10/27/10     SPINAL FUSION  1999   rods placed in thoracolumbar spine to excessive scoliosis primarily    SPINE SURGERY N/A    [employer] 07/29/2020     A IV Location/Drains/Wounds Patient Lines/Drains/Airways Status     Active Line/Drains/Airways     Name Placement date Placement time Site Days   Peripheral IV 09/14/23 18 G Left;Posterior Forearm 09/14/23  0749  Forearm  1   Peripheral IV 09/14/23 20 G 1.88" Right;Upper Arm 09/14/23  0852  Arm  1            Intake/Output Last 24 hours  Intake/Output Summary (Last 24 hours) at 09/15/2023 0221 Last data filed at 09/14/2023 1732 Gross per 24 hour  Intake 2937.83 ml  Output --  Net 2937.83 ml    Labs/Imaging Results for orders placed or performed during the hospital encounter of 09/14/23 (from the past 48 hours)  hCG, serum, qualitative     Status: None   Collection Time: 09/14/23  7:34 AM  Result Value Ref Range   Preg, Serum NEGATIVE NEGATIVE    Comment:        THE SENSITIVITY OF THIS METHODOLOGY IS >10 mIU/mL. Performed at [facility], [address], [place], [place] [address]   Comprehensive metabolic panel     Status: Abnormal   Collection Time: 09/14/23  7:37 AM  Result Value Ref Range   Sodium 141 135 - 145 mmol/L   Potassium 2.6 (LL) 3.5 - 5.1 mmol/L    Comment: CRITICAL RESULT CALLED TO, READ BACK BY AND VERIFIED WITH TALBOT T @ 0814 ON [id number] BY HENDERSON L   Chloride 103 98 - 111 mmol/L   CO2 25 22 - 32 mmol/L   Glucose, Bld 125 (H) 70 - 99 mg/dL    Comment: Glucose reference range applies only to samples taken after fasting for at least 8 hours.   BUN 13 6 - 20 mg/dL   Creatinine, Ser <7.84 (L) 0.44 - 1.00 mg/dL   Calcium 8.7 (L) 8.9 - 10.3 mg/dL   Total Protein 8.3 (H) 6.5 - 8.1 g/dL   Albumin 3.8 3.5 - 5.0 g/dL   AST 13 (L) 15 - 41 U/L   ALT 17 0 - 44 U/L   Alkaline Phosphatase 58 38 - 126 U/L   Total  Bilirubin 1.4 (H) 0.0 - 1.2 mg/dL   GFR, Estimated NOT CALCULATED >60 mL/min    Comment: (NOTE) Calculated using the CKD-EPI Creatinine Equation (2021)    Anion gap 13 5 - 15    Comment: Performed at [facility], [address], [place], [place] [address]  CBC with Differential     Status: Abnormal   Collection Time: 09/14/23  7:37 AM  Result Value Ref Range   WBC 32.1 (H) 4.0 - 10.5 K/uL   RBC 5.01 3.87 - 5.11 MIL/uL   Hemoglobin 13.6 12.0 - 15.0 g/dL   HCT 52.8 41.3 - 24.4 %   MCV 84.6 80.0 - 100.0 fL   MCH 27.1 26.0 - 34.0 pg   MCHC 32.1 30.0 - 36.0 g/dL   RDW 01.0 (H) 27.2 - 53.6 %   Platelets 263 150 - 400 K/uL   nRBC 0.0 0.0 - 0.2 %   Neutrophils Relative % 85 %   Neutro Abs 27.1 (H) 1.7 - 7.7 K/uL   Lymphocytes Relative 7 %   Lymphs Abs 2.2 0.7 - 4.0 K/uL   Monocytes Relative 7 %   Monocytes Absolute 2.3 (H) 0.1 - 1.0 K/uL   Eosinophils Relative 0 %   Eosinophils Absolute 0.0 0.0 - 0.5 K/uL   Basophils Relative 0 %   Basophils Absolute 0.1 0.0 - 0.1 K/uL   WBC Morphology MORPHOLOGY UNREMARKABLE     Comment: See Note LEUKOCYTOSIS    RBC Morphology MORPHOLOGY UNREMARKABLE    Smear Review PLATELETS APPEAR ADEQUATE     Comment: PLATELET COUNT CONFIRMED BY SMEAR   Immature Granulocytes 1 %   Abs Immature Granulocytes 0.37 (H) 0.00 - 0.07 K/uL    Comment: Performed at [facility], [address], [place], [place] [address]  Troponin I (High Sensitivity)     Status: None   Collection Time: 09/14/23  7:37 AM  Result Value Ref Range   Troponin I (High Sensitivity) 4 <18 ng/L    Comment: (NOTE) Elevated high sensitivity troponin I (hsTnI) values and significant  changes across serial measurements may suggest ACS but many other  chronic and acute conditions are known to elevate hsTnI results.  Refer to the "Links" section for chest pain algorithms and additional  guidance. Performed at [facility], [address], [place], [place] [address]   Brain natriuretic peptide      Status: Abnormal   Collection Time: 09/14/23  7:37 AM  Result Value Ref Range   B Natriuretic Peptide 109.0 (H) 0.0 - 100.0 pg/mL  Comment: Performed at [facility], [address], [place], [place] [address]  Lactic acid, plasma     Status: None   Collection Time: 09/14/23  7:37 AM  Result Value Ref Range   Lactic Acid, Venous 1.7 0.5 - 1.9 mmol/L    Comment: Performed at [facility], [address] [place], [place] [address]  Lactic acid, plasma     Status: None   Collection Time: 09/14/23  8:55 AM  Result Value Ref Range   Lactic Acid, Venous 1.8 0.5 - 1.9 mmol/L    Comment: Performed at [facility], [address], [place], [place] [address]  Troponin I (High Sensitivity)     Status: None   Collection Time: 09/14/23  8:55 AM  Result Value Ref Range   Troponin I (High Sensitivity) 2 <18 ng/L    Comment: (NOTE) Elevated high sensitivity troponin I (hsTnI) values and significant  changes across serial measurements may suggest ACS but many other  chronic and acute conditions are known to elevate hsTnI results.  Refer to the "Links" section for chest pain algorithms and additional  guidance. Performed at [facility], [address] [place], [place] [address]   Hemoglobin A1c     Status: Abnormal   Collection Time: 09/14/23 12:54 PM  Result Value Ref Range   Hgb A1c MFr Bld 5.8 (H) 4.8 - 5.6 %    Comment: (NOTE) Pre diabetes:          5.7%-6.4%  Diabetes:              >6.4%  Glycemic control for   <7.0% adults with diabetes    Mean Plasma Glucose 119.76 mg/dL    Comment: Performed at [facility], [address] [place], [place] [address]  Procalcitonin     Status: None   Collection Time: 09/14/23 12:54 PM  Result Value Ref Range   Procalcitonin 12.78 ng/mL    Comment:        Interpretation: PCT >= 10 ng/mL: Important systemic inflammatory response, almost exclusively due to severe bacterial sepsis or septic shock. (NOTE)       Sepsis PCT Algorithm            Lower Respiratory Tract                                      Infection PCT Algorithm    ----------------------------     ----------------------------         PCT < 0.25 ng/mL                PCT < 0.10 ng/mL          Strongly encourage             Strongly discourage   discontinuation of antibiotics    initiation of antibiotics    ----------------------------     -----------------------------       PCT 0.25 - 0.50 ng/mL            PCT 0.10 - 0.25 ng/mL               OR       >80% decrease in PCT            Discourage initiation of  antibiotics      Encourage discontinuation           of antibiotics    ----------------------------     -----------------------------         PCT >= 0.50 ng/mL              PCT 0.26 - 0.50 ng/mL                AND       <80% decrease in PCT             Encourage initiation of                                             antibiotics       Encourage continuation           of antibiotics    ----------------------------     -----------------------------        PCT >= 0.50 ng/mL                  PCT > 0.50 ng/mL               AND         increase in PCT                  Strongly encourage                                      initiation of antibiotics    Strongly encourage escalation           of antibiotics                                     -----------------------------                                           PCT <= 0.25 ng/mL                                                 OR                                        > 80% decrease in PCT                                      Discontinue / Do not initiate                                             antibiotics  Performed at [facility], [address], [place], [place] [address]   CBG monitoring, ED     Status: None   Collection Time: 09/14/23  4:36 PM  Result Value Ref Range   Glucose-Capillary 75 70 - 99 mg/dL    Comment: Glucose reference range applies  only to samples taken after fasting for at least 8 hours.  CBG monitoring, ED     Status: None   Collection Time: 09/14/23 10:13 PM  Result Value Ref Range   Glucose-Capillary 95 70 - 99 mg/dL    Comment: Glucose reference range applies only to samples taken after fasting for at least 8 hours.   CT ABDOMEN PELVIS W CONTRAST Result Date: 09/14/2023 CLINICAL DATA:  Acute abdominal pain EXAM: CT ABDOMEN AND PELVIS WITH CONTRAST TECHNIQUE: Multidetector CT imaging of the abdomen and pelvis was performed using the standard protocol following bolus administration of intravenous contrast. RADIATION DOSE REDUCTION: This exam was performed according to the departmental dose-optimization program which includes automated exposure control, adjustment of the mA and/or kV according to patient size and/or use of iterative reconstruction technique. CONTRAST:  OMNIPAQUE IOHEXOL 350 MG/ML SOLN COMPARISON:  None Available. FINDINGS: Lower Chest: Bilateral lower lobe airspace disease. Hepatobiliary: No suspicious hepatic masses identified. Mild diffuse hepatic steatosis noted. Tiny gallstone seen. No signs of cholecystitis or biliary ductal dilatation. Pancreas:  No mass or inflammatory changes. Spleen: Within normal limits in size and appearance. Adrenals/Urinary Tract: No suspicious masses identified. No evidence of ureteral calculi or hydronephrosis. Stomach/Bowel: No evidence of obstruction, inflammatory process or abnormal fluid collections. Normal appendix visualized. Vascular/Lymphatic: No pathologically enlarged lymph nodes. No acute vascular findings. Reproductive: IUD seen in expected position. No mass or other significant abnormality. Other:  None. Musculoskeletal: No suspicious bone lesions identified. Posterior spinal fixation rods noted as well as internal fixation hardware in both proximal femurs. IMPRESSION: Bilateral lower lobe airspace disease, suspicious for pneumonia. No acute findings within the  abdomen or pelvis. Mild hepatic steatosis. Cholelithiasis. No radiographic evidence of cholecystitis. Electronically Signed   By: Danae Orleans M.D.   On: 09/14/2023 12:15   CT Angio Chest PE W and/or Wo Contrast Result Date: 09/14/2023 CLINICAL DATA:  Pleuritic chest pain. High probability for pulmonary embolism. EXAM: CT ANGIOGRAPHY CHEST WITH CONTRAST TECHNIQUE: Multidetector CT imaging of the chest was performed using the standard protocol during bolus administration of intravenous contrast. Multiplanar CT image reconstructions and MIPs were obtained to evaluate the vascular anatomy. RADIATION DOSE REDUCTION: This exam was performed according to the departmental dose-optimization program which includes automated exposure control, adjustment of the mA and/or kV according to patient size and/or use of iterative reconstruction technique. CONTRAST:  OMNIPAQUE IOHEXOL 350 MG/ML SOLN COMPARISON:  None Available. FINDINGS: Cardiovascular: Very poor opacification of pulmonary arteries is seen on this study, and pulmonary embolism cannot be excluded by this exam. No evidence of thoracic aortic dissection or aneurysm. Mediastinum/Nodes: Mild bilateral hilar lymphadenopathy and mild subcarinal mediastinal lymphadenopathy are seen. Lungs/Pleura: Bilateral lower lobe consolidation with air bronchograms noted as well as patchy and nodular infiltrates in the upper and right middle lobes. This is consistent with infectious or inflammatory etiology. No pleural effusion. Upper abdomen: Hepatic steatosis noted. Musculoskeletal: No suspicious bone lesions identified. Thoracolumbar posterior spinal fixation rods noted. Review of the MIP images confirms the above findings. IMPRESSION: Very poor opacification of pulmonary arteries on this study, and pulmonary embolism cannot be excluded . Bilateral lower lobe consolidation with air bronchograms, with patchy and nodular infiltrates in the upper and right middle lobes. This is  consistent with infectious or inflammatory etiology. Mild bilateral hilar and subcarinal mediastinal lymphadenopathy, which may be reactive in  etiology. Hepatic steatosis. Electronically Signed   By: Danae Orleans M.D.   On: 09/14/2023 11:55   DG Chest Portable 1 View Result Date: 09/14/2023 CLINICAL DATA:  Chest pain and shortness of breath. EXAM: PORTABLE CHEST 1 VIEW COMPARISON:  05/09/2021 FINDINGS: Cardiopericardial silhouette is at upper limits of normal for size. Prominent bronchovascular markings as before with no focal consolidation or substantial pleural effusion. Extensive thoracolumbar fusion hardware evident. Bones are diffusely demineralized. IMPRESSION: Stable.  No new or acute interval findings. Electronically Signed   By: Kennith Center M.D.   On: 09/14/2023 06:44    Pending Labs Unresulted Labs (From admission, onward)     Start     Ordered   09/21/23 0500  Creatinine, serum  (enoxaparin (LOVENOX)    CrCl >/= 30 ml/min)  Weekly,   R     Comments: while on enoxaparin therapy    09/14/23 1235   09/15/23 0500  Basic metabolic panel  Daily,   R      09/14/23 1235   09/15/23 0500  CBC with Differential/Platelet  Daily,   R      09/14/23 1235   09/15/23 0500  Procalcitonin  Tomorrow morning,   R       References:    Procalcitonin Lower Respiratory Tract Infection AND Sepsis Procalcitonin Algorithm   09/14/23 1235   09/14/23 1343  Strep pneumoniae urinary antigen  Once,   R        09/14/23 1342   09/14/23 1343  Legionella Pneumophila Serogp 1 Ur Ag  Once,   R        09/14/23 1342   09/14/23 1314  Expectorated Sputum Assessment w Gram Stain, Rflx to Resp Cult  ONCE - URGENT,   URGENT        09/14/23 1313   09/14/23 0625  Blood culture (routine x 2)  BLOOD CULTURE X 2,   R (with STAT occurrences)      09/14/23 0624            Vitals/Pain Today's Vitals   09/15/23 0000 09/15/23 0015 09/15/23 0100 09/15/23 0200  BP: (!) 126/94  (!) 120/90 (!) 132/96  Pulse: 100 92 87 95   Resp: (!) 23 (!) 21 17 (!) 23  Temp:      TempSrc:      SpO2: 95% 95% 96% 95%  Weight:      PainSc:        Isolation Precautions No active isolations  Medications Medications  potassium chloride SA (KLOR-CON M) CR tablet 40 mEq (40 mEq Oral Patient Refused/Not Given 09/14/23 0836)  0.9 %  sodium chloride infusion ( Intravenous Rate/Dose Verify 09/15/23 0143)  insulin aspart (novoLOG) injection 0-9 Units (0 Units Subcutaneous Not Given 09/14/23 1702)  insulin aspart (novoLOG) injection 3 Units (3 Units Subcutaneous Given 09/14/23 1839)  enoxaparin (LOVENOX) injection 40 mg (40 mg Subcutaneous Given 09/14/23 2151)  cefTRIAXone (ROCEPHIN) 2 g in sodium chloride 0.9 % 100 mL IVPB (0 g Intravenous Stopped 09/14/23 2136)  doxycycline (VIBRAMYCIN) 100 mg in sodium chloride 0.9 % 250 mL IVPB (has no administration in time range)  acetaminophen (TYLENOL) tablet 650 mg (has no administration in time range)    Or  acetaminophen (TYLENOL) suppository 650 mg (has no administration in time range)  oxyCODONE (Oxy IR/ROXICODONE) immediate release tablet 5 mg (has no administration in time range)  fentaNYL (SUBLIMAZE) injection 12.5 mcg (has no administration in time range)  traZODone (DESYREL) tablet 25 mg (has no administration  in time range)  bisacodyl (DULCOLAX) EC tablet 5 mg (has no administration in time range)  prochlorperazine (COMPAZINE) injection 10 mg (has no administration in time range)  pantoprazole (PROTONIX) EC tablet 40 mg (40 mg Oral Given 09/14/23 1831)  guaiFENesin (MUCINEX) 12 hr tablet 1,200 mg (1,200 mg Oral Not Given 09/14/23 2151)  dextromethorphan (DELSYM) 30 MG/5ML liquid 30 mg (has no administration in time range)  budesonide (PULMICORT) nebulizer solution 0.25 mg (0.25 mg Nebulization Given 09/14/23 2036)  busPIRone (BUSPAR) tablet 7.5 mg (7.5 mg Oral Not Given 09/14/23 2151)  cromolyn (OPTICROM) 4 % ophthalmic solution 1 drop (1 drop Both Eyes Not Given 09/14/23 2202)   montelukast (SINGULAIR) tablet 10 mg (10 mg Oral Given 09/14/23 2151)  nystatin (MYCOSTATIN/NYSTOP) topical powder (has no administration in time range)  irbesartan (AVAPRO) tablet 37.5 mg (has no administration in time range)  rosuvastatin (CRESTOR) tablet 5 mg (5 mg Oral Given 09/14/23 1831)  arformoterol (BROVANA) nebulizer solution 15 mcg (15 mcg Nebulization Given 09/14/23 2036)  loratadine (CLARITIN) tablet 10 mg (has no administration in time range)  sertraline (ZOLOFT) tablet 50 mg (50 mg Oral Given 09/14/23 2151)  ipratropium-albuterol (DUONEB) 0.5-2.5 (3) MG/3ML nebulizer solution 3 mL (has no administration in time range)  metoprolol succinate (TOPROL-XL) 24 hr tablet 25 mg (25 mg Oral Given 09/14/23 2318)  ondansetron (ZOFRAN) injection 4 mg (4 mg Intravenous Given 09/14/23 0750)  lactated ringers bolus 1,000 mL (0 mLs Intravenous Stopped 09/14/23 0935)  potassium chloride 10 mEq in 100 mL IVPB (0 mEq Intravenous Stopped 09/14/23 1633)  ceFEPIme (MAXIPIME) 2 g in sodium chloride 0.9 % 100 mL IVPB (0 g Intravenous Stopped 09/14/23 0935)  metroNIDAZOLE (FLAGYL) IVPB 500 mg (0 mg Intravenous Stopped 09/14/23 1119)  acetaminophen (TYLENOL) tablet 650 mg (650 mg Oral Given 09/14/23 0840)  vancomycin (VANCOREADY) IVPB 1500 mg/300 mL (0 mg Intravenous Stopped 09/14/23 1412)  iohexol (OMNIPAQUE) 350 MG/ML injection 100 mL (100 mLs Intravenous Contrast Given 09/14/23 0941)  ipratropium-albuterol (DUONEB) 0.5-2.5 (3) MG/3ML nebulizer solution (  Given 09/14/23 1305)  dexamethasone (DECADRON) injection 10 mg (10 mg Intravenous Given 09/14/23 1538)    Mobility non-ambulatory     Focused Assessments     R Recommendations: See Admitting Provider Note  Report given to:   Additional Notes: A&O  / purewick / [place] IV RUA / mom at bedside / uses a hoyer loft at home / meds whole with H2O / on RA

## 2023-09-15 NOTE — Progress Notes (Signed)
PROGRESS NOTE  Janet Acevedo NFA:[id number] DOB: [birth year] DOA: 09/14/2023 PCP: Kerri Perches, MD  Brief History:  33 year old nonbinary female with restrictive lung disease followed by pulmonary in [place], hypertension, controlled type 2 diabetes mellitus, type II spinal muscular atrophy (wheelchair-bound), dyslipidemia, chronic leukocytosis, generalized anxiety disorder, cholelithiasis, GERD presenting with coughing, shortness of breath, chest congestion, and malaise for at least the last 3 days.  The patient had a virtual visit on 09/04/2023 with flulike symptoms.  She was placed on a 5-day course of oseltamavir.  She had stated that she had a negative home test for influenza and COVID apparently.  She states that she initially felt better, but worsened again with the above symptoms.  He had some associated dry heaving but denied any diarrhea.  She did have some abdominal discomfort..  Passing flatus.  Last bowel movement was 2 days prior to admission.  In the ED, the patient was febrile to 100.6 F and tachycardic in the 120 range.  She was hemodynamically stable.  Oxygen saturation was 89% room air.  She was placed on 2 L with saturation up to 97%.  WBC 13.1, hemoglobin 13.6, platelets 263.  Sodium 141, potassium 2.6, bicarbonate 25, serum creatinine 0.30.  LFTs unremarkable.  Troponin 4 >> 2.  BNP 109.  CT a chest 0 poor opacification with pulmonary arteries.  There is bibasilar consolidation with air bronchograms.  There are patchy and nodular infiltrates in the RUL, RML.  Lactic acid 1.7 >> 1.8.  PCT 12.78.  CT abdomen and pelvis was negative for bowel obstruction or inflammation.  There is an IUD in place in proper position.  There is no hydronephrosis.  The patient was started on vancomycin and cefepime initially.    Assessment/Plan: Sepsis -Present on admission -Presented with fever, tachycardia, leukocytosis -Secondary to pneumonia -Lactic acid peaked at 1.8 -PCT  12.78 -Continue ceftriaxone and doxycycline  Acute respiratory failure with hypoxia -Stable on 2 L nasal cannula -Wean oxygen as tolerated -Continue Brovana -Continue Pulmicort -Continue albuterol  Lobar pneumonia -continue ceftriaxone and doxy -MRSA screen -check COVID 19  Controlled diabetes mellitus type 2 -NovoLog sliding scale -Holding Jardiance in the setting of infection -2/18 hemoglobin A1c 5.8  Essential hypertension -Continue ARB equivalent and metoprolol succinate  Restrictive lung disease -Continue home dose Singulair -Symbicort at home  Anxiety -Continue BuSpar ans zoloft  Mixed hyperlipidemia -Continue statin  Type II spinal muscular atrophy -Wheelchair bound at baseline   Hepatic steatosis  - incidental finding on CT AP  Hypokalemia -Replete -Check magnesium        Family Communication:   mother  at bedside 2/19  Consultants: none   Code Status:  FULL   DVT Prophylaxis:  [place] Lovenox   Procedures: As Listed in Progress Note Above  Antibiotics: Ceftriaxone 2/18>> Doxy 2/19>>     Subjective: Patient states that she is breathing better this morning.  She still has a nonproductive cough.  She has chest congestion.  She has nausea without emesis.  She has some abdominal discomfort.  She is passing flatus but there is no bowel movement.  She denies any hemoptysis.  There is no diarrhea hematochezia, melena.  Objective: Vitals:   09/15/23 0100 09/15/23 0200 09/15/23 0347 09/15/23 0443  BP: (!) 120/90 (!) 132/96 (!) 134/92 (!) 134/92  Pulse: 87 95 88 88  Resp: 17 (!) 23 20 20   Temp:   98.9 F (37.2 C) 98.9 F (37.2  C)  TempSrc:   Axillary Oral  SpO2: 96% 95% 97%   Weight:    68.9 kg  Height:    5' (1.524 m)    Intake/Output Summary (Last 24 hours) at 09/15/2023 0707 Last data filed at 09/15/2023 0410 Gross per 24 hour  Intake 3123.47 ml  Output --  Net 3123.47 ml   Weight change:  Exam:  General:  Pt is alert, follows  commands appropriately, not in acute distress HEENT: No icterus, No thrush, No neck mass, [place]/AT Cardiovascular: RRR, S1/S2, no rubs, no gallops Respiratory: Bilateral rhonchi.  No wheezing. Abdomen: Soft/+BS, non tender, non distended, no guarding Extremities: 1 + LE edema, No lymphangitis, No petechiae, No rashes, no synovitis   Data Reviewed: I have personally reviewed following labs and imaging studies Basic Metabolic Panel: Recent Labs  Lab 09/14/23 0737 09/15/23 0307  NA 141 143  K 2.6* 4.3  CL 103 106  CO2 25 23  GLUCOSE 125* 89  BUN 13 10  CREATININE <0.30* <0.30*  CALCIUM 8.7* 8.3*   Liver Function Tests: Recent Labs  Lab 09/14/23 0737  AST 13*  ALT 17  ALKPHOS 58  BILITOT 1.4*  PROT 8.3*  ALBUMIN 3.8   No results for input(s): "LIPASE", "AMYLASE" in the last 168 hours. No results for input(s): "AMMONIA" in the last 168 hours. Coagulation Profile: No results for input(s): "INR", "PROTIME" in the last 168 hours. CBC: Recent Labs  Lab 09/14/23 0737 09/15/23 0307  WBC 32.1* 20.1*  NEUTROABS 27.1* 18.5*  HGB 13.6 12.3  HCT 42.4 39.5  MCV 84.6 84.8  PLT 263 237   Cardiac Enzymes: No results for input(s): "CKTOTAL", "CKMB", "CKMBINDEX", "TROPONINI" in the last 168 hours. BNP: Invalid input(s): "POCBNP" CBG: Recent Labs  Lab 09/14/23 1636 09/14/23 2213  GLUCAP 75 95   HbA1C: Recent Labs    09/14/23 1254  HGBA1C 5.8*   Urine analysis:    Component Value Date/Time   COLORURINE DARK YELLOW 10/09/2016 1602   APPEARANCEUR CLEAR 10/09/2016 1602   LABSPEC 1.022 10/09/2016 1602   PHURINE 6.0 10/09/2016 1602   GLUCOSEU NEGATIVE 10/09/2016 1602   HGBUR 3+ (A) 10/09/2016 1602   BILIRUBINUR negative 10/13/2019 1143   BILIRUBINUR negative 05/22/2015 1449   KETONESUR negative 10/13/2019 1143   KETONESUR NEGATIVE 10/09/2016 1602   PROTEINUR 1+ (A) 10/09/2016 1602   UROBILINOGEN 0.2 10/13/2019 1143   UROBILINOGEN 1 05/10/2013 0941   NITRITE  Negative 10/13/2019 1143   NITRITE NEGATIVE 10/09/2016 1602   LEUKOCYTESUR Negative 10/13/2019 1143   Sepsis Labs: @LABRCNTIP (procalcitonin:4,lacticidven:4) ) Recent Results (from the past 240 hours)  Blood culture (routine x 2)     Status: None (Preliminary result)   Collection Time: 09/14/23  7:35 AM   Specimen: BLOOD  Result Value Ref Range Status   Specimen Description BLOOD BLOOD RIGHT HAND  Final   Special Requests   Final    BOTTLES DRAWN AEROBIC AND ANAEROBIC Blood Culture adequate volume   Culture   Final    NO GROWTH < 24 HOURS Performed at [facility], [address] [place], [place] [address]    Report Status PENDING  Incomplete  Blood culture (routine x 2)     Status: None (Preliminary result)   Collection Time: 09/14/23  7:37 AM   Specimen: BLOOD  Result Value Ref Range Status   Specimen Description BLOOD BLOOD RIGHT ARM  Final   Special Requests   Final    BOTTLES DRAWN AEROBIC ONLY Blood Culture results  may not be optimal due to an inadequate volume of blood received in culture bottles   Culture   Final    NO GROWTH < 24 HOURS Performed at [facility], [address], [place], [place] [address]    Report Status PENDING  Incomplete     Scheduled Meds:  arformoterol  15 mcg Nebulization BID   budesonide (PULMICORT) nebulizer solution  0.5 mg Nebulization BID   busPIRone  7.5 mg Oral BID   cromolyn  1 drop Both Eyes QID   enoxaparin (LOVENOX) injection  40 mg Subcutaneous Q24H   guaiFENesin  1,200 mg Oral BID   insulin aspart  0-9 Units Subcutaneous TID WC   insulin aspart  3 Units Subcutaneous TID WC   ipratropium-albuterol  3 mL Nebulization TID   irbesartan  37.5 mg Oral Daily   loratadine  10 mg Oral Daily   metoprolol succinate  25 mg Oral QHS   montelukast  10 mg Oral QHS   pantoprazole  40 mg Oral QPM   potassium chloride SA  40 mEq Oral Once   rosuvastatin  5 mg Oral QPM   sertraline  50 mg Oral QHS   Continuous Infusions:  sodium chloride  75 mL/hr at 09/15/23 0143   cefTRIAXone (ROCEPHIN)  IV Stopped (09/14/23 2136)   doxycycline (VIBRAMYCIN) IV      Procedures/Studies: CT ABDOMEN PELVIS W CONTRAST Result Date: 09/14/2023 CLINICAL DATA:  Acute abdominal pain EXAM: CT ABDOMEN AND PELVIS WITH CONTRAST TECHNIQUE: Multidetector CT imaging of the abdomen and pelvis was performed using the standard protocol following bolus administration of intravenous contrast. RADIATION DOSE REDUCTION: This exam was performed according to the departmental dose-optimization program which includes automated exposure control, adjustment of the mA and/or kV according to patient size and/or use of iterative reconstruction technique. CONTRAST:  OMNIPAQUE IOHEXOL 350 MG/ML SOLN COMPARISON:  None Available. FINDINGS: Lower Chest: Bilateral lower lobe airspace disease. Hepatobiliary: No suspicious hepatic masses identified. Mild diffuse hepatic steatosis noted. Tiny gallstone seen. No signs of cholecystitis or biliary ductal dilatation. Pancreas:  No mass or inflammatory changes. Spleen: Within normal limits in size and appearance. Adrenals/Urinary Tract: No suspicious masses identified. No evidence of ureteral calculi or hydronephrosis. Stomach/Bowel: No evidence of obstruction, inflammatory process or abnormal fluid collections. Normal appendix visualized. Vascular/Lymphatic: No pathologically enlarged lymph nodes. No acute vascular findings. Reproductive: IUD seen in expected position. No mass or other significant abnormality. Other:  None. Musculoskeletal: No suspicious bone lesions identified. Posterior spinal fixation rods noted as well as internal fixation hardware in both proximal femurs. IMPRESSION: Bilateral lower lobe airspace disease, suspicious for pneumonia. No acute findings within the abdomen or pelvis. Mild hepatic steatosis. Cholelithiasis. No radiographic evidence of cholecystitis. Electronically Signed   By: Danae Orleans M.D.   On: 09/14/2023  12:15   CT Angio Chest PE W and/or Wo Contrast Result Date: 09/14/2023 CLINICAL DATA:  Pleuritic chest pain. High probability for pulmonary embolism. EXAM: CT ANGIOGRAPHY CHEST WITH CONTRAST TECHNIQUE: Multidetector CT imaging of the chest was performed using the standard protocol during bolus administration of intravenous contrast. Multiplanar CT image reconstructions and MIPs were obtained to evaluate the vascular anatomy. RADIATION DOSE REDUCTION: This exam was performed according to the departmental dose-optimization program which includes automated exposure control, adjustment of the mA and/or kV according to patient size and/or use of iterative reconstruction technique. CONTRAST:  OMNIPAQUE IOHEXOL 350 MG/ML SOLN COMPARISON:  None Available. FINDINGS: Cardiovascular: Very poor opacification of pulmonary arteries is seen  on this study, and pulmonary embolism cannot be excluded by this exam. No evidence of thoracic aortic dissection or aneurysm. Mediastinum/Nodes: Mild bilateral hilar lymphadenopathy and mild subcarinal mediastinal lymphadenopathy are seen. Lungs/Pleura: Bilateral lower lobe consolidation with air bronchograms noted as well as patchy and nodular infiltrates in the upper and right middle lobes. This is consistent with infectious or inflammatory etiology. No pleural effusion. Upper abdomen: Hepatic steatosis noted. Musculoskeletal: No suspicious bone lesions identified. Thoracolumbar posterior spinal fixation rods noted. Review of the MIP images confirms the above findings. IMPRESSION: Very poor opacification of pulmonary arteries on this study, and pulmonary embolism cannot be excluded . Bilateral lower lobe consolidation with air bronchograms, with patchy and nodular infiltrates in the upper and right middle lobes. This is consistent with infectious or inflammatory etiology. Mild bilateral hilar and subcarinal mediastinal lymphadenopathy, which may be reactive in etiology. Hepatic  steatosis. Electronically Signed   By: Danae Orleans M.D.   On: 09/14/2023 11:55   DG Chest Portable 1 View Result Date: 09/14/2023 CLINICAL DATA:  Chest pain and shortness of breath. EXAM: PORTABLE CHEST 1 VIEW COMPARISON:  05/09/2021 FINDINGS: Cardiopericardial silhouette is at upper limits of normal for size. Prominent bronchovascular markings as before with no focal consolidation or substantial pleural effusion. Extensive thoracolumbar fusion hardware evident. Bones are diffusely demineralized. IMPRESSION: Stable.  No new or acute interval findings. Electronically Signed   By: Kennith Center M.D.   On: 09/14/2023 06:44    Catarina Hartshorn, DO  [facility]  If 7PM-7AM, please contact night-coverage [email or web address] Password [email or web address] 09/15/2023, 7:07 AM   LOS: 1 day

## 2023-09-15 NOTE — Progress Notes (Signed)
   09/15/23 1025  TOC Brief Assessment  Insurance and Status Reviewed  Patient has primary care physician Yes  Home environment has been reviewed Home with parents  Prior level of function: Wheel Chair bound  Prior/Current Home Services No current home services  Social Drivers of Health Review SDOH reviewed no interventions necessary  Readmission risk has been reviewed Yes  Transition of care needs no transition of care needs at this time   Transition of Care Department [facility]) has reviewed patient and no TOC needs have been identified at this time. We will continue to monitor patient advancement through interdisciplinary progression rounds. If new patient transition needs arise, please place a TOC consult.

## 2023-09-16 DIAGNOSIS — J189 Pneumonia, unspecified organism: Secondary | ICD-10-CM | POA: Diagnosis not present

## 2023-09-16 DIAGNOSIS — J181 Lobar pneumonia, unspecified organism: Secondary | ICD-10-CM | POA: Diagnosis not present

## 2023-09-16 DIAGNOSIS — G121 Other inherited spinal muscular atrophy: Secondary | ICD-10-CM | POA: Diagnosis not present

## 2023-09-16 DIAGNOSIS — J9601 Acute respiratory failure with hypoxia: Secondary | ICD-10-CM | POA: Diagnosis not present

## 2023-09-16 LAB — CBC WITH DIFFERENTIAL/PLATELET
Abs Immature Granulocytes: 0.07 K/uL (ref 0.00–0.07)
Basophils Absolute: 0 K/uL (ref 0.0–0.1)
Basophils Relative: 0 %
Eosinophils Absolute: 0 K/uL (ref 0.0–0.5)
Eosinophils Relative: 0 %
HCT: 34.8 % — ABNORMAL LOW (ref 36.0–46.0)
Hemoglobin: 11.4 g/dL — ABNORMAL LOW (ref 12.0–15.0)
Immature Granulocytes: 1 %
Lymphocytes Relative: 14 %
Lymphs Abs: 1.7 K/uL (ref 0.7–4.0)
MCH: 27.5 pg (ref 26.0–34.0)
MCHC: 32.8 g/dL (ref 30.0–36.0)
MCV: 84.1 fL (ref 80.0–100.0)
Monocytes Absolute: 0.8 K/uL (ref 0.1–1.0)
Monocytes Relative: 7 %
Neutro Abs: 9.4 K/uL — ABNORMAL HIGH (ref 1.7–7.7)
Neutrophils Relative %: 78 %
Platelets: 239 K/uL (ref 150–400)
RBC: 4.14 MIL/uL (ref 3.87–5.11)
RDW: 16.1 % — ABNORMAL HIGH (ref 11.5–15.5)
WBC: 12.1 K/uL — ABNORMAL HIGH (ref 4.0–10.5)
nRBC: 0 % (ref 0.0–0.2)

## 2023-09-16 LAB — BASIC METABOLIC PANEL
Anion gap: 8 (ref 5–15)
BUN: 7 mg/dL (ref 6–20)
CO2: 24 mmol/L (ref 22–32)
Calcium: 7.7 mg/dL — ABNORMAL LOW (ref 8.9–10.3)
Chloride: 107 mmol/L (ref 98–111)
Creatinine, Ser: <0.3 mg/dL — ABNORMAL LOW (ref 0.44–1.00)
Glucose, Bld: 95 mg/dL (ref 70–99)
Potassium: 3.1 mmol/L — ABNORMAL LOW (ref 3.5–5.1)
Sodium: 139 mmol/L (ref 135–145)

## 2023-09-16 LAB — GLUCOSE, CAPILLARY
Glucose-Capillary: 81 mg/dL (ref 70–99)
Glucose-Capillary: 100 mg/dL — ABNORMAL HIGH (ref 70–99)

## 2023-09-16 LAB — MAGNESIUM: Magnesium: 1.5 mg/dL — ABNORMAL LOW (ref 1.7–2.4)

## 2023-09-16 MED ORDER — POTASSIUM CHLORIDE 20 MEQ PO PACK
40.0000 meq | PACK | Freq: Once | ORAL | Status: AC
  Administered 2023-09-16: 40 meq via ORAL
  Filled 2023-09-16: qty 2

## 2023-09-16 MED ORDER — CEFDINIR 300 MG PO CAPS: 300.0000 mg | ORAL_CAPSULE | Freq: Two times a day (BID) | ORAL | 0 refills | Status: DC

## 2023-09-16 MED ORDER — MAGNESIUM SULFATE 2 GM/50ML IV SOLN
2.0000 g | Freq: Once | INTRAVENOUS | Status: AC
Start: 2023-09-16 — End: 2023-09-16
  Administered 2023-09-16: 2 g via INTRAVENOUS
  Filled 2023-09-16: qty 50

## 2023-09-16 MED ORDER — DOXYCYCLINE HYCLATE 100 MG PO TABS: 100.0000 mg | ORAL_TABLET | Freq: Two times a day (BID) | ORAL | 0 refills | Status: DC

## 2023-09-16 MED ORDER — DOXYCYCLINE HYCLATE 100 MG PO TABS: 100.0000 mg | ORAL_TABLET | Freq: Two times a day (BID) | ORAL | Status: DC

## 2023-09-16 MED ORDER — POTASSIUM CHLORIDE CRYS ER 20 MEQ PO TBCR
40.0000 meq | EXTENDED_RELEASE_TABLET | Freq: Once | ORAL | Status: DC
  Filled 2023-09-16: qty 2

## 2023-09-16 NOTE — Care Management Important Message (Signed)
Important Message  Patient Details  Name: Janet Acevedo MRN: [id number] Date of Birth: [birth year]   Important Message Given:  N/A - LOS <3 / Initial given by admissions     Corey Harold 09/16/2023, 2:35 PM

## 2023-09-16 NOTE — Discharge Summary (Signed)
Physician Discharge Summary   Patient: Janet Acevedo MRN: [id number] DOB: 10/09/1990  Admit date:     09/14/2023  Discharge date: 09/16/23  Discharge Physician: Onalee Hua Starling Christofferson   PCP: Kerri Perches, MD   Recommendations at discharge:   Please follow up with primary care provider within 1-2 weeks  Please repeat BMP and CBC in one week  Hospital Course: 33 year old nonbinary female with restrictive lung disease followed by pulmonary in [place], hypertension, controlled type 2 diabetes mellitus, type II spinal muscular atrophy (wheelchair-bound), dyslipidemia, chronic leukocytosis, generalized anxiety disorder, cholelithiasis, GERD presenting with coughing, shortness of breath, chest congestion, and malaise for at least the last 3 days.  The patient had a virtual visit on 09/04/2023 with flulike symptoms.  She was placed on a 5-day course of oseltamavir.  She had stated that she had a negative home test for influenza and COVID apparently.  She states that she initially felt better, but worsened again with the above symptoms.  He had some associated dry heaving but denied any diarrhea.  She did have some abdominal discomfort..  Passing flatus.  Last bowel movement was 2 days prior to admission.  In the ED, the patient was febrile to 100.6 F and tachycardic in the 120 range.  She was hemodynamically stable.  Oxygen saturation was 89% room air.  She was placed on 2 L with saturation up to 97%.  WBC 13.1, hemoglobin 13.6, platelets 263.  Sodium 141, potassium 2.6, bicarbonate 25, serum creatinine 0.30.  LFTs unremarkable.  Troponin 4 >> 2.  BNP 109.  CT a chest 0 poor opacification with pulmonary arteries.  There is bibasilar consolidation with air bronchograms.  There are patchy and nodular infiltrates in the RUL, RML.  Lactic acid 1.7 >> 1.8.  PCT 12.78.  CT abdomen and pelvis was negative for bowel obstruction or inflammation.  There is an IUD in place in proper position.  There is no hydronephrosis.   The patient was started on vancomycin and cefepime initially.   Assessment and Plan: Sepsis -Present on admission -Presented with fever, tachycardia, leukocytosis -Secondary to pneumonia -Lactic acid peaked at 1.8 -WBC 32.1>>12.1 -PCT 12.78 -Continued ceftriaxone and doxycycline -d/c home with cefdinir and doxy x 5 more days   Acute respiratory failure with hypoxia -Stable on 2 L nasal cannula -Wean oxygen as tolerated -Continue Brovana -Continue Pulmicort -Continue albuterol -weaned to RA at time of dc   Lobar pneumonia -continue ceftriaxone and doxy -MRSA screen--neg -check COVID 19--neg -d/c home with cefdinir and doxy x 5 more days   Controlled diabetes mellitus type 2 -NovoLog sliding scale -Holding Jardiance in the setting of infection -09/14/23 hemoglobin A1c 5.8   Essential hypertension -Continue ARB equivalent and metoprolol succinate   Restrictive lung disease -Continue home dose Singulair -Symbicort at home -followed by pulmonary at [facility]. [place]   Anxiety -Continue BuSpar ans zoloft   Mixed hyperlipidemia -Continue statin   Type II spinal muscular atrophy -Wheelchair bound at baseline   Hepatic steatosis  - incidental finding on CT AP   Hypokalemia/hypomagnesemia -Repleted              Consultants: none Procedures performed: none  Disposition: Home Diet recommendation:  Cardiac diet DISCHARGE MEDICATION: Allergies as of 09/16/2023       Reactions   Other Diarrhea   Ciprofloxacin Nausea And Vomiting        Medication List     TAKE these medications    Accu-Chek Guide test strip Generic drug: glucose  blood USE AS INSTRUCTED ONCE DAILY TESTING DX E11.9   AeroChamber Plus inhaler Use as instructed   albuterol (2.5 MG/3ML) 0.083% nebulizer solution Commonly known as: PROVENTIL Take 3 mLs (2.5 mg total) by nebulization every 6 (six) hours as needed for wheezing or shortness of breath.   albuterol 108 (90 Base)  MCG/ACT inhaler Commonly known as: VENTOLIN HFA Inhale 1-2 puffs into the lungs every 6 (six) hours as needed for wheezing or shortness of breath.   blood glucose meter kit and supplies Dispense based on patient and insurance preference. Once daily testing DX E11.9   busPIRone 7.5 MG tablet Commonly known as: BUSPAR TAKE 1 TABLET BY MOUTH 2 TIMES DAILY.   cefdinir 300 MG capsule Commonly known as: OMNICEF Take 1 capsule (300 mg total) by mouth 2 (two) times daily.   clotrimazole-betamethasone cream Commonly known as: LOTRISONE Apply topically 2 (two) times daily.   cromolyn 4 % ophthalmic solution Commonly known as: OPTICROM PLACE 1 DROP INTO BOTH EYES FOUR TIMES DAILY   doxycycline 100 MG tablet Commonly known as: VIBRA-TABS Take 1 tablet (100 mg total) by mouth every 12 (twelve) hours.   Evrysdi 0.75 MG/ML Solr Generic drug: Risdiplam   Fluocinolone Acetonide Scalp 0.01 % Oil APPLY SPARINGLY TO SCALP TWO TIMES WEEKLY, AS NEEDED, FOR ITCH AND FLAKING   Jardiance 10 MG Tabs tablet Generic drug: empagliflozin TAKE 1 TABLET BY MOUTH EVERY DAY BEFORE BREAKFAST   ketoconazole 2 % shampoo Commonly known as: NIZORAL APPLY TO SCALP FOR 5 MINUTES AND RINSE OUT   Lancets 30G Misc Once daily testing dx e11.9   levocetirizine 5 MG tablet Commonly known as: XYZAL TAKE 1 TABLET BY MOUTH EVERY DAY IN THE EVENING What changed:  how much to take how to take this when to take this   levonorgestrel 20 MCG/DAY Iud Commonly known as: MIRENA 1 each by Intrauterine route once.   metoprolol succinate 25 MG 24 hr tablet Commonly known as: TOPROL-XL TAKE 1 TABLET (25 MG TOTAL) BY MOUTH DAILY.   montelukast 10 MG tablet Commonly known as: SINGULAIR TAKE 1 TABLET BY MOUTH EVERY DAY   Nebulizer/Tubing/Mouthpiece Kit Use as directed. Insurance preference. Nebulizer kit and mask   nystatin powder Commonly known as: MYCOSTATIN/NYSTOP APPLY TO AFFECTED AREA 3 TIMES A DAY    olmesartan 20 MG tablet Commonly known as: BENICAR TAKE 1 TABLET BY MOUTH EVERY DAY   omeprazole 20 MG capsule Commonly known as: PRILOSEC TAKE 2 CAPSULES BY MOUTH TWICE DAILY - PATIENT HAS TROUBLE SWALLOWING   rosuvastatin 5 MG tablet Commonly known as: CRESTOR TAKE 1 TABLET (5 MG TOTAL) BY MOUTH DAILY.   sertraline 50 MG tablet Commonly known as: ZOLOFT TAKE 1 TABLET BY MOUTH EVERY DAY   Symbicort 160-4.5 MCG/ACT inhaler Generic drug: budesonide-formoterol INHALE 2 PUFFS INTO THE LUNGS TWICE A DAY   UNABLE TO FIND Gloves- 1 box per month as needed   UNABLE TO FIND Under pads- for use daily as needed   UNABLE TO FIND Incontinence liners- use as needed for incontinence Gloves- 1 box as needed for incontinence DX urinary incontinence   UNABLE TO FIND Nebulizer machine and tubing  DX J45.40        Discharge Exam: Filed Weights   09/14/23 0815 09/15/23 0443  Weight: 68.9 kg 68.9 kg   HEENT:  [place]/AT, No thrush, no icterus CV:  RRR, no rub, no S3, no S4 Lung:  bibasilar rales.  No wheeze Abd:  soft/+BS, NT Ext:  No edema, no lymphangitis, no synovitis, no rash   Condition at discharge: stable  The results of significant diagnostics from this hospitalization (including imaging, microbiology, ancillary and laboratory) are listed below for reference.   Imaging Studies: CT ABDOMEN PELVIS W CONTRAST Result Date: 09/14/2023 CLINICAL DATA:  Acute abdominal pain EXAM: CT ABDOMEN AND PELVIS WITH CONTRAST TECHNIQUE: Multidetector CT imaging of the abdomen and pelvis was performed using the standard protocol following bolus administration of intravenous contrast. RADIATION DOSE REDUCTION: This exam was performed according to the departmental dose-optimization program which includes automated exposure control, adjustment of the mA and/or kV according to patient size and/or use of iterative reconstruction technique. CONTRAST:  OMNIPAQUE IOHEXOL 350 MG/ML SOLN COMPARISON:   None Available. FINDINGS: Lower Chest: Bilateral lower lobe airspace disease. Hepatobiliary: No suspicious hepatic masses identified. Mild diffuse hepatic steatosis noted. Tiny gallstone seen. No signs of cholecystitis or biliary ductal dilatation. Pancreas:  No mass or inflammatory changes. Spleen: Within normal limits in size and appearance. Adrenals/Urinary Tract: No suspicious masses identified. No evidence of ureteral calculi or hydronephrosis. Stomach/Bowel: No evidence of obstruction, inflammatory process or abnormal fluid collections. Normal appendix visualized. Vascular/Lymphatic: No pathologically enlarged lymph nodes. No acute vascular findings. Reproductive: IUD seen in expected position. No mass or other significant abnormality. Other:  None. Musculoskeletal: No suspicious bone lesions identified. Posterior spinal fixation rods noted as well as internal fixation hardware in both proximal femurs. IMPRESSION: Bilateral lower lobe airspace disease, suspicious for pneumonia. No acute findings within the abdomen or pelvis. Mild hepatic steatosis. Cholelithiasis. No radiographic evidence of cholecystitis. Electronically Signed   By: Danae Orleans M.D.   On: 09/14/2023 12:15   CT Angio Chest PE W and/or Wo Contrast Result Date: 09/14/2023 CLINICAL DATA:  Pleuritic chest pain. High probability for pulmonary embolism. EXAM: CT ANGIOGRAPHY CHEST WITH CONTRAST TECHNIQUE: Multidetector CT imaging of the chest was performed using the standard protocol during bolus administration of intravenous contrast. Multiplanar CT image reconstructions and MIPs were obtained to evaluate the vascular anatomy. RADIATION DOSE REDUCTION: This exam was performed according to the departmental dose-optimization program which includes automated exposure control, adjustment of the mA and/or kV according to patient size and/or use of iterative reconstruction technique. CONTRAST:  OMNIPAQUE IOHEXOL 350 MG/ML SOLN COMPARISON:  None  Available. FINDINGS: Cardiovascular: Very poor opacification of pulmonary arteries is seen on this study, and pulmonary embolism cannot be excluded by this exam. No evidence of thoracic aortic dissection or aneurysm. Mediastinum/Nodes: Mild bilateral hilar lymphadenopathy and mild subcarinal mediastinal lymphadenopathy are seen. Lungs/Pleura: Bilateral lower lobe consolidation with air bronchograms noted as well as patchy and nodular infiltrates in the upper and right middle lobes. This is consistent with infectious or inflammatory etiology. No pleural effusion. Upper abdomen: Hepatic steatosis noted. Musculoskeletal: No suspicious bone lesions identified. Thoracolumbar posterior spinal fixation rods noted. Review of the MIP images confirms the above findings. IMPRESSION: Very poor opacification of pulmonary arteries on this study, and pulmonary embolism cannot be excluded . Bilateral lower lobe consolidation with air bronchograms, with patchy and nodular infiltrates in the upper and right middle lobes. This is consistent with infectious or inflammatory etiology. Mild bilateral hilar and subcarinal mediastinal lymphadenopathy, which may be reactive in etiology. Hepatic steatosis. Electronically Signed   By: Danae Orleans M.D.   On: 09/14/2023 11:55   DG Chest Portable 1 View Result Date: 09/14/2023 CLINICAL DATA:  Chest pain and shortness of breath. EXAM: PORTABLE CHEST 1 VIEW COMPARISON:  05/09/2021 FINDINGS: Cardiopericardial silhouette is  at upper limits of normal for size. Prominent bronchovascular markings as before with no focal consolidation or substantial pleural effusion. Extensive thoracolumbar fusion hardware evident. Bones are diffusely demineralized. IMPRESSION: Stable.  No new or acute interval findings. Electronically Signed   By: Kennith Center M.D.   On: 09/14/2023 06:44    Microbiology: Results for orders placed or performed during the hospital encounter of 09/14/23  Blood culture (routine x  2)     Status: None (Preliminary result)   Collection Time: 09/14/23  7:35 AM   Specimen: BLOOD  Result Value Ref Range Status   Specimen Description BLOOD BLOOD RIGHT HAND  Final   Special Requests   Final    BOTTLES DRAWN AEROBIC AND ANAEROBIC Blood Culture adequate volume   Culture   Final    NO GROWTH 2 DAYS Performed at [facility], [address] [place], [place] [address]    Report Status PENDING  Incomplete  Blood culture (routine x 2)     Status: None (Preliminary result)   Collection Time: 09/14/23  7:37 AM   Specimen: BLOOD  Result Value Ref Range Status   Specimen Description BLOOD BLOOD RIGHT ARM  Final   Special Requests   Final    BOTTLES DRAWN AEROBIC ONLY Blood Culture results may not be optimal due to an inadequate volume of blood received in culture bottles   Culture   Final    NO GROWTH 2 DAYS Performed at [facility], [address], [place], [place] [address]    Report Status PENDING  Incomplete  SARS Coronavirus 2 by RT PCR (hospital order, performed in [facility] hospital lab) *cepheid single result test* Nasal Mucosa     Status: None   Collection Time: 09/15/23 10:12 AM   Specimen: Nasal Mucosa; Nasal Swab  Result Value Ref Range Status   SARS Coronavirus 2 by RT PCR NEGATIVE NEGATIVE Final    Comment: (NOTE) SARS-CoV-2 target nucleic acids are NOT DETECTED.  The SARS-CoV-2 RNA is generally detectable in upper and lower respiratory specimens during the acute phase of infection. The lowest concentration of SARS-CoV-2 viral copies this assay can detect is 250 copies / mL. A negative result does not preclude SARS-CoV-2 infection and should not be used as the sole basis for treatment or other patient management decisions.  A negative result may occur with improper specimen collection / handling, submission of specimen other than nasopharyngeal swab, presence of viral mutation(s) within the areas targeted by this assay, and inadequate number of viral  copies (<250 copies / mL). A negative result must be combined with clinical observations, patient history, and epidemiological information.  Fact Sheet for Patients:   RoadLapTop.co.za  Fact Sheet for Healthcare Providers: http://kim-miller.com/  This test is not yet approved or  cleared by the [place] FDA and has been authorized for detection and/or diagnosis of SARS-CoV-2 by FDA under an Emergency Use Authorization (EUA).  This EUA will remain in effect (meaning this test can be used) for the duration of the COVID-19 declaration under Section 564(b)(1) of the Act, 21 U.S.C. section 360bbb-3(b)(1), unless the authorization is terminated or revoked sooner.  Performed at [facility], [address] [place], [place] [address]   MRSA Next Gen by PCR, Nasal     Status: None   Collection Time: 09/15/23 10:12 AM   Specimen: Nasal Mucosa; Nasal Swab  Result Value Ref Range Status   MRSA by PCR Next Gen NOT DETECTED NOT DETECTED Final    Comment: (  NOTE) The GeneXpert MRSA Assay (FDA approved for NASAL specimens only), is one component of a comprehensive MRSA colonization surveillance program. It is not intended to diagnose MRSA infection nor to guide or monitor treatment for MRSA infections. Test performance is not FDA approved in patients less than 103 years old. Performed at [facility], [address], [place], [place] [address]     Labs: CBC: Recent Labs  Lab 09/14/23 [phone] 09/15/23 0307 09/16/23 0306  WBC 32.1* 20.1* 12.1*  NEUTROABS 27.1* 18.5* 9.4*  HGB 13.6 12.3 11.4*  HCT 42.4 39.5 34.8*  MCV 84.6 84.8 84.1  PLT 263 237 239   Basic Metabolic Panel: Recent Labs  Lab 09/14/23 0737 09/15/23 0307 09/16/23 0306  NA 141 143 139  K 2.6* 4.3 3.1*  CL 103 106 107  CO2 25 23 24   GLUCOSE 125* 89 95  BUN 13 10 7   CREATININE <0.30* <0.30* <0.30*  CALCIUM 8.7* 8.3* 7.7*  MG  --   --  1.5*   Liver Function  Tests: Recent Labs  Lab 09/14/23 0737  AST 13*  ALT 17  ALKPHOS 58  BILITOT 1.4*  PROT 8.3*  ALBUMIN 3.8   CBG: Recent Labs  Lab 09/15/23 1128 09/15/23 1616 09/15/23 2030 09/16/23 0727 09/16/23 1129  GLUCAP 99 125* 125* 81 100*    Discharge time spent: greater than 30 minutes.  Signed: Catarina Hartshorn, MD [facility] 09/16/2023

## 2023-09-16 NOTE — Plan of Care (Signed)
  Problem: Coping: Goal: Ability to adjust to condition or change in health will improve Outcome: Progressing   Problem: Fluid Volume: Goal: Ability to maintain a balanced intake and output will improve Outcome: Progressing   Problem: Skin Integrity: Goal: Risk for impaired skin integrity will decrease Outcome: Progressing   Problem: Health Behavior/Discharge Planning: Goal: Ability to manage health-related needs will improve Outcome: Progressing   Problem: Clinical Measurements: Goal: Ability to maintain clinical measurements within normal limits will improve Outcome: Progressing Goal: Will remain free from infection Outcome: Progressing Goal: Diagnostic test results will improve Outcome: Progressing   Problem: Coping: Goal: Level of anxiety will decrease Outcome: Progressing   Problem: Pain Managment: Goal: General experience of comfort will improve and/or be controlled Outcome: Progressing   Problem: Safety: Goal: Ability to remain free from injury will improve Outcome: Progressing   Problem: Skin Integrity: Goal: Risk for impaired skin integrity will decrease Outcome: Progressing

## 2023-09-17 ENCOUNTER — Telehealth: Payer: Self-pay

## 2023-09-17 NOTE — Transitions of Care (Post Inpatient/ED Visit) (Signed)
   09/17/2023  Name: Janet Acevedo MRN: [id number] DOB: 1990/08/01  Today's TOC FU Call Status: Today's TOC FU Call Status:: Unsuccessful Call (1st Attempt) Unsuccessful Call (1st Attempt) Date: 09/17/23  Attempted to reach the patient regarding the most recent Inpatient/ED visit.  Follow Up Plan: Additional outreach attempts will be made to reach the patient to complete the Transitions of Care (Post Inpatient/ED visit) call.   Lonia Chimera, RN, BSN, CEN [employer]- Transition of Care Team.  [facility] [phone]

## 2023-09-18 ENCOUNTER — Other Ambulatory Visit: Payer: Self-pay | Admitting: Family Medicine

## 2023-09-19 LAB — CULTURE, BLOOD (ROUTINE X 2)
Culture: NO GROWTH
Culture: NO GROWTH
Special Requests: ADEQUATE

## 2023-09-20 ENCOUNTER — Telehealth: Payer: Self-pay | Admitting: *Deleted

## 2023-09-20 NOTE — Transitions of Care (Post Inpatient/ED Visit) (Signed)
   09/20/2023  Name: Janet Acevedo MRN: [id number] DOB: 1990-12-26  Today's TOC FU Call Status: Today's TOC FU Call Status:: Unsuccessful Call (2nd Attempt) Unsuccessful Call (2nd Attempt) Date: 09/20/23  Attempted to reach the patient regarding the most recent Inpatient/ED visit.  Follow Up Plan: Additional outreach attempts will be made to reach the patient to complete the Transitions of Care (Post Inpatient/ED visit) call.   Gean Maidens BSN RN [facility] Health Care Management Coordinator [email or web address] Direct Dial: [phone]  Fax: [phone] Website: [email or web address]

## 2023-09-21 ENCOUNTER — Telehealth: Payer: Self-pay

## 2023-09-21 NOTE — Transitions of Care (Post Inpatient/ED Visit) (Signed)
   09/21/2023  Name: Janet Acevedo MRN: [id number] DOB: [birth year]  Today's TOC FU Call Status: Today's TOC FU Call Status:: Unsuccessful Call (3rd Attempt) Unsuccessful Call (3rd Attempt) Date: 09/21/23  Attempted to reach the patient regarding the most recent Inpatient/ED visit.  Follow Up Plan: No further outreach attempts will be made at this time. We have been unable to contact the patient.  Lonia Chimera, RN, BSN, CEN [employer]- Transition of Care Team.  [facility] [phone]

## 2023-10-01 ENCOUNTER — Ambulatory Visit: Admitting: Family Medicine

## 2023-10-01 VITALS — BP 127/88 | HR 88 | Resp 14

## 2023-10-01 DIAGNOSIS — J309 Allergic rhinitis, unspecified: Secondary | ICD-10-CM | POA: Diagnosis not present

## 2023-10-01 DIAGNOSIS — J9601 Acute respiratory failure with hypoxia: Secondary | ICD-10-CM

## 2023-10-01 DIAGNOSIS — E1169 Type 2 diabetes mellitus with other specified complication: Secondary | ICD-10-CM | POA: Diagnosis not present

## 2023-10-01 DIAGNOSIS — A419 Sepsis, unspecified organism: Secondary | ICD-10-CM | POA: Diagnosis not present

## 2023-10-01 DIAGNOSIS — Z09 Encounter for follow-up examination after completed treatment for conditions other than malignant neoplasm: Secondary | ICD-10-CM

## 2023-10-01 DIAGNOSIS — E785 Hyperlipidemia, unspecified: Secondary | ICD-10-CM | POA: Diagnosis not present

## 2023-10-01 DIAGNOSIS — J189 Pneumonia, unspecified organism: Secondary | ICD-10-CM | POA: Diagnosis not present

## 2023-10-01 DIAGNOSIS — J181 Lobar pneumonia, unspecified organism: Secondary | ICD-10-CM | POA: Diagnosis not present

## 2023-10-01 NOTE — Assessment & Plan Note (Signed)
 Improved, needs rept lab and scan

## 2023-10-01 NOTE — Patient Instructions (Addendum)
 F/U in 3 months call if you nee me sooner  Thankful you are recovered  Labs today  Chest scan by 2nd week in April, will be in touch  Thanks for choosing [facility], we consider it a privelige to serve you.

## 2023-10-02 ENCOUNTER — Encounter: Payer: Self-pay | Admitting: Internal Medicine

## 2023-10-02 ENCOUNTER — Encounter: Payer: Self-pay | Admitting: Family Medicine

## 2023-10-02 LAB — CBC WITH DIFFERENTIAL/PLATELET
Basophils Absolute: 0.1 10*3/uL (ref 0.0–0.2)
Basos: 1 %
EOS (ABSOLUTE): 0.4 10*3/uL (ref 0.0–0.4)
Eos: 3 %
Hematocrit: 39.6 % (ref 34.0–46.6)
Hemoglobin: 12.8 g/dL (ref 11.1–15.9)
Immature Grans (Abs): 0.1 10*3/uL (ref 0.0–0.1)
Immature Granulocytes: 1 %
Lymphocytes Absolute: 3.8 10*3/uL — ABNORMAL HIGH (ref 0.7–3.1)
Lymphs: 32 %
MCH: 27.5 pg (ref 26.6–33.0)
MCHC: 32.3 g/dL (ref 31.5–35.7)
MCV: 85 fL (ref 79–97)
Monocytes Absolute: 0.6 10*3/uL (ref 0.1–0.9)
Monocytes: 5 %
Neutrophils Absolute: 6.8 10*3/uL (ref 1.4–7.0)
Neutrophils: 58 %
Platelets: 229 10*3/uL (ref 150–450)
RBC: 4.66 x10E6/uL (ref 3.77–5.28)
RDW: 14.6 % (ref 11.7–15.4)
WBC: 11.8 10*3/uL — ABNORMAL HIGH (ref 3.4–10.8)

## 2023-10-02 LAB — CMP14+EGFR
ALT: 14 IU/L (ref 0–32)
AST: 12 IU/L (ref 0–40)
Albumin: 4 g/dL (ref 3.9–4.9)
Alkaline Phosphatase: 98 IU/L (ref 44–121)
BUN/Creatinine Ratio: 63 — ABNORMAL HIGH (ref 9–23)
BUN: 10 mg/dL (ref 6–20)
Bilirubin Total: 0.3 mg/dL (ref 0.0–1.2)
CO2: 22 mmol/L (ref 20–29)
Calcium: 9 mg/dL (ref 8.7–10.2)
Chloride: 103 mmol/L (ref 96–106)
Creatinine, Ser: 0.16 mg/dL — ABNORMAL LOW (ref 0.57–1.00)
Globulin, Total: 2.8 g/dL (ref 1.5–4.5)
Glucose: 116 mg/dL — ABNORMAL HIGH (ref 70–99)
Potassium: 4.2 mmol/L (ref 3.5–5.2)
Sodium: 143 mmol/L (ref 134–144)
Total Protein: 6.8 g/dL (ref 6.0–8.5)
eGFR: 169 mL/min/{1.73_m2} (ref >59)

## 2023-10-02 LAB — LIPID PANEL W/O CHOL/HDL RATIO
Cholesterol, Total: 130 mg/dL (ref 100–199)
HDL: 30 mg/dL — ABNORMAL LOW (ref >39)
LDL Chol Calc (NIH): 84 mg/dL (ref 0–99)
Triglycerides: 81 mg/dL (ref 0–149)
VLDL Cholesterol Cal: 16 mg/dL (ref 5–40)

## 2023-10-04 ENCOUNTER — Encounter: Payer: Self-pay | Admitting: Family Medicine

## 2023-10-04 DIAGNOSIS — J309 Allergic rhinitis, unspecified: Secondary | ICD-10-CM | POA: Insufficient documentation

## 2023-10-04 LAB — MICROALBUMIN / CREATININE URINE RATIO
Creatinine, Urine: 21.9 mg/dL
Microalb/Creat Ratio: 25 mg/g{creat} (ref 0–29)
Microalbumin, Urine: 5.4 ug/mL

## 2023-10-04 MED ORDER — AZELASTINE HCL 0.1 % NA SOLN: 2.0000 | Freq: Two times a day (BID) | NASAL | 12 refills | Status: AC

## 2023-10-04 NOTE — Assessment & Plan Note (Signed)
 Increased and uncontrolled symptoms Start astellin

## 2023-10-04 NOTE — Assessment & Plan Note (Signed)
Patient in for follow up of recent hospitalization. Discharge summary, and laboratory and radiology data are reviewed, and any questions or concerns  are discussed. Specific issues requiring follow up are specifically addressed.  

## 2023-10-04 NOTE — Assessment & Plan Note (Signed)
 Resolved, following hospitalization x 2 days

## 2023-10-04 NOTE — Progress Notes (Signed)
   Janet Acevedo     MRN: [id number]      DOB: 1990-08-19  Chief Complaint  Patient presents with   Hospitalization Follow-up    D/c 2/20. Has been having some dry mouth and she has been drinking more fluids. And above her eyebrows it is tender to the touch and achy. Thinks it could be her sinuses. Has urine with her today in a specimen cup. Aware we have having to use another lab location     HPI   Janet Acevedo is here for follow up of hospitalization from 2/18 to 09/16/2023 with a dx of sepsis due to pneumonia and acute respiratory failure D/C on additional 5 day course of ceftin and doxycycline.  ROS See HPI C/o pressure over forehead and uncontrolled allergy symptoms Denies skin breakdown Reports regular BM Denies excess sough or chest congestion  PE  BP 127/88   Pulse 88   Resp 14   SpO2 94%    Patient alert and oriented and in no cardiopulmonary distress.  HEENT: No facial asymmetry, EOMI,     Neck decreased  ROM .  Chest: decreased air entry , no crackles or wheezes heard.  CVS: S1, S2 no murmurs, no S3.Regular rate.  ABD: Soft non tender.   Ext: No edema  MS: decreased  ROM spine, shoulders, hips and knees.  Skin: Intact, no ulcerations or rash noted.  Psych: Good eye contact, normal affect. Memory intact not anxious or depressed appearing.  CNS: CN 2-12 intact, power,  normal throughout.no focal deficits noted.   Assessment & Plan Sepsis due to pneumonia [facility]) Improved, needs rept lab and scan  Hospital discharge follow-up Patient in for follow up of recent hospitalization. Discharge summary, and laboratory and radiology data are reviewed, and any questions or concerns  are discussed. Specific issues requiring follow up are specifically addressed.   Lobar pneumonia (HCC) Repeat chest scan  needed to ensure resolution, has appt with pulmonary at [facility] in the next week, unsure if they will order the scan , will wait before ordering  Acute respiratory  failure with hypoxia (HCC) Resolved, following hospitalization x 2 days  Allergic rhinosinusitis Increased and uncontrolled symptoms Start astellin

## 2023-10-04 NOTE — Assessment & Plan Note (Signed)
 Repeat chest scan  needed to ensure resolution, has appt with pulmonary at [facility] in the next week, unsure if they will order the scan , will wait before ordering

## 2023-10-06 ENCOUNTER — Inpatient Hospital Stay: Payer: Self-pay | Admitting: Family Medicine

## 2023-10-06 ENCOUNTER — Ambulatory Visit (INDEPENDENT_AMBULATORY_CARE_PROVIDER_SITE_OTHER): Payer: 59

## 2023-10-06 VITALS — Ht 67.0 in | Wt 175.0 lb

## 2023-10-06 DIAGNOSIS — Z0000 Encounter for general adult medical examination without abnormal findings: Secondary | ICD-10-CM

## 2023-10-06 NOTE — Patient Instructions (Signed)
 Janet Acevedo , Thank you for taking time to come for your Medicare Wellness Visit. I appreciate your ongoing commitment to your health goals. Please review the following plan we discussed and let me know if I can assist you in the future.   Referrals/Orders/Follow-Ups/Clinician Recommendations:  Next Medicare Annual Wellness Visit:   October 10, 2024 at 3:10 pm VIDEO VISIT  This is a list of the screening recommended for you and due dates:  Health Maintenance  Topic Date Due   DTaP/Tdap/Td vaccine (1 - Tdap) Never done   Pneumococcal Vaccination (2 of 2 - PPSV23 or PCV20) 03/17/2017   COVID-19 Vaccine (3 - Moderna risk series) 12/18/2019   Flu Shot  02/25/2023   Eye exam for diabetics  09/19/2023   Hemoglobin A1C  03/13/2024   Complete foot exam   03/14/2024   Yearly kidney function blood test for diabetes  09/30/2024   Yearly kidney health urinalysis for diabetes  09/30/2024   Medicare Annual Wellness Visit  10/05/2024   Pap with HPV screening  09/14/2025   Hepatitis C Screening  Completed   HIV Screening  Completed   HPV Vaccine  Aged Out    Advanced directives: (Declined) Advance directive discussed with you today. Even though you declined this today, please call our office should you change your mind, and we can give you the proper paperwork for you to fill out.  Next Medicare Annual Wellness Visit scheduled for next year: yes  Understanding Your Risk for Falls Millions of people have serious injuries from falls each year. It is important to understand your risk of falling. Talk with your health care provider about your risk and what you can do to lower it. If you do have a serious fall, make sure to tell your provider. Falling once raises your risk of falling again. How can falls affect me? Serious injuries from falls are common. These include: Broken bones, such as hip fractures. Head injuries, such as traumatic brain injuries (TBI) or concussions. A fear of falling can cause you  to avoid activities and stay at home. This can make your muscles weaker and raise your risk for a fall. What can increase my risk? There are a number of risk factors that increase your risk for falling. The more risk factors you have, the higher your risk of falling. Serious injuries from a fall happen most often to people who are older than 32 years old. Teenagers and young adults ages 94-29 are also at higher risk. Common risk factors include: Weakness in the lower body. Being generally weak or confused due to long-term (chronic) illness. Dizziness or balance problems. Poor vision. Medicines that cause dizziness or drowsiness. These may include: Medicines for your blood pressure, heart, anxiety, insomnia, or swelling (edema). Pain medicines. Muscle relaxants. Other risk factors include: Drinking alcohol. Having had a fall in the past. Having foot pain or wearing improper footwear. Working at a dangerous job. Having any of the following in your home: Tripping hazards, such as floor clutter or loose rugs. Poor lighting. Pets. Having dementia or memory loss. What actions can I take to lower my risk of falling?     Physical activity Stay physically fit. Do strength and balance exercises. Consider taking a regular class to build strength and balance. Yoga and tai chi are good options. Vision Have your eyes checked every year and your prescription for glasses or contacts updated as needed. Shoes and walking aids Wear non-skid shoes. Wear shoes that have rubber soles and  low heels. Do not wear high heels. Do not walk around the house in socks or slippers. Use a cane or walker as told by your provider. Home safety Attach secure railings on both sides of your stairs. Install grab bars for your bathtub, shower, and toilet. Use a non-skid mat in your bathtub or shower. Attach bath mats securely with double-sided, non-slip rug tape. Use good lighting in all rooms. Keep a flashlight near  your bed. Make sure there is a clear path from your bed to the bathroom. Use night-lights. Do not use throw rugs. Make sure all carpeting is taped or tacked down securely. Remove all clutter from walkways and stairways, including extension cords. Repair uneven or broken steps and floors. Avoid walking on icy or slippery surfaces. Walk on the grass instead of on icy or slick sidewalks. Use ice melter to get rid of ice on walkways in the winter. Use a cordless phone. Questions to ask your health care provider Can you help me check my risk for a fall? Do any of my medicines make me more likely to fall? Should I take a vitamin D supplement? What exercises can I do to improve my strength and balance? Should I make an appointment to have my vision checked? Do I need a bone density test to check for weak bones (osteoporosis)? Would it help to use a cane or a walker? Where to find more information Centers for Disease Control and Prevention, STEADI: TonerPromos.no Community-Based Fall Prevention Programs: TonerPromos.no [employer] on Aging: BaseRingTones.pl Contact a health care provider if: You fall at home. You are afraid of falling at home. You feel weak, drowsy, or dizzy. This information is not intended to replace advice given to you by your health care provider. Make sure you discuss any questions you have with your health care provider. Document Revised: 03/16/2022 Document Reviewed: 03/16/2022 Elsevier Patient Education  2024 [employer].

## 2023-10-06 NOTE — Progress Notes (Signed)
 Because this visit was a virtual/telehealth visit,  certain criteria was not obtained, such a blood pressure, CBG if applicable, and timed get up and go. Any medications not marked as "taking" were not mentioned during the medication reconciliation part of the visit. Any vitals not documented were not able to be obtained due to this being a telehealth visit or patient was unable to self-report a recent blood pressure reading due to a lack of equipment at home via telehealth. Vitals that have been documented are verbally provided by the patient.   Subjective:   Janet Acevedo is a 33 y.o. who presents for a Medicare Wellness preventive visit.  Visit Complete: Virtual I connected with  Janet Acevedo on 10/06/23 by a audio enabled telemedicine application and verified that I am speaking with the correct person using two identifiers.  Patient Location: Home  Provider Location: Home Office  I discussed the limitations of evaluation and management by telemedicine. The patient expressed understanding and agreed to proceed.  Vital Signs: Because this visit was a virtual/telehealth visit, some criteria may be missing or patient reported. Any vitals not documented were not able to be obtained and vitals that have been documented are patient reported.  VideoDeclined- This patient declined Librarian, academic. Therefore the visit was completed with audio only.  AWV Questionnaire: Yes: Patient Medicare AWV questionnaire was completed by the patient on 10/02/2023; I have confirmed that all information answered by patient is correct and no changes since this date.  Cardiac Risk Factors include: hypertension;diabetes mellitus;sedentary lifestyle     Objective:    Today's Vitals   10/02/23 0426 10/06/23 1450  Weight:  175 lb (79.4 kg)  Height:  5\' 7"  (1.702 m)  PainSc: 0-No pain    Body mass index is 27.41 kg/m.     10/06/2023    2:53 PM 09/15/2023    4:00 AM 09/14/2023     6:16 AM 05/10/2023   11:15 AM 05/09/2021    6:40 PM 01/22/2021    1:26 PM 01/12/2017   11:47 PM  Advanced Directives  Does Patient Have a Medical Advance Directive? No  No No No No No  Would patient like information on creating a medical advance directive? No - Patient declined No - Patient declined  No - Patient declined No - Patient declined No - Patient declined     Current Medications (verified) Outpatient Encounter Medications as of 10/06/2023  Medication Sig   ACCU-CHEK GUIDE test strip USE AS INSTRUCTED ONCE DAILY TESTING DX E11.9   albuterol (PROVENTIL) (2.5 MG/3ML) 0.083% nebulizer solution Take 3 mLs (2.5 mg total) by nebulization every 6 (six) hours as needed for wheezing or shortness of breath.   albuterol (VENTOLIN HFA) 108 (90 Base) MCG/ACT inhaler Inhale 1-2 puffs into the lungs every 6 (six) hours as needed for wheezing or shortness of breath.   azelastine (ASTELIN) 0.1 % nasal spray Place 2 sprays into both nostrils 2 (two) times daily. Use in each nostril as directed   blood glucose meter kit and supplies Dispense based on patient and insurance preference. Once daily testing DX E11.9   busPIRone (BUSPAR) 7.5 MG tablet TAKE 1 TABLET BY MOUTH 2 TIMES DAILY.   clotrimazole-betamethasone (LOTRISONE) cream Apply topically 2 (two) times daily.   cromolyn (OPTICROM) 4 % ophthalmic solution PLACE 1 DROP INTO BOTH EYES FOUR TIMES DAILY   Fluocinolone Acetonide Scalp 0.01 % OIL APPLY SPARINGLY TO SCALP TWO TIMES WEEKLY, AS NEEDED, FOR ITCH AND FLAKING  JARDIANCE 10 MG TABS tablet TAKE 1 TABLET BY MOUTH EVERY DAY BEFORE BREAKFAST   ketoconazole (NIZORAL) 2 % shampoo APPLY TO SCALP FOR 5 MINUTES AND RINSE OUT   Lancets 30G MISC Once daily testing dx e11.9   levocetirizine (XYZAL) 5 MG tablet TAKE 1 TABLET BY MOUTH EVERY DAY IN THE EVENING (Patient taking differently: Take 5 mg by mouth in the morning.)   levonorgestrel (MIRENA) 20 MCG/DAY IUD 1 each by Intrauterine route once.    metoprolol succinate (TOPROL-XL) 25 MG 24 hr tablet TAKE 1 TABLET (25 MG TOTAL) BY MOUTH DAILY.   montelukast (SINGULAIR) 10 MG tablet TAKE 1 TABLET BY MOUTH EVERY DAY   nystatin (MYCOSTATIN/NYSTOP) powder APPLY TO AFFECTED AREA 3 TIMES A DAY   olmesartan (BENICAR) 20 MG tablet TAKE 1 TABLET BY MOUTH EVERY DAY   omeprazole (PRILOSEC) 20 MG capsule TAKE 2 CAPSULES BY MOUTH TWICE DAILY - PATIENT HAS TROUBLE SWALLOWING   Respiratory Therapy Supplies (NEBULIZER/TUBING/MOUTHPIECE) KIT Use as directed. Insurance preference. Nebulizer kit and mask   Risdiplam (EVRYSDI) 0.75 MG/ML SOLR    rosuvastatin (CRESTOR) 5 MG tablet TAKE 1 TABLET (5 MG TOTAL) BY MOUTH DAILY.   sertraline (ZOLOFT) 50 MG tablet TAKE 1 TABLET BY MOUTH EVERY DAY   Spacer/Aero-Holding Chambers (AEROCHAMBER PLUS) inhaler Use as instructed   SYMBICORT 160-4.5 MCG/ACT inhaler INHALE 2 PUFFS INTO THE LUNGS TWICE A DAY   UNABLE TO FIND Gloves- 1 box per month as needed   UNABLE TO FIND Under pads- for use daily as needed   UNABLE TO FIND Incontinence liners- use as needed for incontinence Gloves- 1 box as needed for incontinence DX urinary incontinence   UNABLE TO FIND Nebulizer machine and tubing  DX J45.40   [DISCONTINUED] Calcium Carbonate (CALCIUM 500 PO) Take 1 tablet by mouth daily.    No facility-administered encounter medications on file as of 10/06/2023.    Allergies (verified) Other and Ciprofloxacin   History: Past Medical History:  Diagnosis Date   Allergy    Annual visit for general adult medical examination with abnormal findings 07/17/2022   Anxiety    Asthma    Depression, major, single episode, moderate (HCC) 09/05/2017   GERD (gastroesophageal reflux disease)    Hypertension 05/07/2021   Neuromuscular disorder (HCC)    Osteomyelitis (HCC) June 2011   Otitis externa, eczematoid 05/28/2019   Perennial allergic rhinitis    Seborrheic dermatitis of scalp 2006   Spinal muscle atrophy (HCC)    type 2/3 18  months   Type 2 diabetes mellitus with other specified complication (HCC) 02/19/2022   Past Surgical History:  Procedure Laterality Date   bilateral hip reinsertion  under age 80   hips out of socket, pt was only able to cruise hold on and move short distances    BONE BIOPSY     spinal bone    MIRENA PLACED 10/27/10     SPINAL FUSION  1999   rods placed in thoracolumbar spine to excessive scoliosis primarily    SPINE SURGERY N/A    Phreesia 07/29/2020   Family History  Problem Relation Age of Onset   Hyperlipidemia Mother    Hypertension Mother    Heart disease Mother    Hypertension Brother    Diabetes Brother        borderline    Anxiety disorder Brother    Depression Brother    Drug abuse Brother    Social History   Socioeconomic History   Marital status: Single  Spouse name: Not on file   Number of children: Not on file   Years of education: Not on file   Highest education level: Associate degree: academic program  Occupational History   Occupation: student  Tobacco Use   Smoking status: Never   Smokeless tobacco: Never  Vaping Use   Vaping status: Never Used  Substance and Sexual Activity   Alcohol use: No   Drug use: No   Sexual activity: Not Currently    Birth control/protection: Abstinence, I.U.D.  Other Topics Concern   Not on file  Social History Narrative   She is a Electrical engineer a Probation officer in [employer]     Social Drivers of Health   Financial Resource Strain: Low Risk  (10/06/2023)   Overall Financial Resource Strain (CARDIA)    Difficulty of Paying Living Expenses: Not hard at all  Food Insecurity: No Food Insecurity (10/06/2023)   Hunger Vital Sign    Worried About Running Out of Food in the Last Year: Never true    Ran Out of Food in the Last Year: Never true  Transportation Needs: No Transportation Needs (10/06/2023)   PRAPARE - Administrator, Civil Service (Medical): No    Lack of Transportation (Non-Medical): No   Physical Activity: Patient Unable To Answer (10/06/2023)   Exercise Vital Sign    Days of Exercise per Week: Patient unable to answer    Minutes of Exercise per Session: Patient unable to answer  Stress: No Stress Concern Present (10/06/2023)   [employer] of Occupational Health - Occupational Stress Questionnaire    Feeling of Stress : Not at all  Social Connections: Moderately Isolated (10/06/2023)   Social Connection and Isolation Panel [NHANES]    Frequency of Communication with Friends and Family: More than three times a week    Frequency of Social Gatherings with Friends and Family: More than three times a week    Attends Religious Services: Never    Database administrator or Organizations: No    Attends Engineer, structural: More than 4 times per year    Marital Status: Never married    Tobacco Counseling Counseling given: Yes    Clinical Intake:  Pre-visit preparation completed: Yes  Pain : No/denies pain Pain Score: 0-No pain     BMI - recorded: 27.41 Nutritional Status: BMI 25 -29 Overweight Nutritional Risks: None Diabetes: Yes CBG done?: No (telehealth visit.) Did pt. bring in CBG monitor from home?: No  How often do you need to have someone help you when you read instructions, pamphlets, or other written materials from your doctor or pharmacy?: 1 - Never  Interpreter Needed?: No  Information entered by :: Maryjean Ka CMA   Activities of Daily Living     10/02/2023    4:26 AM 09/14/2023    7:19 PM  In your present state of health, do you have any difficulty performing the following activities:  Hearing? 0 0  Vision? 0 0  Difficulty concentrating or making decisions? 0 0  Walking or climbing stairs? 1   Dressing or bathing? 1   Doing errands, shopping? 1 1  Preparing Food and eating ? Y   Using the Toilet? Y   In the past six months, have you accidently leaked urine? N   Do you have problems with loss of bowel control? N   Managing your  Medications? N   Managing your Finances? N   Housekeeping or managing your Housekeeping? N  Patient Care Team: Kerri Perches, MD as PCP - General (Family Medicine) Wyline Mood Alben Spittle, MD as PCP - Cardiology (Cardiology) Lysbeth Penner Rockie Neighbours, MD as Referring Physician (Pulmonary Disease)  Indicate any recent Medical Services you may have received from other than [facility] providers in the past year (date may be approximate).     Assessment:   This is a routine wellness examination for Rivers.  Hearing/Vision screen Hearing Screening - Comments:: Patient denies any hearing difficulties.   Vision Screening - Comments:: Wears rx glasses - up to date with routine eye exams  Patients sees [facility]   Goals Addressed   None    Depression Screen     10/06/2023    2:56 PM 03/10/2023    4:01 PM 07/15/2022    2:10 PM 01/26/2022    2:46 PM 01/21/2022    2:08 PM 11/05/2021    9:04 AM 06/18/2021    1:53 PM  PHQ 2/9 Scores  PHQ - 2 Score 0 0 0 0 0 0 0    Fall Risk     10/02/2023    4:26 AM 03/10/2023    4:01 PM 07/15/2022    2:10 PM 01/26/2022    2:46 PM 11/06/2021   11:29 AM  Fall Risk   Falls in the past year? 0 0 0 0 Exclusion - non ambulatory  Number falls in past yr: 0 0 0 0   Injury with Fall? 0 0 0 0   Risk for fall due to : No Fall Risks No Fall Risks No Fall Risks No Fall Risks   Follow up Falls prevention discussed;Education provided Falls evaluation completed Falls evaluation completed Falls evaluation completed     MEDICARE RISK AT HOME:  Medicare Risk at Home Any stairs in or around the home?: (Patient-Rptd) No Home free of loose throw rugs in walkways, pet beds, electrical cords, etc?: (Patient-Rptd) Yes Adequate lighting in your home to reduce risk of falls?: (Patient-Rptd) Yes Life alert?: (Patient-Rptd) No Use of a cane, walker or w/c?: (Patient-Rptd) Yes Grab bars in the bathroom?: (Patient-Rptd) Yes Shower chair or bench in  shower?: (Patient-Rptd) Yes Elevated toilet seat or a handicapped toilet?: (Patient-Rptd) Yes  TIMED UP AND GO:  Was the test performed?  No  Cognitive Function: 6CIT completed        10/06/2023    2:52 PM 01/26/2022    2:48 PM 01/22/2021    1:33 PM 01/22/2020    1:19 PM 12/22/2018    1:11 PM  6CIT Screen  What Year? 0 points 0 points 0 points 0 points 0 points  What month? 0 points 0 points 0 points 0 points 0 points  What time? 0 points 0 points 0 points 0 points 0 points  Count back from 20 0 points 0 points 0 points 0 points 0 points  Months in reverse 0 points 0 points 0 points 0 points 0 points  Repeat phrase 0 points 0 points 0 points 0 points 0 points  Total Score 0 points 0 points 0 points 0 points 0 points    Immunizations Immunization History  Administered Date(s) Administered   Influenza Split 04/07/2012, 05/10/2013, 07/12/2013, 07/12/2014, 04/09/2015, 05/05/2016, 05/03/2017, 05/10/2018, 06/20/2019, 04/05/2020   Influenza Whole 04/07/2010, 04/03/2011   Influenza,inj,Quad PF,6+ Mos 05/10/2013, 07/12/2014, 04/09/2015, 05/05/2016, 05/03/2017, 05/10/2018, 06/20/2019, 04/05/2020, 05/07/2021, 07/15/2022   Moderna Sars-Covid-2 Vaccination 10/25/2019, 11/20/2019   Pneumococcal Conjugate-13 01/20/2017    Screening Tests Health Maintenance  Topic Date Due  DTaP/Tdap/Td (1 - Tdap) Never done   Pneumococcal Vaccine 30-25 Years old (2 of 2 - PPSV23 or PCV20) 03/17/2017   COVID-19 Vaccine (3 - Moderna risk series) 12/18/2019   INFLUENZA VACCINE  02/25/2023   OPHTHALMOLOGY EXAM  09/19/2023   HEMOGLOBIN A1C  03/13/2024   FOOT EXAM  03/14/2024   Diabetic kidney evaluation - eGFR measurement  09/30/2024   Diabetic kidney evaluation - Urine ACR  09/30/2024   Medicare Annual Wellness (AWV)  10/05/2024   Cervical Cancer Screening (HPV/Pap Cotest)  09/14/2025   Hepatitis C Screening  Completed   HIV Screening  Completed   HPV VACCINES  Aged Out    Health Maintenance  Health  Maintenance Due  Topic Date Due   DTaP/Tdap/Td (1 - Tdap) Never done   Pneumococcal Vaccine 77-89 Years old (2 of 2 - PPSV23 or PCV20) 03/17/2017   COVID-19 Vaccine (3 - Moderna risk series) 12/18/2019   INFLUENZA VACCINE  02/25/2023   OPHTHALMOLOGY EXAM  09/19/2023   Health Maintenance Items Addressed: Patient advised of health maintenance items due.   Additional Screening:  Vision Screening: Recommended annual ophthalmology exams for early detection of glaucoma and other disorders of the eye.  Dental Screening: Recommended annual dental exams for proper oral hygiene  Community Resource Referral / Chronic Care Management: CRR required this visit?  No   CCM required this visit?  No     Plan:     I have personally reviewed and noted the following in the patient's chart:   Medical and social history Use of alcohol, tobacco or illicit drugs  Current medications and supplements including opioid prescriptions. Patient is not currently taking opioid prescriptions. Functional ability and status Nutritional status Physical activity Advanced directives List of other physicians Hospitalizations, surgeries, and ER visits in previous 12 months Vitals Screenings to include cognitive, depression, and falls Referrals and appointments  In addition, I have reviewed and discussed with patient certain preventive protocols, quality metrics, and best practice recommendations. A written personalized care plan for preventive services as well as general preventive health recommendations were provided to patient.     Jordan Hawks Kazmir Oki, CMA   10/06/2023   After Visit Summary: (MyChart) Due to this being a telephonic visit, the after visit summary with patients personalized plan was offered to patient via MyChart   Notes: Nothing significant to report at this time.

## 2023-10-07 ENCOUNTER — Other Ambulatory Visit: Payer: Self-pay | Admitting: Family Medicine

## 2023-10-15 DIAGNOSIS — E119 Type 2 diabetes mellitus without complications: Secondary | ICD-10-CM | POA: Diagnosis not present

## 2023-10-21 ENCOUNTER — Encounter: Payer: 59 | Admitting: Family Medicine

## 2023-10-21 ENCOUNTER — Encounter: Payer: Self-pay | Admitting: Family Medicine

## 2023-10-23 ENCOUNTER — Other Ambulatory Visit: Payer: Self-pay | Admitting: Family Medicine

## 2023-10-26 ENCOUNTER — Ambulatory Visit (INDEPENDENT_AMBULATORY_CARE_PROVIDER_SITE_OTHER): Payer: 59 | Admitting: Family Medicine

## 2023-10-26 ENCOUNTER — Ambulatory Visit (HOSPITAL_COMMUNITY)
Admission: RE | Admit: 2023-10-26 | Discharge: 2023-10-26 | Disposition: A | Discharge status: Home / Self Care | Source: Ambulatory Visit | Attending: Family Medicine | Admitting: Family Medicine

## 2023-10-26 ENCOUNTER — Encounter: Payer: Self-pay | Admitting: Family Medicine

## 2023-10-26 ENCOUNTER — Encounter (INDEPENDENT_AMBULATORY_CARE_PROVIDER_SITE_OTHER): Payer: Self-pay

## 2023-10-26 ENCOUNTER — Other Ambulatory Visit (HOSPITAL_COMMUNITY)
Admission: RE | Admit: 2023-10-26 | Discharge: 2023-10-26 | Disposition: A | Discharge status: Home / Self Care | Source: Ambulatory Visit | Attending: Family Medicine | Admitting: Family Medicine

## 2023-10-26 VITALS — BP 120/68 | HR 77 | Temp 97.8°F | Resp 18 | Ht 67.0 in

## 2023-10-26 DIAGNOSIS — R059 Cough, unspecified: Secondary | ICD-10-CM

## 2023-10-26 DIAGNOSIS — R300 Dysuria: Secondary | ICD-10-CM

## 2023-10-26 DIAGNOSIS — Z20828 Contact with and (suspected) exposure to other viral communicable diseases: Secondary | ICD-10-CM | POA: Diagnosis not present

## 2023-10-26 DIAGNOSIS — R509 Fever, unspecified: Secondary | ICD-10-CM | POA: Diagnosis not present

## 2023-10-26 LAB — CBC WITH DIFFERENTIAL/PLATELET
Abs Immature Granulocytes: 0.04 10*3/uL (ref 0.00–0.07)
Basophils Absolute: 0.1 10*3/uL (ref 0.0–0.1)
Basophils Relative: 1 %
Eosinophils Absolute: 0.6 10*3/uL — ABNORMAL HIGH (ref 0.0–0.5)
Eosinophils Relative: 7 %
HCT: 42 % (ref 36.0–46.0)
Hemoglobin: 13.4 g/dL (ref 12.0–15.0)
Immature Granulocytes: 1 %
Lymphocytes Relative: 27 %
Lymphs Abs: 2.3 10*3/uL (ref 0.7–4.0)
MCH: 27.5 pg (ref 26.0–34.0)
MCHC: 31.9 g/dL (ref 30.0–36.0)
MCV: 86.2 fL (ref 80.0–100.0)
Monocytes Absolute: 1 10*3/uL (ref 0.1–1.0)
Monocytes Relative: 12 %
Neutro Abs: 4.6 10*3/uL (ref 1.7–7.7)
Neutrophils Relative %: 52 %
Platelets: 222 10*3/uL (ref 150–400)
RBC: 4.87 MIL/uL (ref 3.87–5.11)
RDW: 16.2 % — ABNORMAL HIGH (ref 11.5–15.5)
WBC: 8.7 10*3/uL (ref 4.0–10.5)
nRBC: 0 % (ref 0.0–0.2)

## 2023-10-26 LAB — COMPREHENSIVE METABOLIC PANEL WITH GFR
ALT: 22 U/L (ref 0–44)
AST: 21 U/L (ref 15–41)
Albumin: 3.7 g/dL (ref 3.5–5.0)
Alkaline Phosphatase: 72 U/L (ref 38–126)
Anion gap: 10 (ref 5–15)
BUN: 8 mg/dL (ref 6–20)
CO2: 23 mmol/L (ref 22–32)
Calcium: 8.3 mg/dL — ABNORMAL LOW (ref 8.9–10.3)
Chloride: 100 mmol/L (ref 98–111)
Creatinine, Ser: <0.3 mg/dL — ABNORMAL LOW (ref 0.44–1.00)
Glucose, Bld: 92 mg/dL (ref 70–99)
Potassium: 3.8 mmol/L (ref 3.5–5.1)
Sodium: 133 mmol/L — ABNORMAL LOW (ref 135–145)
Total Bilirubin: 0.4 mg/dL (ref 0.0–1.2)
Total Protein: 7.6 g/dL (ref 6.5–8.1)

## 2023-10-26 MED ORDER — OLMESARTAN MEDOXOMIL 20 MG PO TABS: 20.0000 mg | ORAL_TABLET | Freq: Every day | ORAL | 1 refills | Status: DC

## 2023-10-26 MED ORDER — METOPROLOL SUCCINATE ER 25 MG PO TB24: 25.0000 mg | ORAL_TABLET | Freq: Every day | ORAL | 1 refills | Status: DC

## 2023-10-26 MED ORDER — MONTELUKAST SODIUM 10 MG PO TABS: 10.0000 mg | ORAL_TABLET | Freq: Every day | ORAL | 3 refills | Status: AC

## 2023-10-26 MED ORDER — DOXYCYCLINE HYCLATE 100 MG PO TABS: 100.0000 mg | ORAL_TABLET | Freq: Two times a day (BID) | ORAL | 0 refills | Status: DC

## 2023-10-26 MED ORDER — CEFDINIR 300 MG PO CAPS: 300.0000 mg | ORAL_CAPSULE | Freq: Two times a day (BID) | ORAL | 0 refills | Status: DC

## 2023-10-26 NOTE — Patient Instructions (Signed)
 Annual exam  IN OFFICE WITH Md IN 5 WEEKS, CALL IF YOU NEED ME SOONER  CXR AT HOSPITAL TODAY, YOU WILL BE CONTACTED WITH  REPORT  CBC AND DIFF CHEM 7 AND EGFR AT HOSPITAL LAB TODAY STAT  NEED SWAB FOR RSV, FLU AND COVID TODAY (HOSPITAL LAB  IF POSSIBLE, IF NOT GET SWAB IN OFFICE LAB BEFORE YOU LEAVE)  Two antibiotics, omnicef and doxycycline are prescribed for 10 days total, you are being treated for bronchitis  Need to send back sputum for culture and sensitivity to office for testing since you report green sputum with fever and chills  Need to send back urine specimen for culture since you report burning with urination  Thanks for choosing [facility], we consider it a privelige to serve you.

## 2023-10-27 ENCOUNTER — Ambulatory Visit

## 2023-10-27 ENCOUNTER — Other Ambulatory Visit: Payer: Self-pay | Admitting: Family Medicine

## 2023-10-27 DIAGNOSIS — R059 Cough, unspecified: Secondary | ICD-10-CM

## 2023-10-27 DIAGNOSIS — R350 Frequency of micturition: Secondary | ICD-10-CM | POA: Diagnosis not present

## 2023-10-28 ENCOUNTER — Encounter: Payer: Self-pay | Admitting: Family Medicine

## 2023-10-28 LAB — UA/M W/RFLX CULTURE, ROUTINE
Bilirubin, UA: NEGATIVE
Ketones, UA: NEGATIVE
Leukocytes,UA: NEGATIVE
Nitrite, UA: NEGATIVE
RBC, UA: NEGATIVE
Specific Gravity, UA: >=1.03 — AB (ref 1.005–1.030)
Urobilinogen, Ur: 1 mg/dL (ref 0.2–1.0)
pH, UA: 6 (ref 5.0–7.5)

## 2023-10-28 LAB — MICROSCOPIC EXAMINATION
Casts: NONE SEEN /LPF
RBC, Urine: NONE SEEN /HPF (ref 0–2)

## 2023-10-29 ENCOUNTER — Encounter: Payer: Self-pay | Admitting: Family Medicine

## 2023-10-29 LAB — COVID-19, FLU A+B AND RSV
Influenza A, NAA: NOT DETECTED
Influenza B, NAA: NOT DETECTED
RSV, NAA: DETECTED — AB
SARS-CoV-2, NAA: NOT DETECTED

## 2023-11-01 ENCOUNTER — Encounter: Payer: Self-pay | Admitting: Family Medicine

## 2023-11-01 DIAGNOSIS — Z20828 Contact with and (suspected) exposure to other viral communicable diseases: Secondary | ICD-10-CM | POA: Insufficient documentation

## 2023-11-01 DIAGNOSIS — R059 Cough, unspecified: Secondary | ICD-10-CM | POA: Insufficient documentation

## 2023-11-01 DIAGNOSIS — R300 Dysuria: Secondary | ICD-10-CM | POA: Insufficient documentation

## 2023-11-01 LAB — GRAM STAIN W/SPUTUM CULT RFLX

## 2023-11-01 LAB — SPUTUM CULTURE

## 2023-11-01 NOTE — Progress Notes (Signed)
   Janet Acevedo     MRN: [id number]      DOB: [birth year]  Chief Complaint  Patient presents with   Cough    Productive cough, congestion, sore throat x1 week after rsv exposure. Taking tylenol and mucinex but no improvement.   Fever    Fever since Sunday night 101.3.     HPI   Dutta is here with above concerns Recently hospitalized with pneumonia, mow with acute onset of febrile respiratory illness with known RSV exposure ROS C/o  recent fever or chills C/o  sinus pressure, nasal congestion, denies ear pain or sore throat. C/o chest congestion, productive cough  wheezing.spiutum is green Denies chest pains, palpitations and leg swelling Denies abdominal pain, nausea, vomiting,diarrhea or constipation.   C/o dysuria,  PE  BP 120/68   Pulse 77   Temp 97.8 F (36.6 C)   Resp 18   Ht 5\' 7"  (1.702 m)   SpO2 96%   BMI 27.41 kg/m   Patient alert and oriented and in no cardiopulmonary distress.Ill appearing  HEENT: No facial asymmetry, EOMI,     No sinus tenderness, no cervical adenopathy .  Chest: decrased air entry , scatteered crackles and wheezes CVS: S1, S2 no murmurs, no S3.Regular rate.  ABD: Soft non tender.   Ext: No edema  MS: decreased  ROM spine, shoulders, hips and knees.  Skin: Intact, no ulcerations or rash noted.  Psych: Good eye contact, normal affect. Memory intact not anxious or depressed appearing.  CNS: CN 2-12 intact, power,  normal throughout.no focal deficits noted.   Assessment & Plan  Cough in adult Symptomatic , with compromised lung function, treat wit h doxy and omniceg , sputm c/S, CXR and baselinge labs Iral testing alpos , recent RSV exposure  RSV exposure Exposure ithoinpast 1 week, acute respillness, test for RSV , covid and flu  Dysuria Urine for c/s only, will treat nased on result

## 2023-11-01 NOTE — Assessment & Plan Note (Signed)
 Urine for c/s only, will treat nased on result

## 2023-11-01 NOTE — Assessment & Plan Note (Signed)
 Exposure ithoinpast 1 week, acute respillness, test for RSV , covid and flu

## 2023-11-01 NOTE — Assessment & Plan Note (Signed)
 Symptomatic , with compromised lung function, treat wit h doxy and omniceg , sputm c/S, CXR and baselinge labs Iral testing alpos , recent RSV exposure

## 2023-11-02 ENCOUNTER — Ambulatory Visit (INDEPENDENT_AMBULATORY_CARE_PROVIDER_SITE_OTHER): Payer: 59 | Admitting: Gastroenterology

## 2023-11-02 ENCOUNTER — Encounter (INDEPENDENT_AMBULATORY_CARE_PROVIDER_SITE_OTHER): Payer: Self-pay | Admitting: Gastroenterology

## 2023-11-02 VITALS — Ht 67.0 in | Wt 175.0 lb

## 2023-11-02 DIAGNOSIS — K219 Gastro-esophageal reflux disease without esophagitis: Secondary | ICD-10-CM

## 2023-11-02 NOTE — Progress Notes (Unsigned)
   I attempted to connect with the patient x3 via telephone, unsuccessfully. Patient will need to be rescheduled to a later date for her GERD follow up      Janet Leon L. [name], MSN, APRN, AGNP-C Adult-Gerontology Nurse Practitioner [facility]

## 2023-12-04 ENCOUNTER — Encounter: Payer: Self-pay | Admitting: Family Medicine

## 2023-12-15 ENCOUNTER — Encounter: Payer: Self-pay | Admitting: Family Medicine

## 2024-01-06 ENCOUNTER — Encounter: Payer: Self-pay | Admitting: Family Medicine

## 2024-01-06 ENCOUNTER — Ambulatory Visit (INDEPENDENT_AMBULATORY_CARE_PROVIDER_SITE_OTHER): Admitting: Family Medicine

## 2024-01-06 VITALS — BP 103/79 | HR 67 | Resp 16 | Ht 67.0 in

## 2024-01-06 DIAGNOSIS — Z23 Encounter for immunization: Secondary | ICD-10-CM

## 2024-01-06 DIAGNOSIS — E559 Vitamin D deficiency, unspecified: Secondary | ICD-10-CM | POA: Diagnosis not present

## 2024-01-06 DIAGNOSIS — I10 Essential (primary) hypertension: Secondary | ICD-10-CM | POA: Diagnosis not present

## 2024-01-06 DIAGNOSIS — Z0001 Encounter for general adult medical examination with abnormal findings: Secondary | ICD-10-CM | POA: Diagnosis not present

## 2024-01-06 DIAGNOSIS — E1169 Type 2 diabetes mellitus with other specified complication: Secondary | ICD-10-CM | POA: Diagnosis not present

## 2024-01-06 DIAGNOSIS — E785 Hyperlipidemia, unspecified: Secondary | ICD-10-CM

## 2024-01-06 MED ORDER — SERTRALINE HCL 50 MG PO TABS: 50.0000 mg | ORAL_TABLET | Freq: Every day | ORAL | 2 refills | Status: AC

## 2024-01-06 MED ORDER — EMPAGLIFLOZIN 10 MG PO TABS: 10.0000 mg | ORAL_TABLET | Freq: Every day | ORAL | 3 refills | Status: AC

## 2024-01-06 MED ORDER — ROSUVASTATIN CALCIUM 5 MG PO TABS: 5.0000 mg | ORAL_TABLET | Freq: Every day | ORAL | 3 refills | Status: AC

## 2024-01-06 MED ORDER — CLOTRIMAZOLE-BETAMETHASONE 1-0.05 % EX CREA: 1.0000 | TOPICAL_CREAM | Freq: Two times a day (BID) | CUTANEOUS | 11 refills | Status: AC

## 2024-01-06 MED ORDER — BUSPIRONE HCL 7.5 MG PO TABS: 7.5000 mg | ORAL_TABLET | Freq: Two times a day (BID) | ORAL | 3 refills | Status: AC

## 2024-01-06 MED ORDER — CROMOLYN SODIUM 4 % OP SOLN: OPHTHALMIC | 8 refills | Status: AC

## 2024-01-06 MED ORDER — KETOCONAZOLE 2 % EX SHAM: MEDICATED_SHAMPOO | CUTANEOUS | 4 refills | Status: AC

## 2024-01-06 NOTE — Patient Instructions (Addendum)
 F/U in [place], call if you need me sooner  Nurse pls send for eye exam , [facility] in [place] done in March, 2025  Pneumonia 20 in office today  CBC, cmp and eGFR, TSH, Vit D and hBa1C today  You do need TdAP and covid vaccines  Best with upcoming study  Thanks for choosing [facility], we consider it a privelige to serve you.

## 2024-01-07 ENCOUNTER — Ambulatory Visit: Payer: Self-pay | Admitting: Family Medicine

## 2024-01-07 ENCOUNTER — Other Ambulatory Visit: Payer: Self-pay

## 2024-01-07 DIAGNOSIS — I10 Essential (primary) hypertension: Secondary | ICD-10-CM

## 2024-01-07 DIAGNOSIS — E785 Hyperlipidemia, unspecified: Secondary | ICD-10-CM

## 2024-01-07 DIAGNOSIS — E1169 Type 2 diabetes mellitus with other specified complication: Secondary | ICD-10-CM

## 2024-01-07 LAB — CMP14+EGFR
ALT: 20 IU/L (ref 0–32)
AST: 17 IU/L (ref 0–40)
Albumin: 4.2 g/dL (ref 3.9–4.9)
Alkaline Phosphatase: 106 IU/L (ref 44–121)
BUN/Creatinine Ratio: 53 — ABNORMAL HIGH (ref 9–23)
BUN: 8 mg/dL (ref 6–20)
Bilirubin Total: 0.3 mg/dL (ref 0.0–1.2)
CO2: 24 mmol/L (ref 20–29)
Calcium: 8.8 mg/dL (ref 8.7–10.2)
Chloride: 101 mmol/L (ref 96–106)
Creatinine, Ser: 0.15 mg/dL — ABNORMAL LOW (ref 0.57–1.00)
Globulin, Total: 3 g/dL (ref 1.5–4.5)
Glucose: 97 mg/dL (ref 70–99)
Potassium: 4.1 mmol/L (ref 3.5–5.2)
Sodium: 140 mmol/L (ref 134–144)
Total Protein: 7.2 g/dL (ref 6.0–8.5)
eGFR: 171 mL/min/{1.73_m2} (ref >59)

## 2024-01-07 LAB — TSH: TSH: 2.39 u[IU]/mL (ref 0.450–4.500)

## 2024-01-07 LAB — CBC WITH DIFFERENTIAL/PLATELET
Basophils Absolute: 0.1 10*3/uL (ref 0.0–0.2)
Basos: 1 %
EOS (ABSOLUTE): 0.4 10*3/uL (ref 0.0–0.4)
Eos: 4 %
Hematocrit: 44.7 % (ref 34.0–46.6)
Hemoglobin: 14 g/dL (ref 11.1–15.9)
Immature Grans (Abs): 0 10*3/uL (ref 0.0–0.1)
Immature Granulocytes: 0 %
Lymphocytes Absolute: 3.3 10*3/uL — ABNORMAL HIGH (ref 0.7–3.1)
Lymphs: 33 %
MCH: 27 pg (ref 26.6–33.0)
MCHC: 31.3 g/dL — ABNORMAL LOW (ref 31.5–35.7)
MCV: 86 fL (ref 79–97)
Monocytes Absolute: 0.8 10*3/uL (ref 0.1–0.9)
Monocytes: 8 %
Neutrophils Absolute: 5.5 10*3/uL (ref 1.4–7.0)
Neutrophils: 54 %
Platelets: 213 10*3/uL (ref 150–450)
RBC: 5.18 x10E6/uL (ref 3.77–5.28)
RDW: 13.8 % (ref 11.7–15.4)
WBC: 10 10*3/uL (ref 3.4–10.8)

## 2024-01-07 LAB — VITAMIN D 25 HYDROXY (VIT D DEFICIENCY, FRACTURES): Vit D, 25-Hydroxy: 34.5 ng/mL (ref 30.0–100.0)

## 2024-01-07 LAB — HEMOGLOBIN A1C
Est. average glucose Bld gHb Est-mCnc: 143 mg/dL
Hgb A1c MFr Bld: 6.6 % — ABNORMAL HIGH (ref 4.8–5.6)

## 2024-01-18 ENCOUNTER — Other Ambulatory Visit: Payer: Self-pay | Admitting: Family Medicine

## 2024-02-08 ENCOUNTER — Encounter: Payer: Self-pay | Admitting: Family Medicine

## 2024-02-08 DIAGNOSIS — Z23 Encounter for immunization: Secondary | ICD-10-CM | POA: Insufficient documentation

## 2024-02-08 DIAGNOSIS — Z0001 Encounter for general adult medical examination with abnormal findings: Secondary | ICD-10-CM | POA: Insufficient documentation

## 2024-02-08 NOTE — Progress Notes (Signed)
 Janet Acevedo     MRN: [id number]      DOB: 04-22-1991  Chief Complaint  Patient presents with   Annual Exam    Cpe     HPI: Patient is in for annual physical exam. Will be participating in a study on spinal muscular atrophy. She is hopeful  not only to be able to  get some benefit for herself but also for others with the disease. Labs drawn on day of visit so review will be sent after visit. Immunization is reviewed , and  updated   PE: BP 103/79   Pulse 67   Resp 16   Ht 5' 7 (1.702 m)   SpO2 96%   BMI 27.41 kg/m   Pleasant  female, alert and oriented x 3, in no cardio-pulmonary distress. Afebrile. HEENT No facial trauma or asymetry. Sinuses non tender.  Extra occullar muscles intact.. External ears normal, . Neck: decreased ROM, no adenopathy,JVD or thyromegaly.No bruits.  Chest: Clear to ascultation bilaterally.No crackles or wheezes. Non tender to palpation  Cardiovascular system; Heart sounds normal,  S1 and  S2 ,no S3.  No murmur, or thrill. Peripheral pulses normal.  Abdomen: Soft, non tender, no organomegaly or masses.  Musculoskeletal exam: Decreased  ROM of spine, hips , shoulders and knees. . muscle wasting and  atrophy. Present in all extremities  Neurologic: Cranial nerves 2 to 12 intact. Garde 0 power in lower extremities with reduced tone in all extremeties Grade 2 power in upper extremities, sensation normal throughout Skin: Intact, no ulceration, erythema , scaling or rash noted. Pigmentation normal throughout  Psych; Normal mood and affect. Judgement and concentration normal   Assessment & Plan:  Encounter for Medicare annual examination with abnormal findings Annual exam as documented.  Immunization and cancer screening needs are specifically addressed at this visit.   Encounter for immunization After obtaining informed consent, the  pneumonia 20 vaccine is  administered , with no adverse effect noted at the time of  administration.   Type 2 diabetes mellitus with other specified complication (HCC) Diabetes associated with hypertension, hyperlipidemia, obesity, neurological disease, and depression    Depaoli is reminded of the importance of commitment to daily physical activity for 30 minutes or more, as able and the need to limit carbohydrate intake to 30 to 60 grams per meal to help with blood sugar control.   The need to take medication as prescribed, test blood sugar as directed, and to call between visits if there is a concern that blood sugar is uncontrolled is also discussed.     Sonnier is reminded of the importance of daily foot exam, annual eye examination, and good blood sugar, blood pressure and cholesterol control.     Latest Ref Rng & Units 01/06/2024    2:09 PM 10/26/2023    4:29 PM 10/01/2023   12:30 PM 10/01/2023   12:17 PM 09/16/2023    3:06 AM  Diabetic Labs  HbA1c 4.8 - 5.6 % 6.6       Micro/Creat Ratio 0 - 29 mg/g creat   25     Chol 100 - 199 mg/dL    869    HDL >60 mg/dL    30    Calc LDL 0 - 99 mg/dL    84    Triglycerides 0 - 149 mg/dL    81    Creatinine 9.42 - 1.00 mg/dL 9.84  <9.69   9.83  <9.69       01/06/2024  1:21 PM 11/02/2023   10:32 AM 10/26/2023    3:03 PM 10/06/2023    2:50 PM 10/04/2023    8:12 AM 09/16/2023    4:22 AM 09/15/2023    8:47 PM  BP/Weight  Systolic BP 103  879 -- 127 137 124  Diastolic BP 79  68 -- 88 91 84  Wt. (Lbs)  175  175     BMI  27.41 kg/m2  27.41 kg/m2         Latest Ref Rng & Units 09/18/2022   12:00 AM  Foot/eye exam completion dates  Eye Exam No Retinopathy No Retinopathy         This result is from an external source.

## 2024-02-08 NOTE — Assessment & Plan Note (Signed)
 After obtaining informed consent, the pneumonia 20  vaccine is  administered , with no adverse effect noted at the time of administration.

## 2024-02-08 NOTE — Assessment & Plan Note (Signed)
 Annual exam as documented. . Immunization and cancer screening needs are specifically addressed at this visit.

## 2024-02-08 NOTE — Assessment & Plan Note (Signed)
 Diabetes associated with hypertension, hyperlipidemia, obesity, neurological disease, and depression    Reino is reminded of the importance of commitment to daily physical activity for 30 minutes or more, as able and the need to limit carbohydrate intake to 30 to 60 grams per meal to help with blood sugar control.   The need to take medication as prescribed, test blood sugar as directed, and to call between visits if there is a concern that blood sugar is uncontrolled is also discussed.     Veiga is reminded of the importance of daily foot exam, annual eye examination, and good blood sugar, blood pressure and cholesterol control.     Latest Ref Rng & Units 01/06/2024    2:09 PM 10/26/2023    4:29 PM 10/01/2023   12:30 PM 10/01/2023   12:17 PM 09/16/2023    3:06 AM  Diabetic Labs  HbA1c 4.8 - 5.6 % 6.6       Micro/Creat Ratio 0 - 29 mg/g creat   25     Chol 100 - 199 mg/dL    869    HDL >60 mg/dL    30    Calc LDL 0 - 99 mg/dL    84    Triglycerides 0 - 149 mg/dL    81    Creatinine 9.42 - 1.00 mg/dL 9.84  <9.69   9.83  <9.69       01/06/2024    1:21 PM 11/02/2023   10:32 AM 10/26/2023    3:03 PM 10/06/2023    2:50 PM 10/04/2023    8:12 AM 09/16/2023    4:22 AM 09/15/2023    8:47 PM  BP/Weight  Systolic BP 103  879 -- 127 137 124  Diastolic BP 79  68 -- 88 91 84  Wt. (Lbs)  175  175     BMI  27.41 kg/m2  27.41 kg/m2         Latest Ref Rng & Units 09/18/2022   12:00 AM  Foot/eye exam completion dates  Eye Exam No Retinopathy No Retinopathy         This result is from an external source.

## 2024-02-10 DIAGNOSIS — E559 Vitamin D deficiency, unspecified: Secondary | ICD-10-CM | POA: Diagnosis not present

## 2024-02-10 DIAGNOSIS — N189 Chronic kidney disease, unspecified: Secondary | ICD-10-CM | POA: Diagnosis not present

## 2024-02-10 DIAGNOSIS — I10 Essential (primary) hypertension: Secondary | ICD-10-CM | POA: Diagnosis not present

## 2024-02-10 DIAGNOSIS — D631 Anemia in chronic kidney disease: Secondary | ICD-10-CM | POA: Diagnosis not present

## 2024-02-10 DIAGNOSIS — E119 Type 2 diabetes mellitus without complications: Secondary | ICD-10-CM | POA: Diagnosis not present

## 2024-02-10 DIAGNOSIS — R809 Proteinuria, unspecified: Secondary | ICD-10-CM | POA: Diagnosis not present

## 2024-02-15 DIAGNOSIS — N182 Chronic kidney disease, stage 2 (mild): Secondary | ICD-10-CM | POA: Diagnosis not present

## 2024-02-15 DIAGNOSIS — R809 Proteinuria, unspecified: Secondary | ICD-10-CM | POA: Diagnosis not present

## 2024-02-15 DIAGNOSIS — E1129 Type 2 diabetes mellitus with other diabetic kidney complication: Secondary | ICD-10-CM | POA: Diagnosis not present

## 2024-03-08 ENCOUNTER — Other Ambulatory Visit: Payer: Self-pay | Admitting: Family Medicine

## 2024-04-26 ENCOUNTER — Other Ambulatory Visit: Payer: Self-pay | Admitting: Family Medicine

## 2024-04-26 DIAGNOSIS — J4541 Moderate persistent asthma with (acute) exacerbation: Secondary | ICD-10-CM

## 2024-04-26 DIAGNOSIS — J069 Acute upper respiratory infection, unspecified: Secondary | ICD-10-CM

## 2024-04-29 ENCOUNTER — Other Ambulatory Visit: Payer: Self-pay | Admitting: Family Medicine

## 2024-04-29 ENCOUNTER — Telehealth: Admitting: Nurse Practitioner

## 2024-04-29 DIAGNOSIS — J4541 Moderate persistent asthma with (acute) exacerbation: Secondary | ICD-10-CM

## 2024-04-29 MED ORDER — PREDNISONE 20 MG PO TABS: 20.0000 mg | ORAL_TABLET | Freq: Every day | ORAL | 0 refills | Status: AC

## 2024-04-29 MED ORDER — AZITHROMYCIN 250 MG PO TABS: ORAL_TABLET | ORAL | 0 refills | Status: AC

## 2024-04-29 MED ORDER — ALBUTEROL SULFATE HFA 108 (90 BASE) MCG/ACT IN AERS: 1.0000 | INHALATION_SPRAY | Freq: Four times a day (QID) | RESPIRATORY_TRACT | 0 refills | Status: AC | PRN

## 2024-04-29 NOTE — Progress Notes (Signed)
 We are sorry that you are not feeling well.  Here is how we plan to help!  FIRST I RECOMMEND YOU TAKE A COVID TEST!  Based on your presentation I believe you most likely have A cough due to bacteria.  When patients have a fever and a productive cough with a change in color or increased sputum production, we are concerned about bacterial bronchitis.  If left untreated it can progress to pneumonia.  If your symptoms do not improve with your treatment plan it is important that you contact your provider.   I have prescribed Azithromyin 250 mg: two tablets now and then one tablet daily for 4 additonal days    In addition you may use your albuterol  inhaler and I have sent a few days of prednisone .     From your responses in the eVisit questionnaire you describe inflammation in the upper respiratory tract which is causing a significant cough.  This is commonly called Bronchitis and has four common causes:   Allergies Viral Infections Acid Reflux Bacterial Infection Allergies, viruses and acid reflux are treated by controlling symptoms or eliminating the cause. An example might be a cough caused by taking certain blood pressure medications. You stop the cough by changing the medication. Another example might be a cough caused by acid reflux. Controlling the reflux helps control the cough.  USE OF BRONCHODILATOR (RESCUE) INHALERS: There is a risk from using your bronchodilator too frequently.  The risk is that over-reliance on a medication which only relaxes the muscles surrounding the breathing tubes can reduce the effectiveness of medications prescribed to reduce swelling and congestion of the tubes themselves.  Although you feel brief relief from the bronchodilator inhaler, your asthma may actually be worsening with the tubes becoming more swollen and filled with mucus.  This can delay other crucial treatments, such as oral steroid medications. If you need to use a bronchodilator inhaler daily, several  times per day, you should discuss this with your provider.  There are probably better treatments that could be used to keep your asthma under control.     HOME CARE Only take medications as instructed by your medical team. Complete the entire course of an antibiotic. Drink plenty of fluids and get plenty of rest. Avoid close contacts especially the very young and the elderly Cover your mouth if you cough or cough into your sleeve. Always remember to wash your hands A steam or ultrasonic humidifier can help congestion.   GET HELP RIGHT AWAY IF: You develop worsening fever. You become short of breath You cough up blood. Your symptoms persist after you have completed your treatment plan MAKE SURE YOU  Understand these instructions. Will watch your condition. Will get help right away if you are not doing well or get worse.  Your e-visit answers were reviewed by a board certified advanced clinical practitioner to complete your personal care plan.  Depending on the condition, your plan could have included both over the counter or prescription medications. If there is a problem please reply  once you have received a response from your provider. Your safety is important to us .  If you have drug allergies check your prescription carefully.    You can use MyChart to ask questions about today's visit, request a non-urgent call back, or ask for a work or school excuse for 24 hours related to this e-Visit. If it has been greater than 24 hours you will need to follow up with your provider, or enter a  new e-Visit to address those concerns. You will get an e-mail in the next two days asking about your experience.  I hope that your e-visit has been valuable and will speed your recovery. Thank you for using e-visits.   I have spent 5 minutes in review of e-visit questionnaire, review and updating patient chart, medical decision making and response to patient.   Sevastian Witczak W Jamone Garrido, NP

## 2024-05-08 ENCOUNTER — Telehealth: Payer: Self-pay | Admitting: Physician Assistant

## 2024-05-08 NOTE — Telephone Encounter (Signed)
 Pt c/o medication issue:  1. Name of Medication: Azithromycin   2. How are you currently taking this medication (dosage and times per day)? Just completed  3. Are you having a reaction (difficulty breathing--STAT)? no  4. What is your medication issue? Pt was recently was on Azithromycin  and wants to discuss if it has affected her heart

## 2024-05-10 NOTE — Telephone Encounter (Signed)
 We are not putting nurse visits on at this time.

## 2024-05-15 ENCOUNTER — Encounter: Payer: Self-pay | Admitting: Family Medicine

## 2024-05-16 ENCOUNTER — Other Ambulatory Visit: Payer: Self-pay | Admitting: Internal Medicine

## 2024-05-16 DIAGNOSIS — N3000 Acute cystitis without hematuria: Secondary | ICD-10-CM

## 2024-05-16 NOTE — Telephone Encounter (Signed)
 Patient called back, scheduled virtual with Dr Tobie

## 2024-05-16 NOTE — Telephone Encounter (Signed)
 Left voicemail to call office and message patient to schedule virtual visit tomorrow.

## 2024-05-17 ENCOUNTER — Telehealth: Admitting: Internal Medicine

## 2024-05-17 ENCOUNTER — Encounter: Payer: Self-pay | Admitting: Internal Medicine

## 2024-05-17 DIAGNOSIS — N3000 Acute cystitis without hematuria: Secondary | ICD-10-CM

## 2024-05-17 MED ORDER — SULFAMETHOXAZOLE-TRIMETHOPRIM 800-160 MG PO TABS: 1.0000 | ORAL_TABLET | Freq: Two times a day (BID) | ORAL | 0 refills | Status: DC

## 2024-05-17 NOTE — Progress Notes (Signed)
 Virtual Visit via Video Note   Because of Janet Acevedo's co-morbid illnesses, Janet Acevedo is at least at moderate risk for complications without adequate follow up.  This format is felt to be most appropriate for this patient at this time.  All issues noted in this document were discussed and addressed.  A limited physical exam was performed with this format.      Evaluation Performed:  Follow-up visit  Date:  05/17/2024   ID:  Janet Acevedo, Janet Acevedo [birth year], MRN [id number]  Patient Location: Home Provider Location: Office/Clinic  Participants: Patient Location of Patient: Home Location of Provider: Telehealth Consent was obtain for visit to be over via telehealth. I verified that I am speaking with the correct person using two identifiers.  PCP:  Antonetta Rollene BRAVO, MD   Chief Complaint: Lower abdominal pain and urinary urgency  History of Present Illness:    Janet Acevedo is a 33 y.o. adult who has a video visit for complaint of lower abdominal pain, urinary urgency and nausea for the last 3 days.  Denies any fever or chills currently.  Of note, she takes Jardiance  for type II DM.  The patient does not have symptoms concerning for COVID-19 infection (fever, chills, cough, or new shortness of breath).   Past Medical, Surgical, Social History, Allergies, and Medications have been Reviewed.  Past Medical History:  Diagnosis Date   Allergy    Annual visit for general adult medical examination with abnormal findings 07/17/2022   Anxiety    Asthma    Depression, major, single episode, moderate (HCC) 09/05/2017   GERD (gastroesophageal reflux disease)    Hypertension 05/07/2021   Neuromuscular disorder (HCC)    Osteomyelitis (HCC) June 2011   Otitis externa, eczematoid 05/28/2019   Perennial allergic rhinitis    Seborrheic dermatitis of scalp 2006   Spinal muscle atrophy (HCC)    type 2/3 18 months   Type 2 diabetes mellitus with other specified complication (HCC)  02/19/2022   Past Surgical History:  Procedure Laterality Date   bilateral hip reinsertion  under age 53   hips out of socket, pt was only able to cruise hold on and move short distances    BONE BIOPSY     spinal bone    COSMETIC SURGERY     MIRENA PLACED 10/27/10     SPINAL FUSION  07/27/1997   rods placed in thoracolumbar spine to excessive scoliosis primarily    SPINE SURGERY N/A    [employer] 07/29/2020     Current Meds  Medication Sig   ACCU-CHEK GUIDE test strip USE AS INSTRUCTED ONCE DAILY TESTING DX E11.9   albuterol  (PROVENTIL ) (2.5 MG/3ML) 0.083% nebulizer solution Take 3 mLs (2.5 mg total) by nebulization every 6 (six) hours as needed for wheezing or shortness of breath.   albuterol  (VENTOLIN  HFA) 108 (90 Base) MCG/ACT inhaler Inhale 1-2 puffs into the lungs every 6 (six) hours as needed for wheezing or shortness of breath.   azelastine  (ASTELIN ) 0.1 % nasal spray Place 2 sprays into both nostrils 2 (two) times daily. Use in each nostril as directed   blood glucose meter kit and supplies Dispense based on patient and insurance preference. Once daily testing DX E11.9   busPIRone  (BUSPAR ) 7.5 MG tablet Take 1 tablet (7.5 mg total) by mouth 2 (two) times daily.   clotrimazole -betamethasone  (LOTRISONE ) cream Apply 1 Application topically 2 (two) times daily.   cromolyn  (OPTICROM ) 4 % ophthalmic solution PLACE 1 DROP INTO  BOTH EYES FOUR TIMES DAILY   empagliflozin  (JARDIANCE ) 10 MG TABS tablet Take 1 tablet (10 mg total) by mouth daily.   Fluocinolone  Acetonide Scalp 0.01 % OIL APPLY SPARINGLY TO SCALP TWO TIMES WEEKLY, AS NEEDED, FOR ITCH AND FLAKING   ketoconazole  (NIZORAL ) 2 % shampoo APPLY TO SCALP FOR 5 MINUTES AND RINSE OUT   Lancets 30G MISC Once daily testing dx e11.9   levocetirizine (XYZAL ) 5 MG tablet TAKE 1 TABLET BY MOUTH EVERY DAY IN THE EVENING (Patient taking differently: Take 5 mg by mouth in the morning.)   levonorgestrel (MIRENA) 20 MCG/DAY IUD 1 each by Intrauterine  route once.   metoprolol  succinate (TOPROL -XL) 25 MG 24 hr tablet Take 1 tablet (25 mg total) by mouth daily.   montelukast  (SINGULAIR ) 10 MG tablet Take 1 tablet (10 mg total) by mouth daily.   nystatin  (MYCOSTATIN /NYSTOP ) powder APPLY TO AFFECTED AREA 3 TIMES A DAY   olmesartan  (BENICAR ) 20 MG tablet Take 1 tablet (20 mg total) by mouth daily.   omeprazole  (PRILOSEC) 20 MG capsule TAKE 2 CAPSULES BY MOUTH TWICE DAILY - PATIENT HAS TROUBLE SWALLOWING   Respiratory Therapy Supplies (NEBULIZER/TUBING/MOUTHPIECE) KIT Use as directed. Insurance preference. Nebulizer kit and mask   Risdiplam (EVRYSDI) 0.75 MG/ML SOLR    rosuvastatin  (CRESTOR ) 5 MG tablet Take 1 tablet (5 mg total) by mouth daily.   sertraline  (ZOLOFT ) 50 MG tablet Take 1 tablet (50 mg total) by mouth daily.   Spacer/Aero-Holding Chambers (AEROCHAMBER PLUS) inhaler Use as instructed   sulfamethoxazole -trimethoprim  (BACTRIM  DS) 800-160 MG tablet Take 1 tablet by mouth 2 (two) times daily.   SYMBICORT  160-4.5 MCG/ACT inhaler INHALE 2 PUFFS INTO THE LUNGS TWICE A DAY   UNABLE TO FIND Gloves- 1 box per month as needed   UNABLE TO FIND Under pads- for use daily as needed   UNABLE TO FIND Incontinence liners- use as needed for incontinence Gloves- 1 box as needed for incontinence DX urinary incontinence   UNABLE TO FIND Nebulizer machine and tubing  DX J45.40     Allergies:   Other and Ciprofloxacin    ROS:   Please see the history of present illness. All other systems reviewed and are negative.   Labs/Other Tests and Data Reviewed:    Recent Labs: 09/14/2023: B Natriuretic Peptide 109.0 09/16/2023: Magnesium  1.5 01/06/2024: ALT 20; BUN 8; Creatinine, Ser 0.15; Hemoglobin 14.0; Platelets 213; Potassium 4.1; Sodium 140; TSH 2.390   Recent Lipid Panel Lab Results  Component Value Date/Time   CHOL 130 10/01/2023 12:17 PM   TRIG 81 10/01/2023 12:17 PM   HDL 30 (L) 10/01/2023 12:17 PM   CHOLHDL 3.9 02/26/2023 01:49 PM    CHOLHDL 4.6 10/06/2018 01:53 PM   LDLCALC 84 10/01/2023 12:17 PM   LDLCALC 140 (H) 10/06/2018 01:53 PM    Wt Readings from Last 3 Encounters:  11/02/23 175 lb (79.4 kg)  10/06/23 175 lb (79.4 kg)  09/15/23 151 lb 14.4 oz (68.9 kg)     Objective:    Vital Signs:  There were no vitals taken for this visit.   VITAL SIGNS:  reviewed GEN:  no acute distress EYES:  sclerae anicteric, EOMI - Extraocular Movements Intact RESPIRATORY:  normal respiratory effort, symmetric expansion NEURO:  alert and oriented x 3, no obvious focal deficit PSYCH:  normal affect  ASSESSMENT & PLAN:    Acute cystitis Check UA with reflex culture Started empiric Bactrim  considering her current symptoms Advised to maintain adequate hydration Azo as needed for  dysuria   I discussed the assessment and treatment plan with the patient. The patient was provided an opportunity to ask questions, and all were answered. The patient agreed with the plan and demonstrated an understanding of the instructions.   The patient was advised to call back or seek an in-person evaluation if the symptoms worsen or if the condition fails to improve as anticipated.  The above assessment and management plan was discussed with the patient. The patient verbalized understanding of and has agreed to the management plan.   Medication Adjustments/Labs and Tests Ordered: Current medicines are reviewed at length with the patient today.  Concerns regarding medicines are outlined above.   Tests Ordered: No orders of the defined types were placed in this encounter.   Medication Changes: Meds ordered this encounter  Medications   sulfamethoxazole -trimethoprim  (BACTRIM  DS) 800-160 MG tablet    Sig: Take 1 tablet by mouth 2 (two) times daily.    Dispense:  10 tablet    Refill:  0     Note: This dictation was prepared with Dragon dictation along with smaller phrase technology. Similar sounding words can be transcribed inadequately or  may not be corrected upon review. Any transcriptional errors that result from this process are unintentional.      Disposition:  Follow up  Signed, Suzzane MARLA Blanch, MD  05/17/2024 11:46 AM     [facility]

## 2024-05-17 NOTE — Patient Instructions (Signed)
 Please start taking Bactrim  as prescribed after submitting urine sample.  Please maintain at least 64 ounces of fluid intake in a day.

## 2024-05-19 ENCOUNTER — Telehealth: Payer: Self-pay

## 2024-05-19 ENCOUNTER — Encounter: Payer: Self-pay | Admitting: Internal Medicine

## 2024-05-19 NOTE — Telephone Encounter (Signed)
 Copied from CRM [id number]. Topic: Clinical - Prescription Issue >> May 19, 2024  3:59 PM Janet Acevedo wrote: Reason for CRM: Patient calle to report that bactrim  has an interaction with olmesartan  (BENICAR ) 20 MG tablet Patient would  like to review this with provider prior to taking medication

## 2024-05-22 ENCOUNTER — Other Ambulatory Visit: Payer: Self-pay | Admitting: Internal Medicine

## 2024-05-22 DIAGNOSIS — N3000 Acute cystitis without hematuria: Secondary | ICD-10-CM | POA: Diagnosis not present

## 2024-05-22 MED ORDER — CEPHALEXIN 500 MG PO CAPS: 500.0000 mg | ORAL_CAPSULE | Freq: Three times a day (TID) | ORAL | 0 refills | Status: DC

## 2024-05-22 NOTE — Telephone Encounter (Signed)
 Left detailed vm

## 2024-05-23 ENCOUNTER — Ambulatory Visit: Payer: Self-pay | Admitting: Internal Medicine

## 2024-05-23 LAB — UA/M W/RFLX CULTURE, ROUTINE
Bilirubin, UA: NEGATIVE
Ketones, UA: NEGATIVE
Leukocytes,UA: NEGATIVE
Nitrite, UA: NEGATIVE
Protein,UA: NEGATIVE
RBC, UA: NEGATIVE
Specific Gravity, UA: >=1.03 — AB (ref 1.005–1.030)
Urobilinogen, Ur: 1 mg/dL (ref 0.2–1.0)
pH, UA: 6 (ref 5.0–7.5)

## 2024-05-23 LAB — MICROSCOPIC EXAMINATION
Bacteria, UA: NONE SEEN
Casts: NONE SEEN /LPF
RBC, Urine: NONE SEEN /HPF (ref 0–2)
WBC, UA: NONE SEEN /HPF (ref 0–5)

## 2024-05-26 NOTE — Telephone Encounter (Signed)
 Already responded to by Dr Tobie, no additional response needed

## 2024-06-08 ENCOUNTER — Encounter: Payer: Self-pay | Admitting: Family Medicine

## 2024-06-08 ENCOUNTER — Ambulatory Visit: Admitting: Family Medicine

## 2024-06-08 VITALS — BP 115/79 | HR 76

## 2024-06-08 DIAGNOSIS — I10 Essential (primary) hypertension: Secondary | ICD-10-CM | POA: Diagnosis not present

## 2024-06-08 DIAGNOSIS — E559 Vitamin D deficiency, unspecified: Secondary | ICD-10-CM

## 2024-06-08 DIAGNOSIS — E785 Hyperlipidemia, unspecified: Secondary | ICD-10-CM

## 2024-06-08 DIAGNOSIS — Z23 Encounter for immunization: Secondary | ICD-10-CM

## 2024-06-08 DIAGNOSIS — E1159 Type 2 diabetes mellitus with other circulatory complications: Secondary | ICD-10-CM

## 2024-06-08 DIAGNOSIS — R55 Syncope and collapse: Secondary | ICD-10-CM

## 2024-06-08 DIAGNOSIS — E1169 Type 2 diabetes mellitus with other specified complication: Secondary | ICD-10-CM | POA: Diagnosis not present

## 2024-06-08 DIAGNOSIS — F411 Generalized anxiety disorder: Secondary | ICD-10-CM

## 2024-06-08 DIAGNOSIS — F321 Major depressive disorder, single episode, moderate: Secondary | ICD-10-CM

## 2024-06-08 DIAGNOSIS — R9431 Abnormal electrocardiogram [ECG] [EKG]: Secondary | ICD-10-CM

## 2024-06-08 NOTE — Patient Instructions (Addendum)
 F/U in 4 months  Nurse pls check and document vitals  Nurse pls send to [facility] for immunization record on pt, thinks she had HPV and hep B series  Flu vaccine today Labs ordered for today, please remove TSH and vit D, keep the rest  I will message pharmacy re safe meds for you with prolonged QT  Will refer you to [facility]  Thanks for choosing [facility], we consider it a privelige to serve you.

## 2024-06-09 ENCOUNTER — Ambulatory Visit: Payer: Self-pay | Admitting: Family Medicine

## 2024-06-09 LAB — CMP14+EGFR
ALT: 21 IU/L (ref 0–32)
AST: 23 IU/L (ref 0–40)
Albumin: 3.9 g/dL (ref 3.9–4.9)
Alkaline Phosphatase: 95 IU/L (ref 41–116)
BUN/Creatinine Ratio: 33 — ABNORMAL HIGH (ref 9–23)
BUN: 7 mg/dL (ref 6–20)
Bilirubin Total: 0.3 mg/dL (ref 0.0–1.2)
CO2: 23 mmol/L (ref 20–29)
Calcium: 8.8 mg/dL (ref 8.7–10.2)
Chloride: 101 mmol/L (ref 96–106)
Creatinine, Ser: 0.21 mg/dL — ABNORMAL LOW (ref 0.57–1.00)
Globulin, Total: 3.4 g/dL (ref 1.5–4.5)
Glucose: 130 mg/dL — ABNORMAL HIGH (ref 70–99)
Potassium: 5.3 mmol/L — ABNORMAL HIGH (ref 3.5–5.2)
Sodium: 138 mmol/L (ref 134–144)
Total Protein: 7.3 g/dL (ref 6.0–8.5)
eGFR: 157 mL/min/1.73 (ref >59)

## 2024-06-09 LAB — CBC WITH DIFFERENTIAL/PLATELET
Basophils Absolute: 0.1 x10E3/uL (ref 0.0–0.2)
Basos: 1 %
EOS (ABSOLUTE): 0.4 x10E3/uL (ref 0.0–0.4)
Eos: 4 %
Hematocrit: 42.4 % (ref 34.0–46.6)
Hemoglobin: 13.5 g/dL (ref 11.1–15.9)
Immature Grans (Abs): 0 x10E3/uL (ref 0.0–0.1)
Immature Granulocytes: 0 %
Lymphocytes Absolute: 3.9 x10E3/uL — ABNORMAL HIGH (ref 0.7–3.1)
Lymphs: 38 %
MCH: 27.2 pg (ref 26.6–33.0)
MCHC: 31.8 g/dL (ref 31.5–35.7)
MCV: 86 fL (ref 79–97)
Monocytes Absolute: 0.6 x10E3/uL (ref 0.1–0.9)
Monocytes: 6 %
Neutrophils Absolute: 5.2 x10E3/uL (ref 1.4–7.0)
Neutrophils: 51 %
Platelets: 236 x10E3/uL (ref 150–450)
RBC: 4.96 x10E6/uL (ref 3.77–5.28)
RDW: 13.7 % (ref 11.7–15.4)
WBC: 10.2 x10E3/uL (ref 3.4–10.8)

## 2024-06-09 LAB — LIPID PANEL
Chol/HDL Ratio: 4.3 ratio (ref 0.0–4.4)
Cholesterol, Total: 121 mg/dL (ref 100–199)
HDL: 28 mg/dL — ABNORMAL LOW (ref >39)
LDL Chol Calc (NIH): 78 mg/dL (ref 0–99)
Triglycerides: 72 mg/dL (ref 0–149)
VLDL Cholesterol Cal: 15 mg/dL (ref 5–40)

## 2024-06-09 LAB — HEMOGLOBIN A1C
Est. average glucose Bld gHb Est-mCnc: 146 mg/dL
Hgb A1c MFr Bld: 6.7 % — ABNORMAL HIGH (ref 4.8–5.6)

## 2024-06-15 NOTE — Progress Notes (Signed)
 [facility]  Pulmonary Medicine Outpatient Return Telehealth Visit   Location Information: Patient State (at time of visit): [place]  Patient Location (at time of visit):Home/Other Non-Medical  Provider Location: Non-Provider-Based Clinic (Clinic, non-hospital) Is provider licensed to provide clinical care in the current location/state of the patient? Yes   Consent:  Patient's identity was confirmed. Presenting condition or illness was discussed with the patient/personal representative. Current proposed treatment for presenting condition or illness was explained to patient/personal representative along with the likely benefits and any significant risks or complications associated with the provision of treatment by audio/video means. The patient/personal representative verbally authorized treatment to be provided by audio/video, which may include a limited review of patient's current health status, medication, or other treatment recommendations, patient education, and an opportunity to ask questions about condition and treatment. Verbal Consent Granted by Patient/Personal Representative:Yes   Visit Information: Modality: 2-Way Real-Time Audio/Video  Video Start Time: 0840 Video Stop Time: 0855 Video Total Time: 15 min   1. Type II spinal muscular atrophy    (CMD)  [facility] Wake-Pulmonary Function Test Standard PFT (Spirometry, Lung Volumes, Carbon Monoxide Diffusing Capacity)    2. Restrictive lung disease  [facility] Wake-Pulmonary Function Test Standard PFT (Spirometry, Lung Volumes, Carbon Monoxide Diffusing Capacity)    3. Excessive daytime sleepiness  W[facility] Only-Adult Home Sleep Apnea Test Order (Non-Sleep Provider)     Assessment & Plan 1. Asthma. Asthma is currently well-managed. Symbicort  should be taken twice daily before the test and on the morning of the test to ensure optimal results. A prescription for Symbicort  has been renewed and sent to the pharmacy.  Spirometry lung volumes and diffusion capacity tests will be conducted at the hospital. The patient should perform the test seated upright for consistency. Albuterol  is not required for the test as the asthma diagnosis has already been established.  2. Assess for sleep apnea. A home sleep test will be conducted to assess for sleep apnea. The process involves submitting a request, after which the patient will be contacted to arrange the delivery of the equipment. The instructions are straightforward, and the patient should have no difficulty completing the test. The results will indicate whether apneas or hypopneas are present, and if oxygen  levels are dropping during sleep. If the test reveals any issues, further steps will be discussed. The test will help determine if there is a need for interventions such as BiPAP or supplemental oxygen  during sleep.  Follow-up to be determined based on HST report. If OSA found or hypoxia will probably pursue an in lab titration. She would then need a compliance visit.     Office phone: [phone] Office Fax: [phone]  Below is the documentation for today's visit:  Prior history: Janet Acevedo is a 33 y.o. year old adult. The primary encounter diagnosis was Type II spinal muscular atrophy    (CMD). Diagnoses of Restrictive lung disease and Excessive daytime sleepiness were also pertinent to this visit.  History of Present Illness The patient is a gender-fluid individual who presents via virtual visit for evaluation of asthma and sleep apnea.   Janet Acevedo is a 33 y.o. adult (gender fluid, female sex, she/her pronouns) who presents for follow-up for SMA. Diagnosed with Type 2 SMA at 50 months old; she is not sure how.  In 2022 had genetic testing that confirmed SMA and showed 3 SMN2 copies. Currently on evrisdi.   She has initiated a new treatment regimen since her last consultation, which includes an  infusion muscle-targeted therapy  (Apitegromab ) as part of a research study. She is currently awaiting FDA approval for this treatment and has received five infusions so far. She utilizes an inhaler on a regular basis but reports occasional non-compliance with the prescribed dosage. She is seeking a refill of her Symbicort  prescription.  In February 2025, she was hospitalized for two days due to influenza and pneumonia, during which she required supplemental oxygen . She was able to discontinue the supplemental oxygen  upon discharge.  Her sleep pattern is generally satisfactory, although she tends to stay up late most nights. She often feels the need for a daytime nap and is interested in undergoing a home sleep test. She is also considering repeating her pulmonary function tests (PFTs) due to difficulties encountered during previous attempts, including issues with wheelchair accessibility and maintaining balance while performing the test.  Previous Report Reviewed: office notes and radiology reports   The following portions of the patient's history were reviewed and updated as appropriate: allergies, current medications, and problem list.  DME: none   Tobacco status: Janet Acevedo has never smoked.  Review of Systems A complete ROS was performed with pertinent positives/negatives noted in the HPI. The remainder of the ROS are negative.   Past History:   Active Ambulatory Problems    Diagnosis Date Noted  . Asthma 11/18/2011  . Restrictive lung disease 11/18/2011  . Type II spinal muscular atrophy    (CMD) 11/18/2011  . History of seasonal allergies 01/11/2013  . Elevated blood pressure reading 01/11/2013  . Delayed sleep phase syndrome 11/22/2013  . Ureteropelvic junction obstruction 07/17/2014  . Gross hematuria 07/17/2014  . Epigastric pain 06/03/2015  . Abnormal ultrasound of liver 06/03/2015  . Cardiomegaly 11/20/2016  . Dysphagia 08/24/2017   Resolved Ambulatory Problems    Diagnosis Date Noted  . No Resolved  Ambulatory Problems   Past Medical History:  Diagnosis Date  . Abscess of back   . Acanthosis nigricans   . Allergic rhinitis   . Esophageal reflux   . Progressive muscular atrophy    (CMD)   . Scoliosis associated with other condition   . Spinal muscular atrophy type II    (CMD) 11/18/2011  . Vitamin D  deficiency    Family History[1] Social History Social History[2] Social History   Social History Narrative  . Not on file     Medications:   Current Medications[3]    Physical Exam   Diagnostic Review:   I personally reviewed the following:  Results     PFTs: FVC Pre-Actual  Date Value Ref Range Status  05/31/2024 1.24 L   09/08/2022 1.18 L   08/19/2021 1.14 L   11/21/2020 1.23 L   05/07/2020 1.12 L    FEV1 Pre-Actual  Date Value Ref Range Status  05/31/2024 1.20 L   09/08/2022 1.14 L   08/19/2021 1.10 L   11/21/2020 1.18 L   05/07/2020 1.04 L    FEV1FVC Pre-Actual  Date Value Ref Range Status  05/31/2024 97 %   09/08/2022 97 %   08/19/2021 96 %   11/21/2020 96 %   05/07/2020 93 %    FEV1FVC LLN  Date Value Ref Range Status  05/31/2024 74 %   09/08/2022 75 %   08/19/2021 75 %   11/21/2020 74 %   05/07/2020 74 %    TLCN2 Pre-Actual  Date Value Ref Range Status  05/31/2024 1.72 L   09/08/2022 2.06 L   08/19/2021 1.60 L   11/15/2017  1.92 L    DLCOUNC Pre-Actual  Date Value Ref Range Status  05/31/2024 12.28 ml-min-mmHg   09/08/2022 14.97 ml-min-mmHg   08/19/2021 12.91 ml-min-mmHg   11/21/2020 13.77 ml-min-mmHg   11/15/2017 14.75 ml-min-mmHg      Hospital Outpatient Visit on 05/31/2024  Component Date Value Ref Range Status  . FVC Pre-Actual 05/31/2024 1.24  L Final  . FVC %Pred-Pre 05/31/2024 32.9  % Final  . FVC LLN 05/31/2024 3.06  L Final  . FVC-ULN 05/31/2024 4.52  L Final  . FVC Z Score Pre 05/31/2024 -6.171  L Final  . FEV1 Pre-Actual 05/31/2024 1.20  L Final  . FEV1 %Pred-Pre 05/31/2024 37.6  % Final  . FEV1 LLN  05/31/2024 2.55  L Final  . FEV1-ULN 05/31/2024 3.81  L Final  . FEV1 Z Score Pre 05/31/2024 -4.893  L Final  . FEV1FVC Pre-Actual 05/31/2024 97  % Final  . FEV1FVC LLN 05/31/2024 74  % Final  . FEV1/FVC Z Score Pre 05/31/2024 2.335  % Final  . FEF 25-75% Pre  05/31/2024 3.06  L-sec Final  . FEF 25-75% Pred Pre  05/31/2024 87.9  % Final  . FEF 25-75% LLN 05/31/2024 2.21  L-sec Final  . FEF 25-75% Z Score Pre 05/31/2024 -0.514  L-sec Final  . SVC Pre-Actual 05/31/2024 1.54  L Final  . SVC %Pred-Pre 05/31/2024 35.6  % Final  . SVC LLN 05/31/2024 3.45  L Final  . RVN2 %Pred-Pre 05/31/2024 14.1  % Final  . RVN2-LLN 05/31/2024 0.68  L Final  . RVN2-ULN 05/31/2024 2.14  L Final  . TLCN2 Pre-Actual 05/31/2024 1.72  L Final  . TLCN2 %Pred-Pre 05/31/2024 30.6  % Final  . TLCN2 LLN 05/31/2024 4.58  L Final  . RVTLCN2-LLN 05/31/2024 13  % Final  . RVTLCN2-ULN 05/31/2024 34  % Final  . DLCOUNC Pre-Actual 05/31/2024 12.28  ml-min-mmHg Final  . DLCOUNC %Pred-Pre 05/31/2024 52.7  % Final  . DLCOUNC LLN 05/31/2024 18.53  ml-min-mmHg Final  . DLCOunc-ULN 05/31/2024 28.90  ml-min-mmHg Final  . DLCO/VA % Predicted-Pre 05/31/2024 165.1  % Final  . FEV1SVC Pre-Actual 05/31/2024 78  % Final  . PFCONSULT 05/31/2024 Spirometry with lung volumes demonstrates severe restrictive impairment.   Final  . PFCONSULT 05/31/2024 Simple restriction is present.   Final  . PFCONSULT 05/31/2024 Diffusing capacity, a measure of alveolar capillary gas exchange, is severely reduced. This was not corrected for the patient s hemoglobin concentration.   Final  . PFCONSULT 05/31/2024 The flow-volume loop contour suggests restriction.   Final  . PFCONSULT 05/31/2024 Initial Review by PP   Final  . PFCONSULT 05/31/2024 This interpretation has been electronically signed   Marnee Thresa BRAVO 06-01-2024  10 42 09 AM   Final    I independently reviewed the diagnostic images mentioned above.  Representative images may be found  below.       [1] Family History Problem Relation Name Age of Onset  . Asthma Neg Hx    [2] Social History Tobacco Use  . Smoking status: Never  . Smokeless tobacco: Never  Substance Use Topics  . Alcohol use: No  . Drug use: No  [3]  Current Outpatient Medications:  .  albuterol  1.25 mg/3 mL nebulizer solution, 1.25 mg as needed., Disp: , Rfl:  .  albuterol  HFA (PROVENTIL  HFA;VENTOLIN  HFA;PROAIR  HFA) 90 mcg/actuation inhaler, INHALE 2 PUFFS INTO THE LUNGS EVERY 4 (FOUR) HOURS AS NEEDED FOR WHEEZING OR SHORTNESS OF BREATH., Disp: , Rfl:  .  betamethasone  dipropionate (DIPROLENE ) 0.05 % cream, as needed., Disp: , Rfl:  .  budesonide -formoteroL  (Symbicort ) 160-4.5 mcg/actuation inhaler, TAKE 2 PUFFS BY MOUTH TWICE A DAY, Disp: , Rfl:  .  budesonide -formoteroL  (Symbicort ) 160-4.5 mcg/actuation inhaler, Inhale 2 puffs 2 (two) times a day., Disp: 1 each, Rfl: 11 .  busPIRone  (BUSPAR ) 7.5 mg tablet, Take 1 tablet by mouth 3 (three) times a day., Disp: , Rfl:  .  cromolyn  (OPTICROM ) 4 % drop, PLACE 1 DROP INTO BOTH EYES FOUR TIMES DAILY, Disp: , Rfl:  .  fluocinolone  0.01 % oil, Apply  topically., Disp: , Rfl:  .  hydrocortisone  2.5 % ointment, , Disp: , Rfl:  .  Jardiance  10 mg tab, Take 1 tablet by mouth Once Daily., Disp: , Rfl:  .  ketoconazole  (NIZORAL ) 2 % cream, Apply 1 application topically as needed. dermatitis, Disp: , Rfl:  .  levocetirizine (XYZAL ) 5 mg tablet, TAKE 1 TABLET (5 MG TOTAL) BY MOUTH EVERY EVENING., Disp: , Rfl:  .  levonorgestreL (Mirena) 21 mcg/24 hours (8 yrs) 52 mg IUD, 1 each by intrauterine route once., Disp: , Rfl:  .  metoprolol  succinate (TOPROL  XL) 25 mg 24 hr tablet, Take 25 mg by mouth Once Daily., Disp: , Rfl:  .  MISCELLANEOUS MEDICATION/PRODUCT, Please provide a Respironics cough assist deviceMode: AutoCough trak: On Max pressure: 0-70 cmH20 Inhale Flow: High Inhale time: 0-5 seconds Max negative pressure:  0-70 cmH20 Exhale time: 0-5 seconds Pause  time: 2 seconds    [employer] [phone], Disp: 1 each, Rfl: 99 .  montelukast  (SINGULAIR ) 10 mg tablet, Take 10 mg by mouth nightly., Disp: , Rfl:  .  nystatin  (MYCOSTATIN ) 100,000 unit/gram powder, APPLY TWICE DAILY TO AFFECTED AREA AS NEEDED, Disp: , Rfl:  .  olmesartan  (BENICAR ) 20 mg tablet, Take 20 mg by mouth Once Daily., Disp: , Rfl:  .  omeprazole  (PriLOSEC) 20 mg DR capsule, THE PATIENT HAS TROUBLE SWALLOWING. PLEASE TAKE 2 CAPSULES TWICE A DAY BY MOUTH, Disp: 360 capsule, Rfl: 3 .  risdiplam (Evrysdi) 5 mg tab, Take 1 tablet (5 mg total) by mouth daily., Disp: 90 tablet, Rfl: 3 .  rosuvastatin  (CRESTOR ) 5 mg tablet, Take 5 mg by mouth Once Daily., Disp: , Rfl:  .  sertraline  (ZOLOFT ) 50 mg tablet, Take 50 mg by mouth nightly., Disp: , Rfl:

## 2024-06-19 DIAGNOSIS — R55 Syncope and collapse: Secondary | ICD-10-CM | POA: Insufficient documentation

## 2024-06-19 DIAGNOSIS — Z23 Encounter for immunization: Secondary | ICD-10-CM | POA: Insufficient documentation

## 2024-06-19 DIAGNOSIS — F321 Major depressive disorder, single episode, moderate: Secondary | ICD-10-CM | POA: Insufficient documentation

## 2024-06-19 NOTE — Assessment & Plan Note (Signed)
 Controlled, no change in medication

## 2024-06-19 NOTE — Assessment & Plan Note (Signed)
 After obtaining informed consent, the influenza vaccine is  administered , with no adverse effect noted at the time of administration.

## 2024-06-19 NOTE — Assessment & Plan Note (Signed)
 Hyperlipidemia:Low fat diet discussed and encouraged.   Lipid Panel  Lab Results  Component Value Date   CHOL 121 06/08/2024   HDL 28 (L) 06/08/2024   LDLCALC 78 06/08/2024   TRIG 72 06/08/2024   CHOLHDL 4.3 06/08/2024     No med change

## 2024-06-19 NOTE — Assessment & Plan Note (Signed)
 Diabetes associated with hypertension, hyperlipidemia, and obesity    [place] is reminded of  the need to limit carbohydrate intake to 30 to 60 grams per meal to help with blood sugar control.   The need to take medication as prescribed, test blood sugar as directed, and to call between visits if there is a concern that blood sugar is uncontrolled is also discussed.     [place] is reminded of the importance of daily foot exam, annual eye examination, and good blood sugar, blood pressure and cholesterol control.     Latest Ref Rng & Units 06/08/2024   11:48 AM 01/06/2024    2:09 PM 10/26/2023    4:29 PM 10/01/2023   12:30 PM 10/01/2023   12:17 PM  Diabetic Labs  HbA1c 4.8 - 5.6 % 6.7  6.6      Micro/Creat Ratio 0 - 29 mg/g creat    25    Chol 100 - 199 mg/dL 878     869   HDL >60 mg/dL 28     30   Calc LDL 0 - 99 mg/dL 78     84   Triglycerides 0 - 149 mg/dL 72     81   Creatinine 0.57 - 1.00 mg/dL 9.78  9.84  <9.69   9.83       06/08/2024   11:16 AM 01/06/2024    1:21 PM 11/02/2023   10:32 AM 10/26/2023    3:03 PM 10/06/2023    2:50 PM 10/04/2023    8:12 AM 09/16/2023    4:22 AM  BP/Weight  Systolic BP 115 103  120 -- 127 137  Diastolic BP 79 79  68 -- 88 91  Wt. (Lbs)   175  175    BMI   27.41 kg/m2  27.41 kg/m2        Latest Ref Rng & Units 09/18/2022   12:00 AM  Foot/eye exam completion dates  Eye Exam No Retinopathy No Retinopathy         This result is from an external source.    Less well controlled needs to follow diet more closely

## 2024-06-19 NOTE — Assessment & Plan Note (Signed)
 Evaluated by Cardiology in 07/2023 with 1 year follow up , no recurrences . Wishes to establish with local Provider , will request appt be changed to Cardiology in [facility]

## 2024-06-19 NOTE — Assessment & Plan Note (Signed)
 Will refer to local cardiology to folow

## 2024-06-19 NOTE — Progress Notes (Signed)
 Janet Acevedo     MRN: [id number]      DOB: 07/31/1990  Chief Complaint  Patient presents with   Medical Management of Chronic Issues    Follow up     HPI   Janet Acevedo is here for follow up and re-evaluation of chronic medical conditions, medication management and review of any available recent lab and radiology data.  Preventive health is updated, specifically  Cancer screening and Immunization.   Requests transfer of care to local Cardiologist   ROS Denies recent fever or chills. Denies sinus pressure, nasal congestion, ear pain or sore throat. Denies chest congestion, productive cough or wheezing. Denies chest pains, palpitations and leg swelling Denies abdominal pain, nausea, vomiting,diarrhea or constipation.   Denies dysuria, frequency, hesitancy or incontinence.  Denies headaches, seizures, numbness, or tingling. Denies uncontrolled  depression, anxiety or insomnia. Denies skin break down or rash.   PE  BP 115/79   Pulse 76   SpO2 (!) 18%   PF 98 L/min   Patient alert and oriented and in no cardiopulmonary distress.  HEENT: No facial asymmetry, EOMI,     Neck decreased ROM  Chest: Clear to auscultation bilaterally.  CVS: S1, S2 no murmurs, no S3.Regular rate.  ABD: Soft non tender.   Ext: No edema  MS: decreased  ROM spine, shoulders, hips and knees.  Skin: Intact, no ulcerations or rash noted.  Psych: Good eye contact, normal affect. Memory intact not anxious or depressed appearing.  CNS: CN 2-12 intact, power,  normal throughout.no focal deficits noted.   Assessment & Plan  Hypertension Controlled, no change in medication   Vasovagal syncope Evaluated by Cardiology in 07/2023 with 1 year follow up , no recurrences . Wishes to establish with local Provider , will request appt be changed to Cardiology in [facility]  Prolonged QT interval Will refer to local cardiology to folow  Influenza vaccination administered at current visit After  obtaining informed consent, the influenza vaccine is  administered , with no adverse effect noted at the time of administration.   Type 2 diabetes mellitus with other specified complication (HCC) Diabetes associated with hypertension, hyperlipidemia, and obesity    [name] is reminded of  the need to limit carbohydrate intake to 30 to 60 grams per meal to help with blood sugar control.   The need to take medication as prescribed, test blood sugar as directed, and to call between visits if there is a concern that blood sugar is uncontrolled is also discussed.     [name] is reminded of the importance of daily foot exam, annual eye examination, and good blood sugar, blood pressure and cholesterol control.     Latest Ref Rng & Units 06/08/2024   11:48 AM 01/06/2024    2:09 PM 10/26/2023    4:29 PM 10/01/2023   12:30 PM 10/01/2023   12:17 PM  Diabetic Labs  HbA1c 4.8 - 5.6 % 6.7  6.6      Micro/Creat Ratio 0 - 29 mg/g creat    25    Chol 100 - 199 mg/dL 878     869   HDL >60 mg/dL 28     30   Calc LDL 0 - 99 mg/dL 78     84   Triglycerides 0 - 149 mg/dL 72     81   Creatinine 0.57 - 1.00 mg/dL 9.78  9.84  <9.69   9.83       06/08/2024   11:16 AM 01/06/2024  1:21 PM 11/02/2023   10:32 AM 10/26/2023    3:03 PM 10/06/2023    2:50 PM 10/04/2023    8:12 AM 09/16/2023    4:22 AM  BP/Weight  Systolic BP 115 103  120 -- 127 137  Diastolic BP 79 79  68 -- 88 91  Wt. (Lbs)   175  175    BMI   27.41 kg/m2  27.41 kg/m2        Latest Ref Rng & Units 09/18/2022   12:00 AM  Foot/eye exam completion dates  Eye Exam No Retinopathy No Retinopathy         This result is from an external source.    Less well controlled needs to follow diet more closely    Dyslipidemia Hyperlipidemia:Low fat diet discussed and encouraged.   Lipid Panel  Lab Results  Component Value Date   CHOL 121 06/08/2024   HDL 28 (L) 06/08/2024   LDLCALC 78 06/08/2024   TRIG 72 06/08/2024   CHOLHDL 4.3 06/08/2024      No med change  Moderate major depression (HCC) Controlled, no change in medication   GAD (generalized anxiety disorder) Controlled, no change in medication

## 2024-06-23 ENCOUNTER — Encounter: Payer: Self-pay | Admitting: Family Medicine

## 2024-06-23 DIAGNOSIS — R7989 Other specified abnormal findings of blood chemistry: Secondary | ICD-10-CM

## 2024-07-05 ENCOUNTER — Other Ambulatory Visit: Payer: Self-pay | Admitting: Family Medicine

## 2024-07-07 ENCOUNTER — Other Ambulatory Visit: Payer: Self-pay | Admitting: Family Medicine

## 2024-07-11 ENCOUNTER — Other Ambulatory Visit: Payer: Self-pay | Admitting: Family Medicine

## 2024-08-15 ENCOUNTER — Ambulatory Visit: Admitting: Cardiology

## 2024-10-06 ENCOUNTER — Ambulatory Visit: Admitting: Family Medicine

## 2024-10-10 ENCOUNTER — Ambulatory Visit

## 2024-11-21 ENCOUNTER — Ambulatory Visit: Admitting: Cardiology
